# Patient Record
Sex: Female | Born: 1960 | Race: White | Hispanic: No | Marital: Married | Smoking: Former smoker
Health system: Southern US, Community
[De-identification: ages and names within clinical notes are randomized; demographics above are authoritative.]

## PROBLEM LIST (undated history)

## (undated) DIAGNOSIS — N390 Urinary tract infection, site not specified: Secondary | ICD-10-CM

## (undated) DIAGNOSIS — I639 Cerebral infarction, unspecified: Secondary | ICD-10-CM

## (undated) DIAGNOSIS — E559 Vitamin D deficiency, unspecified: Secondary | ICD-10-CM

## (undated) DIAGNOSIS — E1165 Type 2 diabetes mellitus with hyperglycemia: Secondary | ICD-10-CM

## (undated) DIAGNOSIS — I10 Essential (primary) hypertension: Secondary | ICD-10-CM

## (undated) DIAGNOSIS — E119 Type 2 diabetes mellitus without complications: Secondary | ICD-10-CM

## (undated) DIAGNOSIS — E785 Hyperlipidemia, unspecified: Secondary | ICD-10-CM

## (undated) DIAGNOSIS — I679 Cerebrovascular disease, unspecified: Secondary | ICD-10-CM

## (undated) HISTORY — PX: CHOLECYSTECTOMY: SHX55

## (undated) HISTORY — DX: Vitamin D deficiency, unspecified: E55.9

## (undated) HISTORY — DX: Type 2 diabetes mellitus without complications: E11.9

## (undated) HISTORY — DX: Cerebral infarction, unspecified: I63.9

---
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
## 2000-08-30 ENCOUNTER — Encounter: Admission: RE | Admit: 2000-08-30 | Discharge: 2000-11-28 | Payer: Self-pay

## 2005-07-26 DIAGNOSIS — Z8673 Personal history of transient ischemic attack (TIA), and cerebral infarction without residual deficits: Secondary | ICD-10-CM

## 2005-07-26 HISTORY — DX: Personal history of transient ischemic attack (TIA), and cerebral infarction without residual deficits: Z86.73

## 2005-09-27 ENCOUNTER — Ambulatory Visit: Payer: Self-pay | Admitting: Cardiology

## 2005-10-01 ENCOUNTER — Inpatient Hospital Stay (HOSPITAL_COMMUNITY)
Admission: RE | Admit: 2005-10-01 | Discharge: 2005-10-20 | Payer: Self-pay | Admitting: Physical Medicine & Rehabilitation

## 2005-10-01 ENCOUNTER — Ambulatory Visit: Payer: Self-pay | Admitting: Physical Medicine & Rehabilitation

## 2005-10-25 ENCOUNTER — Encounter (HOSPITAL_COMMUNITY)
Admission: RE | Admit: 2005-10-25 | Discharge: 2005-11-24 | Payer: Self-pay | Admitting: Physical Medicine & Rehabilitation

## 2005-11-19 ENCOUNTER — Encounter
Admission: RE | Admit: 2005-11-19 | Discharge: 2006-02-17 | Payer: Self-pay | Admitting: Physical Medicine & Rehabilitation

## 2005-11-19 ENCOUNTER — Ambulatory Visit: Payer: Self-pay | Admitting: Physical Medicine & Rehabilitation

## 2005-11-25 ENCOUNTER — Encounter (HOSPITAL_COMMUNITY)
Admission: RE | Admit: 2005-11-25 | Discharge: 2005-12-25 | Payer: Self-pay | Admitting: Physical Medicine & Rehabilitation

## 2005-12-29 ENCOUNTER — Encounter (HOSPITAL_COMMUNITY)
Admission: RE | Admit: 2005-12-29 | Discharge: 2006-01-28 | Payer: Self-pay | Admitting: Physical Medicine & Rehabilitation

## 2006-01-20 ENCOUNTER — Ambulatory Visit: Payer: Self-pay | Admitting: Physical Medicine & Rehabilitation

## 2006-02-01 ENCOUNTER — Encounter (HOSPITAL_COMMUNITY)
Admission: RE | Admit: 2006-02-01 | Discharge: 2006-03-03 | Payer: Self-pay | Admitting: Physical Medicine & Rehabilitation

## 2006-02-21 ENCOUNTER — Encounter
Admission: RE | Admit: 2006-02-21 | Discharge: 2006-02-21 | Payer: Self-pay | Admitting: Physical Medicine & Rehabilitation

## 2007-02-02 ENCOUNTER — Ambulatory Visit (HOSPITAL_COMMUNITY): Admission: RE | Admit: 2007-02-02 | Discharge: 2007-02-02 | Payer: Self-pay | Admitting: Orthopaedic Surgery

## 2007-02-06 ENCOUNTER — Encounter (HOSPITAL_COMMUNITY): Admission: RE | Admit: 2007-02-06 | Discharge: 2007-03-08 | Payer: Self-pay | Admitting: Orthopaedic Surgery

## 2007-03-21 ENCOUNTER — Encounter (HOSPITAL_COMMUNITY): Admission: RE | Admit: 2007-03-21 | Discharge: 2007-04-20 | Payer: Self-pay | Admitting: Orthopaedic Surgery

## 2007-09-21 ENCOUNTER — Ambulatory Visit: Payer: Self-pay | Admitting: Internal Medicine

## 2007-09-28 ENCOUNTER — Ambulatory Visit: Payer: Self-pay | Admitting: Internal Medicine

## 2007-09-28 ENCOUNTER — Ambulatory Visit (HOSPITAL_COMMUNITY): Admission: RE | Admit: 2007-09-28 | Discharge: 2007-09-28 | Payer: Self-pay | Admitting: Internal Medicine

## 2007-12-08 ENCOUNTER — Encounter
Admission: RE | Admit: 2007-12-08 | Discharge: 2008-03-07 | Payer: Self-pay | Admitting: Physical Medicine & Rehabilitation

## 2007-12-12 ENCOUNTER — Ambulatory Visit: Payer: Self-pay | Admitting: Physical Medicine & Rehabilitation

## 2008-01-15 ENCOUNTER — Ambulatory Visit: Payer: Self-pay | Admitting: Physical Medicine & Rehabilitation

## 2009-08-29 ENCOUNTER — Ambulatory Visit (HOSPITAL_COMMUNITY): Payer: Self-pay | Admitting: Oncology

## 2009-08-29 ENCOUNTER — Encounter (HOSPITAL_COMMUNITY): Admission: RE | Admit: 2009-08-29 | Discharge: 2009-09-28 | Payer: Self-pay | Admitting: Oncology

## 2009-09-02 ENCOUNTER — Ambulatory Visit (HOSPITAL_COMMUNITY): Admission: RE | Admit: 2009-09-02 | Discharge: 2009-09-02 | Payer: Self-pay | Admitting: Oncology

## 2010-10-15 LAB — IRON AND TIBC
Iron: 16 ug/dL — ABNORMAL LOW (ref 42–135)
UIBC: 414 ug/dL

## 2010-10-15 LAB — FERRITIN: Ferritin: 6 ng/mL — ABNORMAL LOW (ref 10–291)

## 2010-10-15 LAB — CBC: Platelets: 460 10*3/uL — ABNORMAL HIGH (ref 150–400)

## 2010-12-08 NOTE — Procedures (Signed)
Melanie Spencer, Melanie Spencer                  ACCOUNT NO.:  [id number]   MEDICAL RECORD NO.:  [id number]          PATIENT TYPE:  REC   LOCATION:  [facility]                         FACILITY:  [facility]   PHYSICIAN:  Erick Colace, M.D.DATE OF BIRTH:  October 09, 1960   DATE OF PROCEDURE:  DATE OF DISCHARGE:                               OPERATIVE REPORT   PROCEDURE:  Botulinum toxin injection, left pectoralis and left forearm  flexor muscles.   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   Spasticity only partially responsive to physical therapy and  medications.   DESCRIPTION OF PROCEDURE:  Informed consent was obtained after  describing risks and benefits of the procedure to the patient.  These  include bleeding, bruising, and infection.  She elected to proceed and  has given written consent.  The patient placed on exam table from a  reclined posture.  Nursing assistance, area over the left pectoralis.  Lateral aspect was marked and prepped with Betadine, entered with a 15-  mm needle electrode under EMG guidance.  Positive EMG activity obtained  followed by injection of 25 units of botulinum toxin into each of 5  sites.  Then, the right forearm was marked and prepped with Betadine,  entered with the 15-mm 26-gauge needle electrode and 25 units were  injected into each of 2 sites in the flexor carpi radialis, 3 sites in  the flexor digitorum sublimis and 2 sites in the flexor digitorum  profundus.  The patient tolerated the procedure well.  Post-injection  instructions given.   Return in 1 month for followup visit.  We will order OT for edema  reduction in left upper extremity as well as scapular mobilization of  the left shoulder.      Erick Colace, M.D.  Electronically Signed     AEK/MEDQ  D:  01/15/2008 13:35:24  T:  01/16/2008 03:56:02  Job:  [id number]

## 2010-12-08 NOTE — Assessment & Plan Note (Signed)
This is a 50 year old female with a prior history of diabetes and  tobacco abuse, admitted to [facility] on September 25, 2005 with acute  onset of left-sided weakness.  MRI showed a right MCA distribution  infarction.  Echocardiogram showed normal ejection fraction, no source  of embolism.  Carotid Dopplers show no significant stenosis.  She was  hospitalized at [facility] inpatient rehabilitation from October 01, 2005  through October 20, 2005.  She had her last visit with me January 24, 2006, at  which time we were doing some pre-driver assessment, and she was to  follow up with me in 4 months.  She unfortunately had lost her insurance  and therefore did not make her follow up visits in the interval time.  She has seen Dr. Hilda Lias from orthopedics who got her an air cast for  her left ankle instability; however, she continues to have some foot  drop with this, and has had some falls.  In addition, she has had some  left shoulder pain, mainly when she put a 5 pound weight in her hand and  tries to move her arm around.  She has had problems with left hand  swelling since her stroke, and this has really not improved much.  She  has finished out her PT and OT over a year ago.   Her left shoulder has about 2/10 pain.  She can walk 30-40 minutes at  time but slowly, and uses a cane if she is by herself or outside the  house.  She cannot walk on uneven surfaces.  She can climb steps with  rails very slowly.  She does drive, only daytime hours.   She needs some assistance with dressing, meal preparation and household  duties.   REVIEW OF SYSTEMS:  Positive for numbness and tingling in her feet and  limb swelling.   MEDICATIONS:  1. Gabapentin.  2. Plavix.  3. Iron.  4. Prilosec.  5. Actos.  6. Glimepiride.  7. Lovastatin.  8. Baby aspirin.  9. Altace.   SOCIAL HISTORY:  Married.  Accompanied by her husband.  She has applied  for disability; has not received it yet.   PHYSICAL  EXAMINATION:  VITAL SIGNS:  Her blood pressure is 161/71, pulse  78, respirations 20, O2 saturation 98% on room air.  GENERAL:  An obese female in no acute distress.  NEUROLOGIC:  She has lower facial droop on the left side, left central  seventh.  Her motor strength is 2- at the deltoid, 2- at the biceps.  She really works in a Investment banker, corporate pattern with some shoulder  retractions.  She has no active movement at the finger or wrist.  She  does have 2+ dorsal swelling of the hand and fingers.  In the lower  extremities, she has 3- hip flexion, knee extension and ankle  dorsiflexion, as well as plantar flexion.  Her ankle range of motion is  reduced.  Her gait is a steppage type gait with hip hiking.  Of note,  she does have some toe drag noted.   Her Ashworth scale is 3 in the finger and wrist flexors of the left  hand, as well as at the left biceps and left pectoralis.  She has some  clonus at the left ankle, but no contracture.  She has no contracture at  the hip or knee.  The right side has 5/5 strength with normal joint  range of motion.  Her sensation is intact  bilaterally to light touch.  Her left upper extremity has pain with passive overhead movement, but  only at end range.   IMPRESSION:  1. Left spastic hemiplegia due to cerebrovascular accident.  She has      spasticity affecting the left upper extremity at the finger and      wrist flexors, as well as the biceps as well as the left      pectoralis.  This is contributing to reduced range of motion,      abnormal gait pattern, where hand, wrist and arm are in the flexed      pattern during ambulation.  This also contributes to poor balance.  2. Hand swelling secondary to weakness and dependent positions,      instructed in retrograde massage.  3. Left foot drop due to her dorsiflexor weakness.   PLAN:  1. Will set her up for botulinum toxin injection, finger and wrist      flexors, as well as pectoralis on the left side.   At a later date,      will do left muscutaneous nerve block with phenol.  2. Will refer for left AFO at [facility] evaluation for custom versus off-      the-shelf model.  3. Consider functional electrical stimulation of the left upper      extremity, shoulder.  4. May need OT for edema reduction techniques, left upper extremity.   I will see her back for the injections.  Will need to get pre-approval.  This was discussed with both the patient and her husband.      Erick Colace, M.D.  Electronically Signed     AEK/MedQ  D:  12/12/2007 16:44:19  T:  12/12/2007 18:13:09  Job #:  [id number]   cc:   Corrie Mckusick, M.D.  Fax: [phone]   Melony Overly, PA   J. Darreld Mclean, M.D.  Fax: [phone]

## 2010-12-08 NOTE — Consult Note (Signed)
Melanie Spencer, Melanie Spencer                  ACCOUNT NO.:  [id number]   MEDICAL RECORD NO.:  [id number]          PATIENT TYPE:  AMB   LOCATION:  DAY                           FACILITY:  [facility]   PHYSICIAN:  R. Roetta Sessions, M.D. DATE OF BIRTH:  1960/10/02   DATE OF CONSULTATION:  DATE OF DISCHARGE:                                 CONSULTATION   REFERRING PHYSICIAN:  Patrica Duel, M.D.   REASON FOR CONSULTATION:  Iron-deficiency anemia.   HISTORY OF PRESENT ILLNESS:  Melanie Spencer is a 50 year old female. She  presents today for evaluation of iron deficiency anemia.  She was seen  by her primary care physician and was noted to have anemia with a  hemoglobin of 10.5, hematocrit of 35.2 and MCV of 81.7 on August 31, 2007. She had and iron of 21, a ferritin of 8 and URBC 391, TIBC 412 and  percent saturation 5.  She had a normal B12 and folate as well as  reticulocyte count.  She had a platelet count of 462. Her hemoglobin had  been 10.6 with a hematocrit of 34 back in November 2008.  She denies any  rectal bleeding or melena. Denies any history of abdominal pain.  She  did complete 3 stool cards through primary care office which were  negative per her report.  She does have a history of chronic GERD well  controlled on omeprazole 20 mg daily.  She had daily symptoms up until 2  months ago and was started on PPI.  She denies any dysphagia or  odynophagia.  Denies any anorexia or early satiety.  Her bowel movements  are normal, soft and brown.  Denies any constipation or diarrhea.  Her  weight has remained stable.  She does have heavy menses. Her last period  was last week. She has a 28-day cycle. She has about 4 days of  significant vaginal bleeding followed by approximately 3 days of  spotting.   PAST MEDICAL AND SURGICAL HISTORY:  Diabetes mellitus, hypertension,  hypercholesterolemia, CVA 2 years ago with left-sided deficits,  cholecystectomy in 1991, menorrhagia.   CURRENT MEDICATIONS:  1.  Repliva 21/7 once daily.  2. Omeprazole 20 mg daily.  3. Actos plus 15/850 mg daily.  4. Ramipril 2.5 mg daily.  5. Glimepiride 4 mg daily.  6. Altace 5 mg daily.  7. Aspirin 81 mg daily.  8. Prilosec 75 mg daily.  9. Lovastatin 60 mg daily.   ALLERGIES:  No known drug allergies.   FAMILY HISTORY:  There is no known family history of colorectal  carcinoma, lower chronic GI problems.  Mother deceased at 73 secondary  to CHF.  Father deceased at age 63 secondary to lung carcinoma.  She has  5 healthy siblings.   SOCIAL HISTORY:  Melanie Spencer is married. She was previously an Arts development officer. She has a 21-pack-year history of tobacco use, quit in 2007.  She  denies any alcohol.   HPI otherwise negative.   PHYSICAL EXAMINATION:  VITAL SIGNS:  Weight to 261 pounds, height 66-1/2  inches, temperature 97.9, blood  pressure 122/80 and pulse 80.  GENERAL:  Melanie Spencer is an obese Caucasian female who is alert, oriented,  pleasant and cooperative.  HEENT:  Sclerae clear, nonicteric.  Conjunctiva pink.  Her bilateral  eyes deviate laterally. Oropharynx pink and moist without lesions. She  does have upper dentures intact.  NECK:  Supple without mass or thyromegaly.  CHEST:  Heart regular rate and rhythm, normal S1, S2 without murmurs,  clicks, rubs or gallops.  LUNGS:  Clear to auscultation bilaterally.  ABDOMEN:  Protuberant with positive bowel sounds x4.  No bruits  auscultated.  Soft, nontender, nondistended without palpable mass or  hepatosplenomegaly.  No rebound tenderness or guarding.  EXTREMITIES:  Without clubbing.  She does have trace lower extremity  edema to bilateral ankles.   LABORATORY STUDIES:  August 31, 2007 she had normal LFTs and a  hemoglobin A1c of 8.7 and see HPI.   IMPRESSION:  Melanie Spencer is a 50 year old female with iron deficiency  anemia and hemoccult negative stool.  I suspect her anemia could be  related to her menorrhagia.  However, she does need colonoscopy  to rule  out colorectal carcinoma.   She has a 2 month history of gastroesophageal reflux disease which is  well controlled on proton pump inhibitor.   PLAN:  1. Colonoscopy with Dr. Jena Gauss in the near future. I have discussed the      procedure including the risks and benefits including but not      limited to bleeding, infection, perforation, and drug reaction. She      agrees with plan and consent will be obtained. Given the fact she      is on Plavix and aspirin, she is at increased risk for bleeding and      this has been discussed with her and Dr. Jena Gauss; however we feel      that the risk of CVA by stopping Plavix is more significant.  2. She will take half her glimepiride and Actos the day prior to the      procedure.  3. Will hemoccult stools x3.  4. Continue omeprazole 20 mg daily.  5. She is to hold her iron for 7 days prior to the procedure.   Thank you Melony Overly, PA-C and Dr. Nobie Putnam for allowing [place] to  participate in the care of Melanie Spencer.      Lorenza Burton, N.P.      Jonathon Bellows, M.D.  Electronically Signed    KJ/MEDQ  D:  09/21/2007  T:  09/22/2007  Job:  [id number]   cc:   Patrica Duel, M.D.  Fax: [phone]

## 2010-12-08 NOTE — Op Note (Signed)
NAMELYNNA, ZAMORANO                  ACCOUNT NO.:  [id number]   MEDICAL RECORD NO.:  [id number]          PATIENT TYPE:  AMB   LOCATION:  DAY                           FACILITY:  [facility]   PHYSICIAN:  R. Roetta Sessions, M.D. DATE OF BIRTH:  [birth year]   DATE OF PROCEDURE:  DATE OF DISCHARGE:                               OPERATIVE REPORT   PROCEDURE:  Diagnostic ileocolonoscopy.   INDICATIONS FOR PROCEDURE:  A 50 year old lady with iron-deficiency  anemia in the absence of obvious GI bleeding or any other GI symptoms.   In fact, she is Hemoccult negative.  She has menorrhagia.   She does have a history of GERD, but those symptoms have pretty well  been squelched with omeprazole 20 mg orally daily.  She does take  aspirin and Plavix.   Colonoscopy is now being done to further evaluate iron-deficiency  anemia.  The risks, benefits, alternatives and limitations have been  reviewed with Ms. Hatton and her questions were answered.  She is  agreeable.  Please see the documentation in the medical record for the  procedure note.  Her O2 saturation, blood pressure, pulse, and  respirations were monitored throughout the entire procedure.   ANESTHESIA:  Conscious sedation, Versed and 7 mg IV Demerol 100 mg IV in  divided doses.   INSTRUMENT:  Pentax video chip system.   Digital rectal exam revealed no abnormalities.   ENDOSCOPIC FINDINGS:  Prep was good in the colon.  Colonic mucosa was  surveyed from the rectosigmoid junction through the left transverse and  right colon, the appendiceal orifice, ileocecal valve and cecum.  These  structures were well photographed.  They were well and photographed for  the record.  Terminal ileum was intubated 10 cm and photographed.  From  this level the scope was withdrawn.  All previously mentioned mucosal  surfaces were again seen.  The colonic mucosa as well as the terminal  ileal mucosa appeared entirely normal.  The scope was pulled down the  rectum.   I attempted a retroflex and was unable to do so.  The rectal  vault was small.  For the same reason I was able to see the rectal  mucosa very well en face and it appeared to be normal.  The patient  tolerated the procedure well __________.   IMPRESSION:  Normal rectum, colon, terminal ileum.   RECOMMENDATIONS:  Follow up with Dr. Nobie Putnam.  No further GI evaluation  warranted at this time unless she were to develop new GI symptoms or  signs of GI bleeding.      Jonathon Bellows, M.D.  Electronically Signed     RMR/MEDQ  D:  09/28/2007  T:  09/28/2007  Job:  [id number]

## 2010-12-11 NOTE — H&P (Signed)
NAMESEVILLA, Melanie Spencer                  ACCOUNT NO.:  [id number]   MEDICAL RECORD NO.:  [id number]          PATIENT TYPE:  IPS   LOCATION:  4001                         FACILITY:  [facility]   PHYSICIAN:  Erick Colace, M.D.DATE OF BIRTH:  [birth year]   DATE OF ADMISSION:  10/01/2005  DATE OF DISCHARGE:                                HISTORY & PHYSICAL   REASON FOR ADMISSION:  Decline in functional abilities following stroke.   HISTORY:  A 50 year old female with past history of diabetes and tobacco  abuse admitted to [facility] September 25, 2005, with left-sided weakness,  speech difficulties, and dizziness. An MRI of the brain demonstrated  multiple acute infarcts of the right temporal and right frontal lobe in the  area of the right MCA distribution, questionable embolic. Cardiac  echocardiogram showed mild LVH, ejection fraction of 60-65% with a trace of  tricuspid valve regurgitation. Carotid Dopplers without ICA stenosis. The  patient started on aspirin and Plavix for CVA prophylaxis.   REVIEW OF SYSTEMS:  Positive for reflux, diarrhea, incontinence of bladder,  weakness, numbness, and anxiety.   PAST HISTORY:  1.  Morbidly obese.  2.  Diabetes type 2 with diabetic neuropathy.  3.  Amblyopia bilaterally.  4.  She had a cholecystectomy.   FAMILY HISTORY:  CAD, lung cancer, and diabetes.   SOCIAL HISTORY:  Married, unemployed. Tobacco:  One-and-a-half packs per  day. No ethanol.   FUNCTIONAL HISTORY:  Independent prior to admission.   FUNCTIONAL STATUS:  Currently impaired ADLs and mobility.   MEDICATIONS:  1.  Glucovance 2.5/500.  2.  Ibuprofen p.r.n.  3.  Lipitor 10 mg p.o. daily p.r.n.  4.  Plavix 75 mg p.o. daily.  5.  Aspirin 81 mg.   The patient has a hemoglobin A1c of 11.6. Last white count of 9.2 with  hemoglobin 15.2. Chest x-ray showed no acute disease.   EXAMINATION:  GENERAL:  Obese female with a left facial droop, no acute  distress. 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  Otherwise, extraocular movements are intact. She has left neglect on double  simultaneous confrontation testing.  NECK:  Supple without adenopathy.  RESPIRATORY:  Effort is good. Lungs are clear.  HEART:  Regular rate and rhythm. No rubs, murmurs, or extra sounds.  ABDOMEN:  Positive bowel sounds, soft, nontender to palpation.  EXTREMITIES:  No clubbing, cyanosis, or edema. She has decreased sensation  in the left hand but intact sensation to light touch in the left foot.  NEUROLOGIC:  Her mood and affect are flat. Orientation x3. Speech is mildly  dysarthric. Motor strength testing is 5/5 in the right deltoid, biceps,  triceps, finger flexor, hip flexor, quadriceps, TA, gastroc. On the left  side 0/5 deltoids, biceps, triceps, finger flexors, and 2- in the hip  flexor, quad, TA, and gastroc.   IMPRESSION:  1.  Functional deficits due to right MCA infarct with left facial weakness      and central VII, mild dysphagia, left hemiparesis, and left hemisensory      deficits. Will start  PT for mobility, OT for ADLs, speech for swallowing      and dysarthria, and aspirin and Plavix for secondary stroke prevention.  2.  Pain management is with Vicodin for headaches.  3.  Deep venous thrombosis prophylaxis. Add subcu Lovenox.  4.  Diabetes mellitus type 2. Monitor. Check a.c. and h.s. CBGs. Rehab      nursing to follow with this.  5.  Dyslipidemia, on Lipitor.  6.  Questionable dysesthesias left side. Monitor. May need to add Neurontin.   Estimated length of stay is 2-3 weeks. The patient is a good rehab  candidate. Prognosis for functional improvement for left lower extremity is  good, for left upper extremity is guarded.      Erick Colace, M.D.  Electronically Signed     AEK/MEDQ  D:  10/01/2005  T:  10/01/2005  Job:  [id number]   cc:   Dr. [facility]

## 2010-12-11 NOTE — Assessment & Plan Note (Signed)
DATE OF LAST VISIT:  November 22, 2005.   HISTORY:  A 50 year old female with prior history of diabetes, tobacco  abuse, and hypertension, had a right MCA distribution stroke.  Was at  inpatient rehab, outpatient rehab.  She has been out of work since her  stroke September 23, 2005.  She was denied Medicaid application.   She is able to walk 45 minutes at a time.  She does not drive but would like  to.  She is able to climb steps with a cane, able to do her ADLs.   PHYSICAL EXAMINATION:  VITAL SIGNS: Blood pressure 115/62, pulse 70,  respiratory rate 15, O2 saturation 99% on room air.  GENERAL: No acute distress.  Mood and affect appropriate.  HEENT:  She does have extraocular muscle weakness in the left medial rectus.  She has double vision on lateral gaze.  She has visual fields intact to  confrontation testing.  NEUROLOGIC:  She has mildly reduced sensation in the left upper extremity  compared to the right.  She has motor strength rated at 2- at the left  deltoid, biceps, triceps, finger flexors and extensors, and 3- at the hip  flexor, knee extensor, ankle dorsiflexion, and plantar flexors.  Gait is either with steppage or with trip induction depending on which  compensatory status she utilizes.  No toe drag or knee instability noted.   IMPRESSION:  Spastic hemiplegia, upper extremity greater than lower  extremity following right middle cerebral artery distribution  cerebrovascular accident.  Appears to have cleared with her neglect.   PLAN:  1.  Will have OT do TVPS (Test of Visual Perceptual Skills) and FAX results      to me.  2.  She does have some increasing tone, left hand.  Continue using orthotic      wrist-hand to minimize contracture. Consider Zanaflex if not responsive      to physical modalities and reserve Botox if Zanaflex does not prove      helpful.  3.  I will see her back in approximately 4 months.      Erick Colace, M.D.  Electronically Signed     AEK/MedQ  D:  01/24/2006 17:30:30  T:  01/24/2006 20:35:03  Job #:  [id number]   cc:   Assunta Found, M.D.  [phone], ATTN: Aggie Cosier, Demetrius Charity.A.

## 2010-12-11 NOTE — Discharge Summary (Signed)
NAMESYRENITY, KLEPACKI                  ACCOUNT NO.:  [id number]   MEDICAL RECORD NO.:  [id number]          PATIENT TYPE:  IPS   LOCATION:  4001                         FACILITY:  [facility]   PHYSICIAN:  Greg Cutter, P.A. DATE OF BIRTH:  08/29/1960   DATE OF ADMISSION:  10/01/2005  DATE OF DISCHARGE:  10/20/2005                                 DISCHARGE SUMMARY   DISCHARGE DIAGNOSES:  1.  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  2.  Hypertension.  3.  Dyslipidemia.  4.  Diabetes mellitus type 2.   HISTORY OF PRESENT ILLNESS:  Ms. Safi is a 50 year old female with history  of diabetes, tobacco use, admitted to [facility] March 3 with onset  of left-sided weakness and speech difficulties as well as dizziness.  MRI of  brain done showed multiple acute infarcts, right temporal, right frontal  lobe in area of right MCA distribution with question of embolic event.  Cardiac echo done showed mild LVH, EF of 60-65% with trace PVR.  Carotid  Dopplers done showed no ICA stenosis.  The patient was started on aspirin  and Plavix for CVA prophylaxis, was discharged to home; however, patient and  patient's family are having difficulty managing the patient at home.  Rehab  was consulted for progressive therapies to help with mobility and self-care.   PAST MEDICAL HISTORY:  1.  DM type 2 with diabetic neuropathy.  2.  Cholecystectomy.  3.  Bilateral amblyopia.  4.  Morbid obesity.   ALLERGIES:  No known drug allergies.   FAMILY HISTORY:  Positive for coronary artery disease, lung cancer,  diabetes.   SOCIAL HISTORY:  The patient is married, unemployed past four months.  Was  independent and driving prior to admission.  She smokes 1-1/2 packs per day  of tobacco.  Does not use any alcohol.   HOSPITAL COURSE:  Ms. Avrianna Smart was admitted to rehab on October 01, 2005, for  inpatient therapies to consist  of PT/OT daily.  Past admission subcu Lovenox  was added for DVT prophylaxis.  Aspirin and Plavix were continued for CVA  prophylaxis.  The patient was maintained on Lipitor for dyslipidemia.  Diabetes was monitored with a.c. and h.s. CBG checks and blood sugars noted  to be elevated.  Glucophage was increased to 850 mg p.o. b.i.d. and  glipizide was increased to 5 mg b.i.d. with blood sugars showing much  improvement.  At time of discharge, blood sugars ranging from 90s to 120s  with some highs in the 140s-150s range.  Blood pressures have been variable.  Altace was added and continues to be adjusted.  Blood pressures at the time  of discharge ranging from 130s-150s systolic, 70s-80s diastolic.  Additional  dose of Altace 5 mg p.o. was added to help with tighter blood pressure  control.  Labs checked past admission include check of CBC revealing  hemoglobin 14.1, hematocrit 41.0, white count 8.3, platelets of 316.  Check  of electrolytes revealed sodium 138, potassium 3.6, chloride  104, CO2 24,  BUN 16, creatinine 0.6, glucose 195.  LFTs reveal albumin 3.2, AST 12, ALT  12, alkaline phosphatase 74, total bilirubin 0.4, total protein 6.9.  UA/UC  done past admission showed no growth.  The patient has had issues with some  frequency, urgency.  She has also had complaints of neuropathic pain, and  Elavil was added and increased to 25 mg p.o. q.h.s.  Initially secondary to  the frequency symptoms, the patient was treated with Keflex for five days.   The patient has been participating in therapy.  Mood has been stable.  During her stay in rehab the patient has progressed along to being minimum  assist for transfers.  She has difficulty clearing left foot and maintaining  weight shifting on left side of the body.  She is able to ambulate with  minimum assist of two for 80 feet with short-based quad cane.  The patient  continues with decreased endurance and problems weight-shifting limiting her   mobility.  She is currently at minimum assist to attend to communication and  attend activities on left side.  She shows increased anticipatory awareness  of verbal problems.  The patient shows increase in focus of usage of left  upper extremity with bathing and dressing.  She is minimum to maximum assist  for ADLs.  Left resting hand strength was ordered to help prevent left hand  contracture.  On October 19, 2005, the patient is discharged to home.   DISCHARGE MEDICATIONS:  1.  Coated aspirin 81 mg a day.  2.  Elavil 25 mg q.h.s.  3.  Altace 5 mg in the a.m., 2.5 mg in p.m.  4.  Lipitor 20 mg p.o. q.h.s.  5.  Plavix 75 mg a day.  6.  Prilosec OTC one per day.  7.  Diabeta 5 mg b.i.d.  8.  Glucophage 850 mg b.i.d.   DIET:  Diabetic diet.   ACTIVITY:  As tolerated with supervision.   FOLLOW-UP:  Patient to follow up with Dr. Wynn Banker April 30 at 1:10.  Follow up with Dr. Phillips Odor for routine check.  Follow up with Dr. Virgina Organ  for protocol.      Greg Cutter, P.A.     PP/MEDQ  D:  10/19/2005  T:  10/21/2005  Job:  [id number]   cc:   Corrie Mckusick, M.D.  Fax: [phone]   Dr. Virgina Organ

## 2010-12-11 NOTE — Assessment & Plan Note (Signed)
HISTORY:  50 year old female.  Prior history of diabetes, tobacco abuse  admitted to [facility] September 25, 2005 onset of left-sided weakness.  MRI demonstrated right MCA distribution infarct.  Echocardiogram showed mild  LVH with a normal ejection fraction.  Carotid Dopplers showed no IC  stenosis.  She initially was discharged to home with family assist but  because of difficulties in family caring for her functional needs she was  brought into the rehabilitation center on October 01, 2005 at [facility] and  was discharged home on October 20, 2005.  She had to have adjustment of blood  pressure as well as diabetes medications.  She got to the point of  ambulating with short-based quad cane 80 feet with assistance of two people.  She was min to max assist with ADLs, min for the uppers and max for the  lowers.   She is currently independent with her upper extremity ADLs.  She is  requiring what sounds like min assist for her lowers, particularly with her  shoes.   She has had two falls but these occurred within the first week of her  discharge from rehabilitation and none since.  She is getting some  outpatient therapy at [facility].  She has been fitted with a left  arm sling for when she is ambulating.   She is starting some light meal prep at home.  She has not been able to work  since her stroke September 23, 2005.  She lives with her husband who works during  the day but her mother-in-law is with her during the day when he is at work.  She has not been left home alone.   Blood pressure 109/67, pulse 90, respiratory rate 17, O2 saturation 98% room  air.  General:  No acute distress.  Mood and affect appropriate.  Back has  no tenderness to palpation.  Her shoulder has no tenderness to palpation.  She has 2- at the left biceps, 0 at the finger flexors and extensors, 2- at  the deltoids.  She has intact sensation in the upper extremities.  Her lower  extremities are 4/5 in the  left hip flexor, knee extensor, and ankle  dorsiflexor.  Her gait is with a cane with no evidence of toe drag or knee  instability.  She does have decreased ankle dorsiflexion during swing phase,  but once again, not dragging.   She has increased base of support in the shortened step at length.   IMPRESSION:  1.  Left spastic hemiplegia secondary to right middle cerebral artery      infarct.  She made some improvements in her functional status since      discharge and since going to outpatient therapy.  I think she is going      to need several months of therapy and anticipated good recovery of      walking function; however, prognosis is more guarded for her left upper      extremity function.  I definitely think that she is going to be disabled      for at least one year and have discussed this with patient and family.   I will see her back in one month.  1.  Follow up with primary care.  2.  Husband is requesting samples.  Really do not have anything that she is      on other than some Aciphex samples      which she can substitute for her  Zantac.  Otherwise, she will need to      get these through her primary physician.  I will see her back in one      month.      Erick Colace, M.D.  Electronically Signed     AEK/MedQ  D:  11/22/2005 14:46:49  T:  11/23/2005 09:36:49  Job #:  [id number]   cc:   Corrie Mckusick, M.D.  Fax: [phone]   Aggie Cosier P.A.  office of Dr. Phillips Odor

## 2011-02-16 ENCOUNTER — Other Ambulatory Visit (HOSPITAL_COMMUNITY)
Admission: RE | Admit: 2011-02-16 | Discharge: 2011-02-16 | Disposition: A | Discharge status: Home / Self Care | Payer: PRIVATE HEALTH INSURANCE | Source: Ambulatory Visit | Attending: Obstetrics & Gynecology | Admitting: Obstetrics & Gynecology

## 2011-02-16 DIAGNOSIS — Z01419 Encounter for gynecological examination (general) (routine) without abnormal findings: Secondary | ICD-10-CM | POA: Insufficient documentation

## 2011-02-18 ENCOUNTER — Other Ambulatory Visit: Payer: Self-pay | Admitting: Obstetrics & Gynecology

## 2011-02-18 DIAGNOSIS — Z139 Encounter for screening, unspecified: Secondary | ICD-10-CM

## 2011-03-01 ENCOUNTER — Ambulatory Visit (HOSPITAL_COMMUNITY)
Admission: RE | Admit: 2011-03-01 | Discharge: 2011-03-01 | Disposition: A | Discharge status: Home / Self Care | Payer: PRIVATE HEALTH INSURANCE | Source: Ambulatory Visit | Attending: Obstetrics & Gynecology | Admitting: Obstetrics & Gynecology

## 2011-03-01 DIAGNOSIS — Z139 Encounter for screening, unspecified: Secondary | ICD-10-CM

## 2011-03-01 DIAGNOSIS — Z1231 Encounter for screening mammogram for malignant neoplasm of breast: Secondary | ICD-10-CM | POA: Insufficient documentation

## 2012-02-01 ENCOUNTER — Other Ambulatory Visit (HOSPITAL_COMMUNITY): Payer: Self-pay | Admitting: *Deleted

## 2012-02-01 DIAGNOSIS — Z139 Encounter for screening, unspecified: Secondary | ICD-10-CM

## 2012-03-09 ENCOUNTER — Ambulatory Visit (HOSPITAL_COMMUNITY)
Admission: RE | Admit: 2012-03-09 | Discharge: 2012-03-09 | Disposition: A | Discharge status: Home / Self Care | Payer: Medicare Other | Source: Ambulatory Visit | Attending: Obstetrics & Gynecology | Admitting: Obstetrics & Gynecology

## 2012-03-09 DIAGNOSIS — Z139 Encounter for screening, unspecified: Secondary | ICD-10-CM

## 2012-03-09 DIAGNOSIS — Z1231 Encounter for screening mammogram for malignant neoplasm of breast: Secondary | ICD-10-CM | POA: Insufficient documentation

## 2013-04-13 ENCOUNTER — Other Ambulatory Visit: Payer: Self-pay | Admitting: Obstetrics & Gynecology

## 2013-04-13 DIAGNOSIS — Z09 Encounter for follow-up examination after completed treatment for conditions other than malignant neoplasm: Secondary | ICD-10-CM

## 2013-04-16 ENCOUNTER — Ambulatory Visit (HOSPITAL_COMMUNITY)
Admission: RE | Admit: 2013-04-16 | Discharge: 2013-04-16 | Disposition: A | Discharge status: Home / Self Care | Payer: Medicare Other | Source: Ambulatory Visit | Attending: Obstetrics & Gynecology | Admitting: Obstetrics & Gynecology

## 2013-04-16 DIAGNOSIS — Z1231 Encounter for screening mammogram for malignant neoplasm of breast: Secondary | ICD-10-CM | POA: Insufficient documentation

## 2013-04-16 DIAGNOSIS — Z09 Encounter for follow-up examination after completed treatment for conditions other than malignant neoplasm: Secondary | ICD-10-CM

## 2014-04-03 ENCOUNTER — Other Ambulatory Visit (HOSPITAL_COMMUNITY): Payer: Self-pay | Admitting: Family Medicine

## 2014-04-03 DIAGNOSIS — Z1231 Encounter for screening mammogram for malignant neoplasm of breast: Secondary | ICD-10-CM

## 2014-04-29 ENCOUNTER — Ambulatory Visit (HOSPITAL_COMMUNITY): Payer: Medicare Other

## 2014-05-01 ENCOUNTER — Other Ambulatory Visit: Payer: Self-pay | Admitting: Obstetrics & Gynecology

## 2014-05-01 DIAGNOSIS — Z1231 Encounter for screening mammogram for malignant neoplasm of breast: Secondary | ICD-10-CM

## 2014-05-06 ENCOUNTER — Ambulatory Visit (HOSPITAL_COMMUNITY)
Admission: RE | Admit: 2014-05-06 | Discharge: 2014-05-06 | Disposition: A | Discharge status: Home / Self Care | Payer: Medicare Other | Source: Ambulatory Visit | Attending: Obstetrics & Gynecology | Admitting: Obstetrics & Gynecology

## 2014-05-06 DIAGNOSIS — Z1231 Encounter for screening mammogram for malignant neoplasm of breast: Secondary | ICD-10-CM | POA: Insufficient documentation

## 2015-04-25 ENCOUNTER — Encounter: Payer: Self-pay | Admitting: Vascular Surgery

## 2015-04-28 ENCOUNTER — Encounter: Payer: Medicare Other | Admitting: Surgery

## 2015-04-29 ENCOUNTER — Ambulatory Visit (INDEPENDENT_AMBULATORY_CARE_PROVIDER_SITE_OTHER): Payer: Medicare Other | Admitting: Vascular Surgery

## 2015-04-29 ENCOUNTER — Encounter: Payer: Self-pay | Admitting: Vascular Surgery

## 2015-04-29 VITALS — BP 170/76 | HR 62 | Ht 66.5 in | Wt 258.0 lb

## 2015-04-29 DIAGNOSIS — I739 Peripheral vascular disease, unspecified: Secondary | ICD-10-CM

## 2015-04-29 NOTE — Progress Notes (Signed)
Patient name: Melanie Spencer MRN: [id number] DOB: July 28, 1960 Sex: female   Referred by: Loney Hering  Reason for referral:  Chief Complaint  Patient presents with  . New Evaluation    eval abnormal abi's, claudication     HISTORY OF PRESENT ILLNESS:  patient is seen today for discussion of lower extremity arterial insufficiency. She is a very pleasant 54 year old female with a long history of insulin-dependent diabetes. She had a major stroke at age 79 leaving her with a completely paralyzed left arm and left leg weakness. She has made some improvement over time. She does not have any claudication type history. Does have severe neuropathy in both lower extremities and has had improvement with Neurontin regarding her symptoms. She does have chronic swelling in both lower extremities and does have chronic superficial ulceration in her distal calves. No foot ulcerations. She quit smoking in 2007.  Past Medical History  Diagnosis Date  . Stroke (HCC)   . Diabetes mellitus without complication [facility])     Past Surgical History  Procedure Laterality Date  . Cholecystectomy      Social History   Social History  . Marital Status: Married    Spouse Name: N/A  . Number of Children: N/A  . Years of Education: N/A   Occupational History  . Not on file.   Social History Main Topics  . Smoking status: Former Smoker    Quit date: 07/26/2005  . Smokeless tobacco: Not on file  . Alcohol Use: No  . Drug Use: No  . Sexual Activity: Not on file   Other Topics Concern  . Not on file   Social History Narrative  . No narrative on file    Family History  Problem Relation Age of Onset  . Heart disease Mother   . Diabetes Mother   . Hyperlipidemia Mother   . Hypertension Mother   . Cancer Father   . Hypertension Sister     Allergies as of 04/29/2015  . (Not on File)    No current outpatient prescriptions on file prior to visit.   No current facility-administered medications on  file prior to visit.     REVIEW OF SYSTEMS:  Positives indicated with an "X"  CARDIOVASCULAR:   chest pain    chest pressure    palpitations    orthopnea    dyspnea on exertion    claudication    rest pain    DVT    phlebitis PULMONARY:    productive cough    asthma   [x ] wheezing NEUROLOGIC:   [x ] weakness  [x ] paresthesias   aphasia   amaurosis   dizziness HEMATOLOGIC:    bleeding problems    clotting disorders MUSCULOSKELETAL:   joint pain    joint swelling GASTROINTESTINAL:   blood in stool    hematemesis GENITOURINARY:    dysuria    hematuria PSYCHIATRIC:   history of major depression INTEGUMENTARY:   rashes   ulcers CONSTITUTIONAL:   fever    chills  PHYSICAL EXAMINATION:  General: The patient is a well-nourished female, in no acute distress. Vital signs are BP 170/76 mmHg  Pulse 62  Ht 5' 6.5" (1.689 m)  Wt 258 lb (117.028 kg)  BMI 41.02 kg/m2  SpO2 98%  LMP 03/02/2012 Pulmonary: There is a good  air exchange bilaterally without wheezing or rales. Abdomen: Soft and non-tender with normal pitch bowel sounds. Moderate obesity Musculoskeletal: There are no major deformities.  There is no significant extremity pain. Neurologic: No focal weakness or paresthesias are detected, Skin:  She does have diffuse erythema and thickening in the skin on both lower extremities from her knees down to her ankles. She has a superficial ulcerations over this area as well. No ulcerations over her feet Psychiatric: The patient has normal affect. Cardiovascular: There is a regular rate and rhythm without significant murmur appreciated.  pulse status : 2+ posterior tibial pulses bilaterally and 1+ results pedis pulse bilaterally. 2+ popliteal pulses bilaterally 2+ radial pulses bilaterally  Carotid arteries without bruits bilaterally   Vascular Lab Studies:  I have her noninvasive studies from [facility]  for review from 04/03/2015. This shows normal ankle arm index bilaterally with some dampening of her waveforms in her toes bilaterally  Impression and Plan:   small vessel disease in her digital vessels with no evidence of a tibial vessel occlusive disease with normal posterior tibial pulses bilaterally. She does have edema causing skin changes and the cracking of her skin. I did write her a prescription for Silvadene ointment for treatment of these superficial ulcerations and explain the importance of elevation and compression to keep the swelling out of this. Explain that she is not in any risk for limb loss related to arterial insufficiency and the digital vessels. She was relieved with this discussion will see [place] again on as-needed basis    Janicia Monterrosa [facility] [place] Office: [phone]

## 2015-04-30 ENCOUNTER — Encounter: Payer: Self-pay | Admitting: Family Medicine

## 2015-05-05 ENCOUNTER — Other Ambulatory Visit (HOSPITAL_COMMUNITY): Payer: Self-pay | Admitting: Family Medicine

## 2015-05-05 ENCOUNTER — Ambulatory Visit (HOSPITAL_COMMUNITY)
Admission: RE | Admit: 2015-05-05 | Discharge: 2015-05-05 | Disposition: A | Discharge status: Home / Self Care | Payer: Medicare Other | Source: Ambulatory Visit | Attending: Family Medicine | Admitting: Family Medicine

## 2015-05-05 DIAGNOSIS — Z1231 Encounter for screening mammogram for malignant neoplasm of breast: Secondary | ICD-10-CM

## 2016-05-10 ENCOUNTER — Other Ambulatory Visit: Payer: Self-pay | Admitting: Obstetrics & Gynecology

## 2016-05-10 DIAGNOSIS — Z1231 Encounter for screening mammogram for malignant neoplasm of breast: Secondary | ICD-10-CM

## 2016-05-17 ENCOUNTER — Ambulatory Visit (HOSPITAL_COMMUNITY)
Admission: RE | Admit: 2016-05-17 | Discharge: 2016-05-17 | Disposition: A | Discharge status: Home / Self Care | Payer: Medicare Other | Source: Ambulatory Visit | Attending: Obstetrics & Gynecology | Admitting: Obstetrics & Gynecology

## 2016-05-17 DIAGNOSIS — Z1231 Encounter for screening mammogram for malignant neoplasm of breast: Secondary | ICD-10-CM | POA: Insufficient documentation

## 2016-10-11 ENCOUNTER — Other Ambulatory Visit: Payer: Self-pay | Admitting: Vascular Surgery

## 2017-04-20 ENCOUNTER — Other Ambulatory Visit (HOSPITAL_COMMUNITY): Payer: Self-pay | Admitting: Internal Medicine

## 2017-04-20 DIAGNOSIS — Z1231 Encounter for screening mammogram for malignant neoplasm of breast: Secondary | ICD-10-CM

## 2017-05-23 ENCOUNTER — Encounter (HOSPITAL_COMMUNITY): Payer: Self-pay

## 2017-05-23 ENCOUNTER — Ambulatory Visit (HOSPITAL_COMMUNITY)
Admission: RE | Admit: 2017-05-23 | Discharge: 2017-05-23 | Disposition: A | Discharge status: Home / Self Care | Payer: Medicare Other | Source: Ambulatory Visit | Attending: Internal Medicine | Admitting: Internal Medicine

## 2017-05-23 DIAGNOSIS — Z1231 Encounter for screening mammogram for malignant neoplasm of breast: Secondary | ICD-10-CM | POA: Insufficient documentation

## 2017-09-06 DIAGNOSIS — H25813 Combined forms of age-related cataract, bilateral: Secondary | ICD-10-CM | POA: Diagnosis not present

## 2017-09-06 DIAGNOSIS — E11311 Type 2 diabetes mellitus with unspecified diabetic retinopathy with macular edema: Secondary | ICD-10-CM | POA: Diagnosis not present

## 2017-09-26 DIAGNOSIS — I10 Essential (primary) hypertension: Secondary | ICD-10-CM | POA: Diagnosis not present

## 2017-09-26 DIAGNOSIS — E119 Type 2 diabetes mellitus without complications: Secondary | ICD-10-CM | POA: Diagnosis not present

## 2017-09-26 DIAGNOSIS — E785 Hyperlipidemia, unspecified: Secondary | ICD-10-CM | POA: Diagnosis not present

## 2017-10-27 DIAGNOSIS — I10 Essential (primary) hypertension: Secondary | ICD-10-CM | POA: Diagnosis not present

## 2017-10-27 DIAGNOSIS — E119 Type 2 diabetes mellitus without complications: Secondary | ICD-10-CM | POA: Diagnosis not present

## 2018-01-09 DIAGNOSIS — I10 Essential (primary) hypertension: Secondary | ICD-10-CM | POA: Diagnosis not present

## 2018-01-09 DIAGNOSIS — E119 Type 2 diabetes mellitus without complications: Secondary | ICD-10-CM | POA: Diagnosis not present

## 2018-03-06 DIAGNOSIS — H25813 Combined forms of age-related cataract, bilateral: Secondary | ICD-10-CM | POA: Diagnosis not present

## 2018-03-06 DIAGNOSIS — H5017 Alternating exotropia with V pattern: Secondary | ICD-10-CM | POA: Diagnosis not present

## 2018-03-06 DIAGNOSIS — E119 Type 2 diabetes mellitus without complications: Secondary | ICD-10-CM | POA: Diagnosis not present

## 2018-03-06 DIAGNOSIS — E113393 Type 2 diabetes mellitus with moderate nonproliferative diabetic retinopathy without macular edema, bilateral: Secondary | ICD-10-CM | POA: Diagnosis not present

## 2018-03-22 DIAGNOSIS — E119 Type 2 diabetes mellitus without complications: Secondary | ICD-10-CM | POA: Diagnosis not present

## 2018-03-22 DIAGNOSIS — I10 Essential (primary) hypertension: Secondary | ICD-10-CM | POA: Diagnosis not present

## 2018-03-22 DIAGNOSIS — E785 Hyperlipidemia, unspecified: Secondary | ICD-10-CM | POA: Diagnosis not present

## 2018-05-11 ENCOUNTER — Other Ambulatory Visit (HOSPITAL_COMMUNITY): Payer: Self-pay | Admitting: Internal Medicine

## 2018-05-11 DIAGNOSIS — Z1231 Encounter for screening mammogram for malignant neoplasm of breast: Secondary | ICD-10-CM

## 2018-05-29 ENCOUNTER — Ambulatory Visit (HOSPITAL_COMMUNITY)
Admission: RE | Admit: 2018-05-29 | Discharge: 2018-05-29 | Disposition: A | Discharge status: Home / Self Care | Payer: Medicare HMO | Source: Ambulatory Visit | Attending: Internal Medicine | Admitting: Internal Medicine

## 2018-05-29 DIAGNOSIS — Z1231 Encounter for screening mammogram for malignant neoplasm of breast: Secondary | ICD-10-CM | POA: Diagnosis not present

## 2018-06-12 DIAGNOSIS — Z139 Encounter for screening, unspecified: Secondary | ICD-10-CM | POA: Diagnosis not present

## 2018-06-12 DIAGNOSIS — I6389 Other cerebral infarction: Secondary | ICD-10-CM | POA: Diagnosis not present

## 2018-06-12 DIAGNOSIS — I10 Essential (primary) hypertension: Secondary | ICD-10-CM | POA: Diagnosis not present

## 2018-06-14 ENCOUNTER — Telehealth: Payer: Self-pay

## 2018-06-14 DIAGNOSIS — H5017 Alternating exotropia with V pattern: Secondary | ICD-10-CM | POA: Diagnosis not present

## 2018-06-14 DIAGNOSIS — E119 Type 2 diabetes mellitus without complications: Secondary | ICD-10-CM | POA: Diagnosis not present

## 2018-06-14 DIAGNOSIS — H25813 Combined forms of age-related cataract, bilateral: Secondary | ICD-10-CM | POA: Diagnosis not present

## 2018-06-14 NOTE — Telephone Encounter (Signed)
Received a VM from pt asking when was she due for her next TCS. Please advise.

## 2018-06-14 NOTE — Telephone Encounter (Signed)
Sometime in the next 12 months

## 2018-06-15 NOTE — Telephone Encounter (Signed)
Lmom, waiting on a return call.  

## 2018-06-20 NOTE — Telephone Encounter (Signed)
Called pt, no answer. Letter mailed to pt. Please make sure there is a recall for pts next TCS in 12 months.

## 2018-06-20 NOTE — Telephone Encounter (Signed)
PUT PATIENT ON RECALL

## 2018-06-26 DIAGNOSIS — Z139 Encounter for screening, unspecified: Secondary | ICD-10-CM | POA: Diagnosis not present

## 2018-06-26 DIAGNOSIS — E119 Type 2 diabetes mellitus without complications: Secondary | ICD-10-CM | POA: Diagnosis not present

## 2018-06-26 DIAGNOSIS — I10 Essential (primary) hypertension: Secondary | ICD-10-CM | POA: Diagnosis not present

## 2018-06-26 DIAGNOSIS — E1140 Type 2 diabetes mellitus with diabetic neuropathy, unspecified: Secondary | ICD-10-CM | POA: Diagnosis not present

## 2018-06-26 DIAGNOSIS — I6389 Other cerebral infarction: Secondary | ICD-10-CM | POA: Diagnosis not present

## 2018-07-11 ENCOUNTER — Other Ambulatory Visit (HOSPITAL_COMMUNITY)
Admission: RE | Admit: 2018-07-11 | Discharge: 2018-07-11 | Disposition: A | Discharge status: Home / Self Care | Payer: Medicare HMO | Source: Ambulatory Visit | Attending: Obstetrics & Gynecology | Admitting: Obstetrics & Gynecology

## 2018-07-11 ENCOUNTER — Encounter: Payer: Self-pay | Admitting: Obstetrics & Gynecology

## 2018-07-11 ENCOUNTER — Ambulatory Visit: Payer: Medicare HMO | Admitting: Obstetrics & Gynecology

## 2018-07-11 VITALS — BP 159/66 | HR 69 | Ht 66.5 in | Wt 250.0 lb

## 2018-07-11 DIAGNOSIS — Z124 Encounter for screening for malignant neoplasm of cervix: Secondary | ICD-10-CM | POA: Diagnosis not present

## 2018-07-11 DIAGNOSIS — Z1211 Encounter for screening for malignant neoplasm of colon: Secondary | ICD-10-CM

## 2018-07-11 DIAGNOSIS — Z1212 Encounter for screening for malignant neoplasm of rectum: Secondary | ICD-10-CM

## 2018-07-11 DIAGNOSIS — Z01419 Encounter for gynecological examination (general) (routine) without abnormal findings: Secondary | ICD-10-CM

## 2018-07-11 LAB — HEMOCCULT GUIAC POC 1CARD (OFFICE): Fecal Occult Blood, POC: NEGATIVE

## 2018-07-11 NOTE — Progress Notes (Signed)
Subjective:     Melanie Spencer is a 57 y.o. female here for a routine exam.  Patient's last menstrual period was 03/02/2012. G0P0000 Birth Control Method:  Post menopausal Menstrual Calendar(currently): amenorrheic  Current complaints: none.   Current acute medical issues:  stroke   Recent Gynecologic History Patient's last menstrual period was 03/02/2012. Last Pap: earlier than 2012,  normal Last mammogram: 2019,  normal  Past Medical History:  Diagnosis Date  . Diabetes mellitus without complication (HCC)   . Stroke [facility])     Past Surgical History:  Procedure Laterality Date  . CHOLECYSTECTOMY      OB History    Gravida  0   Para  0   Term  0   Preterm  0   AB  0   Living  0     SAB  0   TAB  0   Ectopic  0   Multiple  0   Live Births  0           Social History   Socioeconomic History  . Marital status: Married    Spouse name: Not on file  . Number of children: Not on file  . Years of education: Not on file  . Highest education level: Not on file  Occupational History  . Not on file  Social Needs  . Financial resource strain: Not on file  . Food insecurity:    Worry: Not on file    Inability: Not on file  . Transportation needs:    Medical: Not on file    Non-medical: Not on file  Tobacco Use  . Smoking status: Former Smoker    Last attempt to quit: 07/26/2005    Years since quitting: 12.9  . Smokeless tobacco: Never Used  Substance and Sexual Activity  . Alcohol use: No    Alcohol/week: 0.0 standard drinks  . Drug use: No  . Sexual activity: Not Currently    Birth control/protection: Post-menopausal  Lifestyle  . Physical activity:    Days per week: Not on file    Minutes per session: Not on file  . Stress: Not on file  Relationships  . Social connections:    Talks on phone: Not on file    Gets together: Not on file    Attends religious service: Not on file    Active member of club or organization: Not on file    Attends  meetings of clubs or organizations: Not on file    Relationship status: Not on file  Other Topics Concern  . Not on file  Social History Narrative  . Not on file    Family History  Problem Relation Age of Onset  . Heart disease Mother   . Diabetes Mother   . Hyperlipidemia Mother   . Hypertension Mother   . Cancer Father   . Hypertension Sister      Current Outpatient Medications:  .  aspirin 325 MG tablet, Take 325 mg by mouth daily., Disp: , Rfl:  .  cholecalciferol (VITAMIN D) 1000 UNITS tablet, Take 1,000 Units by mouth daily., Disp: , Rfl:  .  gabapentin (NEURONTIN) 300 MG capsule, Take 300 mg by mouth 2 (two) times daily., Disp: , Rfl:  .  LANTUS SOLOSTAR 100 UNIT/ML Solostar Pen, 60 Units daily. , Disp: , Rfl: 1 .  metFORMIN (GLUCOPHAGE) 1000 MG tablet, 1,000 mg 2 (two) times daily with a meal. , Disp: , Rfl: 1 .  omeprazole (PRILOSEC) 20  MG capsule, Take 20 mg by mouth daily., Disp: , Rfl:  .  ramipril (ALTACE) 10 MG capsule, Take 20 mg by mouth daily., Disp: , Rfl:  .  simvastatin (ZOCOR) 20 MG tablet, Take 20 mg by mouth daily., Disp: , Rfl:  .  SITagliptin Phosphate (JANUVIA PO), Take by mouth. Takes 2 pills once daily, Disp: , Rfl:   Review of Systems  Review of Systems  Constitutional: Negative for fever, chills, weight loss, malaise/fatigue and diaphoresis.  HENT: Negative for hearing loss, ear pain, nosebleeds, congestion, sore throat, neck pain, tinnitus and ear discharge.   Eyes: Negative for blurred vision, double vision, photophobia, pain, discharge and redness.  Respiratory: Negative for cough, hemoptysis, sputum production, shortness of breath, wheezing and stridor.   Cardiovascular: Negative for chest pain, palpitations, orthopnea, claudication, leg swelling and PND.  Gastrointestinal: negative for abdominal pain. Negative for heartburn, nausea, vomiting, diarrhea, constipation, blood in stool and melena.  Genitourinary: Negative for dysuria, urgency,  frequency, hematuria and flank pain.  Musculoskeletal: Negative for myalgias, back pain, joint pain and falls.  Skin: Negative for itching and rash.  Neurological: Negative for dizziness, tingling, tremors, sensory change, speech change, focal weakness, seizures, loss of consciousness, weakness and headaches.  Endo/Heme/Allergies: Negative for environmental allergies and polydipsia. Does not bruise/bleed easily.  Psychiatric/Behavioral: Negative for depression, suicidal ideas, hallucinations, memory loss and substance abuse. The patient is not nervous/anxious and does not have insomnia.        Objective:  Blood pressure (!) 159/66, pulse 69, height 5' 6.5" (1.689 m), weight 250 lb (113.4 kg), last menstrual period 03/02/2012.   Physical Exam  Vitals reviewed. Constitutional: She is oriented to person, place, and time. She appears well-developed and well-nourished.  HENT:  Head: Normocephalic and atraumatic.        Right Ear: External ear normal.  Left Ear: External ear normal.  Nose: Nose normal.  Mouth/Throat: Oropharynx is clear and moist.  Eyes: Conjunctivae and EOM are normal. Pupils are equal, round, and reactive to light. Right eye exhibits no discharge. Left eye exhibits no discharge. No scleral icterus.  Neck: Normal range of motion. Neck supple. No tracheal deviation present. No thyromegaly present.  Cardiovascular: Normal rate, regular rhythm, normal heart sounds and intact distal pulses.  Exam reveals no gallop and no friction rub.   No murmur heard. Respiratory: Effort normal and breath sounds normal. No respiratory distress. She has no wheezes. She has no rales. She exhibits no tenderness.  GI: Soft. Bowel sounds are normal. She exhibits no distension and no mass. There is no tenderness. There is no rebound and no guarding.  Genitourinary:  Breasts no masses skin changes or nipple changes bilaterally      Vulva is normal without lesions Vagina is pink moist without  discharge Cervix normal in appearance and pap is done Uterus is normal size shape and contour Adnexa is negative with normal sized ovaries  {Rectal    hemoccult negative, normal tone, no masses  Musculoskeletal: Normal range of motion. She exhibits no edema and no tenderness.  Neurological: She is alert and oriented to person, place, and time. She has normal reflexes. She displays normal reflexes. No cranial nerve deficit. She exhibits normal muscle tone. Coordination normal.  Skin: Skin is warm and dry. No rash noted. No erythema. No pallor.  Psychiatric: She has a normal mood and affect. Her behavior is normal. Judgment and thought content normal.       Medications Ordered at today's visit: No orders  of the defined types were placed in this encounter.   Other orders placed at today's visit: Orders Placed This Encounter  Procedures  . POCT occult blood stool      Assessment:    Healthy female exam.    Plan:    Mammogram ordered. Follow up in: 3 years.     Return in about 3 years (around 07/11/2021) for yearly.

## 2018-07-14 LAB — CYTOLOGY - PAP
Diagnosis: NEGATIVE
HPV: NOT DETECTED

## 2018-09-12 ENCOUNTER — Ambulatory Visit: Payer: Medicare PPO | Admitting: Orthopaedic Surgery

## 2018-09-12 ENCOUNTER — Encounter: Payer: Self-pay | Admitting: Orthopaedic Surgery

## 2018-09-12 VITALS — BP 165/66 | HR 70 | Ht 66.5 in | Wt 248.0 lb

## 2018-09-12 DIAGNOSIS — M25572 Pain in left ankle and joints of left foot: Secondary | ICD-10-CM | POA: Diagnosis not present

## 2018-09-12 DIAGNOSIS — G8194 Hemiplegia, unspecified affecting left nondominant side: Secondary | ICD-10-CM

## 2018-09-12 DIAGNOSIS — G8929 Other chronic pain: Secondary | ICD-10-CM

## 2018-09-12 NOTE — Progress Notes (Signed)
   Subjective:    Patient ID: Melanie Spencer, female    DOB: 23-Dec-1960, 58 y.o.   MRN: [id number]  HPI She had a stroke about 12 years ago with left sided hemiparesis as a residual.  She has had problems with the left ankle turning in recently and not being able to support her at times.  She fell recently and hurt her left eye and head.  She is followed by Dr. Karilyn Cota.  I have read his notes.  She wants an Chief Operating Officer to support her foot.  She has problems getting out of the shower.  She used to have a shower seat but gave it to a relative.  I have talked to her about going to PT and being evaluated for home devices that will help her do her activities of daily living.  She says her husband works and she has no other means to get to PT.  She has no family, no friends, no church family.  Her husband did agree to go by PT today and talk to them about what is available and what options they offer.  They will also talk about her insurance coverage.  I have recommended a high top shoe.  She does not want to do that.  I will give her an Chief Operating Officer but I told her this is just a small part of what could be done.  She says she will consider the other things.   Review of Systems  Constitutional: Positive for activity change.  Musculoskeletal: Positive for arthralgias, gait problem, joint swelling and myalgias.  Neurological: Positive for weakness.       Left hemiparesis       Objective:   Physical Exam Constitutional:      Appearance: She is well-developed.  HENT:     Head: Normocephalic and atraumatic.  Eyes:     Conjunctiva/sclera: Conjunctivae normal.     Pupils: Pupils are equal, round, and reactive to light.  Neck:     Musculoskeletal: Normal range of motion and neck supple.  Cardiovascular:     Rate and Rhythm: Normal rate and regular rhythm.  Pulmonary:     Effort: Pulmonary effort is normal.  Abdominal:     Palpations: Abdomen is soft.  Musculoskeletal:     Left ankle: She  exhibits decreased range of motion and swelling.       Feet:  Skin:    General: Skin is warm and dry.  Neurological:     Mental Status: She is alert and oriented to person, place, and time.     Cranial Nerves: No cranial nerve deficit.     Motor: Weakness present. No abnormal muscle tone.     Coordination: Coordination abnormal.     Gait: Gait abnormal.     Deep Tendon Reflexes: Reflexes abnormal.     Comments: Left hemiparesis  Psychiatric:        Behavior: Behavior normal.        Thought Content: Thought content normal.        Judgment: Judgment normal.           Assessment & Plan:   Encounter Diagnoses  Name Primary?  . Left hemiparesis (HCC) Yes  . Chronic pain of left ankle    She and her husband are to let me know about Rx for high top shoes and PT and rehab services.  Call if any problem.  Precautions discussed.   Electronically Signed Darreld Mclean, MD 2/18/20203:59 PM

## 2018-09-26 DIAGNOSIS — E785 Hyperlipidemia, unspecified: Secondary | ICD-10-CM | POA: Diagnosis not present

## 2018-09-26 DIAGNOSIS — E1140 Type 2 diabetes mellitus with diabetic neuropathy, unspecified: Secondary | ICD-10-CM | POA: Diagnosis not present

## 2018-09-26 DIAGNOSIS — I10 Essential (primary) hypertension: Secondary | ICD-10-CM | POA: Diagnosis not present

## 2018-09-26 DIAGNOSIS — R011 Cardiac murmur, unspecified: Secondary | ICD-10-CM | POA: Diagnosis not present

## 2018-10-25 ENCOUNTER — Other Ambulatory Visit: Payer: Medicare HMO

## 2018-11-14 ENCOUNTER — Ambulatory Visit: Payer: Self-pay | Admitting: "Endocrinology

## 2018-11-14 ENCOUNTER — Ambulatory Visit (INDEPENDENT_AMBULATORY_CARE_PROVIDER_SITE_OTHER): Payer: Medicare HMO | Admitting: Nurse Practitioner

## 2018-11-16 ENCOUNTER — Other Ambulatory Visit: Payer: Medicare HMO

## 2018-12-04 DIAGNOSIS — R011 Cardiac murmur, unspecified: Secondary | ICD-10-CM | POA: Diagnosis not present

## 2018-12-04 DIAGNOSIS — E119 Type 2 diabetes mellitus without complications: Secondary | ICD-10-CM | POA: Diagnosis not present

## 2018-12-04 DIAGNOSIS — R3981 Functional urinary incontinence: Secondary | ICD-10-CM | POA: Diagnosis not present

## 2018-12-04 DIAGNOSIS — I6389 Other cerebral infarction: Secondary | ICD-10-CM | POA: Diagnosis not present

## 2018-12-06 ENCOUNTER — Other Ambulatory Visit: Payer: Self-pay | Admitting: Internal Medicine

## 2018-12-06 DIAGNOSIS — R011 Cardiac murmur, unspecified: Secondary | ICD-10-CM

## 2018-12-25 ENCOUNTER — Ambulatory Visit: Payer: Self-pay | Admitting: "Endocrinology

## 2018-12-28 ENCOUNTER — Other Ambulatory Visit: Payer: Medicare HMO

## 2019-01-24 ENCOUNTER — Telehealth: Payer: Self-pay | Admitting: Cardiovascular Disease

## 2019-01-24 NOTE — Telephone Encounter (Signed)

## 2019-01-25 ENCOUNTER — Ambulatory Visit (INDEPENDENT_AMBULATORY_CARE_PROVIDER_SITE_OTHER): Payer: Medicare PPO

## 2019-01-25 DIAGNOSIS — R011 Cardiac murmur, unspecified: Secondary | ICD-10-CM | POA: Diagnosis not present

## 2019-02-07 DIAGNOSIS — E11621 Type 2 diabetes mellitus with foot ulcer: Secondary | ICD-10-CM | POA: Diagnosis not present

## 2019-02-07 DIAGNOSIS — R011 Cardiac murmur, unspecified: Secondary | ICD-10-CM | POA: Diagnosis not present

## 2019-02-07 DIAGNOSIS — E1140 Type 2 diabetes mellitus with diabetic neuropathy, unspecified: Secondary | ICD-10-CM | POA: Diagnosis not present

## 2019-02-07 DIAGNOSIS — R5383 Other fatigue: Secondary | ICD-10-CM | POA: Diagnosis not present

## 2019-02-07 DIAGNOSIS — R3981 Functional urinary incontinence: Secondary | ICD-10-CM | POA: Diagnosis not present

## 2019-02-07 DIAGNOSIS — I10 Essential (primary) hypertension: Secondary | ICD-10-CM | POA: Diagnosis not present

## 2019-02-28 ENCOUNTER — Encounter: Payer: Self-pay | Admitting: Internal Medicine

## 2019-03-02 ENCOUNTER — Other Ambulatory Visit: Payer: Self-pay | Admitting: Podiatry

## 2019-03-02 ENCOUNTER — Ambulatory Visit (INDEPENDENT_AMBULATORY_CARE_PROVIDER_SITE_OTHER): Payer: Medicare PPO

## 2019-03-02 ENCOUNTER — Ambulatory Visit: Payer: Medicare PPO | Admitting: Podiatry

## 2019-03-02 ENCOUNTER — Other Ambulatory Visit: Payer: Self-pay

## 2019-03-02 ENCOUNTER — Encounter: Payer: Self-pay | Admitting: Podiatry

## 2019-03-02 VITALS — BP 157/62 | HR 68 | Temp 97.7°F | Resp 16

## 2019-03-02 DIAGNOSIS — E08621 Diabetes mellitus due to underlying condition with foot ulcer: Secondary | ICD-10-CM

## 2019-03-02 DIAGNOSIS — I739 Peripheral vascular disease, unspecified: Secondary | ICD-10-CM

## 2019-03-02 DIAGNOSIS — L97515 Non-pressure chronic ulcer of other part of right foot with muscle involvement without evidence of necrosis: Secondary | ICD-10-CM

## 2019-03-02 NOTE — Progress Notes (Signed)
Subjective:   Patient ID: Melanie Spencer, female   DOB: 58 y.o.   MRN: [id number]   HPI 58 year old female presents the office today for concerns of a wound on the ball of her right foot.  She presents today with her husband.  She had a stroke in 2007 affecting her left side and she wears a brace.  She is been soaking in Epsom salts and applying Neosporin to the area.  Reports moderate drainage, mostly bloody clear drainage. No pus.  Her husband thinks is getting better.  This is been ongoing for last 2 weeks.  No odor.  She also notes she states her last A1c was either 11 or 12.  No increase in swelling or redness or red streaks.   Review of Systems  All other systems reviewed and are negative.  Past Medical History:  Diagnosis Date  . Diabetes mellitus without complication ([place])   . Stroke [facility])     Past Surgical History:  Procedure Laterality Date  . CHOLECYSTECTOMY       Current Outpatient Medications:  .  aspirin 325 MG tablet, Take 325 mg by mouth daily., Disp: , Rfl:  .  cholecalciferol (VITAMIN D) 1000 UNITS tablet, Take 1,000 Units by mouth daily., Disp: , Rfl:  .  doxycycline (VIBRA-TABS) 100 MG tablet, Take 1 tablet (100 mg total) by mouth 2 (two) times daily., Disp: 20 tablet, Rfl: 0 .  fluticasone (FLONASE) 50 MCG/ACT nasal spray, Place 1 spray into both nostrils daily., Disp: , Rfl:  .  gabapentin (NEURONTIN) 300 MG capsule, Take 300 mg by mouth 2 (two) times daily., Disp: , Rfl:  .  JANUVIA 50 MG tablet, , Disp: , Rfl:  .  JARDIANCE 10 MG TABS tablet, , Disp: , Rfl:  .  LANTUS SOLOSTAR 100 UNIT/ML Solostar Pen, 60 Units daily. , Disp: , Rfl: 1 .  metFORMIN (GLUCOPHAGE) 1000 MG tablet, 1,000 mg 2 (two) times daily with a meal. , Disp: , Rfl: 1 .  omeprazole (PRILOSEC) 20 MG capsule, Take 20 mg by mouth daily., Disp: , Rfl:  .  oxybutynin (DITROPAN) 5 MG tablet, , Disp: , Rfl:  .  ramipril (ALTACE) 10 MG capsule, Take 20 mg by mouth daily., Disp: , Rfl:  .   simvastatin (ZOCOR) 40 MG tablet, , Disp: , Rfl:   No Known Allergies        Objective:  Physical Exam  General: AAO x3, NAD  Dermatological: On the right foot submetatarsal 5 there is a ulceration with surrounding hyperkeratotic tissue.  Wound base is granular.  After debridement the wound measures 1.4 x 1.4 x 0.2 cm.  Prior to debridement the wound measured 1.3 x 1.3 x 0.1 cm.  There is no probing to bone, undermining or tunneling.  No surrounding erythema, ascending cellulitis.  No fluctuation crepitation malodor.  No other open lesions identified.      Vascular: Dorsalis Pedis artery and Posterior Tibial artery pedal pulses are 1/4 bilateral with immedate capillary fill time. There is no pain with calf compression, swelling, warmth, erythema.   Neruologic: Sensation decreased with Semmes Weinstein monofilament Musculoskeletal: No gross boney pedal deformities bilateral. No pain, crepitus, or limitation noted with foot and ankle range of motion bilateral.   Assessment:   Ulceration right submetatarsal 5     Plan:  -Treatment options discussed including all alternatives, risks, and complications -Etiology of symptoms were discussed -X-rays were obtained reviewed.  No definitive evidence of acute osteomyelitis no soft  tissue emphysema present. -I debrided the wound utilizing #312 with scalpel down to healthy, bleeding, granular tissue. -ABI performed in the office today which was 1.3 bilaterally. -Offloading pads dispensed today and also applied. -Daily dressing changes. [facility]  -Monitor for any clinical signs or symptoms of infection and directed to call the office immediately should any occur or go to the ER.  Return in about 10 weeks (around 05/11/2019).  Vivi BarrackMatthew R  DPM

## 2019-03-05 ENCOUNTER — Telehealth: Payer: Self-pay | Admitting: *Deleted

## 2019-03-05 ENCOUNTER — Telehealth: Payer: Self-pay | Admitting: Podiatry

## 2019-03-05 MED ORDER — DOXYCYCLINE HYCLATE 100 MG PO TABS: 100.0000 mg | ORAL_TABLET | Freq: Two times a day (BID) | ORAL | 0 refills | Status: DC

## 2019-03-05 NOTE — Telephone Encounter (Signed)
Pt states she was seen 03/02/2019 and she was to get an antibiotic for the ulcer and she called this morning and the antibiotic was not there.

## 2019-03-05 NOTE — Telephone Encounter (Signed)
Doxycycline sent to pharmacy

## 2019-03-05 NOTE — Telephone Encounter (Signed)
Unable to leave a message the mailbox was full. 

## 2019-03-05 NOTE — Telephone Encounter (Signed)
Pt was seen 03/02/2019 and she has not received her anti-biotic at her pharmacy, please call patient

## 2019-03-06 NOTE — Telephone Encounter (Signed)
Unable to leave a message the mailbox is full. °

## 2019-03-12 ENCOUNTER — Ambulatory Visit: Payer: Medicare PPO | Admitting: Podiatry

## 2019-03-12 DIAGNOSIS — E1140 Type 2 diabetes mellitus with diabetic neuropathy, unspecified: Secondary | ICD-10-CM | POA: Diagnosis not present

## 2019-03-12 DIAGNOSIS — I10 Essential (primary) hypertension: Secondary | ICD-10-CM | POA: Diagnosis not present

## 2019-03-13 ENCOUNTER — Other Ambulatory Visit: Payer: Self-pay

## 2019-03-13 ENCOUNTER — Encounter: Payer: Self-pay | Admitting: Podiatry

## 2019-03-13 ENCOUNTER — Ambulatory Visit: Payer: Medicare PPO | Admitting: Podiatry

## 2019-03-13 VITALS — Temp 98.2°F

## 2019-03-13 DIAGNOSIS — L97512 Non-pressure chronic ulcer of other part of right foot with fat layer exposed: Secondary | ICD-10-CM | POA: Diagnosis not present

## 2019-03-13 DIAGNOSIS — E1149 Type 2 diabetes mellitus with other diabetic neurological complication: Secondary | ICD-10-CM

## 2019-03-19 NOTE — Progress Notes (Signed)
Subjective: 58 year old female presents the office today for evaluation of a wound on the ball of her right foot.  Her husband and her states they are doing somewhat better.  Has a very minimal drainage from the area and she only finished the antibiotics this weekend.  She does not do that she was barefoot at home she does not wear shoe. Denies any systemic complaints such as fevers, chills, nausea, vomiting. No acute changes since last appointment, and no other complaints at this time.   Objective: AAO x3, NAD DP/PT pulses palpable bilaterally, CRT less than 3 seconds Ulceration continues ongoing right foot submetatarsal 5.  Granular wound base is present with small hyperkeratotic periwound.  Upon debridement of the area today it measures about the same size at 1.2 x 1.3 cm.  Is superficial.  No probing to bone, undermining or tunneling.  There is less hyperkeratotic tissue today.  No surrounding erythema, ascending cellulitis.  Ulceration crepitation malodor. No open lesions or pre-ulcerative lesions.  No pain with calf compression, swelling, warmth, erythema  Assessment: Right foot ulceration  Plan: -All treatment options discussed with the patient including all alternatives, risks, complications.  -I debrided the wound #312 with scalpel that any complications or bleeding. -Continue with daily dressing changes.  We will do medihoney to the wound daily. -Surgical shoe with Pegasys dispensed for offloading.  Discussed given the neuropathy recover off of this area also discussed glucose control. -Monitor for any clinical signs or symptoms of infection and directed to call the office immediately should any occur or go to the ER. -Patient encouraged to call the office with any questions, concerns, change in symptoms.   Trula Slade DPM

## 2019-03-22 ENCOUNTER — Ambulatory Visit: Payer: Medicare PPO | Admitting: Podiatry

## 2019-03-22 ENCOUNTER — Encounter: Payer: Self-pay | Admitting: Podiatry

## 2019-03-22 ENCOUNTER — Other Ambulatory Visit: Payer: Self-pay

## 2019-03-22 DIAGNOSIS — E1149 Type 2 diabetes mellitus with other diabetic neurological complication: Secondary | ICD-10-CM | POA: Diagnosis not present

## 2019-03-22 DIAGNOSIS — L97512 Non-pressure chronic ulcer of other part of right foot with fat layer exposed: Secondary | ICD-10-CM | POA: Diagnosis not present

## 2019-03-22 DIAGNOSIS — M216X1 Other acquired deformities of right foot: Secondary | ICD-10-CM

## 2019-03-22 NOTE — Progress Notes (Signed)
Subjective: 58 year old female presents the office today for follow-up evaluation of a wound on the ball of her right foot.  Her husband states that she is doing better and he thinks the wound is getting smaller although slowly.  Denies any drainage or pus he denies any surrounding redness or red streaks.  No pain.  She is not able to wear the surgical shoe because it is causing the leg to cramp, hurt. This resolved when wearing a regular shoe. Denies any systemic complaints such as fevers, chills, nausea, vomiting. No acute changes since last appointment, and no other complaints at this time.   Objective: AAO x3, NAD DP/PT pulses palpable bilaterally, CRT less than 3 seconds Ulceration continues ongoing right foot submetatarsal 5.  Granular wound base is present with small hyperkeratotic periwound with some macerated tissue on the periwound.  After debridement today the wound measures the same size of approximately 1.3 x 1.3 cm with a depth of 0.1.  There is a granular wound base.  There is no probing to bone, undermining or tunneling, there is prominence the metatarsal head plantarly that we are able to palpate. No open lesions or pre-ulcerative lesions.  No pain with calf compression, swelling, warmth, erythema  Assessment: Right foot ulceration  Plan: -All treatment options discussed with the patient including all alternatives, risks, complications.  -I debrided the wound #312 with scalpel that any complications or bleeding. -Continue with daily dressing changes.  We will do medihoney to the wound daily for now but I have ordered collagen silver dressing today through [facility].  -Offloading at all times.  Unable to wear surgical shoe so we dispensed offloading pads -Discussed importance of glucose control. -Elevation -Monitor for any clinical signs or symptoms of infection and directed to call the office immediately should any occur or go to the ER.  Return in about 10 days (around  04/01/2019).  Trula Slade DPM

## 2019-03-23 ENCOUNTER — Telehealth: Payer: Self-pay | Admitting: *Deleted

## 2019-03-23 NOTE — Telephone Encounter (Signed)
Dr. Jacqualyn Posey ordered silver collagen, sterile gauze, conforming roll gauze, tape for daily dressing changes for right foot ulcer L97.512, E11.49, M21.6X1, measuring 1.3 x 1.3 x 0.1cm, from [facility]. Faxed orders on required form, clinicals and demographics.

## 2019-04-03 ENCOUNTER — Other Ambulatory Visit (INDEPENDENT_AMBULATORY_CARE_PROVIDER_SITE_OTHER): Payer: Self-pay | Admitting: Internal Medicine

## 2019-04-03 MED ORDER — SIMVASTATIN 40 MG PO TABS: 40.0000 mg | ORAL_TABLET | Freq: Every day | ORAL | 3 refills | Status: DC

## 2019-04-05 ENCOUNTER — Encounter: Payer: Self-pay | Admitting: Podiatry

## 2019-04-05 ENCOUNTER — Other Ambulatory Visit: Payer: Self-pay

## 2019-04-05 ENCOUNTER — Ambulatory Visit: Payer: Medicare PPO | Admitting: Podiatry

## 2019-04-05 DIAGNOSIS — M216X1 Other acquired deformities of right foot: Secondary | ICD-10-CM

## 2019-04-05 DIAGNOSIS — E1149 Type 2 diabetes mellitus with other diabetic neurological complication: Secondary | ICD-10-CM | POA: Diagnosis not present

## 2019-04-05 DIAGNOSIS — L97512 Non-pressure chronic ulcer of other part of right foot with fat layer exposed: Secondary | ICD-10-CM | POA: Diagnosis not present

## 2019-04-05 MED ORDER — DOXYCYCLINE HYCLATE 100 MG PO TABS: 100.0000 mg | ORAL_TABLET | Freq: Two times a day (BID) | ORAL | 0 refills | Status: DC

## 2019-04-12 ENCOUNTER — Ambulatory Visit (INDEPENDENT_AMBULATORY_CARE_PROVIDER_SITE_OTHER): Payer: Medicare PPO | Admitting: Podiatry

## 2019-04-12 ENCOUNTER — Other Ambulatory Visit: Payer: Self-pay

## 2019-04-12 DIAGNOSIS — M216X1 Other acquired deformities of right foot: Secondary | ICD-10-CM

## 2019-04-12 DIAGNOSIS — L97512 Non-pressure chronic ulcer of other part of right foot with fat layer exposed: Secondary | ICD-10-CM | POA: Diagnosis not present

## 2019-04-12 DIAGNOSIS — E1149 Type 2 diabetes mellitus with other diabetic neurological complication: Secondary | ICD-10-CM

## 2019-04-12 NOTE — Progress Notes (Signed)
Subjective: 58 year old female presents the office today for follow-up evaluation of a wound on the ball of her right foot.  She does state that she showers with the bandage on.  There is been some white skin around the wound.  Her husband does change the bandage twice a day and then putting actisorb on the wound.  Her husband states that the wound is getting better although slowly.  Denies any drainage or pus.  Has not not had any drainage for the last couple of days. Denies any systemic complaints such as fevers, chills, nausea, vomiting. No acute changes since last appointment, and no other complaints at this time.   Objective: AAO x3, NAD DP/PT pulses palpable bilaterally, CRT less than 3 seconds Ulceration continues ongoing right foot submetatarsal 5.  Granular wound base is present with small hyperkeratotic periwound with some macerated tissue on the periwound.  After debridement today the wound measures the same size of approximately 1.4 x 1 cm with a depth of 0.1 and appears to be more granular today.  Prominence the fifth metatarsal head plantarly.  Macerated tissue on periwound.  No ascending cellulitis.  No surrounding erythema, fluctuation crepitation.  There is no malodor.  No pain with calf compression, swelling, warmth, erythema  Assessment: Right foot ulceration  Plan: -All treatment options discussed with the patient including all alternatives, risks, complications.  -I debrided the wound #312 with scalpel that any complications or bleeding.  Today applied Actisorb.  We will switch to collagen, silver dressing.  I will order this through [facility].  -Difficult to offload the area.  She is wearing inserts that I modified for her which is been helpful however given her gait instability and when she is walking and pressure difficult to offload this wound.  I want to have her follow-up with Liliane Channel at her next appointment.  -Her husband has paperwork that needs to be completed as he has to take  time off of work to come bring her to the appointments.  He will bring the paperwork to the office for [place] to fill out.  Return in about 10 days (around 04/22/2019).  Trula Slade DPM

## 2019-04-13 ENCOUNTER — Telehealth: Payer: Self-pay | Admitting: *Deleted

## 2019-04-13 NOTE — Telephone Encounter (Signed)
Dr. Jacqualyn Posey ordered Silver Collagen for right foot ulcer L97.512, E11.49, M21.6X1 measuring 1.4 x 1.0 x 0.1cm from [facility]. Faxed orders to [facility].

## 2019-04-16 NOTE — Progress Notes (Signed)
Subjective: 58 year old female presents the office today for follow-up evaluation of a wound on the ball of her right foot.  She has very minimal drainage and describes multiple bloody drainage as opposed to any kind of purulence.  Denies any increase in swelling or redness or any pain.  Denies any systemic complaints such as fevers, chills, nausea, vomiting. No acute changes since last appointment, and no other complaints at this time.   Objective: AAO x3, NAD DP/PT pulses palpable bilaterally, CRT less than 3 seconds Ulceration continues ongoing right foot submetatarsal 5.  Granular wound base is present with small hyperkeratotic periwound with some macerated tissue on the periwound.  After debridement today the wound measures the same size of approximately 1.3 x 1.3 cm with a depth of 0.1.  There is a granular wound base.  The wound is unchanged.  No open lesions or pre-ulcerative lesions.  No pain with calf compression, swelling, warmth, erythema  Assessment: Right foot ulceration  Plan: -All treatment options discussed with the patient including all alternatives, risks, complications.  -I debrided the wound #312 with scalpel that any complications or bleeding. -Continue with daily dressing changes and switch to Actisorb which is also dispensed today.  I used a generic insert and I modified this to offload the fifth metatarsal head.  Will this will help take more pressure off the area.  Discussed surgical excision of the fifth metatarsal head.  She not able to wear surgical shoe and I will hold off any surgery.  Unfortunate given her gait abnormality difficult to offload this area.  Trula Slade DPM

## 2019-04-23 ENCOUNTER — Other Ambulatory Visit: Payer: Medicare PPO | Admitting: Orthotics

## 2019-04-23 ENCOUNTER — Ambulatory Visit: Payer: Medicare PPO | Admitting: Podiatry

## 2019-04-23 ENCOUNTER — Other Ambulatory Visit: Payer: Self-pay

## 2019-04-23 DIAGNOSIS — L97512 Non-pressure chronic ulcer of other part of right foot with fat layer exposed: Secondary | ICD-10-CM | POA: Diagnosis not present

## 2019-04-23 DIAGNOSIS — E1149 Type 2 diabetes mellitus with other diabetic neurological complication: Secondary | ICD-10-CM | POA: Diagnosis not present

## 2019-04-23 NOTE — Progress Notes (Signed)
Subjective: 59 year old female presents the office today for follow-up evaluation of a wound on the ball of her right foot.  Overall she states that she is doing well.  Her husband has not noticed any drainage and there is been applying Actisorb daily.  No swelling or redness of the foot.  She also presents today to see Liliane Channel for possible bracing. Denies any systemic complaints such as fevers, chills, nausea, vomiting. No acute changes since last appointment, and no other complaints at this time.   Objective: AAO x3, NAD DP/PT pulses palpable bilaterally, CRT less than 3 seconds Ulceration continues ongoing right foot submetatarsal 5.  Granular wound base is present with small hyperkeratotic periwound with some macerated tissue on the periwound.  After debridement today the wound measures the same size of approximately 1.4 x 1.1 cm has become more superficial and starting to scab over.  No probing to bone, undermining or tunneling.  No sign erythema, ascending cellulitis.  No fluctuation crepitation.  Prominent metatarsal head plantarly.  Dropfoot left side.  No pain with calf compression, swelling, warmth, erythema  Assessment: Right foot ulceration; dropfoot left side  Plan: -All treatment options discussed with the patient including all alternatives, risks, complications.  -I debrided the wound #312 with scalpel that any complications or bleeding.  Today applied Actisorb.  We will continue with this.  They were not able to get the collagen, silver dressing. -Continue offloading on the right foot daily.  Rick did modify the orthotic today.  Casted for left foot AFO. -Monitor for any clinical signs or symptoms of infection and directed to call the office immediately should any occur or go to the ER.  Follow-up in 1 week or sooner if needed.  Trula Slade DPM

## 2019-04-24 ENCOUNTER — Other Ambulatory Visit (INDEPENDENT_AMBULATORY_CARE_PROVIDER_SITE_OTHER): Payer: Self-pay | Admitting: Internal Medicine

## 2019-05-03 ENCOUNTER — Other Ambulatory Visit: Payer: Self-pay

## 2019-05-03 ENCOUNTER — Ambulatory Visit: Payer: Medicare PPO | Admitting: Podiatry

## 2019-05-03 DIAGNOSIS — L97512 Non-pressure chronic ulcer of other part of right foot with fat layer exposed: Secondary | ICD-10-CM

## 2019-05-03 DIAGNOSIS — E1149 Type 2 diabetes mellitus with other diabetic neurological complication: Secondary | ICD-10-CM

## 2019-05-04 ENCOUNTER — Other Ambulatory Visit (INDEPENDENT_AMBULATORY_CARE_PROVIDER_SITE_OTHER): Payer: Self-pay | Admitting: Nurse Practitioner

## 2019-05-08 ENCOUNTER — Other Ambulatory Visit (HOSPITAL_COMMUNITY): Payer: Self-pay | Admitting: Obstetrics & Gynecology

## 2019-05-08 DIAGNOSIS — Z1231 Encounter for screening mammogram for malignant neoplasm of breast: Secondary | ICD-10-CM

## 2019-05-15 ENCOUNTER — Ambulatory Visit (INDEPENDENT_AMBULATORY_CARE_PROVIDER_SITE_OTHER): Payer: Medicare PPO | Admitting: Internal Medicine

## 2019-05-17 ENCOUNTER — Ambulatory Visit: Payer: Medicare PPO | Admitting: Podiatry

## 2019-05-17 ENCOUNTER — Other Ambulatory Visit: Payer: Self-pay

## 2019-05-17 DIAGNOSIS — L97512 Non-pressure chronic ulcer of other part of right foot with fat layer exposed: Secondary | ICD-10-CM

## 2019-05-17 DIAGNOSIS — E1149 Type 2 diabetes mellitus with other diabetic neurological complication: Secondary | ICD-10-CM

## 2019-05-18 ENCOUNTER — Other Ambulatory Visit (INDEPENDENT_AMBULATORY_CARE_PROVIDER_SITE_OTHER): Payer: Self-pay | Admitting: Internal Medicine

## 2019-05-18 MED ORDER — LANTUS SOLOSTAR 100 UNIT/ML ~~LOC~~ SOPN: 75.0000 [IU] | PEN_INJECTOR | Freq: Every day | SUBCUTANEOUS | 1 refills | Status: DC

## 2019-05-20 NOTE — Progress Notes (Signed)
Subjective: 58 year old female presents the office today for follow-up evaluation of a wound on the ball of her right foot.  States that she is doing well.  Has not had any drainage to the wound for the last couple days and denies any swelling or redness.  They have been applying Actisorb to the wound daily.  Denies any systemic complaints such as fevers, chills, nausea, vomiting. No acute changes since last appointment, and no other complaints at this time.   Objective: AAO x3, NAD DP/PT pulses palpable bilaterally, CRT less than 3 seconds Ulceration continues ongoing right foot submetatarsal 5.  Granular wound base is present with small hyperkeratotic periwound with some macerated tissue on the periwound. Hair is present in the wound.  After debridement today the wound measures the same size of approximately 1.2 x 1 x 0.1 cm.  No probing to bone, undermining or tunneling.  No sign erythema, ascending cellulitis.  No fluctuation crepitation.  Prominent metatarsal head plantarly.  Dropfoot left side.  No pain with calf compression, swelling, warmth, erythema  Assessment: Right foot ulceration; dropfoot left side  Plan: -All treatment options discussed with the patient including all alternatives, risks, complications.  -I debrided the wound #312 with scalpel that any complications or bleeding.  Will switch to Prisma dressing changes daily.   -Continue offloading on the right foot daily.  Awaiting AFO -Monitor for any clinical signs or symptoms of infection and directed to call the office immediately should any occur or go to the ER.  Return in about 2 weeks (around 05/31/2019).  Trula Slade DPM

## 2019-05-20 NOTE — Progress Notes (Signed)
Subjective: 58 year old female presents the office today for follow-up evaluation of a wound on the ball of her right foot.  They state that the wound may be due about the same but is filling in.  They have been continuing with Actisorb dressing changes daily.  Advised to keep a horseshoe pad around the wound.  Denies any drainage or pus from swelling or redness.  Still awaiting her brace. Denies any systemic complaints such as fevers, chills, nausea, vomiting. No acute changes since last appointment, and no other complaints at this time.   Objective: AAO x3, NAD DP/PT pulses palpable bilaterally, CRT less than 3 seconds Ulceration continues ongoing right foot submetatarsal 5.  Granular wound base is present with small hyperkeratotic periwound.  After debridement today the wound measures the same size of approximately 1.1 x 1.1 cm has become more superficial and starting to scab over on the periphery.  No probing to bone, undermining or tunneling.  No sign erythema, ascending cellulitis.  No fluctuation crepitation.  Prominent metatarsal head plantarly.  Dropfoot left side.  No pain with calf compression, swelling, warmth, erythema  Assessment: Right foot ulceration; dropfoot left side  Plan: -All treatment options discussed with the patient including all alternatives, risks, complications.  -I debrided the wound #312 with scalpel that any complications or bleeding.  Today applied Actisorb and will continue with this as well.  Continue offloading at all times. -We did have a discussion regards of his metatarsal head excision however they were to hold off any surgical intervention.  Given her gait changes as well as the location of the wound is difficult to get this wound to heal.  Also I like to do a total contact cast but she is not able to tolerate this with her gait issues and she is also home alone and I am fearful that she will fall.  Return in about 2 weeks (around 05/17/2019).  Trula Slade DPM

## 2019-05-21 ENCOUNTER — Other Ambulatory Visit (INDEPENDENT_AMBULATORY_CARE_PROVIDER_SITE_OTHER): Payer: Self-pay | Admitting: Nurse Practitioner

## 2019-05-21 ENCOUNTER — Other Ambulatory Visit (INDEPENDENT_AMBULATORY_CARE_PROVIDER_SITE_OTHER): Payer: Self-pay | Admitting: Internal Medicine

## 2019-05-23 ENCOUNTER — Encounter (INDEPENDENT_AMBULATORY_CARE_PROVIDER_SITE_OTHER): Payer: Self-pay | Admitting: Internal Medicine

## 2019-05-23 ENCOUNTER — Ambulatory Visit (INDEPENDENT_AMBULATORY_CARE_PROVIDER_SITE_OTHER): Payer: Medicare PPO | Admitting: Internal Medicine

## 2019-05-23 ENCOUNTER — Other Ambulatory Visit: Payer: Self-pay

## 2019-05-23 VITALS — BP 180/80 | HR 72 | Ht 66.5 in | Wt 250.0 lb

## 2019-05-23 DIAGNOSIS — Z23 Encounter for immunization: Secondary | ICD-10-CM

## 2019-05-23 DIAGNOSIS — I10 Essential (primary) hypertension: Secondary | ICD-10-CM | POA: Insufficient documentation

## 2019-05-23 DIAGNOSIS — E785 Hyperlipidemia, unspecified: Secondary | ICD-10-CM

## 2019-05-23 DIAGNOSIS — E559 Vitamin D deficiency, unspecified: Secondary | ICD-10-CM

## 2019-05-23 DIAGNOSIS — I679 Cerebrovascular disease, unspecified: Secondary | ICD-10-CM

## 2019-05-23 DIAGNOSIS — E782 Mixed hyperlipidemia: Secondary | ICD-10-CM | POA: Diagnosis not present

## 2019-05-23 DIAGNOSIS — E1165 Type 2 diabetes mellitus with hyperglycemia: Secondary | ICD-10-CM

## 2019-05-23 DIAGNOSIS — E66812 Obesity, class 2: Secondary | ICD-10-CM | POA: Insufficient documentation

## 2019-05-23 DIAGNOSIS — IMO0002 Reserved for concepts with insufficient information to code with codable children: Secondary | ICD-10-CM

## 2019-05-23 DIAGNOSIS — Z1159 Encounter for screening for other viral diseases: Secondary | ICD-10-CM | POA: Diagnosis not present

## 2019-05-23 DIAGNOSIS — I639 Cerebral infarction, unspecified: Secondary | ICD-10-CM | POA: Insufficient documentation

## 2019-05-23 HISTORY — DX: Hyperlipidemia, unspecified: E78.5

## 2019-05-23 HISTORY — DX: Type 2 diabetes mellitus with hyperglycemia: E11.65

## 2019-05-23 HISTORY — DX: Reserved for concepts with insufficient information to code with codable children: IMO0002

## 2019-05-23 HISTORY — DX: Essential (primary) hypertension: I10

## 2019-05-23 HISTORY — DX: Cerebrovascular disease, unspecified: I67.9

## 2019-05-23 HISTORY — DX: Morbid (severe) obesity due to excess calories: E66.01

## 2019-05-23 NOTE — Progress Notes (Signed)
Metrics: Intervention Frequency ACO  Documented Smoking Status Yearly  Screened one or more times in 24 months  Cessation Counseling or  Active cessation medication Past 24 months  Past 24 months   Guideline developer: UpToDate (See UpToDate for funding source) Date Released: 2014       Wellness Office Visit  Subjective:  Patient ID: Melanie Spencer, female    DOB: May 09, 1961  Age: 58 y.o. MRN: [id number]  CC: This lady comes in for follow-up of hypertension, uncontrolled diabetes, morbid obesity, cerebrovascular disease. HPI She continues on insulin as well as oral hypoglycemic agents for her diabetes.  She has a foot ulcer which is being treated as a complication of diabetes.  Her diabetes is always been poorly controlled and her last hemoglobin A1c was above 11%.  Unfortunately, she has not been able to improve her eating habits and she is well aware that she may have another stroke or other complications of diabetes. She continues to take ACE inhibitor for her hypertension. Unfortunately, she has gained weight from the last visit. She denies any chest pain, dyspnea or palpitations.  Past Medical History:  Diagnosis Date  . Cerebrovascular disease 05/23/2019  . Essential hypertension, benign 05/23/2019  . HLD (hyperlipidemia) 05/23/2019  . Morbid obesity ([place]) 05/23/2019  . Stroke ([place])   . Type II diabetes mellitus, uncontrolled ([place]) 05/23/2019      Family History  Problem Relation Age of Onset  . Heart disease Mother   . Diabetes Mother   . Hyperlipidemia Mother   . Hypertension Mother   . Cancer Father   . Hypertension Sister     Social History   Social History Narrative   Married for 31 yrs.On disability secondary to CVA.Previously licensed Medical illustrator.   Social History   Tobacco Use  . Smoking status: Former Smoker    Quit date: 07/26/2005    Years since quitting: 13.8  . Smokeless tobacco: Never Used  Substance Use Topics  . Alcohol use: No   Alcohol/week: 0.0 standard drinks    Current Meds  Medication Sig  . aspirin 325 MG tablet Take 325 mg by mouth daily.  . cholecalciferol (VITAMIN D) 1000 UNITS tablet Take 500 Units by mouth daily.   . fluticasone (FLONASE) 50 MCG/ACT nasal spray SHAKE LIQUID AND USE 2 SPRAYS IN EACH NOSTRIL EVERY DAY  . gabapentin (NEURONTIN) 300 MG capsule TAKE 2 CAPSULES BY MOUTH THREE TIMES DAILY  . JANUVIA 100 MG tablet TAKE 1 TABLET BY MOUTH DAILY (Patient taking differently: Take 100 mg by mouth daily. )  . LANTUS SOLOSTAR 100 UNIT/ML Solostar Pen Inject 75 Units into the skin at bedtime. (Patient taking differently: Inject 62 Units into the skin at bedtime. )  . metFORMIN (GLUCOPHAGE) 1000 MG tablet TAKE 1 TABLET BY MOUTH TWICE DAILY  . omeprazole (PRILOSEC) 20 MG capsule TAKE 1 TABLET BY MOUTH DAILY  . oxybutynin (DITROPAN) 5 MG tablet TAKE 1 TABLET BY MOUTH TWICE DAILY  . ramipril (ALTACE) 10 MG capsule TAKE 2 CAPSULES BY MOUTH EVERY DAY  . simvastatin (ZOCOR) 40 MG tablet Take 1 tablet (40 mg total) by mouth daily at 6 PM.      Objective:   Today's Vitals: BP (!) 180/80   Pulse 72   Ht 5' 6.5" (1.689 m)   Wt 250 lb (113.4 kg)   LMP 03/02/2012   BMI 39.75 kg/m  Vitals with BMI 05/23/2019 03/02/2019 09/12/2018  Height 5' 6.5" - 5' 6.5"  Weight 250 lbs -  248 lbs  BMI 39.75 - 39.43  Systolic 180 157 726  Diastolic 80 62 66  Pulse 72 68 70     Physical Exam   She continues to gain weight and her blood pressure is not controlled today although she was rushing to get to the office.  She is alert and orientated and there are no new focal neurological signs.    Assessment   1. Vitamin D deficiency disease   2. Essential hypertension, benign   3. Uncontrolled type 2 diabetes mellitus with hyperglycemia (HCC)   4. Morbid obesity (HCC)   5. Mixed hyperlipidemia   6. Cerebrovascular disease   7. Encounter for hepatitis C screening test for low risk patient       Tests ordered  Orders Placed This Encounter  Procedures  . COMPLETE METABOLIC PANEL WITH GFR  . Lipid panel  . VITAMIN D 25 Hydroxy (Vit-D Deficiency, Fractures)  . Hemoglobin A1c  . Hepatitis C antibody     Plan: 1. She will continue with antihypertensive therapy.  Her blood pressure was elevated today and we will need to keep this under close monitor. 2. She will continue with insulin and oral hypoglycemic agents.  These are not optimal to control diabetes and she certainly needs to work on her diet which she has had difficulty doing.  I would not be surprised if the A1c that we are checking today is showing poor control again. 3. She will continue with statin therapy for hyperlipidemia in the face of diabetes. 4. She will continue with aspirin in view of the cerebrovascular disease and history of stroke. 5. Blood work is ordered above. 6. She will follow-up with Maralyn Sago in November for an annual Medicare wellness visit.  Further recommendations will depend on blood results.  She was given influenza vaccination today.   No orders of the defined types were placed in this encounter.   [name], MD

## 2019-05-24 ENCOUNTER — Other Ambulatory Visit (INDEPENDENT_AMBULATORY_CARE_PROVIDER_SITE_OTHER): Payer: Self-pay | Admitting: Internal Medicine

## 2019-05-24 LAB — COMPLETE METABOLIC PANEL WITH GFR
AG Ratio: 1.2 (calc) (ref 1.0–2.5)
ALT: 12 U/L (ref 6–29)
AST: 11 U/L (ref 10–35)
Albumin: 3.8 g/dL (ref 3.6–5.1)
Alkaline phosphatase (APISO): 96 U/L (ref 37–153)
BUN: 22 mg/dL (ref 7–25)
CO2: 25 mmol/L (ref 20–32)
Calcium: 9.1 mg/dL (ref 8.6–10.4)
Chloride: 100 mmol/L (ref 98–110)
Creat: 0.86 mg/dL (ref 0.50–1.05)
GFR, Est African American: 86 mL/min/{1.73_m2} (ref >=60)
GFR, Est Non African American: 74 mL/min/{1.73_m2} (ref >=60)
Globulin: 3.3 g/dL (calc) (ref 1.9–3.7)
Glucose, Bld: 407 mg/dL — ABNORMAL HIGH (ref 65–99)
Potassium: 5 mmol/L (ref 3.5–5.3)
Sodium: 136 mmol/L (ref 135–146)
Total Bilirubin: 0.2 mg/dL (ref 0.2–1.2)
Total Protein: 7.1 g/dL (ref 6.1–8.1)

## 2019-05-24 LAB — LIPID PANEL
Cholesterol: 142 mg/dL (ref <200)
HDL: 41 mg/dL — ABNORMAL LOW (ref >=50)
LDL Cholesterol (Calc): 70 mg/dL (calc)
Non-HDL Cholesterol (Calc): 101 mg/dL (calc) (ref <130)
Total CHOL/HDL Ratio: 3.5 (calc) (ref <5.0)
Triglycerides: 212 mg/dL — ABNORMAL HIGH (ref <150)

## 2019-05-24 LAB — HEPATITIS C ANTIBODY
Hepatitis C Ab: NONREACTIVE
SIGNAL TO CUT-OFF: 0.03 (ref <1.00)

## 2019-05-24 LAB — HEMOGLOBIN A1C
Hgb A1c MFr Bld: 11.2 % of total Hgb — ABNORMAL HIGH (ref <5.7)
Mean Plasma Glucose: 275 (calc)
eAG (mmol/L): 15.2 (calc)

## 2019-05-24 LAB — VITAMIN D 25 HYDROXY (VIT D DEFICIENCY, FRACTURES): Vit D, 25-Hydroxy: 52 ng/mL (ref 30–100)

## 2019-05-24 MED ORDER — TRULICITY 0.75 MG/0.5ML ~~LOC~~ SOAJ: 0.7500 mg | SUBCUTANEOUS | 3 refills | Status: DC

## 2019-05-25 NOTE — Progress Notes (Signed)
Called pt this morning . No answer to call. Will try back again on Monday.

## 2019-05-25 NOTE — Progress Notes (Signed)
Patient called. No answer this morning. Will try again on Monday.

## 2019-05-28 NOTE — Progress Notes (Signed)
PT WAS CALLED TODAY. WAS ABLE TO LEAVE A DETAIL MESSAGE TO HER MOBILE PHONE OF RESULTS & INSTRUCTIONS.

## 2019-05-30 ENCOUNTER — Other Ambulatory Visit (INDEPENDENT_AMBULATORY_CARE_PROVIDER_SITE_OTHER): Payer: Self-pay | Admitting: Internal Medicine

## 2019-05-30 ENCOUNTER — Other Ambulatory Visit (INDEPENDENT_AMBULATORY_CARE_PROVIDER_SITE_OTHER): Payer: Self-pay

## 2019-05-30 MED ORDER — LANTUS SOLOSTAR 100 UNIT/ML ~~LOC~~ SOPN: 62.0000 [IU] | PEN_INJECTOR | Freq: Every day | SUBCUTANEOUS | 3 refills | Status: DC

## 2019-05-30 NOTE — Progress Notes (Unsigned)
Pt called and said it was getting very low on her insulin since the change. Se will need a refill with new Rx dose ;pt is taking 62 units daily at bedtime.

## 2019-05-31 ENCOUNTER — Ambulatory Visit (INDEPENDENT_AMBULATORY_CARE_PROVIDER_SITE_OTHER): Payer: Medicare PPO | Admitting: Orthotics

## 2019-05-31 ENCOUNTER — Ambulatory Visit: Payer: Medicare PPO | Admitting: Podiatry

## 2019-05-31 ENCOUNTER — Other Ambulatory Visit: Payer: Self-pay

## 2019-05-31 ENCOUNTER — Encounter: Payer: Self-pay | Admitting: Podiatry

## 2019-05-31 DIAGNOSIS — L97522 Non-pressure chronic ulcer of other part of left foot with fat layer exposed: Secondary | ICD-10-CM

## 2019-05-31 DIAGNOSIS — E1149 Type 2 diabetes mellitus with other diabetic neurological complication: Secondary | ICD-10-CM

## 2019-05-31 DIAGNOSIS — L97512 Non-pressure chronic ulcer of other part of right foot with fat layer exposed: Secondary | ICD-10-CM

## 2019-06-04 ENCOUNTER — Other Ambulatory Visit: Payer: Self-pay

## 2019-06-04 ENCOUNTER — Ambulatory Visit (HOSPITAL_COMMUNITY)
Admission: RE | Admit: 2019-06-04 | Discharge: 2019-06-04 | Disposition: A | Discharge status: Home / Self Care | Payer: Medicare PPO | Source: Ambulatory Visit | Attending: Obstetrics & Gynecology | Admitting: Obstetrics & Gynecology

## 2019-06-04 DIAGNOSIS — Z1231 Encounter for screening mammogram for malignant neoplasm of breast: Secondary | ICD-10-CM | POA: Insufficient documentation

## 2019-06-11 NOTE — Progress Notes (Signed)
Subjective: 58 year old female presents the office today for follow-up evaluation of a wound on the ball of her right foot.  Patient has noticed some soreness coming from the area but there is not any significant drainage there is been no pus.  No swelling or redness.  They have been keeping dressings on the wound daily.  They are applying Prisma.  She also presents today to pick up her brace with Liliane Channel.  Denies any systemic complaints such as fevers, chills, nausea, vomiting. No acute changes since last appointment, and no other complaints at this time.   Objective: AAO x3, NAD DP/PT pulses palpable bilaterally, CRT less than 3 seconds Ulceration continues ongoing right foot submetatarsal 5.  Granular wound base is present with small hyperkeratotic periwound with some macerated tissue on the periwound. Hair is present in the wound.  After debridement today the wound measures the same size of approximately 1 x 1 x 0.1 cm.  No probing to bone, undermining or tunneling.  No sign erythema, ascending cellulitis.  No fluctuation crepitation.  Prominent metatarsal head plantarly.  Dropfoot left side.  No pain with calf compression, swelling, warmth, erythema  Assessment: Right foot ulceration; dropfoot left side  Plan: -All treatment options discussed with the patient including all alternatives, risks, complications.  -I debrided the wound #312 with scalpel that any complications or bleeding.  Will continue with Prisma dressing changes daily.   -Continue offloading on the right foot daily.  Work to further modify the orthotic.  AFO dispensed in the left side by Liliane Channel.  -If no improvement then we will refer to the wound care center. -Monitor for any clinical signs or symptoms of infection and directed to call the office immediately should any occur or go to the ER.  Trula Slade DPM

## 2019-06-14 ENCOUNTER — Other Ambulatory Visit: Payer: Self-pay

## 2019-06-14 ENCOUNTER — Ambulatory Visit: Payer: Medicare PPO | Admitting: Orthotics

## 2019-06-14 ENCOUNTER — Ambulatory Visit: Payer: Medicare PPO | Admitting: Podiatry

## 2019-06-14 DIAGNOSIS — L97512 Non-pressure chronic ulcer of other part of right foot with fat layer exposed: Secondary | ICD-10-CM

## 2019-06-14 DIAGNOSIS — E1149 Type 2 diabetes mellitus with other diabetic neurological complication: Secondary | ICD-10-CM

## 2019-06-18 ENCOUNTER — Ambulatory Visit (INDEPENDENT_AMBULATORY_CARE_PROVIDER_SITE_OTHER): Payer: Medicare PPO | Admitting: Nurse Practitioner

## 2019-06-19 ENCOUNTER — Encounter (INDEPENDENT_AMBULATORY_CARE_PROVIDER_SITE_OTHER): Payer: Self-pay | Admitting: Nurse Practitioner

## 2019-06-19 ENCOUNTER — Ambulatory Visit (INDEPENDENT_AMBULATORY_CARE_PROVIDER_SITE_OTHER): Payer: Medicare PPO | Admitting: Nurse Practitioner

## 2019-06-19 ENCOUNTER — Other Ambulatory Visit: Payer: Self-pay

## 2019-06-19 VITALS — BP 148/62 | HR 62 | Temp 96.8°F | Resp 14 | Ht 66.5 in | Wt 241.2 lb

## 2019-06-19 DIAGNOSIS — E1165 Type 2 diabetes mellitus with hyperglycemia: Secondary | ICD-10-CM

## 2019-06-19 DIAGNOSIS — Z0001 Encounter for general adult medical examination with abnormal findings: Secondary | ICD-10-CM | POA: Diagnosis not present

## 2019-06-19 DIAGNOSIS — Z0000 Encounter for general adult medical examination without abnormal findings: Secondary | ICD-10-CM

## 2019-06-19 DIAGNOSIS — I10 Essential (primary) hypertension: Secondary | ICD-10-CM | POA: Diagnosis not present

## 2019-06-19 MED ORDER — BLOOD PRESSURE MONITOR AUTOMAT DEVI: 0 refills | Status: DC

## 2019-06-19 NOTE — Progress Notes (Signed)
Subjective:   Melanie Spencer is a 58 y.o. female who presents for Medicare Annual (Subsequent) preventive examination.   Objective:     Vitals: BP (!) 168/72   Pulse 62   Temp (!) 96.8 F (36 C)   Resp 14   Ht 5' 6.5" (1.689 m)   Wt 241 lb 3.2 oz (109.4 kg)   LMP 03/02/2012   BMI 38.35 kg/m   Body mass index is 38.35 kg/m.  Advanced Directives 06/19/2019  Does Patient Have a Medical Advance Directive? No  Would patient like information on creating a medical advance directive? Yes (Inpatient - patient defers creating a medical advance directive at this time - Information given)    Tobacco Social History   Tobacco Use  Smoking Status Former Smoker  . Packs/day: 2.00  . Years: 20.00  . Pack years: 40.00  . Types: Cigarettes  . Quit date: 07/26/2005  . Years since quitting: 13.9  Smokeless Tobacco Never Used     Counseling given: No   Clinical Intake:  Pre-visit preparation completed: Yes  Pain : 0-10 Pain Score: 1  Pain Type: Chronic pain Pain Location: Shoulder Pain Orientation: Left Pain Descriptors / Indicators: Sharp Pain Onset: Other (comment)(Since 2007) Pain Frequency: Occasional Pain Relieving Factors: Rest Effect of Pain on Daily Activities: Difficult to get dressed  Pain Relieving Factors: Rest  Nutritional Risks: None Diabetes: Yes CBG done?: No Did pt. bring in CBG monitor from home?: No  How often do you need to have someone help you when you read instructions, pamphlets, or other written materials from your doctor or pharmacy?: 1 - Never What is the last grade level you completed in school?: 13  Interpreter Needed?: No     Past Medical History:  Diagnosis Date  . Cerebrovascular disease 05/23/2019  . Essential hypertension, benign 05/23/2019  . HLD (hyperlipidemia) 05/23/2019  . Morbid obesity (HCC) 05/23/2019  . Stroke (HCC)   . Type II diabetes mellitus, uncontrolled (HCC) 05/23/2019  . Vitamin D deficiency    Past Surgical  History:  Procedure Laterality Date  . CHOLECYSTECTOMY     Family History  Problem Relation Age of Onset  . Heart disease Mother   . Diabetes Mother   . Hypertension Mother   . Cancer Father        Lung  . Hypertension Sister   . Macular degeneration Sister   . Appendicitis Brother   . Hypertension Sister   . Diabetes Sister    Social History   Socioeconomic History  . Marital status: Married    Spouse name: Not on file  . Number of children: Not on file  . Years of education: 2413  . Highest education level: Some college, no degree  Occupational History  . Occupation: Disability    Comment: Used to work as Consulting civil engineerinsurance agent  Social Needs  . Financial resource strain: Not hard at all  . Food insecurity    Worry: Never true    Inability: Never true  . Transportation needs    Medical: No    Non-medical: No  Tobacco Use  . Smoking status: Former Smoker    Packs/day: 2.00    Years: 20.00    Pack years: 40.00    Types: Cigarettes    Quit date: 07/26/2005    Years since quitting: 13.9  . Smokeless tobacco: Never Used  Substance and Sexual Activity  . Alcohol use: No    Alcohol/week: 0.0 standard drinks  . Drug use: No  .  Sexual activity: Not Currently    Birth control/protection: Post-menopausal  Lifestyle  . Physical activity    Days per week: 2 days    Minutes per session: 30 min  . Stress: Not at all  Relationships  . Social Musician on phone: Not on file    Gets together: Not on file    Attends religious service: Not on file    Active member of club or organization: Not on file    Attends meetings of clubs or organizations: Not on file    Relationship status: Not on file  Other Topics Concern  . Not on file  Social History Narrative   Married for 31 yrs.On disability secondary to CVA.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    Outpatient Encounter Medications as of 06/19/2019  Medication Sig  . aspirin 325 MG tablet Take 325 mg by mouth daily.   . cholecalciferol (VITAMIN D) 1000 UNITS tablet Take 5,000 Units by mouth daily.   . Dulaglutide (TRULICITY) 0.75 MG/0.5ML SOPN Inject 0.75 mg into the skin once a week.  Marland Kitchen LANTUS SOLOSTAR 100 UNIT/ML Solostar Pen Inject 62 Units into the skin at bedtime.  Marland Kitchen omeprazole (PRILOSEC) 20 MG capsule TAKE 1 TABLET BY MOUTH DAILY  . oxybutynin (DITROPAN) 5 MG tablet TAKE 1 TABLET BY MOUTH TWICE DAILY  . ramipril (ALTACE) 10 MG capsule TAKE 2 CAPSULES BY MOUTH EVERY DAY  . simvastatin (ZOCOR) 40 MG tablet Take 1 tablet (40 mg total) by mouth daily at 6 PM.  . fluticasone (FLONASE) 50 MCG/ACT nasal spray SHAKE LIQUID AND USE 2 SPRAYS IN EACH NOSTRIL EVERY DAY  . gabapentin (NEURONTIN) 300 MG capsule TAKE 2 CAPSULES BY MOUTH THREE TIMES DAILY  . JANUVIA 100 MG tablet TAKE 1 TABLET BY MOUTH DAILY (Patient taking differently: Take 100 mg by mouth daily. )  . metFORMIN (GLUCOPHAGE) 1000 MG tablet TAKE 1 TABLET BY MOUTH TWICE DAILY  . [DISCONTINUED] fluticasone (FLONASE) 50 MCG/ACT nasal spray Place 1 spray into both nostrils daily.  . [DISCONTINUED] gabapentin (NEURONTIN) 300 MG capsule Take 300 mg by mouth 2 (two) times daily.  . [DISCONTINUED] metFORMIN (GLUCOPHAGE) 1000 MG tablet 1,000 mg 2 (two) times daily with a meal.    No facility-administered encounter medications on file as of 06/19/2019.     Activities of Daily Living In your present state of health, do you have any difficulty performing the following activities: 06/19/2019  Hearing? N  Vision? N  Difficulty concentrating or making decisions? N  Walking or climbing stairs? N  Dressing or bathing? Y  Doing errands, shopping? Y  Some recent data might be hidden    Patient Care Team: [name], MD as PCP - General (Internal Medicine)    Assessment:   This is a routine wellness examination for [name].  Exercise Activities and Dietary recommendations    Goals   None     Fall Risk Fall Risk  06/19/2019 07/11/2018  Falls in  the past year? 0 0  Risk for fall due to : History of fall(s);Impaired balance/gait -   Is the patient's home free of loose throw rugs in walkways, pet beds, electrical cords, etc?   no      Grab bars in the bathroom? yes      Handrails on the stairs?   yes      Adequate lighting?   yes  Timed Get Up and Go performed: 45   Depression Screen PHQ 2/9 Scores 06/19/2019 07/11/2018  PHQ -  2 Score 0 0     Cognitive Function     6CIT Screen 06/19/2019  What Year? 0 points  What month? 0 points  What time? 0 points  Count back from 20 0 points  Months in reverse 2 points  Repeat phrase 0 points  Total Score 2    Immunization History  Administered Date(s) Administered  . Influenza,inj,Quad PF,6+ Mos 05/23/2019  . Pneumococcal Polysaccharide-23 05/30/2012, 08/26/2018  . Td 07/26/2014     Screening Tests Health Maintenance  Topic Date Due  . FOOT EXAM  07/25/1970  . TETANUS/TDAP  07/26/1979  . OPHTHALMOLOGY EXAM  07/30/2019 (Originally 07/25/1970)  . HIV Screening  05/22/2020 (Originally 07/26/1975)  . COLONOSCOPY  06/18/2020 (Originally 07/25/2010)  . HEMOGLOBIN A1C  11/21/2019  . MAMMOGRAM  06/03/2021  . PAP SMEAR-Modifier  07/11/2021  . INFLUENZA VACCINE  Completed  . PNEUMOCOCCAL POLYSACCHARIDE VACCINE AGE 25-64 HIGH RISK  Completed  . Hepatitis C Screening  Completed    Cancer Screenings: Lung: Low Dose CT Chest recommended if Age 42-80 years, 30 pack-year currently smoking OR have quit w/in 15years. Patient does qualify.-- Patient is not interested in undergoing this screening at this time.  Breast:  Up to date on Mammogram? Yes   Up to date of Bone Density/Dexa? Yes Colorectal: She is due for this. She tells me that she will call her GI to get this set up  Additional Screenings:  Hepatitis C Screening: Completed and she was negative     Plan:   Patient is up-to-date with all immunizations.  She is up-to-date with cervical cancer screening, needs colon cancer  screening (she was encouraged to call her gastroenterologist to get this set up, and she tells me that she will), she does not require tobacco cessation conversation, she is up-to-date with breast cancer screening via mammogram, depression screening was negative today, hepatitis screening was completed and was negative, she would qualify for lung cancer screening with low-dose CT scan but would like to hold off on this at this time.  We also discussed advanced care planning and she does not want to implement a MOST form at this time.  We did discuss the importance of thinking about these situations and having conversations with love ones so that they know her wishes in the event she is unable to express her desires for medical care.  She tells me she understands.  She will follow-up as scheduled in 2 weeks, and then in 1 year for her annual wellness visit.  I have personally reviewed and noted the following in the patient's chart:   . Medical and social history . Use of alcohol, tobacco or illicit drugs  . Current medications and supplements . Functional ability and status . Nutritional status . Physical activity . Advanced directives . List of other physicians . Hospitalizations, surgeries, and ER visits in previous 12 months . Vitals . Screenings to include cognitive, depression, and falls . Referrals and appointments  In addition, I have reviewed and discussed with patient certain preventive protocols, quality metrics, and best practice recommendations. A written personalized care plan for preventive services as well as general preventive health recommendations were provided to patient.     Ailene Ards, NP  06/19/2019

## 2019-06-19 NOTE — Progress Notes (Signed)
Subjective:  Patient ID: Melanie Spencer, female    DOB: [birth year]  Age: 58 y.o. MRN: [id number]  CC:  Chief Complaint  Patient presents with  . Hypertension  . Diabetes      HPI  Hypertension: This patient has a history of hypertension and currently takes ramipril 20 mg daily.  She does not have an at home blood pressure cuff so she does not monitor her blood pressure at home.  She does tell me that she gets nervous when she comes to the doctor's office.  Type 2 diabetes: This patient has a history of type 2 diabetes which is not controlled.  She is currently on Metformin, Januvia, Trulicity, Lantus.  Last A1c was collected in October 2020 and was 11.2.  The patient tells me since starting Trulicity a couple of weeks ago that her fasting blood sugars have been much improved.  She tells me her highest fasting blood sugar has been about 170.  She does not check her blood sugar prior to meals.  She has not yet seen her eye doctor, but is planning on getting an appointment for this soon.  She is seen by podiatry as she currently has a diabetic ulcer that is being treated by wound center.  Past Medical History:  Diagnosis Date  . Cerebrovascular disease 05/23/2019  . Essential hypertension, benign 05/23/2019  . HLD (hyperlipidemia) 05/23/2019  . Morbid obesity ([place]) 05/23/2019  . Stroke ([place])   . Type II diabetes mellitus, uncontrolled ([place]) 05/23/2019  . Vitamin D deficiency       Family History  Problem Relation Age of Onset  . Heart disease Mother   . Diabetes Mother   . Hypertension Mother   . Cancer Father        Lung  . Hypertension Sister   . Macular degeneration Sister   . Appendicitis Brother   . Hypertension Sister   . Diabetes Sister     Social History   Social History Narrative   Married for 31 yrs.On disability secondary to CVA.Previously licensed Medical illustrator.   Social History   Tobacco Use  . Smoking status: Former Smoker    Packs/day: 2.00   Years: 20.00    Pack years: 40.00    Types: Cigarettes    Quit date: 07/26/2005    Years since quitting: 13.9  . Smokeless tobacco: Never Used  Substance Use Topics  . Alcohol use: No    Alcohol/week: 0.0 standard drinks     Current Meds  Medication Sig  . aspirin 325 MG tablet Take 325 mg by mouth daily.  . cholecalciferol (VITAMIN D) 1000 UNITS tablet Take 5,000 Units by mouth daily.   . Dulaglutide (TRULICITY) 4.40 HK/7.4QV SOPN Inject 0.75 mg into the skin once a week.  Marland Kitchen LANTUS SOLOSTAR 100 UNIT/ML Solostar Pen Inject 62 Units into the skin at bedtime.  Marland Kitchen omeprazole (PRILOSEC) 20 MG capsule TAKE 1 TABLET BY MOUTH DAILY  . oxybutynin (DITROPAN) 5 MG tablet TAKE 1 TABLET BY MOUTH TWICE DAILY  . ramipril (ALTACE) 10 MG capsule TAKE 2 CAPSULES BY MOUTH EVERY DAY  . simvastatin (ZOCOR) 40 MG tablet Take 1 tablet (40 mg total) by mouth daily at 6 PM.    ROS:  Review of Systems  Constitutional: Negative for chills and fever.  Eyes: Negative for blurred vision.  Respiratory: Negative for cough, shortness of breath and wheezing.   Cardiovascular: Negative for chest pain and palpitations.  Skin:  Diabetic foot ulcer  Neurological: Negative for dizziness and headaches.     Objective:   Today's Vitals: BP (!) 168/72   Pulse 62   Temp (!) 96.8 F (36 C)   Resp 14   Ht 5' 6.5" (1.689 m)   Wt 241 lb 3.2 oz (109.4 kg)   LMP 03/02/2012   BMI 38.35 kg/m  Vitals with BMI 06/19/2019 05/23/2019 03/02/2019  Height 5' 6.5" 5' 6.5" -  Weight 241 lbs 3 oz 250 lbs -  BMI 38.35 39.75 -  Systolic 168 180 341  Diastolic 72 80 62  Pulse 62 72 68     Physical Exam Vitals signs reviewed.  Constitutional:      General: She is not in acute distress.    Appearance: Normal appearance.  HENT:     Head: Normocephalic and atraumatic.  Neck:     Vascular: No carotid bruit.  Cardiovascular:     Rate and Rhythm: Normal rate and regular rhythm.     Pulses: Normal pulses.     Heart  sounds: Normal heart sounds.  Pulmonary:     Effort: Pulmonary effort is normal.     Breath sounds: Normal breath sounds.  Skin:    General: Skin is warm and dry.  Neurological:     General: No focal deficit present.     Mental Status: She is alert and oriented to person, place, and time.  Psychiatric:        Mood and Affect: Mood normal.        Behavior: Behavior normal.        Judgment: Judgment normal.          Assessment   1. Essential hypertension, benign   2. Encounter for Medicare annual wellness exam       Tests ordered No orders of the defined types were placed in this encounter.    Plan: Please see assessment and plan per problem list below.   Meds ordered this encounter  Medications  . Blood Pressure Monitoring (BLOOD PRESSURE MONITOR AUTOMAT) DEVI    Sig: Check blood pressure at home daily    Dispense:  1 each    Refill:  0    Order Specific Question:   Supervising Provider    Answer:   [name] [[id number]]    Patient to follow-up in 2 weeks.  Elenore Paddy, NP

## 2019-06-19 NOTE — Patient Instructions (Signed)
Colonoscopy, Adult A colonoscopy is an exam to look at the entire large intestine. During the exam, a lubricated, flexible tube that has a camera on the end of it is inserted into the anus and then passed into the rectum, colon, and other parts of the large intestine. You may have a colonoscopy as a part of normal colorectal screening or if you have certain symptoms, such as:  Lack of red blood cells (anemia).  Diarrhea that does not go away.  Abdominal pain.  Blood in your stool (feces). A colonoscopy can help screen for and diagnose medical problems, including:  Tumors.  Polyps.  Inflammation.  Areas of bleeding. Tell a health care provider about:  Any allergies you have.  All medicines you are taking, including vitamins, herbs, eye drops, creams, and over-the-counter medicines.  Any problems you or family members have had with anesthetic medicines.  Any blood disorders you have.  Any surgeries you have had.  Any medical conditions you have.  Any problems you have had passing stool. What are the risks? Generally, this is a safe procedure. However, problems may occur, including:  Bleeding.  A tear in the intestine.  A reaction to medicines given during the exam.  Infection (rare). What happens before the procedure? Eating and drinking restrictions Follow instructions from your health care provider about eating and drinking, which may include:  A few days before the procedure - follow a low-fiber diet. Avoid nuts, seeds, dried fruit, raw fruits, and vegetables.  1-3 days before the procedure - follow a clear liquid diet. Drink only clear liquids, such as clear broth or bouillon, black coffee or tea, clear juice, clear soft drinks or sports drinks, gelatin dessert, and popsicles. Avoid any liquids that contain red or purple dye.  On the day of the procedure - do not eat or drink anything starting 2 hours before the procedure, or within the time period that your  health care provider recommends. Up to 2 hours before the procedure, you may continue to drink clear liquids, such as water or clear fruit juice. Bowel prep If you were prescribed an oral bowel prep to clean out your colon:  Take it as told by your health care provider. Starting the day before your procedure, you will need to drink a large amount of medicated liquid. The liquid will cause you to have multiple loose stools until your stool is almost clear or light green.  If your skin or anus gets irritated from diarrhea, you may use these to relieve the irritation: ? Medicated wipes, such as adult wet wipes with aloe and vitamin E. ? A skin-soothing product like petroleum jelly.  If you vomit while drinking the bowel prep, take a break for up to 60 minutes and then begin the bowel prep again. If vomiting continues and you cannot take the bowel prep without vomiting, call your health care provider.  To clean out your colon, you may also be given: ? Laxative medicines. ? Instructions about how to use an enema. General instructions  Ask your health care provider about: ? Changing or stopping your regular medicines or supplements. This is especially important if you are taking iron supplements, diabetes medicines, or blood thinners. ? Taking medicines such as aspirin and ibuprofen. These medicines can thin your blood. Do not take these medicines before the procedure if your health care provider tells you not to.  Plan to have someone take you home from the hospital or clinic. What happens during the  procedure?   An IV may be inserted into one of your veins.  You will be given medicine to help you relax (sedative).  To reduce your risk of infection: ? Your health care team will wash or sanitize their hands. ? Your anal area will be washed with soap.  You will be asked to lie on your side with your knees bent.  Your health care provider will lubricate a long, thin, flexible tube. The  tube will have a camera and a light on the end.  The tube will be inserted into your anus.  The tube will be gently eased through your rectum and colon.  Air will be delivered into your colon to keep it open. You may feel some pressure or cramping.  The camera will be used to take images during the procedure.  A small tissue sample may be removed to be examined under a microscope (biopsy).  If small polyps are found, your health care provider may remove them and have them checked for cancer cells.  When the exam is done, the tube will be removed. The procedure may vary among health care providers and hospitals. What happens after the procedure?  Your blood pressure, heart rate, breathing rate, and blood oxygen level will be monitored until the medicines you were given have worn off.  Do not drive for 24 hours after the exam.  You may have a small amount of blood in your stool.  You may pass gas and have mild abdominal cramping or bloating due to the air that was used to inflate your colon during the exam.  It is up to you to get the results of your procedure. Ask your health care provider, or the department performing the procedure, when your results will be ready. Summary  A colonoscopy is an exam to look at the entire large intestine.  During a colonoscopy, a lubricated, flexible tube with a camera on the end of it is inserted into the anus and then passed into the colon and other parts of the large intestine.  Follow instructions from your health care provider about eating and drinking before the procedure.  If you were prescribed an oral bowel prep to clean out your colon, take it as told by your health care provider.  After your procedure, your blood pressure, heart rate, breathing rate, and blood oxygen level will be monitored until the medicines you were given have worn off. This information is not intended to replace advice given to you by your health care provider. Make  sure you discuss any questions you have with your health care provider. Document Released: 07/09/2000 Document Revised: 05/04/2017 Document Reviewed: 09/23/2015 Elsevier Patient Education  2020 [employer] and Cholesterol Restricted Eating Plan Eating a diet that limits fat and cholesterol may help lower your risk for heart disease and other conditions. Your body needs fat and cholesterol for basic functions, but eating too much of these things can be harmful to your health. Your health care provider may order lab tests to check your blood fat (lipid) and cholesterol levels. This helps your health care provider understand your risk for certain conditions and whether you need to make diet changes. Work with your health care provider or dietitian to make an eating plan that is right for you. Your plan includes:  Limit your fat intake to ______% or less of your total calories a day.  Limit your saturated fat intake to ______% or less of your total calories  a day.  Limit the amount of cholesterol in your diet to less than _________mg a day.  Eat ___________ g of fiber a day. What are tips for following this plan? General guidelines   If you are overweight, work with your health care provider to lose weight safely. Losing just 5-10% of your body weight can improve your overall health and help prevent diseases such as diabetes and heart disease.  Avoid: ? Foods with added sugar. ? Fried foods. ? Foods that contain partially hydrogenated oils, including stick margarine, some tub margarines, cookies, crackers, and other baked goods.  Limit alcohol intake to no more than 1 drink a day for nonpregnant women and 2 drinks a day for men. One drink equals 12 oz of beer, 5 oz of wine, or 1 oz of hard liquor. Reading food labels  Check food labels for: ? Trans fats, partially hydrogenated oils, or high amounts of saturated fat. Avoid foods that contain saturated fat and trans fat. ? The  amount of cholesterol in each serving. Try to eat no more than 200 mg of cholesterol each day. ? The amount of fiber in each serving. Try to eat at least 20-30 g of fiber each day.  Choose foods with healthy fats, such as: ? Monounsaturated and polyunsaturated fats. These include olive and canola oil, flaxseeds, walnuts, almonds, and seeds. ? Omega-3 fats. These are found in foods such as salmon, mackerel, sardines, tuna, flaxseed oil, and ground flaxseeds.  Choose grain products that have whole grains. Look for the word "whole" as the first word in the ingredient list. Cooking  Cook foods using methods other than frying. Baking, boiling, grilling, and broiling are some healthy options.  Eat more home-cooked food and less restaurant, buffet, and fast food.  Avoid cooking using saturated fats. ? Animal sources of saturated fats include meats, butter, and cream. ? Plant sources of saturated fats include palm oil, palm kernel oil, and coconut oil. Meal planning   At meals, imagine dividing your plate into fourths: ? Fill one-half of your plate with vegetables and green salads. ? Fill one-fourth of your plate with whole grains. ? Fill one-fourth of your plate with lean protein foods.  Eat fish that is high in omega-3 fats at least two times a week.  Eat more foods that contain fiber, such as whole grains, beans, apples, broccoli, carrots, peas, and barley. These foods help promote healthy cholesterol levels in the blood. Recommended foods Grains  Whole grains, such as whole wheat or whole grain breads, crackers, cereals, and pasta. Unsweetened oatmeal, bulgur, barley, quinoa, or brown rice. Corn or whole wheat flour tortillas. Vegetables  Fresh or frozen vegetables (raw, steamed, roasted, or grilled). Green salads. Fruits  All fresh, canned (in natural juice), or frozen fruits. Meats and other protein foods  Ground beef (85% or leaner), grass-fed beef, or beef trimmed of fat.  Skinless chicken or [place]. Ground chicken or [place]. Pork trimmed of fat. All fish and seafood. Egg whites. Dried beans, peas, or lentils. Unsalted nuts or seeds. Unsalted canned beans. Natural nut butters without added sugar and oil. Dairy  Low-fat or nonfat dairy products, such as skim or 1% milk, 2% or reduced-fat cheeses, low-fat and fat-free ricotta or cottage cheese, or plain low-fat and nonfat yogurt. Fats and oils  Tub margarine without trans fats. Light or reduced-fat mayonnaise and salad dressings. Avocado. Olive, canola, sesame, or safflower oils. The items listed above may not be a complete list of recommended foods or  beverages. Contact your dietitian for more options. Foods to avoid Grains  White bread. White pasta. White rice. Cornbread. Bagels, pastries, and croissants. Crackers and snack foods that contain trans fat and hydrogenated oils. Vegetables  Vegetables cooked in cheese, cream, or butter sauce. Fried vegetables. Fruits  Canned fruit in heavy syrup. Fruit in cream or butter sauce. Fried fruit. Meats and other protein foods  Fatty cuts of meat. Ribs, chicken wings, bacon, sausage, bologna, salami, chitterlings, fatback, hot dogs, bratwurst, and packaged lunch meats. Liver and organ meats. Whole eggs and egg yolks. Chicken and [place] with skin. Fried meat. Dairy  Whole or 2% milk, cream, half-and-half, and cream cheese. Whole milk cheeses. Whole-fat or sweetened yogurt. Full-fat cheeses. Nondairy creamers and whipped toppings. Processed cheese, cheese spreads, and cheese curds. Beverages  Alcohol. Sugar-sweetened drinks such as sodas, lemonade, and fruit drinks. Fats and oils  Butter, stick margarine, lard, shortening, ghee, or bacon fat. Coconut, palm kernel, and palm oils. Sweets and desserts  Corn syrup, sugars, honey, and molasses. Candy. Jam and jelly. Syrup. Sweetened cereals. Cookies, pies, cakes, donuts, muffins, and ice cream. The items listed above  may not be a complete list of foods and beverages to avoid. Contact your dietitian for more information. Summary  Your body needs fat and cholesterol for basic functions. However, eating too much of these things can be harmful to your health.  Work with your health care provider and dietitian to follow a diet low in fat and cholesterol. Doing this may help lower your risk for heart disease and other conditions.  Choose healthy fats, such as monounsaturated and polyunsaturated fats, and foods high in omega-3 fatty acids.  Eat fiber-rich foods, such as whole grains, beans, peas, fruits, and vegetables.  Limit or avoid alcohol, fried foods, and foods high in saturated fats, partially hydrogenated oils, and sugar. This information is not intended to replace advice given to you by your health care provider. Make sure you discuss any questions you have with your health care provider. Document Released: 07/12/2005 Document Revised: 06/24/2017 Document Reviewed: 03/29/2017 Elsevier Patient Education  2020 Else[employer] Inc[employer]   Diabetes Mellitus and Foot Care Foot care is an important part of your health, especially when you have diabetes. Diabetes may cause you to have problems because of poor blood flow (circulation) to your feet and legs, which can cause your skin to:  Become thinner and drier.  Break more easily.  Heal more slowly.  Peel and crack. You may also have nerve damage (neuropathy) in your legs and feet, causing decreased feeling in them. This means that you may not notice minor injuries to your feet that could lead to more serious problems. Noticing and addressing any potential problems early is the best way to prevent future foot problems. How to care for your feet Foot hygiene  Wash your feet daily with warm water and mild soap. Do not use hot water. Then, pat your feet and the areas between your toes until they are completely dry. Do not soak your feet as this can dry your  skin.  Trim your toenails straight across. Do not dig under them or around the cuticle. File the edges of your nails with an emery board or nail file.  Apply a moisturizing lotion or petroleum jelly to the skin on your feet and to dry, brittle toenails. Use lotion that does not contain alcohol and is unscented. Do not apply lotion between your toes. Shoes and socks  Wear clean socks or stockings  every day. Make sure they are not too tight. Do not wear knee-high stockings since they may decrease blood flow to your legs.  Wear shoes that fit properly and have enough cushioning. Always look in your shoes before you put them on to be sure there are no objects inside.  To break in new shoes, wear them for just a few hours a day. This prevents injuries on your feet. Wounds, scrapes, corns, and calluses  Check your feet daily for blisters, cuts, bruises, sores, and redness. If you cannot see the bottom of your feet, use a mirror or ask someone for help.  Do not cut corns or calluses or try to remove them with medicine.  If you find a minor scrape, cut, or break in the skin on your feet, keep it and the skin around it clean and dry. You may clean these areas with mild soap and water. Do not clean the area with peroxide, alcohol, or iodine.  If you have a wound, scrape, corn, or callus on your foot, look at it several times a day to make sure it is healing and not infected. Check for: ? Redness, swelling, or pain. ? Fluid or blood. ? Warmth. ? Pus or a bad smell. General instructions  Do not cross your legs. This may decrease blood flow to your feet.  Do not use heating pads or hot water bottles on your feet. They may burn your skin. If you have lost feeling in your feet or legs, you may not know this is happening until it is too late.  Protect your feet from hot and cold by wearing shoes, such as at the beach or on hot pavement.  Schedule a complete foot exam at least once a year (annually)  or more often if you have foot problems. If you have foot problems, report any cuts, sores, or bruises to your health care provider immediately. Contact a health care provider if:  You have a medical condition that increases your risk of infection and you have any cuts, sores, or bruises on your feet.  You have an injury that is not healing.  You have redness on your legs or feet.  You feel burning or tingling in your legs or feet.  You have pain or cramps in your legs and feet.  Your legs or feet are numb.  Your feet always feel cold.  You have pain around a toenail. Get help right away if:  You have a wound, scrape, corn, or callus on your foot and: ? You have pain, swelling, or redness that gets worse. ? You have fluid or blood coming from the wound, scrape, corn, or callus. ? Your wound, scrape, corn, or callus feels warm to the touch. ? You have pus or a bad smell coming from the wound, scrape, corn, or callus. ? You have a fever. ? You have a red line going up your leg. Summary  Check your feet every day for cuts, sores, red spots, swelling, and blisters.  Moisturize feet and legs daily.  Wear shoes that fit properly and have enough cushioning.  If you have foot problems, report any cuts, sores, or bruises to your health care provider immediately.  Schedule a complete foot exam at least once a year (annually) or more often if you have foot problems. This information is not intended to replace advice given to you by your health care provider. Make sure you discuss any questions you have with your health care provider.  Document Released: 07/09/2000 Document Revised: 08/24/2017 Document Reviewed: 08/13/2016 Elsevier Patient Education  2020 [employer].   Fall Prevention in the Home, Adult Falls can cause injuries. They can happen to people of all ages. There are many things you can do to make your home safe and to help prevent falls. Ask for help when making these  changes, if needed. What actions can I take to prevent falls? General Instructions  Use good lighting in all rooms. Replace any light bulbs that burn out.  Turn on the lights when you go into a dark area. Use night-lights.  Keep items that you use often in easy-to-reach places. Lower the shelves around your home if necessary.  Set up your furniture so you have a clear path. Avoid moving your furniture around.  Do not have throw rugs and other things on the floor that can make you trip.  Avoid walking on wet floors.  If any of your floors are uneven, fix them.  Add color or contrast paint or tape to clearly mark and help you see: ? Any grab bars or handrails. ? First and last steps of stairways. ? Where the edge of each step is.  If you use a stepladder: ? Make sure that it is fully opened. Do not climb a closed stepladder. ? Make sure that both sides of the stepladder are locked into place. ? Ask someone to hold the stepladder for you while you use it.  If there are any pets around you, be aware of where they are. What can I do in the bathroom?      Keep the floor dry. Clean up any water that spills onto the floor as soon as it happens.  Remove soap buildup in the tub or shower regularly.  Use non-skid mats or decals on the floor of the tub or shower.  Attach bath mats securely with double-sided, non-slip rug tape.  If you need to sit down in the shower, use a plastic, non-slip stool.  Install grab bars by the toilet and in the tub and shower. Do not use towel bars as grab bars. What can I do in the bedroom?  Make sure that you have a light by your bed that is easy to reach.  Do not use any sheets or blankets that are too big for your bed. They should not hang down onto the floor.  Have a firm chair that has side arms. You can use this for support while you get dressed. What can I do in the kitchen?  Clean up any spills right away.  If you need to reach  something above you, use a strong step stool that has a grab bar.  Keep electrical cords out of the way.  Do not use floor polish or wax that makes floors slippery. If you must use wax, use non-skid floor wax. What can I do with my stairs?  Do not leave any items on the stairs.  Make sure that you have a light switch at the top of the stairs and the bottom of the stairs. If you do not have them, ask someone to add them for you.  Make sure that there are handrails on both sides of the stairs, and use them. Fix handrails that are broken or loose. Make sure that handrails are as long as the stairways.  Install non-slip stair treads on all stairs in your home.  Avoid having throw rugs at the top or bottom of the stairs. If  you do have throw rugs, attach them to the floor with carpet tape.  Choose a carpet that does not hide the edge of the steps on the stairway.  Check any carpeting to make sure that it is firmly attached to the stairs. Fix any carpet that is loose or worn. What can I do on the outside of my home?  Use bright outdoor lighting.  Regularly fix the edges of walkways and driveways and fix any cracks.  Remove anything that might make you trip as you walk through a door, such as a raised step or threshold.  Trim any bushes or trees on the path to your home.  Regularly check to see if handrails are loose or broken. Make sure that both sides of any steps have handrails.  Install guardrails along the edges of any raised decks and porches.  Clear walking paths of anything that might make someone trip, such as tools or rocks.  Have any leaves, snow, or ice cleared regularly.  Use sand or salt on walking paths during winter.  Clean up any spills in your garage right away. This includes grease or oil spills. What other actions can I take?  Wear shoes that: ? Have a low heel. Do not wear high heels. ? Have rubber bottoms. ? Are comfortable and fit you well. ? Are closed  at the toe. Do not wear open-toe sandals.  Use tools that help you move around (mobility aids) if they are needed. These include: ? Canes. ? Walkers. ? Scooters. ? Crutches.  Review your medicines with your doctor. Some medicines can make you feel dizzy. This can increase your chance of falling. Ask your doctor what other things you can do to help prevent falls. Where to find more information  Centers for Disease Control and Prevention, [place]: HealthcareCounselor.com.pt  [employer] on Aging: RingConnections.si Contact a doctor if:  You are afraid of falling at home.  You feel weak, drowsy, or dizzy at home.  You fall at home. Summary  There are many simple things that you can do to make your home safe and to help prevent falls.  Ways to make your home safe include removing tripping hazards and installing grab bars in the bathroom.  Ask for help when making these changes in your home. This information is not intended to replace advice given to you by your health care provider. Make sure you discuss any questions you have with your health care provider. Document Released: 05/08/2009 Document Revised: 11/02/2018 Document Reviewed: 02/24/2017 Elsevier Patient Education  2020 [employer].   Health Maintenance, Female Adopting a healthy lifestyle and getting preventive care are important in promoting health and wellness. Ask your health care provider about:  The right schedule for you to have regular tests and exams.  Things you can do on your own to prevent diseases and keep yourself healthy. What should I know about diet, weight, and exercise? Eat a healthy diet   Eat a diet that includes plenty of vegetables, fruits, low-fat dairy products, and lean protein.  Do not eat a lot of foods that are high in solid fats, added sugars, or sodium. Maintain a healthy weight Body mass index (BMI) is used to identify weight problems. It estimates body fat based on height and  weight. Your health care provider can help determine your BMI and help you achieve or maintain a healthy weight. Get regular exercise Get regular exercise. This is one of the most important things you can do for  your health. Most adults should:  Exercise for at least 150 minutes each week. The exercise should increase your heart rate and make you sweat (moderate-intensity exercise).  Do strengthening exercises at least twice a week. This is in addition to the moderate-intensity exercise.  Spend less time sitting. Even light physical activity can be beneficial. Watch cholesterol and blood lipids Have your blood tested for lipids and cholesterol at 58 years of age, then have this test every 5 years. Have your cholesterol levels checked more often if:  Your lipid or cholesterol levels are high.  You are older than 59 years of age.  You are at high risk for heart disease. What should I know about cancer screening? Depending on your health history and family history, you may need to have cancer screening at various ages. This may include screening for:  Breast cancer.  Cervical cancer.  Colorectal cancer.  Skin cancer.  Lung cancer. What should I know about heart disease, diabetes, and high blood pressure? Blood pressure and heart disease  High blood pressure causes heart disease and increases the risk of stroke. This is more likely to develop in people who have high blood pressure readings, are of African descent, or are overweight.  Have your blood pressure checked: ? Every 3-5 years if you are 75-57 years of age. ? Every year if you are 107 years old or older. Diabetes Have regular diabetes screenings. This checks your fasting blood sugar level. Have the screening done:  Once every three years after age 62 if you are at a normal weight and have a low risk for diabetes.  More often and at a younger age if you are overweight or have a high risk for diabetes. What should I know  about preventing infection? Hepatitis B If you have a higher risk for hepatitis B, you should be screened for this virus. Talk with your health care provider to find out if you are at risk for hepatitis B infection. Hepatitis C Testing is recommended for:  Everyone born from [birth year] through 1966.  Anyone with known risk factors for hepatitis C. Sexually transmitted infections (STIs)  Get screened for STIs, including gonorrhea and chlamydia, if: ? You are sexually active and are younger than 59 years of age. ? You are older than 59 years of age and your health care provider tells you that you are at risk for this type of infection. ? Your sexual activity has changed since you were last screened, and you are at increased risk for chlamydia or gonorrhea. Ask your health care provider if you are at risk.  Ask your health care provider about whether you are at high risk for HIV. Your health care provider may recommend a prescription medicine to help prevent HIV infection. If you choose to take medicine to prevent HIV, you should first get tested for HIV. You should then be tested every 3 months for as long as you are taking the medicine. Pregnancy  If you are about to stop having your period (premenopausal) and you may become pregnant, seek counseling before you get pregnant.  Take 400 to 800 micrograms (mcg) of folic acid every day if you become pregnant.  Ask for birth control (contraception) if you want to prevent pregnancy. Osteoporosis and menopause Osteoporosis is a disease in which the bones lose minerals and strength with aging. This can result in bone fractures. If you are 2 years old or older, or if you are at risk for osteoporosis and fractures,  ask your health care provider if you should:  Be screened for bone loss.  Take a calcium or vitamin D supplement to lower your risk of fractures.  Be given hormone replacement therapy (HRT) to treat symptoms of menopause. Follow these  instructions at home: Lifestyle  Do not use any products that contain nicotine or tobacco, such as cigarettes, e-cigarettes, and chewing tobacco. If you need help quitting, ask your health care provider.  Do not use street drugs.  Do not share needles.  Ask your health care provider for help if you need support or information about quitting drugs. Alcohol use  Do not drink alcohol if: ? Your health care provider tells you not to drink. ? You are pregnant, may be pregnant, or are planning to become pregnant.  If you drink alcohol: ? Limit how much you use to 0-1 drink a day. ? Limit intake if you are breastfeeding.  Be aware of how much alcohol is in your drink. In the [place] one drink equals one 12 oz bottle of beer (355 mL), one 5 oz glass of wine (148 mL), or one 1 oz glass of hard liquor (44 mL). General instructions  Schedule regular health, dental, and eye exams.  Stay current with your vaccines.  Tell your health care provider if: ? You often feel depressed. ? You have ever been abused or do not feel safe at home. Summary  Adopting a healthy lifestyle and getting preventive care are important in promoting health and wellness.  Follow your health care provider's instructions about healthy diet, exercising, and getting tested or screened for diseases.  Follow your health care provider's instructions on monitoring your cholesterol and blood pressure. This information is not intended to replace advice given to you by your health care provider. Make sure you discuss any questions you have with your health care provider. Document Released: 01/25/2011 Document Revised: 07/05/2018 Document Reviewed: 07/05/2018 Elsevier Patient Education  2020 [employer].

## 2019-06-19 NOTE — Assessment & Plan Note (Signed)
Patient will continue on current medication regimen at this time.  Systolic blood pressure was improved on second reading.  I have prescribed a at home automated blood pressure cuff and have asked her to record her blood pressure daily.  She will follow-up in 2 weeks for virtual visit to see how her blood pressures are running at home.

## 2019-06-19 NOTE — Assessment & Plan Note (Signed)
She has had modest weight loss since starting Trulicity of about 9 pounds.  I have asked her to start checking her blood sugar 4 times a day.  Fasting, prelunch, predinner, and at bedtime.  She tells me she will do this and write this down over the next 2 weeks and then we will look at her blood sugar numbers at her next office visit to determine if we need to change her medication.  She will look into getting an eye doctor appointment soon.

## 2019-06-20 NOTE — Progress Notes (Signed)
Subjective: 58 year old female presents the office today for follow-up evaluation of a wound on the ball of her right foot.  Overall her husband states that he thinks the wound looks better and they have no new concerns.  Denies any drainage or pus and denies any swelling or redness.  She not having any significant discomfort at this time.  She thinks that she had been walking better since wearing the brace on the left foot.  We did modify the orthotic on the right side of the wound.  She denies any fevers, chills, nausea, vomiting.  No calf pain, chest pain, shortness of breath.  No other concerns today.   Objective: AAO x3, NAD DP/PT pulses palpable bilaterally, CRT less than 3 seconds Ulceration continues ongoing right foot submetatarsal 5.  Granular wound base is present with small hyperkeratotic periwound.  The wound appears to be cleaner today and more granular tissue.  No surrounding erythema, ascending cellulitis.  No fluctuation crepitation or any malodor.  Today the wound measures about the same size 1 x 0.9 x 0.1 cm.  There is no probing to bone, undermining or tunneling. No pain with calf compression, swelling, warmth, erythema  Assessment: Right foot ulceration; dropfoot left side  Plan: -All treatment options discussed with the patient including all alternatives, risks, complications.  -I debrided the wound #312 with scalpel that any complications or bleeding.  Will continue with Prisma dressing changes daily.   -Continue offloading on the right foot daily.  Rick further modify the orthotic today.  Continue AFO as this appears to be fitting well. -If no improvement then we will refer to the wound care center.  They wanted to hold off today but will reevaluate no healing.  We discussed surgical intervention as well to remove the fifth metatarsal head but she declines. -Monitor for any clinical signs or symptoms of infection and directed to call the office immediately should any occur or  go to the ER.  Trula Slade DPM

## 2019-06-23 ENCOUNTER — Other Ambulatory Visit (INDEPENDENT_AMBULATORY_CARE_PROVIDER_SITE_OTHER): Payer: Self-pay | Admitting: Internal Medicine

## 2019-06-25 ENCOUNTER — Other Ambulatory Visit (INDEPENDENT_AMBULATORY_CARE_PROVIDER_SITE_OTHER): Payer: Self-pay | Admitting: Internal Medicine

## 2019-06-25 MED ORDER — RAMIPRIL 10 MG PO CAPS: 20.0000 mg | ORAL_CAPSULE | Freq: Every day | ORAL | 0 refills | Status: DC

## 2019-06-26 NOTE — Progress Notes (Signed)
Patient came in today to pick up standard articulated Afo brace.  Patient was evaluated for fit and function.   The brace fit very well and there were any complaints of the way it felt once donned.  The brace offered ankle stability in both saggital and coroneal planes.  Patient advised to always wear proper fitting shoes with brace.

## 2019-06-28 ENCOUNTER — Ambulatory Visit: Payer: Medicare PPO | Admitting: Podiatry

## 2019-06-28 ENCOUNTER — Other Ambulatory Visit: Payer: Self-pay

## 2019-06-28 DIAGNOSIS — L97512 Non-pressure chronic ulcer of other part of right foot with fat layer exposed: Secondary | ICD-10-CM

## 2019-06-28 DIAGNOSIS — E1149 Type 2 diabetes mellitus with other diabetic neurological complication: Secondary | ICD-10-CM

## 2019-06-28 DIAGNOSIS — M216X1 Other acquired deformities of right foot: Secondary | ICD-10-CM

## 2019-06-28 NOTE — Progress Notes (Signed)
Subjective: 58 year old female presents the office today for follow-up evaluation of a wound on the ball of her right foot.  Her husband states that since he started coming the wound has been looking much better and overall the foot looks better.  She is also walking better since wearing the brace.  Denies any swelling or redness of the foot. Denies any fevers, chills, nausea, vomiting.  No calf pain, chest pain, shortness of breath.  No other concerns today.   Objective: AAO x3, NAD DP/PT pulses palpable bilaterally, CRT less than 3 seconds Ulceration continues ongoing right foot submetatarsal 5.  Granular wound base is present with small hyperkeratotic periwound.  Minimal hyperkeratotic periwound and overall the wound base is granular.  The wound slightly larger today measuring 1.2 x 0.1 x 0.1 cm.  The central aspect is a small depth to the 0.3 cm but there is no probing to bone, undermining or tunneling.  No surrounding erythema, ascending cellulitis.  No fluctuation or crepitation.  No malodor. No pain with calf compression, swelling, warmth, erythema  Assessment: Right foot ulceration; dropfoot left side  Plan: -All treatment options discussed with the patient including all alternatives, risks, complications.  -I debrided the wound #312 with scalpel that any complications or bleeding.  Will continue with Prisma dressing changes daily.   -Continue offloading at all times.  We discussed evaluation of the wound care center as I would like to get a second opinion.  They were hesitant to do that so therefore we will have him follow with Dr. Posey Pronto for further evaluation and treatment. -Monitor for any clinical signs or symptoms of infection and directed to call the office immediately should any occur or go to the ER.  Return in about 2 weeks (around 07/12/2019).  Trula Slade DPM

## 2019-07-02 ENCOUNTER — Telehealth (INDEPENDENT_AMBULATORY_CARE_PROVIDER_SITE_OTHER): Payer: Self-pay

## 2019-07-02 ENCOUNTER — Other Ambulatory Visit (INDEPENDENT_AMBULATORY_CARE_PROVIDER_SITE_OTHER): Payer: Self-pay | Admitting: Internal Medicine

## 2019-07-02 MED ORDER — TRULICITY 0.75 MG/0.5ML ~~LOC~~ SOAJ: 0.7500 mg | SUBCUTANEOUS | 3 refills | Status: DC

## 2019-07-02 NOTE — Telephone Encounter (Signed)
I have sent Trulicity at the previous dose to her pharmacy, [place].

## 2019-07-04 ENCOUNTER — Telehealth (INDEPENDENT_AMBULATORY_CARE_PROVIDER_SITE_OTHER): Payer: Medicare PPO | Admitting: Nurse Practitioner

## 2019-07-05 ENCOUNTER — Telehealth (INDEPENDENT_AMBULATORY_CARE_PROVIDER_SITE_OTHER): Payer: Self-pay | Admitting: Nurse Practitioner

## 2019-07-05 NOTE — Telephone Encounter (Signed)
I called this patient today to notify her that we are still waiting on prior authorization for her Trulicity.  She tells me she understands and I told her we would let her know once we receive authorization approval or denial.

## 2019-07-05 NOTE — Telephone Encounter (Signed)
[employer] has approved the medication. I did call this patient's pharmacy and they tell me they will have the medication in stock tomorrow (07/06/19). I attempted to call the patient, but she did not answer and voicemail is full so I was unable to leave a message.

## 2019-07-12 NOTE — Progress Notes (Signed)

## 2019-07-13 ENCOUNTER — Ambulatory Visit: Payer: Medicare PPO | Admitting: Podiatry

## 2019-07-17 ENCOUNTER — Other Ambulatory Visit: Payer: Self-pay

## 2019-07-17 ENCOUNTER — Telehealth (INDEPENDENT_AMBULATORY_CARE_PROVIDER_SITE_OTHER): Payer: Medicare PPO | Admitting: Nurse Practitioner

## 2019-07-17 DIAGNOSIS — I10 Essential (primary) hypertension: Secondary | ICD-10-CM | POA: Diagnosis not present

## 2019-07-17 DIAGNOSIS — E1165 Type 2 diabetes mellitus with hyperglycemia: Secondary | ICD-10-CM

## 2019-07-17 NOTE — Assessment & Plan Note (Signed)
With the addition of Trulicity her fasting blood sugars seem to be much better controlled, in fact she is been having some hypoglycemic events because she does report having the shakes intermittently when her blood sugars are below 70.  I have told her to reduce her Lantus from 60 units down to 62 units.  I have told her to check a fasting blood sugar for the next 3 days and if her blood sugar fasting continues to be less than 80 then she should further reduce her Lantus down to 58 units.  She tells me she understands.  I have also told her to stop taking her Januvia since she is on Trulicity currently.  She tells me she understands.  I again told her that I would like her to check her blood sugar 4 times a day and keep a log of this over the next week.  We discussed the importance of her taking her health seriously and being an active participant in her health care.  She tells me she understands and she will check her blood sugar 4 times a day over the next week.  She will follow-up in approximately 2 weeks virtually at which time we will see how her blood sugars have been running with the reduction in Lantus as well as stopping Januvia.  Further recommendations may be made based upon that time.  Otherwise she will continue on her current medication regimen.

## 2019-07-17 NOTE — Progress Notes (Signed)
Due to national recommendations of social distancing related to the COVID19 pandemic, an audio/visual tele-health visit was felt to be the most appropriate encounter type for this patient today. I connected with  Melanie Spencer on 07/17/19 utilizing audio-only technology and verified that I am speaking with the correct person using two identifiers. The patient was located at their home, and I was located at the office of [facility] during the encounter. I discussed the limitations of evaluation and management by telemedicine.  Video was attempted and failed.  The patient expressed understanding and agreed to proceed.      Subjective:  Patient ID: Melanie Spencer, female    DOB: 04/11/1961  Age: 58 y.o. MRN: [id number]  CC:  Chief Complaint  Patient presents with  . Follow-up      HPI  This patient presents for virtual office visit to discuss her hypertension and type 2 diabetes.  Type 2 diabetes: Last A1c was collected in October 2020 and it was 11.2.  She has since then been started on Trulicity injection.  She was last seen in this office on November 24.  At that time I told her to check her blood sugar 4 times a day (fasting, prelunch, predinner, and at bedtime) and to keep a log that she would have available to her for this follow-up appointment.  She tells me she has not done this.  Her husband is available during this visit and pulled up her glucometer to look at most recent blood sugars.  She does check a fasting blood sugar every day for the last 10 days it has ranged between 61 and 125.  She has checked a post meal blood sugar 3 times over the last 10 days and this has ranged between 1 21-2 04.  Hypertension: She takes ramipril 10 mg twice a day for her blood pressure.  At her last office visit we discussed getting an at home blood pressure cuff so that she can monitor her blood pressure at home, because her blood pressure has been uncontrolled in the office but there was a  question of whether or not it is partially uncontrolled because she gets a little bit nervous when coming to the doctor's office.  She has not been able to get a blood pressure cuff.  I did send a prescription of this to her pharmacy, but this has not been filled.  Is not clear as to whether or not the pharmacy has received this prescription.  Past Medical History:  Diagnosis Date  . Cerebrovascular disease 05/23/2019  . Essential hypertension, benign 05/23/2019  . HLD (hyperlipidemia) 05/23/2019  . Morbid obesity (HCC) 05/23/2019  . Stroke (HCC)   . Type II diabetes mellitus, uncontrolled (HCC) 05/23/2019  . Vitamin D deficiency       Family History  Problem Relation Age of Onset  . Heart disease Mother   . Diabetes Mother   . Hypertension Mother   . Cancer Father        Lung  . Hypertension Sister   . Macular degeneration Sister   . Appendicitis Brother   . Hypertension Sister   . 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   Social History   Tobacco Use  . Smoking status: Former Smoker    Packs/day: 2.00    Years: 20.00    Pack years: 40.00  Types: Cigarettes    Quit date: 07/26/2005    Years since quitting: 13.9  . Smokeless tobacco: Never Used  Substance Use Topics  . Alcohol use: No    Alcohol/week: 0.0 standard drinks     No outpatient medications have been marked as taking for the 07/17/19 encounter (Telemedicine) with Ailene Ards, NP.    ROS:  Review of Systems  Constitutional: Negative for fever.  Eyes: Negative for blurred vision.  Respiratory: Negative for cough, shortness of breath and wheezing.   Cardiovascular: Negative for chest pain.  Neurological: Negative for dizziness, sensory change and weakness.  Endo/Heme/Allergies: Negative for polydipsia.     Objective:   Today's Vitals: LMP 03/02/2012  Vitals with BMI 06/19/2019  06/19/2019 05/23/2019  Height - 5' 6.5" 5' 6.5"  Weight - 241 lbs 3 oz 250 lbs  BMI - 70.35 00.93  Systolic 818 299 371  Diastolic 62 72 80  Pulse - 62 72     Physical Exam Comprehensive physical exam not completed today as office visit was conducted remotely.  I did see her on video for a few minutes before video failed.  She did appear well, she answered questions appropriately, she is alert and oriented, and appear to have proper judgment.  Vital signs were not collected today because patient did not have any equipment available to her at time of visit.      Assessment   No diagnosis found.    Tests ordered No orders of the defined types were placed in this encounter.    Plan: Please see assessment and plan per problem list below.   No orders of the defined types were placed in this encounter.   Patient to follow-up in 2 weeks.  The telephone encounter lasted for 20 minutes.  Ailene Ards, NP

## 2019-07-17 NOTE — Assessment & Plan Note (Signed)
I again encouraged her to get a blood pressure cuff.  They will try to get this from the pharmacy.  They do have some financial barriers that make it challenging to afford a blood pressure cuff if insurance does not cover this.  I told him I will call them in approximately 2 days to see if they are able to get this cuff, if they are not we will have her come in for a blood pressure check in the office, and if her blood pressure remains above goal I will change her medications.

## 2019-07-18 NOTE — Addendum Note (Signed)
Addended by: Ailene Ards on: 07/18/2019 09:17 AM   Modules accepted: Orders

## 2019-07-19 ENCOUNTER — Telehealth (INDEPENDENT_AMBULATORY_CARE_PROVIDER_SITE_OTHER): Payer: Self-pay

## 2019-07-19 ENCOUNTER — Other Ambulatory Visit (INDEPENDENT_AMBULATORY_CARE_PROVIDER_SITE_OTHER): Payer: Self-pay | Admitting: Internal Medicine

## 2019-07-19 DIAGNOSIS — I10 Essential (primary) hypertension: Secondary | ICD-10-CM

## 2019-07-19 MED ORDER — BLOOD PRESSURE MONITOR AUTOMAT DEVI: 0 refills | Status: DC

## 2019-07-19 NOTE — Telephone Encounter (Signed)
Melanie Spencer is calling stating that the Blood Pressure Monitoring (BLOOD PRESSURE MONITOR AUTOMAT) DEVICE called in for Melanie Spencer needs to go to [place]

## 2019-07-19 NOTE — Telephone Encounter (Signed)
Let the patient know I have sent the prescription to [facility].

## 2019-07-23 ENCOUNTER — Other Ambulatory Visit (INDEPENDENT_AMBULATORY_CARE_PROVIDER_SITE_OTHER): Payer: Self-pay | Admitting: Internal Medicine

## 2019-07-30 ENCOUNTER — Other Ambulatory Visit (INDEPENDENT_AMBULATORY_CARE_PROVIDER_SITE_OTHER): Payer: Self-pay | Admitting: Internal Medicine

## 2019-08-02 ENCOUNTER — Ambulatory Visit (INDEPENDENT_AMBULATORY_CARE_PROVIDER_SITE_OTHER): Payer: Medicare PPO | Admitting: Nurse Practitioner

## 2019-08-02 ENCOUNTER — Other Ambulatory Visit: Payer: Self-pay

## 2019-08-02 ENCOUNTER — Encounter (INDEPENDENT_AMBULATORY_CARE_PROVIDER_SITE_OTHER): Payer: Self-pay | Admitting: Nurse Practitioner

## 2019-08-02 VITALS — BP 140/88 | HR 76 | Temp 97.3°F | Wt 239.2 lb

## 2019-08-02 DIAGNOSIS — E1165 Type 2 diabetes mellitus with hyperglycemia: Secondary | ICD-10-CM

## 2019-08-02 DIAGNOSIS — I10 Essential (primary) hypertension: Secondary | ICD-10-CM

## 2019-08-02 MED ORDER — AMLODIPINE BESYLATE 2.5 MG PO TABS: 2.5000 mg | ORAL_TABLET | Freq: Every day | ORAL | 3 refills | Status: DC

## 2019-08-02 MED ORDER — BLOOD PRESSURE MONITOR AUTOMAT DEVI: 0 refills | Status: DC

## 2019-08-02 NOTE — Assessment & Plan Note (Signed)
I have called her pharmacy and gave a verbal order for a new glucometer and supplies.  I encouraged the patient to check her blood sugar 4 times a day at home and keep a log of this.  She will continue on her medications for her diabetes as prescribed right now.  She will follow-up in about 1 month at which point we will check her A1c.

## 2019-08-02 NOTE — Assessment & Plan Note (Signed)
Blood pressure is still above goal in the office.  At this point I am going to prescribe her an additional medication for better blood pressure control.  The patient asked me to send prescription for automated blood pressure cuff to a different pharmacy today, thus I will do that as well.  I encouraged her to monitor her blood pressure at home and keep a log of this.  I also told her if she feels unwell for any reason on her new blood pressure medication that she should let us know.  She tells me she understands.

## 2019-08-02 NOTE — Patient Instructions (Signed)
Thank you for choosing [facility] as your medical provider! If you have any questions or concerns regarding your health care, please do not hesitate to call our office.  Start amlodipine daily for Blood pressure. Check your blood pressure at home 2x a week and keep a log. If you feel unwell stop the medication and call this office to notify us.   I will call in prescription for new glucometer. Check your blood sugar 4 times a day and keep a log. Bring to next apppointment.  Take all other prescriptions as prescribed.   Please follow-up as scheduled in 1 month. We look forward to seeing you again soon!   At [facility] we value your feedback. You may receive a survey about your visit today. Please share your experience as we strive to create trusting relationships with our patients to provide genuine, compassionate, quality care.  We appreciate your understanding and patience as we review any laboratory studies, imaging, and other diagnostic tests that are ordered as we care for you. We do our best to address any and all results in a timely manner. If you do not hear about test results within 1 week, please do not hesitate to contact us. If we referred you to a specialist during your visit or ordered imaging testing, contact the office if you have not been contacted to be scheduled within 1 weeks.  We also encourage the use of MyChart, which contains your medical information for your review as well. If you are not enrolled in this feature, an access code is on this after visit summary for your convenience. Thank you for allowing [place] to be involved in your care.

## 2019-08-02 NOTE — Progress Notes (Signed)
Subjective:  Patient ID: Melanie Spencer, female    DOB: 1961/05/19  Age: 59 y.o. MRN: [id number]  CC: No chief complaint on file.     HPI  This patient comes in today for follow-up regarding her hypertension and type 2 diabetes.  Type 2 diabetes: She tells me that her blood sugar has been running between 79 and the mid 100s when they check it.  I have asked her previously to check her blood sugars 4 times a day and keep a log for this. They have not done this thus far. Today, they tell me that the patient's glucometer's batteries are dead and would like a prescription for a new glucometer if possible. She reports no hypoglycemic events since her lantus was reduced to 58 units at bedtime. She continues on her trulicity and metformin as well as her lantus.   Hypertension: She continues on her ramipiril. I have prescribed for her a blood pressure monitor so that she can monitor her blood pressure at home because there was a concern that he blood pressure may only be elevated while in the office. She has not been able to obtain a blood pressure cuff and thus has not been checking her blood pressure at home.  Past Medical History:  Diagnosis Date  . Cerebrovascular disease 05/23/2019  . Essential hypertension, benign 05/23/2019  . HLD (hyperlipidemia) 05/23/2019  . Morbid obesity (HCC) 05/23/2019  . Stroke (HCC)   . Type II diabetes mellitus, uncontrolled (HCC) 05/23/2019  . Vitamin D deficiency       Family History  Problem Relation Age of Onset  . Heart disease Mother   . Diabetes Mother   . Hypertension Mother   . Cancer Father        Lung  . Hypertension Sister   . Macular degeneration Sister   . Appendicitis Brother   . Hypertension Sister   . 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   Social History   Tobacco Use  . Smoking status: Former Smoker   Packs/day: 2.00    Years: 20.00    Pack years: 40.00    Types: Cigarettes    Quit date: 07/26/2005    Years since quitting: 14.0  . Smokeless tobacco: Never Used  Substance Use Topics  . Alcohol use: No    Alcohol/week: 0.0 standard drinks     No outpatient medications have been marked as taking for the 08/02/19 encounter (Office Visit) with Elenore Paddy, NP.    ROS:  Review of Systems  Constitutional: Negative.   Respiratory: Negative.   Cardiovascular: Negative.   Neurological: Positive for sensory change (chronic and unchanged) and weakness (chronic and unchanged).     Objective:   Today's Vitals: BP 140/88 (BP Location: Right Arm, Patient Position: Sitting, Cuff Size: Normal)   Pulse 76   Temp (!) 97.3 F (36.3 C) (Temporal)   Wt 239 lb 3.2 oz (108.5 kg)   LMP 03/02/2012   SpO2 96%   BMI 38.03 kg/m  Vitals with BMI 08/02/2019 06/19/2019 06/19/2019  Height - - 5' 6.5"  Weight 239 lbs 3 oz - 241 lbs 3 oz  BMI - - 38.35  Systolic 140 148 976  Diastolic 88 62 72  Pulse 76 - 62     Physical Exam Vitals reviewed.  Constitutional:      General: She is not  in acute distress.    Appearance: Normal appearance.  HENT:     Head: Normocephalic and atraumatic.  Neck:     Vascular: No carotid bruit.  Cardiovascular:     Rate and Rhythm: Normal rate and regular rhythm.     Pulses: Normal pulses.     Heart sounds: Normal heart sounds.  Pulmonary:     Effort: Pulmonary effort is normal.     Breath sounds: Normal breath sounds.  Skin:    General: Skin is warm and dry.  Neurological:     General: No focal deficit present.     Mental Status: She is alert and oriented to person, place, and time.  Psychiatric:        Mood and Affect: Mood normal.        Behavior: Behavior normal.        Judgment: Judgment normal.          Assessment   1. Essential hypertension, benign       Tests ordered No orders of the defined types were placed in this  encounter.    Plan: Please see assessment and plan per problem list below.   Meds ordered this encounter  Medications  . amLODipine (NORVASC) 2.5 MG tablet    Sig: Take 1 tablet (2.5 mg total) by mouth daily.    Dispense:  90 tablet    Refill:  3    Order Specific Question:   Supervising Provider    Answer:   Hurshel Party C [[id number]]  . Blood Pressure Monitoring (BLOOD PRESSURE MONITOR AUTOMAT) DEVI    Sig: Check blood pressure at home daily    Dispense:  1 each    Refill:  0    Order Specific Question:   Supervising Provider    Answer:   Doree Albee [[id number]]    Patient to follow-up in 1 month.  Ailene Ards, NP

## 2019-08-03 ENCOUNTER — Ambulatory Visit: Payer: Medicare PPO | Admitting: Podiatry

## 2019-08-03 DIAGNOSIS — L97515 Non-pressure chronic ulcer of other part of right foot with muscle involvement without evidence of necrosis: Secondary | ICD-10-CM

## 2019-08-03 DIAGNOSIS — E1149 Type 2 diabetes mellitus with other diabetic neurological complication: Secondary | ICD-10-CM | POA: Diagnosis not present

## 2019-08-03 DIAGNOSIS — E08621 Diabetes mellitus due to underlying condition with foot ulcer: Secondary | ICD-10-CM | POA: Diagnosis not present

## 2019-08-03 DIAGNOSIS — M216X1 Other acquired deformities of right foot: Secondary | ICD-10-CM

## 2019-08-06 ENCOUNTER — Encounter: Payer: Self-pay | Admitting: Podiatry

## 2019-08-06 NOTE — Progress Notes (Signed)
Subjective:  Patient ID: Melanie Spencer, female    DOB: 23-Apr-1961,  MRN: [id number]  Chief Complaint  Patient presents with  . Wound Check    pt is here for a wound check of the right foot, pt states that the right foot is looking a lot better since the last time she was here, pt also shows no signs of infection, pt has also been keeping area nice and dry as well, pt has no other comments or concerns    59 y.o. female presents for wound care.  Patient presents with right submet 5 ulceration.  She states that she has been keeping it nice and dry as well as well bandage.  She is well-known to Dr. Earleen Newport who has been managing the wound.  She would like to make sure that the wound is currently not infected.  She denies any other acute complaints.  She denies any other clinical signs of infection.  She has been wearing her surgical shoe at all times.   Review of Systems: Negative except as noted in the HPI. Denies N/V/F/Ch.  Past Medical History:  Diagnosis Date  . Cerebrovascular disease 05/23/2019  . Essential hypertension, benign 05/23/2019  . HLD (hyperlipidemia) 05/23/2019  . Morbid obesity ([place]) 05/23/2019  . Stroke ([place])   . Type II diabetes mellitus, uncontrolled ([place]) 05/23/2019  . Vitamin D deficiency     Current Outpatient Medications:  .  amLODipine (NORVASC) 2.5 MG tablet, Take 1 tablet (2.5 mg total) by mouth daily., Disp: 90 tablet, Rfl: 3 .  aspirin 325 MG tablet, Take 325 mg by mouth daily., Disp: , Rfl:  .  Blood Pressure Monitoring (BLOOD PRESSURE MONITOR AUTOMAT) DEVI, Check blood pressure at home daily, Disp: 1 each, Rfl: 0 .  cholecalciferol (VITAMIN D) 1000 UNITS tablet, Take 5,000 Units by mouth daily. , Disp: , Rfl:  .  Dulaglutide (TRULICITY) 9.51 OA/4.1YS SOPN, Inject 0.75 mg into the skin once a week., Disp: 4 pen, Rfl: 3 .  insulin glargine (LANTUS) 100 UNIT/ML injection, Inject 58 Units into the skin at bedtime., Disp: , Rfl:  .  LANTUS SOLOSTAR 100 UNIT/ML  Solostar Pen, ADMINISTER 75 UNITS UNDER THE SKIN AT BEDTIME, Disp: 15 mL, Rfl: 1 .  omeprazole (PRILOSEC) 20 MG capsule, TAKE 1 TABLET BY MOUTH DAILY, Disp: 30 capsule, Rfl: 2 .  oxybutynin (DITROPAN) 5 MG tablet, TAKE 1 TABLET BY MOUTH TWICE DAILY, Disp: 180 tablet, Rfl: 0 .  ramipril (ALTACE) 10 MG capsule, Take 2 capsules (20 mg total) by mouth daily., Disp: 180 capsule, Rfl: 0 .  simvastatin (ZOCOR) 40 MG tablet, TAKE 1 TABLET BY MOUTH AT BEDTIME, Disp: 90 tablet, Rfl: 0 .  fluticasone (FLONASE) 50 MCG/ACT nasal spray, SHAKE LIQUID AND USE 2 SPRAYS IN EACH NOSTRIL EVERY DAY, Disp: 16 g, Rfl: 3 .  gabapentin (NEURONTIN) 300 MG capsule, TAKE 2 CAPSULES BY MOUTH THREE TIMES DAILY, Disp: 180 capsule, Rfl: 3 .  metFORMIN (GLUCOPHAGE) 1000 MG tablet, TAKE 1 TABLET BY MOUTH TWICE DAILY, Disp: 60 tablet, Rfl: 3  Social History   Tobacco Use  Smoking Status Former Smoker  . Packs/day: 2.00  . Years: 20.00  . Pack years: 40.00  . Types: Cigarettes  . Quit date: 07/26/2005  . Years since quitting: 14.0  Smokeless Tobacco Never Used    No Known Allergies Objective:  There were no vitals filed for this visit. There is no height or weight on file to calculate BMI. Constitutional Well developed. Well nourished.  Vascular Dorsalis pedis pulses palpable bilaterally. Posterior tibial pulses palpable bilaterally. Capillary refill normal to all digits.  No cyanosis or clubbing noted. Pedal hair growth normal.  Neurologic Normal speech. Oriented to person, place, and time. Protective sensation absent  Dermatologic Wound Location: Right submetatarsal 5 wound Wound Base: Granular/Healthy Peri-wound: Calloused Exudate: None: wound tissue dry Wound Measurements: -See below  Orthopedic: No pain to palpation either foot.   Radiographs: None Assessment:   1. Type II diabetes mellitus with neurological manifestations (HCC)   2. Prominent metatarsal head of right foot   3. 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  Ulcer right submetatarsal 5 wound-it appears that the central depth is increasing -Debridement as below. -Dressed with triple antibiotic and a Band-Aid, DSD. -Continue off-loading with surgical shoe.  Procedure: Excisional Debridement of Wound Rationale: Removal of non-viable soft tissue from the wound to promote healing.  Anesthesia: none Pre-Debridement Wound Measurements: 1.2 cm x 1.0 cm x 0.4 cm  Post-Debridement Wound Measurements: 1 cm x 1 cm x 0.4 cm  Type of Debridement: Sharp Excisional Tissue Removed: Non-viable soft tissue Depth of Debridement: subcutaneous tissue. Technique: Sharp excisional debridement to bleeding, viable wound base.  Dressing: Dry, sterile, compression dressing. Disposition: Patient tolerated procedure well. Patient to return in 1 week for follow-up.  Return in about 2 weeks (around 08/17/2019), or f/u with Dr. Ardelle Anton.

## 2019-08-07 ENCOUNTER — Telehealth: Payer: Self-pay | Admitting: Podiatry

## 2019-08-07 NOTE — Telephone Encounter (Signed)
Called pt unable to leave voice mail will call again to r/s with Dr.patel change from [facility]

## 2019-08-16 ENCOUNTER — Other Ambulatory Visit (INDEPENDENT_AMBULATORY_CARE_PROVIDER_SITE_OTHER): Payer: Self-pay | Admitting: Internal Medicine

## 2019-08-17 ENCOUNTER — Telehealth: Payer: Self-pay | Admitting: Podiatry

## 2019-08-17 NOTE — Telephone Encounter (Signed)
Tried calling pt again unable to leave vm due to voicemail box full. Was trying to pt on Dr.Patels schedule Since Dr.Wagoner wanted Melanie Spencer to follow up with this pt. has appt is 08/20/19 w/Wagoner  Still unable to reach pt

## 2019-08-21 ENCOUNTER — Encounter: Payer: Self-pay | Admitting: Podiatry

## 2019-08-21 ENCOUNTER — Ambulatory Visit: Payer: Medicare PPO | Admitting: Podiatry

## 2019-08-21 ENCOUNTER — Other Ambulatory Visit: Payer: Self-pay

## 2019-08-21 DIAGNOSIS — E1149 Type 2 diabetes mellitus with other diabetic neurological complication: Secondary | ICD-10-CM | POA: Diagnosis not present

## 2019-08-21 DIAGNOSIS — M216X1 Other acquired deformities of right foot: Secondary | ICD-10-CM | POA: Diagnosis not present

## 2019-08-21 DIAGNOSIS — L97515 Non-pressure chronic ulcer of other part of right foot with muscle involvement without evidence of necrosis: Secondary | ICD-10-CM

## 2019-08-21 DIAGNOSIS — E08621 Diabetes mellitus due to underlying condition with foot ulcer: Secondary | ICD-10-CM

## 2019-08-28 NOTE — Progress Notes (Signed)
Subjective: 59 year old female presents the office today for follow-up evaluation of a wound on the ball of her right foot.  Possibly she has a follow-up with Dr. Allena Katz.  The wound appeared to be larger at that time. She has continued with Prisma dressing changes daily. No drainage or pus. No swelling or redness. Mild pain at times. Denies any fevers, chills, nausea, vomiting.  No calf pain, chest pain, shortness of breath.  No other concerns today.   Objective: AAO x3, NAD DP/PT pulses palpable bilaterally, CRT less than 3 seconds Ulceration continues ongoing right foot submetatarsal 5.  Granular wound base is present with small hyperkeratotic periwound.  Minimal hyperkeratotic periwound and overall the wound base is granular.  The wound slightly larger today measuring 1.3 x 0.1 x 0.3 cm.The more proximal aspect of the wound is flush with the skin and the distal half of the wound has the depth.  There is no probing to bone, undermining or tunneling.  No significant surrounding erythema, ascending cellulitis.  No fluctuation crepitation.  Hyperkeratotic periwound.  There is no fluctuation crepitation.  No malodor. No pain with calf compression, swelling, warmth, erythema    Assessment: Right foot ulceration; dropfoot left side  Plan: -All treatment options discussed with the patient including all alternatives, risks, complications.  -I debrided the wound #312 with scalpel that any complications or bleeding.  Will prescribe to a Mepilex with silver dressing.  This was ordered today through [facility].  I did dispense a few days today as well. Hopefully this will better help with wound healing but also for the protection. Discussed surgery to remove the 5th metatarsal head but they want to hold off on any surgery. Also has declined a wound care referral.  -Monitor for any clinical signs or symptoms of infection and directed to call the office immediately should any occur or go to the ER.  Return in about 2  weeks (around 09/04/2019) for wound check .  Vivi Barrack DPM

## 2019-08-29 ENCOUNTER — Ambulatory Visit (INDEPENDENT_AMBULATORY_CARE_PROVIDER_SITE_OTHER): Payer: Medicare PPO | Admitting: Nurse Practitioner

## 2019-08-29 ENCOUNTER — Other Ambulatory Visit: Payer: Self-pay

## 2019-08-29 VITALS — BP 140/86 | HR 87 | Temp 96.9°F | Resp 18 | Ht 66.0 in | Wt 240.0 lb

## 2019-08-29 DIAGNOSIS — R32 Unspecified urinary incontinence: Secondary | ICD-10-CM | POA: Insufficient documentation

## 2019-08-29 DIAGNOSIS — N3941 Urge incontinence: Secondary | ICD-10-CM

## 2019-08-29 DIAGNOSIS — E1165 Type 2 diabetes mellitus with hyperglycemia: Secondary | ICD-10-CM

## 2019-08-29 DIAGNOSIS — I10 Essential (primary) hypertension: Secondary | ICD-10-CM | POA: Diagnosis not present

## 2019-08-29 MED ORDER — OXYBUTYNIN CHLORIDE 5 MG PO TABS: 5.0000 mg | ORAL_TABLET | Freq: Three times a day (TID) | ORAL | 0 refills | Status: DC

## 2019-08-29 NOTE — Progress Notes (Signed)
Subjective:  Patient ID: Melanie Spencer, female    DOB: 1961-04-02  Age: 59 y.o. MRN: [id number]  CC:  Chief Complaint  Patient presents with  . Follow-up  . Medication Reaction    new bp pil; kept her in bathroom.      HPI  This patient arrives accompanied by her husband for the above.  Type 2 diabetes: Last A1c was collected in October 2020.  It was 11.2 at that time.  We have been adjusting her medications and recently reduced her Lantus from 60 units at bedtime down to 58 because her blood sugars were actually low in the morning.  Since reducing her insulin by 2 units she reports that her blood sugars have been elevated.  I have asked her to check her blood sugar 4 times a day repeatedly, but she has not done this.  I would like this data to determine if we should start some short acting insulin to cover her meals.  She does have 14 days worth of fasting blood sugars in her glucometer today.  These have ranged from 1 41-2 37.  She denies any hypoglycemic events.  Last time her urine was checked for albumin microalbuminuria was noted.  This was approximately 1 year ago.  She also continues on Trulicity and Metformin.  Hypertension: Blood pressure has been elevated and at her last office visit we started her on amlodipine 2.5 mg.  Today she tells me she could not tolerate the side effects and has stopped this medication on her own.  She continues on her ramipril.   Past Medical History:  Diagnosis Date  . Cerebrovascular disease 05/23/2019  . Essential hypertension, benign 05/23/2019  . HLD (hyperlipidemia) 05/23/2019  . Morbid obesity (HCC) 05/23/2019  . Stroke (HCC)   . Type II diabetes mellitus, uncontrolled (HCC) 05/23/2019  . Vitamin D deficiency       Family History  Problem Relation Age of Onset  . Heart disease Mother   . Diabetes Mother   . Hypertension Mother   . Cancer Father        Lung  . Hypertension Sister   . Macular degeneration Sister   .  Appendicitis Brother   . Hypertension Sister   . 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   Social History   Tobacco Use  . Smoking status: Former Smoker    Packs/day: 2.00    Years: 20.00    Pack years: 40.00    Types: Cigarettes    Quit date: 07/26/2005    Years since quitting: 14.1  . Smokeless tobacco: Never Used  Substance Use Topics  . Alcohol use: No    Alcohol/week: 0.0 standard drinks     Current Meds  Medication Sig  . amLODipine (NORVASC) 2.5 MG tablet Take 1 tablet (2.5 mg total) by mouth daily.  Marland Kitchen aspirin 325 MG tablet Take 325 mg by mouth daily.  . Blood Pressure Monitoring (BLOOD PRESSURE MONITOR AUTOMAT) DEVI Check blood pressure at home daily  . cholecalciferol (VITAMIN D) 1000 UNITS tablet Take 5,000 Units by mouth daily.   . Dulaglutide (TRULICITY) 0.75 MG/0.5ML SOPN Inject 0.75 mg into the skin once a week.  . fluticasone (FLONASE) 50 MCG/ACT nasal spray SHAKE LIQUID AND USE 2 SPRAYS IN EACH NOSTRIL EVERY DAY  . gabapentin (NEURONTIN) 300 MG capsule TAKE 2 CAPSULES BY MOUTH THREE  TIMES DAILY  . insulin glargine (LANTUS) 100 UNIT/ML injection Inject 58 Units into the skin at bedtime.  Marland Kitchen LANTUS SOLOSTAR 100 UNIT/ML Solostar Pen ADMINISTER 75 UNITS UNDER THE SKIN AT BEDTIME  . metFORMIN (GLUCOPHAGE) 1000 MG tablet TAKE 1 TABLET BY MOUTH TWICE DAILY  . omeprazole (PRILOSEC) 20 MG capsule TAKE 1 TABLET BY MOUTH DAILY  . oxybutynin (DITROPAN) 5 MG tablet TAKE 1 TABLET BY MOUTH TWICE DAILY  . ramipril (ALTACE) 10 MG capsule Take 2 capsules (20 mg total) by mouth daily.  . simvastatin (ZOCOR) 40 MG tablet TAKE 1 TABLET BY MOUTH AT BEDTIME    ROS:  Review of Systems  Constitutional: Positive for malaise/fatigue. Negative for fever.  Eyes: Negative for blurred vision.  Respiratory: Negative for cough, shortness of breath and wheezing.     Cardiovascular: Negative for chest pain and palpitations.  Endo/Heme/Allergies: Negative for polydipsia (-) polyphagia, (+) polyuria.     Objective:   Today's Vitals: BP 140/86 (BP Location: Right Arm, Patient Position: Sitting, Cuff Size: Normal)   Pulse 87   Temp (!) 96.9 F (36.1 C) (Temporal)   Resp 18   Ht 5\' 6"  (1.676 m)   Wt 240 lb (108.9 kg)   LMP 03/02/2012   SpO2 97% Comment: wearing mask.  BMI 38.74 kg/m  Vitals with BMI 08/29/2019 08/02/2019 06/19/2019  Height 5\' 6"  - -  Weight 240 lbs 239 lbs 3 oz -  BMI 68.34 - -  Systolic 196 222 979  Diastolic 86 88 62  Pulse 87 76 -     Physical Exam Vitals reviewed.  Constitutional:      General: She is not in acute distress.    Appearance: Normal appearance.  HENT:     Head: Normocephalic and atraumatic.  Neck:     Vascular: No carotid bruit.  Cardiovascular:     Rate and Rhythm: Normal rate and regular rhythm.     Pulses: Normal pulses.     Heart sounds: Normal heart sounds.  Pulmonary:     Effort: Pulmonary effort is normal.     Breath sounds: Normal breath sounds.  Skin:    General: Skin is warm and dry.  Neurological:     General: No focal deficit present.     Mental Status: She is alert and oriented to person, place, and time.  Psychiatric:        Mood and Affect: Mood normal.        Behavior: Behavior normal.        Judgment: Judgment normal.          Assessment   No diagnosis found.    Tests ordered No orders of the defined types were placed in this encounter.    Plan: Please see assessment and plan per problem list below.   No orders of the defined types were placed in this encounter.   Patient to follow-up in 3 months  Ailene Ards, NP

## 2019-08-29 NOTE — Patient Instructions (Signed)
Thank you for choosing [facility] as your medical provider! If you have any questions or concerns regarding your health care, please do not hesitate to call our office.  Increase your Lantus from 58 units at night to 60 units at nights.  Further recommendations regarding medication adjustments will be made based upon your A1c.  Please try to remember to check your blood sugar 4 times a day.  Once before each meal and before bedtime.  Please keep a log of these blood sugars for at least 2 weeks prior to her next upcoming appointment.  Please schedule an appointment with your eye doctor for your annual diabetic eye exam.  I have increased your oxybutynin dose from 5 mg twice a day to 3 times a day.  I will also send referral to physical therapy for additional assistance with strengthening her pelvic floor muscles to help with your urinary incontinence.  If these changes do not help we should refer to urology for further recommendations and managing your incontinence.  Continue all other medications as prescribed  Please follow-up as scheduled in 3 months. We look forward to seeing you again soon!   At [facility] we value your feedback. You may receive a survey about your visit today. Please share your experience as we strive to create trusting relationships with our patients to provide genuine, compassionate, quality care.  We appreciate your understanding and patience as we review any laboratory studies, imaging, and other diagnostic tests that are ordered as we care for you. We do our best to address any and all results in a timely manner. If you do not hear about test results within 1 week, please do not hesitate to contact us. If we referred you to a specialist during your visit or ordered imaging testing, contact the office if you have not been contacted to be scheduled within 1 weeks.  We also encourage the use of MyChart, which contains your medical information for your  review as well. If you are not enrolled in this feature, an access code is on this after visit summary for your convenience. Thank you for allowing [place] to be involved in your care.

## 2019-08-29 NOTE — Assessment & Plan Note (Signed)
She will continue on her ramipril as prescribed.  We again discussed the importance of trying to lose some weight which will also help with blood pressure control.  She is going to try to make changes to her diet.

## 2019-08-29 NOTE — Assessment & Plan Note (Signed)
She does mention to me that she continues to have urinary incontinence and is wondering if increasing the dose of her oxybutynin may help this.  I have directed her to take her oxybutynin 3 times a day, and we did discuss further treatment options.  She would like to be referred to physical therapy for pelvic floor muscle training.  If she continues to have difficulty controlling her urinary incontinence may consider referral to urology for further evaluation and management in the future.

## 2019-08-29 NOTE — Assessment & Plan Note (Addendum)
I have told her to increase her Lantus back to 60 units at bedtime.  I again reemphasized the importance of checking her blood sugar 4 times a day so that I can determine how her blood sugars are running during the day so that we can get better control of blood sugars.  I again reemphasized that controlling her diabetes can help reduce her risk of organ damage.  She tells me she understands.  I have also reemphasized the importance of following up with an eye doctor for further evaluation.  I again also discussed that she needs to be an advocate and a partner with me regarding her health care.  I did let her know that there have been multiple recommendations made by myself to help gain better control of her diabetes that she has not followed through with.  I told her that I I hope she will start following my recommendations.  She tells me she will try.

## 2019-08-30 ENCOUNTER — Encounter (INDEPENDENT_AMBULATORY_CARE_PROVIDER_SITE_OTHER): Payer: Self-pay | Admitting: Nurse Practitioner

## 2019-08-30 ENCOUNTER — Other Ambulatory Visit (INDEPENDENT_AMBULATORY_CARE_PROVIDER_SITE_OTHER): Payer: Self-pay | Admitting: Nurse Practitioner

## 2019-08-30 DIAGNOSIS — E1165 Type 2 diabetes mellitus with hyperglycemia: Secondary | ICD-10-CM | POA: Diagnosis not present

## 2019-08-31 ENCOUNTER — Other Ambulatory Visit (INDEPENDENT_AMBULATORY_CARE_PROVIDER_SITE_OTHER): Payer: Self-pay | Admitting: Nurse Practitioner

## 2019-08-31 DIAGNOSIS — N3941 Urge incontinence: Secondary | ICD-10-CM

## 2019-08-31 LAB — HEMOGLOBIN A1C
Hgb A1c MFr Bld: 9.4 % of total Hgb — ABNORMAL HIGH (ref <5.7)
Mean Plasma Glucose: 223 (calc)
eAG (mmol/L): 12.4 (calc)

## 2019-08-31 LAB — MICROALBUMIN / CREATININE URINE RATIO
Creatinine, Urine: 88 mg/dL (ref 20–275)
Microalb Creat Ratio: 169 mcg/mg creat — ABNORMAL HIGH (ref <30)
Microalb, Ur: 14.9 mg/dL

## 2019-09-03 ENCOUNTER — Telehealth (INDEPENDENT_AMBULATORY_CARE_PROVIDER_SITE_OTHER): Payer: Self-pay

## 2019-09-03 NOTE — Telephone Encounter (Signed)
Thank you for letting me know. I don't think we require additional medication changes. I will discuss this with the patient at her next office visit.

## 2019-09-03 NOTE — Telephone Encounter (Signed)
urine test resutls. The Alb/Creatinine is 169 H & was faxed to the office.

## 2019-09-06 ENCOUNTER — Ambulatory Visit: Payer: Medicare PPO | Admitting: Podiatry

## 2019-09-12 ENCOUNTER — Other Ambulatory Visit (INDEPENDENT_AMBULATORY_CARE_PROVIDER_SITE_OTHER): Payer: Self-pay | Admitting: Internal Medicine

## 2019-09-18 ENCOUNTER — Ambulatory Visit: Payer: Medicare PPO | Admitting: Podiatry

## 2019-09-24 ENCOUNTER — Telehealth: Payer: Self-pay | Admitting: *Deleted

## 2019-09-24 NOTE — Telephone Encounter (Signed)
Unable to leave a message voicemail is not set up

## 2019-09-24 NOTE — Telephone Encounter (Signed)
Pt called for pain medication. 

## 2019-09-25 NOTE — Telephone Encounter (Signed)
I tried to call the patient to see how she was doing prior to sending the medication. No answer and unable to leave VM. Will attempt tomorrow.

## 2019-09-28 NOTE — Telephone Encounter (Signed)
Unable to leave a message the mailbox is full. °

## 2019-09-28 NOTE — Telephone Encounter (Signed)
Val- I had left a message this week about the pain medication. I wanted to make sure there was no worsening of the wound and no signs of infection. Can you call her to see how she is doing and how often she is taking the pain medication? Thanks.

## 2019-10-01 ENCOUNTER — Telehealth: Payer: Self-pay | Admitting: Podiatry

## 2019-10-01 NOTE — Telephone Encounter (Signed)
Pt husband called and wanted to know if there was any of the padding that Dr.Wagoner had been giving pt wanted to know if he could get more. Please advise

## 2019-10-01 NOTE — Telephone Encounter (Signed)
Yes, they can come by to get some. I would also like to see her.   I tried to call but no answer and voicemail is full. I have also tried calling about the pain medication. Unable to reach her.

## 2019-10-02 NOTE — Telephone Encounter (Signed)
Pt's husband, Melanie Spencer states pt's wound is healing, the rim around it is looser and it is bleeding and she said that meant it was getting enough blood and he would be by to pick up the pads later.

## 2019-10-02 NOTE — Telephone Encounter (Signed)
Left message informing pt's husband, Jolene Provost he could come to the office for the requested padding and we would like to know how pt is doing.

## 2019-10-02 NOTE — Telephone Encounter (Signed)
When the come in can you ask about the pain medication??

## 2019-10-02 NOTE — Telephone Encounter (Signed)
Pt's husband came to office for pads and I asked pt's pain status. Melanie Spencer states pt hs some pain on and off, mostly in the little toe. I gave Melanie Spencer felt pads to off load the 5th MPJ. Melanie Spencer states Dr. Ardelle Anton was to give her a medicated pad. Dr. Ardelle Anton requested Meriplex AG pads from [facility].

## 2019-10-03 ENCOUNTER — Other Ambulatory Visit: Payer: Self-pay | Admitting: Podiatry

## 2019-10-03 ENCOUNTER — Telehealth: Payer: Self-pay

## 2019-10-03 MED ORDER — TRAMADOL HCL 50 MG PO TABS: 50.0000 mg | ORAL_TABLET | Freq: Two times a day (BID) | ORAL | 0 refills | Status: AC | PRN

## 2019-10-03 NOTE — Telephone Encounter (Signed)
Dr. Ardelle Anton ordered Meriplex AG for daily dressing changes to right foot 5th MPJ. Faxed orders with clinicals to [facility].

## 2019-10-03 NOTE — Telephone Encounter (Signed)
Thank you. I also sent over tarmadol to her pharmacy.

## 2019-10-03 NOTE — Telephone Encounter (Signed)
Pt called to confirm appointment for tomorrow with Dr.Wagoner.

## 2019-10-04 ENCOUNTER — Other Ambulatory Visit: Payer: Self-pay

## 2019-10-04 ENCOUNTER — Ambulatory Visit: Payer: Medicare PPO | Admitting: Podiatry

## 2019-10-04 DIAGNOSIS — E1149 Type 2 diabetes mellitus with other diabetic neurological complication: Secondary | ICD-10-CM

## 2019-10-04 DIAGNOSIS — E08621 Diabetes mellitus due to underlying condition with foot ulcer: Secondary | ICD-10-CM | POA: Diagnosis not present

## 2019-10-04 DIAGNOSIS — L97515 Non-pressure chronic ulcer of other part of right foot with muscle involvement without evidence of necrosis: Secondary | ICD-10-CM

## 2019-10-04 DIAGNOSIS — M216X1 Other acquired deformities of right foot: Secondary | ICD-10-CM

## 2019-10-04 DIAGNOSIS — R269 Unspecified abnormalities of gait and mobility: Secondary | ICD-10-CM

## 2019-10-10 ENCOUNTER — Other Ambulatory Visit (INDEPENDENT_AMBULATORY_CARE_PROVIDER_SITE_OTHER): Payer: Self-pay | Admitting: Nurse Practitioner

## 2019-10-14 NOTE — Progress Notes (Signed)
Subjective: 59 year old female presents the office today for follow-up evaluation of a wound on the ball of her right foot.  She has had to miss the last couple of appointments due to other issues.  Her husband states the wound to be somewhat bigger in diameter but seems to be filling in.  Is occasional discomfort.  No drainage or pus.  Only some bloody drainage at times.  There is no increase in swelling or redness of the foot they report.  Denies any fevers, chills, nausea, vomiting.  No calf pain, chest pain, shortness of breath.   Objective: AAO x3, NAD DP/PT pulses palpable bilaterally, CRT less than 3 seconds Ulceration continues ongoing right foot submetatarsal 5 and today appears to be larger 1.5 x 1 x 0.2 cm.  The wound base was fibrotic, granular and there was hair/sock fuzz in the wound. There is no probing, undermining or tunneling.  There is no surrounding erythema, ascending cellulitis.  There is no fluctuation crepitation any malodor.  Gait changes are present likely resulting in the nonhealing of the wound. No pain with calf compression, swelling, warmth, erythema      Assessment: Right foot ulceration; dropfoot left side  Plan: -All treatment options discussed with the patient including all alternatives, risks, complications.  -Sharply debrided the wound to utilize #312 with scalpel to any complications or bleeding.  I previously ordered Mepilex with silver dressing but he never received this.  This was reordered today.  For now continue a small amount of Silvadene to the area daily. -Offloading at all times. -Monitor for any clinical signs or symptoms of infection and directed to call the office immediately should any occur or go to the ER.  Vivi Barrack DPM

## 2019-10-23 ENCOUNTER — Ambulatory Visit: Payer: Medicare PPO | Admitting: Podiatry

## 2019-10-23 ENCOUNTER — Other Ambulatory Visit: Payer: Self-pay

## 2019-10-23 DIAGNOSIS — L97512 Non-pressure chronic ulcer of other part of right foot with fat layer exposed: Secondary | ICD-10-CM | POA: Diagnosis not present

## 2019-10-23 DIAGNOSIS — E1149 Type 2 diabetes mellitus with other diabetic neurological complication: Secondary | ICD-10-CM

## 2019-10-26 ENCOUNTER — Other Ambulatory Visit (INDEPENDENT_AMBULATORY_CARE_PROVIDER_SITE_OTHER): Payer: Self-pay | Admitting: Internal Medicine

## 2019-10-29 ENCOUNTER — Telehealth: Payer: Self-pay | Admitting: Podiatry

## 2019-10-29 NOTE — Telephone Encounter (Signed)
Patient's husband called and stated that the wound area is hurting more and that when patient was in the office last week, Dr Ardelle Anton trimmed on it and patient's husband wanted to know what could he use or try and I stated that I would ask Dr Ardelle Anton and get back to the patient. Misty Stanley

## 2019-10-29 NOTE — Telephone Encounter (Signed)
Called the patient's husband back and relayed the message per Dr Ardelle Anton and left a message on the voice mail. Misty Stanley

## 2019-10-29 NOTE — Progress Notes (Signed)
Subjective: 59 year old female presents the office today for follow-up evaluation of a wound on the ball of her right foot.  Overall her husband thinks that she is doing better.  Although the wound is still at the same size he feels it is filled in.  Denies any drainage or pus or any swelling or redness around the wound or to the foot.  She does some occasional discomfort at times.  There is no drainage or pus coming from the area.  No other concerns today.  Denies any fevers, chills, nausea, vomiting.  No calf pain, chest pain, shortness of breath.   Objective: AAO x3, NAD DP/PT pulses palpable bilaterally, CRT less than 3 seconds Ulceration continues ongoing right foot submetatarsal 5 and today which is about the same size in diameter 1.4 x 1 cm however it is more superficial about even with the skin.  Granular wound base with hyperkeratotic periwound.  There is no surrounding erythema, ascending cellulitis.  No fluctuation crepitation.  There is no malodor. No pain with calf compression, swelling, warmth, erythema   Assessment: Right foot ulceration; dropfoot left side  Plan: -All treatment options discussed with the patient including all alternatives, risks, complications.  -Sharply debrided the wound to utilize #312 with scalpel to any complications or bleeding down to healthy, viable tissue.  Continue daily dressing changes.  He has been using medihoney daily.  Continue to change dressings daily but wash with soap and water daily dry thoroughly before the bandage.  Continue offloading at all times.  Melanie Spencer DPM

## 2019-10-29 NOTE — Telephone Encounter (Signed)
Do they have any more of the mepilex pads? I know we had tried ordering them before but don't think they ever received them. If not we can try to reorder those. Please make sure there is no surrounding redness, drainage, red streaks or any other signs of infection. Thanks.

## 2019-10-29 NOTE — Telephone Encounter (Signed)
My wife was seen last week and her wound is hurting her a little more. We were told to call Lorna if that was to happen as they may have to change the course of treatment/medications. If Kaidynce could please call me back at [phone].

## 2019-10-30 ENCOUNTER — Other Ambulatory Visit (INDEPENDENT_AMBULATORY_CARE_PROVIDER_SITE_OTHER): Payer: Self-pay | Admitting: Nurse Practitioner

## 2019-10-30 ENCOUNTER — Telehealth: Payer: Self-pay | Admitting: *Deleted

## 2019-10-30 NOTE — Telephone Encounter (Signed)
Melanie Spencer w/ [facility] is calling regarding an wound care supply order form sent. There are changes that need to be made to the order which are the drainage should be moderate for at home,depth measurements of the wound be 0.1 on the actual physical order and there should be full thickness to the physicians note. Please correct and refax to [phone].Any questions, please call at [phone].

## 2019-10-31 NOTE — Telephone Encounter (Signed)
Made the necessary changes to the requisition form and faxed back over today at [facility] [phone]. Misty Stanley

## 2019-11-06 ENCOUNTER — Other Ambulatory Visit (INDEPENDENT_AMBULATORY_CARE_PROVIDER_SITE_OTHER): Payer: Self-pay

## 2019-11-06 ENCOUNTER — Other Ambulatory Visit: Payer: Self-pay | Admitting: Podiatry

## 2019-11-06 MED ORDER — DOXYCYCLINE HYCLATE 100 MG PO TABS: 100.0000 mg | ORAL_TABLET | Freq: Two times a day (BID) | ORAL | 0 refills | Status: DC

## 2019-11-06 MED ORDER — LANTUS SOLOSTAR 100 UNIT/ML ~~LOC~~ SOPN: PEN_INJECTOR | SUBCUTANEOUS | 3 refills | Status: DC

## 2019-11-06 NOTE — Progress Notes (Signed)
Patient called and stated there was some mild drainage apparently. No pus or redness. Will start doxycycline. I am going to open a place of them to come in on Thursday. If any worsening signs or symptoms of infection to go to the ER.

## 2019-11-08 ENCOUNTER — Inpatient Hospital Stay (HOSPITAL_COMMUNITY)
Admission: EM | Admit: 2019-11-08 | Discharge: 2019-11-10 | DRG: 617 | Disposition: A | Discharge status: Home Health | Payer: Medicare PPO | Source: Ambulatory Visit | Attending: Internal Medicine | Admitting: Internal Medicine

## 2019-11-08 ENCOUNTER — Ambulatory Visit (INDEPENDENT_AMBULATORY_CARE_PROVIDER_SITE_OTHER): Payer: Medicare PPO

## 2019-11-08 ENCOUNTER — Encounter (HOSPITAL_COMMUNITY): Payer: Self-pay

## 2019-11-08 ENCOUNTER — Ambulatory Visit: Payer: Medicare PPO | Admitting: Podiatry

## 2019-11-08 ENCOUNTER — Other Ambulatory Visit: Payer: Self-pay

## 2019-11-08 DIAGNOSIS — K219 Gastro-esophageal reflux disease without esophagitis: Secondary | ICD-10-CM | POA: Diagnosis present

## 2019-11-08 DIAGNOSIS — I10 Essential (primary) hypertension: Secondary | ICD-10-CM | POA: Diagnosis present

## 2019-11-08 DIAGNOSIS — Z809 Family history of malignant neoplasm, unspecified: Secondary | ICD-10-CM | POA: Diagnosis not present

## 2019-11-08 DIAGNOSIS — E1149 Type 2 diabetes mellitus with other diabetic neurological complication: Secondary | ICD-10-CM

## 2019-11-08 DIAGNOSIS — E1169 Type 2 diabetes mellitus with other specified complication: Secondary | ICD-10-CM | POA: Diagnosis present

## 2019-11-08 DIAGNOSIS — M86171 Other acute osteomyelitis, right ankle and foot: Secondary | ICD-10-CM | POA: Diagnosis present

## 2019-11-08 DIAGNOSIS — E1142 Type 2 diabetes mellitus with diabetic polyneuropathy: Secondary | ICD-10-CM | POA: Diagnosis not present

## 2019-11-08 DIAGNOSIS — M869 Osteomyelitis, unspecified: Secondary | ICD-10-CM | POA: Diagnosis present

## 2019-11-08 DIAGNOSIS — E782 Mixed hyperlipidemia: Secondary | ICD-10-CM | POA: Diagnosis present

## 2019-11-08 DIAGNOSIS — E669 Obesity, unspecified: Secondary | ICD-10-CM | POA: Diagnosis present

## 2019-11-08 DIAGNOSIS — D509 Iron deficiency anemia, unspecified: Secondary | ICD-10-CM | POA: Diagnosis present

## 2019-11-08 DIAGNOSIS — Z794 Long term (current) use of insulin: Secondary | ICD-10-CM

## 2019-11-08 DIAGNOSIS — Z8249 Family history of ischemic heart disease and other diseases of the circulatory system: Secondary | ICD-10-CM | POA: Diagnosis not present

## 2019-11-08 DIAGNOSIS — E1165 Type 2 diabetes mellitus with hyperglycemia: Secondary | ICD-10-CM | POA: Diagnosis present

## 2019-11-08 DIAGNOSIS — E559 Vitamin D deficiency, unspecified: Secondary | ICD-10-CM | POA: Diagnosis present

## 2019-11-08 DIAGNOSIS — S98911A Complete traumatic amputation of right foot, level unspecified, initial encounter: Secondary | ICD-10-CM | POA: Diagnosis not present

## 2019-11-08 DIAGNOSIS — Z8673 Personal history of transient ischemic attack (TIA), and cerebral infarction without residual deficits: Secondary | ICD-10-CM

## 2019-11-08 DIAGNOSIS — L97512 Non-pressure chronic ulcer of other part of right foot with fat layer exposed: Secondary | ICD-10-CM | POA: Diagnosis not present

## 2019-11-08 DIAGNOSIS — Z79899 Other long term (current) drug therapy: Secondary | ICD-10-CM

## 2019-11-08 DIAGNOSIS — IMO0002 Reserved for concepts with insufficient information to code with codable children: Secondary | ICD-10-CM

## 2019-11-08 DIAGNOSIS — Z03818 Encounter for observation for suspected exposure to other biological agents ruled out: Secondary | ICD-10-CM | POA: Diagnosis not present

## 2019-11-08 DIAGNOSIS — Z6838 Body mass index (BMI) 38.0-38.9, adult: Secondary | ICD-10-CM | POA: Diagnosis not present

## 2019-11-08 DIAGNOSIS — Z7982 Long term (current) use of aspirin: Secondary | ICD-10-CM

## 2019-11-08 DIAGNOSIS — Z09 Encounter for follow-up examination after completed treatment for conditions other than malignant neoplasm: Secondary | ICD-10-CM

## 2019-11-08 DIAGNOSIS — Z20822 Contact with and (suspected) exposure to covid-19: Secondary | ICD-10-CM | POA: Diagnosis present

## 2019-11-08 DIAGNOSIS — M8618 Other acute osteomyelitis, other site: Secondary | ICD-10-CM | POA: Diagnosis not present

## 2019-11-08 DIAGNOSIS — L03115 Cellulitis of right lower limb: Secondary | ICD-10-CM | POA: Diagnosis present

## 2019-11-08 DIAGNOSIS — Z9049 Acquired absence of other specified parts of digestive tract: Secondary | ICD-10-CM

## 2019-11-08 DIAGNOSIS — Z87891 Personal history of nicotine dependence: Secondary | ICD-10-CM | POA: Diagnosis not present

## 2019-11-08 DIAGNOSIS — Z833 Family history of diabetes mellitus: Secondary | ICD-10-CM

## 2019-11-08 DIAGNOSIS — M86671 Other chronic osteomyelitis, right ankle and foot: Secondary | ICD-10-CM | POA: Diagnosis not present

## 2019-11-08 LAB — COMPREHENSIVE METABOLIC PANEL
ALT: 10 U/L (ref 0–44)
AST: 10 U/L — ABNORMAL LOW (ref 15–41)
Albumin: 2.8 g/dL — ABNORMAL LOW (ref 3.5–5.0)
Alkaline Phosphatase: 89 U/L (ref 38–126)
Anion gap: 12 (ref 5–15)
BUN: 18 mg/dL (ref 6–20)
CO2: 26 mmol/L (ref 22–32)
Calcium: 9.2 mg/dL (ref 8.9–10.3)
Chloride: 101 mmol/L (ref 98–111)
Creatinine, Ser: 0.88 mg/dL (ref 0.44–1.00)
GFR calc Af Amer: >60 mL/min (ref >60)
GFR calc non Af Amer: >60 mL/min (ref >60)
Glucose, Bld: 134 mg/dL — ABNORMAL HIGH (ref 70–99)
Potassium: 4.2 mmol/L (ref 3.5–5.1)
Sodium: 139 mmol/L (ref 135–145)
Total Bilirubin: 0.3 mg/dL (ref 0.3–1.2)
Total Protein: 7.5 g/dL (ref 6.5–8.1)

## 2019-11-08 LAB — CBC WITH DIFFERENTIAL/PLATELET
Abs Immature Granulocytes: 0.07 10*3/uL (ref 0.00–0.07)
Basophils Absolute: 0.1 10*3/uL (ref 0.0–0.1)
Basophils Relative: 1 %
Eosinophils Absolute: 0.3 10*3/uL (ref 0.0–0.5)
Eosinophils Relative: 3 %
HCT: 33 % — ABNORMAL LOW (ref 36.0–46.0)
Hemoglobin: 9.8 g/dL — ABNORMAL LOW (ref 12.0–15.0)
Immature Granulocytes: 1 %
Lymphocytes Relative: 31 %
Lymphs Abs: 3.2 10*3/uL (ref 0.7–4.0)
MCH: 24.7 pg — ABNORMAL LOW (ref 26.0–34.0)
MCHC: 29.7 g/dL — ABNORMAL LOW (ref 30.0–36.0)
MCV: 83.1 fL (ref 80.0–100.0)
Monocytes Absolute: 0.6 10*3/uL (ref 0.1–1.0)
Monocytes Relative: 6 %
Neutro Abs: 6.1 10*3/uL (ref 1.7–7.7)
Neutrophils Relative %: 58 %
Platelets: 441 10*3/uL — ABNORMAL HIGH (ref 150–400)
RBC: 3.97 MIL/uL (ref 3.87–5.11)
RDW: 13.8 % (ref 11.5–15.5)
WBC: 10.4 10*3/uL (ref 4.0–10.5)
nRBC: 0 % (ref 0.0–0.2)

## 2019-11-08 LAB — LACTIC ACID, PLASMA: Lactic Acid, Venous: 1.1 mmol/L (ref 0.5–1.9)

## 2019-11-08 MED ORDER — SODIUM CHLORIDE 0.9% FLUSH: 3.0000 mL | Freq: Once | INTRAVENOUS | Status: DC

## 2019-11-08 NOTE — Progress Notes (Signed)
Subjective: 59 year old female presents the office today for follow-up evaluation of a wound on the ball of her right foot.  Her husband had called the other day since showed increased drainage coming from the wound.  Start her on doxycycline and the earliest they can come to the office was today.  She presents today for follow-up evaluation.  She recently just got back from the beach where she was walking more.  Her husband noticed that the wound has worsened.  He has not seen any red streaks or infection going up the foot. Denies any fevers, chills, nausea, vomiting.  No calf pain, chest pain, shortness of breath.   Objective: AAO x3, NAD DP pulse palpable, DP pulse decreased but there is swelling to the left foot and ankle.  There is erythema to the lateral aspect of the foot ulceration to the fifth metatarsal head and there is probing directly down to bone with macerated tissue.  Serosanguineous drainage expressed with there is no frank purulence.  There is malodor coming from the wound. No pain with calf compression, swelling, warmth, erythema   Assessment: Osteomyelitis right foot  Plan: -All treatment options discussed with the patient including all alternatives, risks, complications.  -X-rays obtained reviewed.  Osteomyelitis present of the fifth metatarsal head as well as proximal phalanx base. -Given the wound as well as worsening infection, cellulitis I recommend admission to the hospital.  Discussed with her being admitted to the hospital start IV antibiotics and likely surgical intervention on Friday afternoon for partial fifth ray amputation.  I discussed the surgical postoperative course.  Previous an ABI was performed which was normal.  Would still check arterial studies as well.  We discussed alternatives, risks, complications of the surgery including, but not limited to, spread of infection, further amputation, general risk of surgery including stroke, heart attack, death.  I  called the ER to let them know of [place]'s arrival.   Vivi Barrack DPM

## 2019-11-08 NOTE — ED Triage Notes (Signed)
Pt arrives to ED w/ c/o infection to R foot. Sent by PCP for cellulitis/ostemylitis. Pt denies pain at this time.

## 2019-11-08 NOTE — ED Notes (Signed)
[phone] pts husband Darryl Agerton

## 2019-11-09 ENCOUNTER — Inpatient Hospital Stay (HOSPITAL_COMMUNITY): Payer: Medicare PPO | Admitting: Anesthesiology

## 2019-11-09 ENCOUNTER — Other Ambulatory Visit (INDEPENDENT_AMBULATORY_CARE_PROVIDER_SITE_OTHER): Payer: Self-pay | Admitting: Nurse Practitioner

## 2019-11-09 ENCOUNTER — Inpatient Hospital Stay (HOSPITAL_COMMUNITY): Payer: Medicare PPO

## 2019-11-09 ENCOUNTER — Encounter (HOSPITAL_COMMUNITY): Payer: Self-pay | Admitting: Internal Medicine

## 2019-11-09 ENCOUNTER — Encounter (HOSPITAL_COMMUNITY)
Admission: EM | Disposition: A | Discharge status: Home Health | Payer: Self-pay | Source: Ambulatory Visit | Attending: Internal Medicine

## 2019-11-09 DIAGNOSIS — K219 Gastro-esophageal reflux disease without esophagitis: Secondary | ICD-10-CM | POA: Diagnosis present

## 2019-11-09 DIAGNOSIS — E782 Mixed hyperlipidemia: Secondary | ICD-10-CM

## 2019-11-09 DIAGNOSIS — I10 Essential (primary) hypertension: Secondary | ICD-10-CM | POA: Diagnosis present

## 2019-11-09 DIAGNOSIS — Z20822 Contact with and (suspected) exposure to covid-19: Secondary | ICD-10-CM | POA: Diagnosis present

## 2019-11-09 DIAGNOSIS — E1169 Type 2 diabetes mellitus with other specified complication: Secondary | ICD-10-CM | POA: Diagnosis present

## 2019-11-09 DIAGNOSIS — L03115 Cellulitis of right lower limb: Secondary | ICD-10-CM | POA: Diagnosis present

## 2019-11-09 DIAGNOSIS — Z794 Long term (current) use of insulin: Secondary | ICD-10-CM | POA: Diagnosis not present

## 2019-11-09 DIAGNOSIS — E1142 Type 2 diabetes mellitus with diabetic polyneuropathy: Secondary | ICD-10-CM | POA: Diagnosis present

## 2019-11-09 DIAGNOSIS — E559 Vitamin D deficiency, unspecified: Secondary | ICD-10-CM | POA: Diagnosis present

## 2019-11-09 DIAGNOSIS — Z809 Family history of malignant neoplasm, unspecified: Secondary | ICD-10-CM | POA: Diagnosis not present

## 2019-11-09 DIAGNOSIS — Z8249 Family history of ischemic heart disease and other diseases of the circulatory system: Secondary | ICD-10-CM | POA: Diagnosis not present

## 2019-11-09 DIAGNOSIS — Z9049 Acquired absence of other specified parts of digestive tract: Secondary | ICD-10-CM | POA: Diagnosis not present

## 2019-11-09 DIAGNOSIS — Z87891 Personal history of nicotine dependence: Secondary | ICD-10-CM | POA: Diagnosis not present

## 2019-11-09 DIAGNOSIS — M869 Osteomyelitis, unspecified: Secondary | ICD-10-CM | POA: Diagnosis present

## 2019-11-09 DIAGNOSIS — Z7982 Long term (current) use of aspirin: Secondary | ICD-10-CM | POA: Diagnosis not present

## 2019-11-09 DIAGNOSIS — IMO0002 Reserved for concepts with insufficient information to code with codable children: Secondary | ICD-10-CM

## 2019-11-09 DIAGNOSIS — E669 Obesity, unspecified: Secondary | ICD-10-CM | POA: Diagnosis present

## 2019-11-09 DIAGNOSIS — E1165 Type 2 diabetes mellitus with hyperglycemia: Secondary | ICD-10-CM

## 2019-11-09 DIAGNOSIS — D509 Iron deficiency anemia, unspecified: Secondary | ICD-10-CM | POA: Diagnosis present

## 2019-11-09 DIAGNOSIS — Z8673 Personal history of transient ischemic attack (TIA), and cerebral infarction without residual deficits: Secondary | ICD-10-CM | POA: Diagnosis not present

## 2019-11-09 DIAGNOSIS — Z79899 Other long term (current) drug therapy: Secondary | ICD-10-CM | POA: Diagnosis not present

## 2019-11-09 DIAGNOSIS — M86171 Other acute osteomyelitis, right ankle and foot: Secondary | ICD-10-CM | POA: Diagnosis present

## 2019-11-09 DIAGNOSIS — Z6838 Body mass index (BMI) 38.0-38.9, adult: Secondary | ICD-10-CM | POA: Diagnosis not present

## 2019-11-09 DIAGNOSIS — Z833 Family history of diabetes mellitus: Secondary | ICD-10-CM | POA: Diagnosis not present

## 2019-11-09 DIAGNOSIS — M86671 Other chronic osteomyelitis, right ankle and foot: Secondary | ICD-10-CM | POA: Diagnosis not present

## 2019-11-09 HISTORY — PX: AMPUTATION: SHX166

## 2019-11-09 LAB — PROTIME-INR
INR: 1 (ref 0.8–1.2)
Prothrombin Time: 12.8 seconds (ref 11.4–15.2)

## 2019-11-09 LAB — FERRITIN: Ferritin: 118 ng/mL (ref 11–307)

## 2019-11-09 LAB — HEMOGLOBIN A1C
Hgb A1c MFr Bld: 9.3 % — ABNORMAL HIGH (ref 4.8–5.6)
Mean Plasma Glucose: 220.21 mg/dL

## 2019-11-09 LAB — CBC
HCT: 22 % — ABNORMAL LOW (ref 36.0–46.0)
Hemoglobin: 6.6 g/dL — CL (ref 12.0–15.0)
MCH: 25.3 pg — ABNORMAL LOW (ref 26.0–34.0)
MCHC: 30 g/dL (ref 30.0–36.0)
MCV: 84.3 fL (ref 80.0–100.0)
Platelets: 490 10*3/uL — ABNORMAL HIGH (ref 150–400)
RBC: 2.61 MIL/uL — ABNORMAL LOW (ref 3.87–5.11)
RDW: 14 % (ref 11.5–15.5)
WBC: 11.9 10*3/uL — ABNORMAL HIGH (ref 4.0–10.5)
nRBC: 0 % (ref 0.0–0.2)

## 2019-11-09 LAB — URINALYSIS, ROUTINE W REFLEX MICROSCOPIC
Bacteria, UA: NONE SEEN
Bilirubin Urine: NEGATIVE
Glucose, UA: >=500 mg/dL — AB
Hgb urine dipstick: NEGATIVE
Ketones, ur: 5 mg/dL — AB
Leukocytes,Ua: NEGATIVE
Nitrite: NEGATIVE
Protein, ur: 30 mg/dL — AB
Specific Gravity, Urine: 1.012 (ref 1.005–1.030)
pH: 5 (ref 5.0–8.0)

## 2019-11-09 LAB — GLUCOSE, CAPILLARY
Glucose-Capillary: 184 mg/dL — ABNORMAL HIGH (ref 70–99)
Glucose-Capillary: 116 mg/dL — ABNORMAL HIGH (ref 70–99)
Glucose-Capillary: 127 mg/dL — ABNORMAL HIGH (ref 70–99)
Glucose-Capillary: 105 mg/dL — ABNORMAL HIGH (ref 70–99)
Glucose-Capillary: 91 mg/dL (ref 70–99)
Glucose-Capillary: 200 mg/dL — ABNORMAL HIGH (ref 70–99)

## 2019-11-09 LAB — BASIC METABOLIC PANEL
Anion gap: 11 (ref 5–15)
BUN: 16 mg/dL (ref 6–20)
CO2: 25 mmol/L (ref 22–32)
Calcium: 8.8 mg/dL — ABNORMAL LOW (ref 8.9–10.3)
Chloride: 103 mmol/L (ref 98–111)
Creatinine, Ser: 0.83 mg/dL (ref 0.44–1.00)
GFR calc Af Amer: >60 mL/min (ref >60)
GFR calc non Af Amer: >60 mL/min (ref >60)
Glucose, Bld: 176 mg/dL — ABNORMAL HIGH (ref 70–99)
Potassium: 4.3 mmol/L (ref 3.5–5.1)
Sodium: 139 mmol/L (ref 135–145)

## 2019-11-09 LAB — IRON AND TIBC
Iron: 26 ug/dL — ABNORMAL LOW (ref 28–170)
Saturation Ratios: 8 % — ABNORMAL LOW (ref 10.4–31.8)
TIBC: 342 ug/dL (ref 250–450)
UIBC: 316 ug/dL

## 2019-11-09 LAB — HEMOGLOBIN AND HEMATOCRIT, BLOOD
HCT: 35.8 % — ABNORMAL LOW (ref 36.0–46.0)
Hemoglobin: 10.6 g/dL — ABNORMAL LOW (ref 12.0–15.0)

## 2019-11-09 LAB — ABO/RH: ABO/RH(D): O POS

## 2019-11-09 LAB — FOLATE: Folate: 24 ng/mL (ref >5.9)

## 2019-11-09 LAB — SARS CORONAVIRUS 2 (TAT 6-24 HRS): SARS Coronavirus 2: NEGATIVE

## 2019-11-09 LAB — PREPARE RBC (CROSSMATCH)

## 2019-11-09 LAB — VITAMIN B12: Vitamin B-12: 254 pg/mL (ref 180–914)

## 2019-11-09 LAB — HIV ANTIBODY (ROUTINE TESTING W REFLEX): HIV Screen 4th Generation wRfx: NONREACTIVE

## 2019-11-09 LAB — APTT: aPTT: 20 seconds — ABNORMAL LOW (ref 24–36)

## 2019-11-09 LAB — MRSA PCR SCREENING: MRSA by PCR: NEGATIVE

## 2019-11-09 SURGERY — AMPUTATION, FOOT, RAY
Anesthesia: Monitor Anesthesia Care | Laterality: Right

## 2019-11-09 MED ORDER — ACETAMINOPHEN 650 MG RE SUPP: 650.0000 mg | Freq: Four times a day (QID) | RECTAL | Status: DC | PRN

## 2019-11-09 MED ORDER — BUPIVACAINE HCL (PF) 0.5 % IJ SOLN
INTRAMUSCULAR | Status: AC
  Filled 2019-11-09: qty 30

## 2019-11-09 MED ORDER — LIDOCAINE HCL (PF) 1 % IJ SOLN
INTRAMUSCULAR | Status: AC
  Filled 2019-11-09: qty 30

## 2019-11-09 MED ORDER — FLUTICASONE PROPIONATE 50 MCG/ACT NA SUSP
2.0000 | Freq: Every day | NASAL | Status: DC
  Administered 2019-11-10: 2 via NASAL
  Filled 2019-11-09: qty 16

## 2019-11-09 MED ORDER — INSULIN ASPART 100 UNIT/ML ~~LOC~~ SOLN
0.0000 [IU] | Freq: Four times a day (QID) | SUBCUTANEOUS | Status: DC
  Administered 2019-11-09: 4 [IU] via SUBCUTANEOUS
  Administered 2019-11-09: 3 [IU] via SUBCUTANEOUS
  Administered 2019-11-09: 4 [IU] via SUBCUTANEOUS
  Administered 2019-11-10: 7 [IU] via SUBCUTANEOUS
  Administered 2019-11-10: 3 [IU] via SUBCUTANEOUS
  Administered 2019-11-10: 4 [IU] via SUBCUTANEOUS

## 2019-11-09 MED ORDER — MIDAZOLAM HCL 2 MG/2ML IJ SOLN
INTRAMUSCULAR | Status: DC | PRN
  Administered 2019-11-09: 1 mg via INTRAVENOUS

## 2019-11-09 MED ORDER — PHENYLEPHRINE 40 MCG/ML (10ML) SYRINGE FOR IV PUSH (FOR BLOOD PRESSURE SUPPORT)
PREFILLED_SYRINGE | INTRAVENOUS | Status: AC
  Filled 2019-11-09: qty 10

## 2019-11-09 MED ORDER — SODIUM CHLORIDE 0.9 % IV SOLN
2.0000 g | INTRAVENOUS | Status: DC
  Administered 2019-11-09 – 2019-11-10 (×2): 2 g via INTRAVENOUS
  Filled 2019-11-09: qty 2
  Filled 2019-11-09: qty 20
  Filled 2019-11-09: qty 2

## 2019-11-09 MED ORDER — VANCOMYCIN HCL 2000 MG/400ML IV SOLN
2000.0000 mg | Freq: Once | INTRAVENOUS | Status: AC
  Administered 2019-11-09: 2000 mg via INTRAVENOUS
  Filled 2019-11-09: qty 400

## 2019-11-09 MED ORDER — PROPOFOL 10 MG/ML IV BOLUS
INTRAVENOUS | Status: AC
  Filled 2019-11-09: qty 20

## 2019-11-09 MED ORDER — LACTATED RINGERS IV SOLN: INTRAVENOUS | Status: DC

## 2019-11-09 MED ORDER — LIDOCAINE 2% (20 MG/ML) 5 ML SYRINGE
INTRAMUSCULAR | Status: AC
  Filled 2019-11-09: qty 5

## 2019-11-09 MED ORDER — INSULIN GLARGINE 100 UNIT/ML SOLOSTAR PEN: 60.0000 [IU] | PEN_INJECTOR | Freq: Every day | SUBCUTANEOUS | Status: DC

## 2019-11-09 MED ORDER — MORPHINE SULFATE (PF) 4 MG/ML IV SOLN: 4.0000 mg | INTRAVENOUS | Status: DC | PRN

## 2019-11-09 MED ORDER — ONDANSETRON HCL 4 MG/2ML IJ SOLN: 4.0000 mg | Freq: Four times a day (QID) | INTRAMUSCULAR | Status: DC | PRN

## 2019-11-09 MED ORDER — SODIUM CHLORIDE 0.9% IV SOLUTION: Freq: Once | INTRAVENOUS | Status: DC

## 2019-11-09 MED ORDER — HYDRALAZINE HCL 20 MG/ML IJ SOLN: 10.0000 mg | Freq: Three times a day (TID) | INTRAMUSCULAR | Status: DC | PRN

## 2019-11-09 MED ORDER — LIDOCAINE 2% (20 MG/ML) 5 ML SYRINGE
INTRAMUSCULAR | Status: DC | PRN
  Administered 2019-11-09: 40 mg via INTRAVENOUS

## 2019-11-09 MED ORDER — 0.9 % SODIUM CHLORIDE (POUR BTL) OPTIME
TOPICAL | Status: DC | PRN
  Administered 2019-11-09: 16:00:00 1000 mL

## 2019-11-09 MED ORDER — SIMVASTATIN 20 MG PO TABS
40.0000 mg | ORAL_TABLET | Freq: Every day | ORAL | Status: DC
  Administered 2019-11-09: 40 mg via ORAL
  Filled 2019-11-09: qty 2

## 2019-11-09 MED ORDER — OXYBUTYNIN CHLORIDE 5 MG PO TABS
5.0000 mg | ORAL_TABLET | Freq: Two times a day (BID) | ORAL | Status: DC
  Administered 2019-11-09 – 2019-11-10 (×2): 5 mg via ORAL
  Filled 2019-11-09 (×2): qty 1

## 2019-11-09 MED ORDER — INSULIN GLARGINE 100 UNIT/ML ~~LOC~~ SOLN
60.0000 [IU] | Freq: Every day | SUBCUTANEOUS | Status: DC
  Administered 2019-11-09: 60 [IU] via SUBCUTANEOUS
  Filled 2019-11-09 (×2): qty 0.6

## 2019-11-09 MED ORDER — VANCOMYCIN HCL 1500 MG/300ML IV SOLN
1500.0000 mg | INTRAVENOUS | Status: DC
  Administered 2019-11-10: 1500 mg via INTRAVENOUS
  Filled 2019-11-09: qty 300

## 2019-11-09 MED ORDER — LIDOCAINE 1 % OPTIME INJ - NO CHARGE
INTRAMUSCULAR | Status: DC | PRN
  Administered 2019-11-09: 5 mL

## 2019-11-09 MED ORDER — ONDANSETRON HCL 4 MG/2ML IJ SOLN
INTRAMUSCULAR | Status: DC | PRN
  Administered 2019-11-09: 4 mg via INTRAVENOUS

## 2019-11-09 MED ORDER — PROPOFOL 500 MG/50ML IV EMUL
INTRAVENOUS | Status: DC | PRN
  Administered 2019-11-09: 100 ug/kg/min via INTRAVENOUS

## 2019-11-09 MED ORDER — BUPIVACAINE HCL 0.5 % IJ SOLN
INTRAMUSCULAR | Status: DC | PRN
  Administered 2019-11-09: 5 mL

## 2019-11-09 MED ORDER — ASPIRIN 325 MG PO TABS
325.0000 mg | ORAL_TABLET | Freq: Every day | ORAL | Status: DC
  Administered 2019-11-10: 325 mg via ORAL
  Filled 2019-11-09: qty 1

## 2019-11-09 MED ORDER — INSULIN ASPART 100 UNIT/ML ~~LOC~~ SOLN: 0.0000 [IU] | Freq: Three times a day (TID) | SUBCUTANEOUS | Status: DC

## 2019-11-09 MED ORDER — PANTOPRAZOLE SODIUM 40 MG PO TBEC
40.0000 mg | DELAYED_RELEASE_TABLET | Freq: Every day | ORAL | Status: DC
  Administered 2019-11-09 – 2019-11-10 (×2): 40 mg via ORAL
  Filled 2019-11-09 (×2): qty 1

## 2019-11-09 MED ORDER — MIDAZOLAM HCL 2 MG/2ML IJ SOLN
INTRAMUSCULAR | Status: AC
  Filled 2019-11-09: qty 2

## 2019-11-09 MED ORDER — GABAPENTIN 300 MG PO CAPS
600.0000 mg | ORAL_CAPSULE | Freq: Three times a day (TID) | ORAL | Status: DC
  Administered 2019-11-09 – 2019-11-10 (×3): 600 mg via ORAL
  Filled 2019-11-09 (×3): qty 2

## 2019-11-09 MED ORDER — ONDANSETRON HCL 4 MG PO TABS: 4.0000 mg | ORAL_TABLET | Freq: Four times a day (QID) | ORAL | Status: DC | PRN

## 2019-11-09 MED ORDER — ACETAMINOPHEN 325 MG PO TABS: 650.0000 mg | ORAL_TABLET | Freq: Four times a day (QID) | ORAL | Status: DC | PRN

## 2019-11-09 MED ORDER — POLYETHYLENE GLYCOL 3350 17 G PO PACK: 17.0000 g | PACK | Freq: Every day | ORAL | Status: DC | PRN

## 2019-11-09 MED ORDER — MORPHINE SULFATE (PF) 2 MG/ML IV SOLN: 2.0000 mg | INTRAVENOUS | Status: DC | PRN

## 2019-11-09 MED ORDER — CHLORHEXIDINE GLUCONATE CLOTH 2 % EX PADS: 6.0000 | MEDICATED_PAD | Freq: Once | CUTANEOUS | Status: DC

## 2019-11-09 MED ORDER — FENTANYL CITRATE (PF) 250 MCG/5ML IJ SOLN
INTRAMUSCULAR | Status: DC | PRN
  Administered 2019-11-09: 50 ug via INTRAVENOUS

## 2019-11-09 MED ORDER — PHENYLEPHRINE 40 MCG/ML (10ML) SYRINGE FOR IV PUSH (FOR BLOOD PRESSURE SUPPORT)
PREFILLED_SYRINGE | INTRAVENOUS | Status: DC | PRN
  Administered 2019-11-09: 120 ug via INTRAVENOUS
  Administered 2019-11-09: 200 ug via INTRAVENOUS
  Administered 2019-11-09: 160 ug via INTRAVENOUS
  Administered 2019-11-09: 120 ug via INTRAVENOUS

## 2019-11-09 MED ORDER — FENTANYL CITRATE (PF) 250 MCG/5ML IJ SOLN
INTRAMUSCULAR | Status: AC
  Filled 2019-11-09: qty 5

## 2019-11-09 MED ORDER — ONDANSETRON HCL 4 MG/2ML IJ SOLN
INTRAMUSCULAR | Status: AC
  Filled 2019-11-09: qty 2

## 2019-11-09 MED ORDER — RAMIPRIL 10 MG PO CAPS
20.0000 mg | ORAL_CAPSULE | Freq: Every day | ORAL | Status: DC
  Administered 2019-11-10: 20 mg via ORAL
  Filled 2019-11-09 (×2): qty 2

## 2019-11-09 MED ORDER — POTASSIUM CHLORIDE IN NACL 20-0.9 MEQ/L-% IV SOLN
INTRAVENOUS | Status: DC
  Filled 2019-11-09 (×2): qty 1000

## 2019-11-09 MED ORDER — ENOXAPARIN SODIUM 40 MG/0.4ML ~~LOC~~ SOLN
40.0000 mg | SUBCUTANEOUS | Status: DC
  Administered 2019-11-10: 40 mg via SUBCUTANEOUS
  Filled 2019-11-09: qty 0.4

## 2019-11-09 SURGICAL SUPPLY — 54 items
BANDAGE ESMARK 6X9 LF (GAUZE/BANDAGES/DRESSINGS) IMPLANT
BLADE AVERAGE 25MMX9MM (BLADE) ×1
BLADE AVERAGE 25X9 (BLADE) ×2 IMPLANT
BLADE SURG 10 STRL SS (BLADE) ×1 IMPLANT
BNDG CMPR 9X6 STRL LF SNTH (GAUZE/BANDAGES/DRESSINGS) ×1
BNDG COHESIVE 4X5 TAN STRL (GAUZE/BANDAGES/DRESSINGS) ×3 IMPLANT
BNDG ELASTIC 4X5.8 VLCR STR LF (GAUZE/BANDAGES/DRESSINGS) ×2 IMPLANT
BNDG ELASTIC 6X5.8 VLCR STR LF (GAUZE/BANDAGES/DRESSINGS) ×2 IMPLANT
BNDG ESMARK 6X9 LF (GAUZE/BANDAGES/DRESSINGS) ×3
BNDG GAUZE ELAST 4 BULKY (GAUZE/BANDAGES/DRESSINGS) ×2 IMPLANT
COVER WAND RF STERILE (DRAPES) ×1 IMPLANT
CUFF TOURN SGL QUICK 34 (TOURNIQUET CUFF)
CUFF TOURN SGL QUICK 42 (TOURNIQUET CUFF) IMPLANT
CUFF TRNQT CYL 34X4.125X (TOURNIQUET CUFF) IMPLANT
DECANTER SPIKE VIAL GLASS SM (MISCELLANEOUS) ×1 IMPLANT
DRAPE C-ARM MINI (DRAPES) ×1 IMPLANT
DRAPE U-SHAPE 47X51 STRL (DRAPES) ×4 IMPLANT
DRSG PAD ABDOMINAL 8X10 ST (GAUZE/BANDAGES/DRESSINGS) ×2 IMPLANT
DURAPREP 26ML APPLICATOR (WOUND CARE) ×1 IMPLANT
ELECT REM PT RETURN 9FT ADLT (ELECTROSURGICAL) ×3
ELECTRODE REM PT RTRN 9FT ADLT (ELECTROSURGICAL) ×1 IMPLANT
GAUZE SPONGE 4X4 12PLY STRL (GAUZE/BANDAGES/DRESSINGS) ×3 IMPLANT
GAUZE XEROFORM 5X9 LF (GAUZE/BANDAGES/DRESSINGS) ×1 IMPLANT
GLOVE BIO SURGEON STRL SZ8 (GLOVE) ×6 IMPLANT
GOWN STRL REUS W/ TWL LRG LVL3 (GOWN DISPOSABLE) ×1 IMPLANT
GOWN STRL REUS W/ TWL XL LVL3 (GOWN DISPOSABLE) ×1 IMPLANT
GOWN STRL REUS W/TWL LRG LVL3 (GOWN DISPOSABLE) ×3
GOWN STRL REUS W/TWL XL LVL3 (GOWN DISPOSABLE) ×3
KIT BASIN OR (CUSTOM PROCEDURE TRAY) ×3 IMPLANT
KIT TURNOVER KIT B (KITS) ×3 IMPLANT
NDL 25GX 5/8IN NON SAFETY (NEEDLE) ×1 IMPLANT
NDL HYPO 25X1 1.5 SAFETY (NEEDLE) IMPLANT
NEEDLE 25GX 5/8IN NON SAFETY (NEEDLE) IMPLANT
NEEDLE HYPO 25X1 1.5 SAFETY (NEEDLE) ×3 IMPLANT
NS IRRIG 1000ML POUR BTL (IV SOLUTION) ×3 IMPLANT
PACK ORTHO EXTREMITY (CUSTOM PROCEDURE TRAY) ×3 IMPLANT
PAD ARMBOARD 7.5X6 YLW CONV (MISCELLANEOUS) ×6 IMPLANT
PAD CAST 4YDX4 CTTN HI CHSV (CAST SUPPLIES) ×1 IMPLANT
PADDING CAST COTTON 4X4 STRL (CAST SUPPLIES)
SPONGE LAP 18X18 RF (DISPOSABLE) ×2 IMPLANT
STAPLER VISISTAT 35W (STAPLE) ×1 IMPLANT
STOCKINETTE IMPERVIOUS LG (DRAPES) ×2 IMPLANT
SUT ETHILON 2 0 PSLX (SUTURE) IMPLANT
SUT PROLENE 2 0 FS (SUTURE) ×2 IMPLANT
SUT PROLENE 3 0 PS 2 (SUTURE) ×4 IMPLANT
SWAB COLLECTION DEVICE MRSA (MISCELLANEOUS) ×2 IMPLANT
SWAB CULTURE ESWAB REG 1ML (MISCELLANEOUS) ×2 IMPLANT
SYR CONTROL 10ML LL (SYRINGE) ×3 IMPLANT
TOWEL GREEN STERILE (TOWEL DISPOSABLE) ×3 IMPLANT
TOWEL GREEN STERILE FF (TOWEL DISPOSABLE) ×1 IMPLANT
TUBE CONNECTING 12'X1/4 (SUCTIONS) ×1
TUBE CONNECTING 12X1/4 (SUCTIONS) ×2 IMPLANT
WATER STERILE IRR 1000ML POUR (IV SOLUTION) ×1 IMPLANT
YANKAUER SUCT BULB TIP NO VENT (SUCTIONS) ×3 IMPLANT

## 2019-11-09 NOTE — ED Notes (Signed)
Pt took own meds-- Gabapentin, Metformin, Oxybutanin. Held ASA

## 2019-11-09 NOTE — Progress Notes (Signed)
Pharmacy Antibiotic Note  Melanie Spencer is a 59 y.o. female admitted on 11/08/2019 with purulent discharge from wound.  Patient was sent by podiatrist for IV antibiotics and possible amputation.  Pharmacy has been consulted for vancomycin dosing for osteomyelitis.  She is also starting Rocephin.  SCr 0.88, CrCL 87 ml/min, afebrile, WBC WNL, La 1.1.  Plan: Vanc 2gm IV x 1, then 1500mg  IV Q24H for AUC 464 using SCr 0.88 CTX 2gm IV Q24H per MD Monitor renal fxn, clinical progress, vanc AUC as needed  Height: 5' 6.5" (168.9 cm) Weight: 109.8 kg (242 lb) IBW/kg (Calculated) : 60.45  Temp (24hrs), Avg:98.2 F (36.8 C), Min:98 F (36.7 C), Max:98.5 F (36.9 C)  Recent Labs  Lab 11/08/19 1909  WBC 10.4  CREATININE 0.88  LATICACIDVEN 1.1    Estimated Creatinine Clearance: 87.1 mL/min (by C-G formula based on SCr of 0.88 mg/dL).    No Known Allergies   Melanie Spencer D. 11/10/19, PharmD, BCPS, BCCCP 11/09/2019, 6:41 AM

## 2019-11-09 NOTE — Anesthesia Postprocedure Evaluation (Signed)
Anesthesia Post Note  Patient: Melanie Spencer  Procedure(s) Performed: 5TH AMPUTATION RAY (Right )     Patient location during evaluation: PACU Anesthesia Type: MAC Level of consciousness: awake and alert Pain management: pain level controlled Vital Signs Assessment: post-procedure vital signs reviewed and stable Respiratory status: spontaneous breathing, nonlabored ventilation, respiratory function stable and patient connected to nasal cannula oxygen Cardiovascular status: stable and blood pressure returned to baseline Postop Assessment: no apparent nausea or vomiting Anesthetic complications: no    Last Vitals:  Vitals:   11/09/19 1700 11/09/19 1959  BP: (!) 158/78 (!) 146/66  Pulse: 67 80  Resp: 17 18  Temp: 37.1 C 36.6 C  SpO2: 99% 96%    Last Pain:  Vitals:   11/09/19 2000  TempSrc:   PainSc: Tyler Deis

## 2019-11-09 NOTE — H&P (Signed)
History and Physical    Melanie Spencer DHR:[id number] DOB: Sep 06, 1960 DOA: 11/08/2019  PCP: Doree Albee, MD  Patient coming from: Home   Chief Complaint:  Chief Complaint  Patient presents with  . Wound Infection     HPI:    59 year old female with past medical history of poorly controlled diabetes mellitus, hyperlipidemia, previous stroke, hypertension who presents to [facility] emergency department at the direction of Dr. Earleen Newport with due to concerns for right foot osteomyelitis.  Patient explains that approximately 8 months ago she developed a wound over the lateral aspect of her right foot.  She has since followed with Dr. Earleen Newport with [facility] for management and has received various iterations of local wound care and topical antibiotic therapy for this wound with some improvement in the ulceration.  Unfortunately, approximately 4 days ago while the patient went to the beach she began to experience sudden drainage and pain from the wound.  The wound causing moderate to severe pain, sharp and throbbing in quality, nonradiating, worse with movement of the affected extremity as well as weightbearing.  This is associated with increasing redness and swelling of the foot with associated foul-smelling drainage.  Patient denies any associated fevers, nausea, vomiting, weakness or changes in appetite.  Patient presented to her podiatrist office on 4/15 for evaluation of both physical examination and x-ray of the right foot was consistent with osteomyelitis of the fifth metatarsal.  Patient was instructed to go to [facility] emergency department for evaluation and admission to the hospital service with the expectation that the patient would go to the operating room the afternoon of 4/16 for partial fifth ray amputation.  In the emergency department, after basic labs were drawn the hospitalist group was called to assess patient for admission to the hospital.    Review of  Systems: A 10-system review of systems has been performed and all systems are negative with the exception of what is listed in the HPI.    Past Medical History:  Diagnosis Date  . Cerebrovascular disease 05/23/2019  . Essential hypertension, benign 05/23/2019  . History of stroke 2007   left leg weakness  . HLD (hyperlipidemia) 05/23/2019  . Morbid obesity ([place]) 05/23/2019  . Type II diabetes mellitus, uncontrolled ([place]) 05/23/2019  . Vitamin D deficiency     Past Surgical History:  Procedure Laterality Date  . CHOLECYSTECTOMY       reports that she quit smoking about 14 years ago. Her smoking use included cigarettes. She has a 40.00 pack-year smoking history. She has never used smokeless tobacco. She reports that she does not drink alcohol or use drugs.  No Known Allergies  Family History  Problem Relation Age of Onset  . Heart disease Mother   . Diabetes Mother   . Hypertension Mother   . Cancer Father        Lung  . Hypertension Sister   . Macular degeneration Sister   . Appendicitis Brother   . Hypertension Sister   . Diabetes Sister      Prior to Admission medications   Medication Sig Start Date End Date Taking? Authorizing Provider  aspirin 325 MG tablet Take 325 mg by mouth daily.   Yes [provider]  cholecalciferol (VITAMIN D) 1000 UNITS tablet Take 1,000 Units by mouth daily.    Yes [provider]  doxycycline (VIBRA-TABS) 100 MG tablet Take 1 tablet (100 mg total) by mouth 2 (two) times daily. 11/06/19  Yes Celesta Gentile  R, DPM  gabapentin (NEURONTIN) 300 MG capsule TAKE 2 CAPSULES BY MOUTH THREE TIMES DAILY Patient taking differently: Take 600 mg by mouth 3 (three) times daily.  08/16/19 11/09/19 Yes Ailene Ards, NP  LANTUS SOLOSTAR 100 UNIT/ML Solostar Pen ADMINISTER 75 UNITS UNDER THE SKIN AT BEDTIME Patient taking differently: Inject 75 Units into the skin at bedtime.  11/06/19  Yes Gosrani, Nimish C, MD  metFORMIN (GLUCOPHAGE) 1000  MG tablet TAKE 1 TABLET BY MOUTH TWICE DAILY Patient taking differently: Take 1,000 mg by mouth 2 (two) times daily with a meal.  08/16/19 11/09/19 Yes Ailene Ards, NP  omeprazole (PRILOSEC) 20 MG capsule TAKE 1 TABLET BY MOUTH DAILY Patient taking differently: Take 20 mg by mouth daily.  08/16/19  Yes Ailene Ards, NP  oxybutynin (DITROPAN) 5 MG tablet TAKE 1 TABLET(5 MG) BY MOUTH THREE TIMES DAILY Patient taking differently: Take 5 mg by mouth 2 (two) times daily.  08/31/19  Yes Gosrani, Nimish C, MD  ramipril (ALTACE) 10 MG capsule Take 2 capsules (20 mg total) by mouth daily. 06/25/19  Yes Gosrani, Nimish C, MD  simvastatin (ZOCOR) 40 MG tablet TAKE 1 TABLET BY MOUTH AT BEDTIME Patient taking differently: Take 40 mg by mouth at bedtime.  10/10/19  Yes Gosrani, Nimish C, MD  TRULICITY 0.37 QD/6.4RC SOPN ADMINISTER 0.75 MG UNDER THE SKIN 1 TIME A WEEK Patient taking differently: Inject 0.75 mg into the skin every Sunday.  10/26/19  Yes Doree Albee, MD  ACCU-CHEK GUIDE test strip  08/17/19   [provider]  amLODipine (NORVASC) 2.5 MG tablet Take 1 tablet (2.5 mg total) by mouth daily. Patient not taking: Reported on 11/09/2019 08/02/19   Ailene Ards, NP  Blood Glucose Monitoring Suppl (ACCU-CHEK GUIDE ME) w/Device KIT  08/07/19   [provider]  Blood Pressure Monitoring (BLOOD PRESSURE MONITOR AUTOMAT) DEVI Check blood pressure at home daily 08/02/19   Ailene Ards, NP  fluticasone [facility]) 50 MCG/ACT nasal spray SHAKE LIQUID AND USE 2 SPRAYS IN [facility] NOSTRIL EVERY DAY 08/16/19 09/15/19  Ailene Ards, NP  fluticasone [facility]) 50 MCG/ACT nasal spray Place 1 spray into both nostrils daily.    [provider]  gabapentin (NEURONTIN) 300 MG capsule Take 300 mg by mouth 2 (two) times daily.    [provider]  LANTUS SOLOSTAR 100 UNIT/ML Solostar Pen Inject 75 Units into the skin at bedtime. Patient taking differently: Inject 62 Units into the skin at bedtime.   05/18/19   Doree Albee, MD  LANTUS SOLOSTAR 100 UNIT/ML Solostar Pen ADMINISTER 75 UNITS UNDER THE SKIN AT BEDTIME 09/12/19   Ailene Ards, NP  metFORMIN (GLUCOPHAGE) 1000 MG tablet 1,000 mg 2 (two) times daily with a meal.  04/24/15   [provider]  simvastatin (ZOCOR) 40 MG tablet Take 1 tablet (40 mg total) by mouth daily at 6 PM. 04/03/19   Hurshel Party C, MD  simvastatin (ZOCOR) 40 MG tablet TAKE 1 TABLET BY MOUTH AT BEDTIME 07/30/19   Ailene Ards, NP    Physical Exam: Vitals:   11/08/19 2127 11/08/19 2344 11/09/19 0222 11/09/19 0554  BP: (!) 175/70 (!) 160/66 (!) 180/81 (!) 146/92  Pulse: 74 90 73 78  Resp: 16 17 18 16   Temp:  98 F (36.7 C)  98.2 F (36.8 C)  TempSrc:    Oral  SpO2: 98% 98% 98% 99%  Weight:    109.8 kg  Height:  5' 6.5" (1.689 m)    Constitutional: Acute alert and oriented x3, no associated distress.  Patient is obese. Skin: Notable deep ulceration that probes to the bone on the lateral aspect of the right foot with some serosanguineous drainage and associated foul smell.  Otherwise, no rashes, no lesions with good skin turgor noted.   Eyes: Pupils are equally reactive to light.  No evidence of scleral icterus or conjunctival pallor.  ENMT: Mucous membranes are moist. Posterior pharynx clear of any exudate or lesions. Normal dentition.   Neck: normal, supple, no masses, no thyromegaly Respiratory: clear to auscultation bilaterally, no wheezing no crackles. Normal respiratory effort. No accessory muscle use.  Cardiovascular: Regular rate and rhythm, 2 out of 6 systolic murmur noted.  No extremity edema. 2+ pedal pulses. No carotid bruits.  Back:   Nontender without crepitus or deformity. Abdomen: Abdomen is protuberant, soft  and nontender.  No evidence of intra-abdominal masses.  Positive bowel sounds noted in all quadrants.   Musculoskeletal: Notable edema of the right foot with ulceration noted in the skin examination.  There is surrounding  redness swelling and induration.  The right foot is tender to palpation.  Otherwise, good ROM, no contractures. Normal muscle tone.  Neurologic: CN 2-12 grossly intact. Sensation intact, strength noted to be 5 out of 5 in all 4 extremities.  Patient is following all commands.  Patient is responsive to verbal stimuli.   Psychiatric: Patient presents as a normal mood with appropriate affect.  Patient seems to possess insight as to theircurrent situation.     Labs on Admission: I have personally reviewed following labs and imaging studies -   CBC: Recent Labs  Lab 11/08/19 1909  WBC 10.4  NEUTROABS 6.1  HGB 9.8*  HCT 33.0*  MCV 83.1  PLT 832*   Basic Metabolic Panel: Recent Labs  Lab 11/08/19 1909  NA 139  K 4.2  CL 101  CO2 26  GLUCOSE 134*  BUN 18  CREATININE 0.88  CALCIUM 9.2   GFR: Estimated Creatinine Clearance: 87.1 mL/min (by C-G formula based on SCr of 0.88 mg/dL). Liver Function Tests: Recent Labs  Lab 11/08/19 1909  AST 10*  ALT 10  ALKPHOS 89  BILITOT 0.3  PROT 7.5  ALBUMIN 2.8*   No results for input(s): LIPASE, AMYLASE in the last 168 hours. No results for input(s): AMMONIA in the last 168 hours. Coagulation Profile: No results for input(s): INR, PROTIME in the last 168 hours. Cardiac Enzymes: No results for input(s): CKTOTAL, CKMB, CKMBINDEX, TROPONINI in the last 168 hours. BNP (last 3 results) No results for input(s): PROBNP in the last 8760 hours. HbA1C: No results for input(s): HGBA1C in the last 72 hours. CBG: No results for input(s): GLUCAP in the last 168 hours. Lipid Profile: No results for input(s): CHOL, HDL, LDLCALC, TRIG, CHOLHDL, LDLDIRECT in the last 72 hours. Thyroid Function Tests: No results for input(s): TSH, T4TOTAL, FREET4, T3FREE, THYROIDAB in the last 72 hours. Anemia Panel: No results for input(s): VITAMINB12, FOLATE, FERRITIN, TIBC, IRON, RETICCTPCT in the last 72 hours. Urine analysis:    Component Value Date/Time    COLORURINE YELLOW 11/09/2019 [place] 11/09/2019 0554   LABSPEC 1.012 11/09/2019 0554   PHURINE 5.0 11/09/2019 0554   GLUCOSEU >=500 (A) 11/09/2019 0554   HGBUR NEGATIVE 11/09/2019 0554   BILIRUBINUR NEGATIVE 11/09/2019 0554   KETONESUR 5 (A) 11/09/2019 0554   PROTEINUR 30 (A) 11/09/2019 0554   NITRITE NEGATIVE 11/09/2019 0554  LEUKOCYTESUR NEGATIVE 11/09/2019 0554    Radiological Exams on Admission, results personally reviewed: Right foot x-ray (11/08/2019): Dense of osteomyelitis of the fifth metatarsal head as well as the proximal phalanx base.  Assessment/Plan Active Problems:   Acute osteomyelitis of right foot [facility])   Patient has evidence clinically of osteomyelitis with a deep wound that probes to the bone on the lateral aspect of the right foot with associated drainage and foul smell.    There is some evidence of mild surrounding cellulitis with redness induration and tenderness.  Patient has been placed on empiric intravenous antibiotic therapy with ceftriaxone and vancomycin  Hydrating the patient gently with intravenous isotonic fluids  After review of podiatry note, patient is expected to go to the operating room the afternoon of 4/16, will make patient n.p.o. now.  Providing patient with as needed opiate-based analgesics for associated pain  Obtaining 2 sets of blood cultures    Essential hypertension, benign   Continue home regimen of antihypertensive therapy    Mixed hyperlipidemia due to type 2 diabetes mellitus ([place])   Continue home regimen of statin therapy    Uncontrolled type 2 diabetes mellitus with diabetic polyneuropathy, with long-term current use of insulin ([place])   Patient has extremely poorly controlled diabetes mellitus, likely the culprit for the patient's wound infection and osteomyelitis  Placing patient on high intensity sliding scale and continuing home regimen of Lantus 60 units nightly  Holding home regimen of  Metformin  Recent hemoglobin A1c performed in February found to be 9.4%.  Blood sugars are markedly elevated this morning due to patient not receiving her Lantus yesterday evening in the emergency department.  Will attempt to compensate for this with high doses of short acting subcutaneous insulin.     Code Status:  Full code Family Communication: Husband is at bedside Disposition Plan: Patient is anticipated to be discharged to home once patient has met maximum benefit from current hospitalization.   Consults called: Dr. Earleen Newport with Podiatry, he is to take patient to operating room the afternoon of 4/16. Admission status: Patient will be admitted to Observation and is anticipated to remain in the hospital for less than 2 midnights.   Vernelle Emerald MD [facility] Pager [phone]  If 7PM-7AM, please contact night-coverage [email or web address] Use universal [place] password for that web site. If you do not have the password, please call the hospital operator.  11/09/2019, 6:51 AM

## 2019-11-09 NOTE — Anesthesia Procedure Notes (Signed)
Procedure Name: MAC Date/Time: 11/09/2019 3:30 PM Performed by: Barrington Ellison, CRNA Pre-anesthesia Checklist: Patient identified, Emergency Drugs available, Suction available, Patient being monitored and Timeout performed Patient Re-evaluated:Patient Re-evaluated prior to induction Oxygen Delivery Method: Simple face mask

## 2019-11-09 NOTE — Progress Notes (Signed)
Orthopedic Tech Progress Note Patient Details:  Melanie Spencer June 19, 1961 [id number]  Ortho Devices Type of Ortho Device: Postop shoe/boot Ortho Device/Splint Location: RLE Ortho Device/Splint Interventions: Ordered, Application   Post Interventions Patient Tolerated: Well Instructions Provided: Care of device   Melanie Spencer 11/09/2019, 6:04 PM

## 2019-11-09 NOTE — Brief Op Note (Signed)
11/09/2019  4:23 PM  PATIENT:  Melanie Spencer  59 y.o. female  PRE-OPERATIVE DIAGNOSIS:  Osteomyelitis  POST-OPERATIVE DIAGNOSIS:  Osteomyelitis  PROCEDURE:  Procedure(s): 5TH AMPUTATION RAY (Right)  SURGEON:  Surgeon(s) and Role:    * Trula Slade, DPM - Primary  PHYSICIAN ASSISTANT:   ASSISTANTS: none   ANESTHESIA:   MAC  EBL:  25 cc   BLOOD ADMINISTERED:none  DRAINS: none   LOCAL MEDICATIONS USED:  OTHER 10 cc lidocaine and marcaine plain  SPECIMEN:  Source of Specimen:  wound culture   DISPOSITION OF SPECIMEN:  micro  COUNTS:  YES  TOURNIQUET:  * No tourniquets in log *  DICTATION: .Dragon Dictation  PLAN OF CARE: Admit to inpatient   PATIENT DISPOSITION:  PACU - hemodynamically stable.   Delay start of Pharmacological VTE agent (>24hrs) due to surgical blood loss or risk of bleeding: no  Intraoperative findings: Osteomyelitis of the 5th metatarsal head noted. No signs of abscess. After debridement the wound appeared viable. There was no proximal or medial tracking. The 4th metatarsal was not exposed and clinically didn't appear infected. Continue IV ABX. Dr. Posey Pronto, DPM will change the bandage tomorrow. Dispo pending clinical response.   Celesta Gentile, DPM O: [phone] C: [phone]

## 2019-11-09 NOTE — ED Provider Notes (Signed)
[facility] EMERGENCY DEPARTMENT Provider Note  CSN: [id number] Arrival date & time: 11/08/19 1803  Chief Complaint(s) Wound Infection  HPI Melanie Spencer is a 59 y.o. female with a history of diabetes and chronic foot wound being cared for by podiatry presents to the emergency department for osteomyelitis of the right fifth metatarsal related to the wound.  Patient was sent by podiatry for admission, biopsy and IV antibiotic treatment and possible amputation.  Patient was started on doxycycline 2 days ago.  She reports that a week ago the wound began to smell and have a purulent discharge.  This was after a trip to the beach.  She denies any water or sand exposure.  No fevers or chills.  She is endorsing mild pain with ambulation.  No other physical complaints.  HPI  Past Medical History Past Medical History:  Diagnosis Date  . Cerebrovascular disease 05/23/2019  . Essential hypertension, benign 05/23/2019  . HLD (hyperlipidemia) 05/23/2019  . Morbid obesity ([place]) 05/23/2019  . Stroke ([place])   . Type II diabetes mellitus, uncontrolled ([place]) 05/23/2019  . Vitamin D deficiency    Patient Active Problem List   Diagnosis Date Noted  . Urinary incontinence 08/29/2019  . Essential hypertension, benign 05/23/2019  . Type II diabetes mellitus, uncontrolled ([place]) 05/23/2019  . Morbid obesity ([place]) 05/23/2019  . HLD (hyperlipidemia) 05/23/2019  . Cerebrovascular disease 05/23/2019  . Stroke [facility])    Home Medication(s) Prior to Admission medications   Medication Sig Start Date End Date Taking? Authorizing Provider  ACCU-CHEK GUIDE test strip  08/17/19   [provider]  amLODipine (NORVASC) 2.5 MG tablet Take 1 tablet (2.5 mg total) by mouth daily. 08/02/19   Ailene Ards, NP  aspirin 325 MG tablet Take 325 mg by mouth daily.    [provider]  Blood Glucose Monitoring Suppl (ACCU-CHEK GUIDE ME) w/Device KIT  08/07/19   [provider]  Blood  Pressure Monitoring (BLOOD PRESSURE MONITOR AUTOMAT) DEVI Check blood pressure at home daily 08/02/19   Ailene Ards, NP  cholecalciferol (VITAMIN D) 1000 UNITS tablet Take 5,000 Units by mouth daily.     [provider]  doxycycline (VIBRA-TABS) 100 MG tablet Take 1 tablet (100 mg total) by mouth 2 (two) times daily. 11/06/19   Trula Slade, DPM  fluticasone (FLONASE) 50 MCG/ACT nasal spray SHAKE LIQUID AND USE 2 SPRAYS IN [facility] NOSTRIL EVERY DAY 08/16/19 09/15/19  Ailene Ards, NP  gabapentin (NEURONTIN) 300 MG capsule TAKE 2 CAPSULES BY MOUTH THREE TIMES DAILY 08/16/19 09/15/19  Ailene Ards, NP  LANTUS SOLOSTAR 100 UNIT/ML Solostar Pen ADMINISTER 75 UNITS UNDER THE SKIN AT BEDTIME 11/06/19   Hurshel Party C, MD  metFORMIN (GLUCOPHAGE) 1000 MG tablet TAKE 1 TABLET BY MOUTH TWICE DAILY 08/16/19 09/15/19  Ailene Ards, NP  omeprazole (PRILOSEC) 20 MG capsule TAKE 1 TABLET BY MOUTH DAILY 08/16/19   Ailene Ards, NP  oxybutynin (DITROPAN) 5 MG tablet TAKE 1 TABLET(5 MG) BY MOUTH THREE TIMES DAILY 08/31/19   Hurshel Party C, MD  ramipril (ALTACE) 10 MG capsule Take 2 capsules (20 mg total) by mouth daily. 06/25/19   Hurshel Party C, MD  simvastatin (ZOCOR) 40 MG tablet TAKE 1 TABLET BY MOUTH AT BEDTIME 10/10/19   Gosrani, Nimish C, MD  TRULICITY 5.78 IO/9.6EX SOPN ADMINISTER 0.75 MG UNDER THE SKIN 1 TIME A WEEK 10/26/19   Gosrani, Nimish C, MD  fluticasone (FLONASE) 50 MCG/ACT nasal spray  Place 1 spray into both nostrils daily.    [provider]  gabapentin (NEURONTIN) 300 MG capsule Take 300 mg by mouth 2 (two) times daily.    [provider]  LANTUS SOLOSTAR 100 UNIT/ML Solostar Pen Inject 75 Units into the skin at bedtime. Patient taking differently: Inject 62 Units into the skin at bedtime.  05/18/19   Doree Albee, MD  LANTUS SOLOSTAR 100 UNIT/ML Solostar Pen ADMINISTER 75 UNITS UNDER THE SKIN AT BEDTIME 09/12/19   Ailene Ards, NP  metFORMIN (GLUCOPHAGE) 1000 MG  tablet 1,000 mg 2 (two) times daily with a meal.  04/24/15   [provider]  simvastatin (ZOCOR) 40 MG tablet Take 1 tablet (40 mg total) by mouth daily at 6 PM. 04/03/19   Hurshel Party C, MD  simvastatin (ZOCOR) 40 MG tablet TAKE 1 TABLET BY MOUTH AT BEDTIME 07/30/19   Ailene Ards, NP                                                                                                                                    Past Surgical History Past Surgical History:  Procedure Laterality Date  . CHOLECYSTECTOMY     Family History Family History  Problem Relation Age of Onset  . Heart disease Mother   . Diabetes Mother   . Hypertension Mother   . Cancer Father        Lung  . Hypertension Sister   . Macular degeneration Sister   . Appendicitis Brother   . Hypertension Sister   . Diabetes Sister     Social History Social History   Tobacco Use  . Smoking status: Former Smoker    Packs/day: 2.00    Years: 20.00    Pack years: 40.00    Types: Cigarettes    Quit date: 07/26/2005    Years since quitting: 14.2  . Smokeless tobacco: Never Used  Substance Use Topics  . Alcohol use: No    Alcohol/week: 0.0 standard drinks  . Drug use: No   Allergies Patient has no known allergies.  Review of Systems Review of Systems All other systems are reviewed and are negative for acute change except as noted in the HPI  Physical Exam Vital Signs  I have reviewed the triage vital signs BP (!) 180/81 (BP Location: Right Arm)   Pulse 73   Temp 98 F (36.7 C)   Resp 18   LMP 03/02/2012   SpO2 98%   Physical Exam Vitals reviewed.  Constitutional:      General: She is not in acute distress.    Appearance: She is well-developed. She is obese. She is not diaphoretic.  HENT:     Head: Normocephalic and atraumatic.     Right Ear: External ear normal.     Left Ear: External ear normal.     Nose: Nose normal.  Eyes:     General: No  scleral icterus.    Conjunctiva/sclera:  Conjunctivae normal.  Neck:     Trachea: Phonation normal.  Cardiovascular:     Rate and Rhythm: Normal rate and regular rhythm.  Pulmonary:     Effort: Pulmonary effort is normal. No respiratory distress.     Breath sounds: No stridor.  Abdominal:     General: There is no distension.  Musculoskeletal:        General: Normal range of motion.     Cervical back: Normal range of motion.  Feet:     Right foot:     Skin integrity: Ulcer (see image) present.  Neurological:     Mental Status: She is alert and oriented to person, place, and time.  Psychiatric:        Behavior: Behavior normal.       ED Results and Treatments Labs (all labs ordered are listed, but only abnormal results are displayed) Labs Reviewed  COMPREHENSIVE METABOLIC PANEL - Abnormal; Notable for the following components:      Result Value   Glucose, Bld 134 (*)    Albumin 2.8 (*)    AST 10 (*)    All other components within normal limits  CBC WITH DIFFERENTIAL/PLATELET - Abnormal; Notable for the following components:   Hemoglobin 9.8 (*)    HCT 33.0 (*)    MCH 24.7 (*)    MCHC 29.7 (*)    Platelets 441 (*)    All other components within normal limits  SARS CORONAVIRUS 2 (TAT 6-24 HRS)  LACTIC ACID, PLASMA  LACTIC ACID, PLASMA  URINALYSIS, ROUTINE W REFLEX MICROSCOPIC                                                                                                                         EKG  EKG Interpretation  Date/Time:    Ventricular Rate:    PR Interval:    QRS Duration:   QT Interval:    QTC Calculation:   R Axis:     Text Interpretation:        Radiology DG Foot Complete Right  Result Date: 11/08/2019 Please see detailed radiograph report in office note.   Pertinent labs & imaging results that were available during my care of the patient were reviewed by me and considered in my medical decision making (see chart for details).  Medications Ordered in ED Medications  sodium  chloride flush (NS) 0.9 % injection 3 mL (has no administration in time range)  Procedures Procedures  (including critical care time)  Medical Decision Making / ED Course I have reviewed the nursing notes for this encounter and the patient's prior records (if available in EHR or on provided paperwork).   Melanie Spencer was evaluated in Emergency Department on 11/09/2019 for the symptoms described in the history of present illness. She was evaluated in the context of the global COVID-19 pandemic, which necessitated consideration that the patient might be at risk for infection with the SARS-CoV-2 virus that causes COVID-19. Institutional protocols and algorithms that pertain to the evaluation of patients at risk for COVID-19 are in a state of rapid change based on information released by regulatory bodies including the CDC and federal and state organizations. These policies and algorithms were followed during the patient's care in the ED.  Plain film obtained yesterday by per dietary shows evidence of osteomyelitis of the fifth metatarsal head and and proximal phalanx base.  Labs are grossly reassuring.  Patient is not septic.  Will admit to medicine for further work-up and management.      Final Clinical Impression(s) / ED Diagnoses Final diagnoses:  Other acute osteomyelitis of right foot ([place])      This chart was dictated using voice recognition software.  Despite best efforts to proofread,  errors can occur which can change the documentation meaning.   Fatima Blank, MD 11/09/19 [phone]

## 2019-11-09 NOTE — Progress Notes (Signed)
PROGRESS NOTE  Melanie Spencer BVQ:[id number] DOB: 02-09-1961 DOA: 11/08/2019 PCP: [name], MD  HPI/Recap of past 24 hours: HPI from Dr Leafy Half  59 year old female with past medical history of poorly controlled diabetes mellitus, hyperlipidemia, previous stroke, hypertension presents to ED at the direction of Dr. Ardelle Anton due to concerns for right foot osteomyelitis. Patient reports that approximately 8 months ago, developed a wound over the lateral aspect of her right foot, has been followed with Dr. Loreta Ave, and has received various local wound care and topical antibiotic therapy with some mild improvement. Approximately 4 days ago while the patient went to the beach she began to experience foul smelling drainage and pain from the wound. Patient presented to her podiatrist office on 4/15 for further evaluation of which x-ray of the right foot was consistent with osteomyelitis of the fifth metatarsal. Pt admitted for further management.     Today, patient denies any new complaints, denied any pain around her right foot.  CBC drawn this morning showed a drop of hemoglobin to 6.6 from 9.8, repeat 2 hours later showed 10.6.  No blood transfusion was given.  Patient for surgery with podiatry today.  Patient denies any chest pain, abdominal pain, nausea/vomiting, fever/chills, diarrhea.     Assessment/Plan: Active Problems:   Essential hypertension, benign   Mixed hyperlipidemia due to type 2 diabetes mellitus (HCC)   Uncontrolled type 2 diabetes mellitus with diabetic polyneuropathy, with long-term current use of insulin (HCC)   Acute osteomyelitis of right foot (HCC)   Osteomyelitis (HCC)   Osteomyelitis of the right foot/diabetic foot wound Currently afebrile, with leukocytosis BC x2 pending X-ray showed osteomyelitis of the fifth metatarsal head as well as phalanx of the right foot Podiatry on board, plan for partial fifth ray amputation on 11/09/2019 Continue ceftriaxone,  vancomycin Monitor closely  Diabetes mellitus type 2/neuropathy Last A1c 9.3, uncontrolled SSI, Accu-Cheks, hypoglycemic protocol, Lantus at bedtime Hold home Metformin, Trulicity Continue gabapentin  Hypertension BP stable Continue home regimen, ramipril  Hyperlipidemia/history of CVA Continue statins, ASA 325 mg   GERD Continue PPI         Malnutrition Type:      Malnutrition Characteristics:      Nutrition Interventions:       Estimated body mass index is 38.47 kg/m as calculated from the following:   Height as of this encounter: 5' 6.5" (1.689 m).   Weight as of this encounter: 109.8 kg.     Code Status: Full  Family Communication: Husband at bedside on 11/09/2019  Disposition Plan: Patient came from home, disposition pending PT/OT, disposition date pending podiatry signing off   Consultants:  Podiatry  Procedures:  None  Antimicrobials:  Vancomycin  Ceftriaxone  DVT prophylaxis: Lovenox currently on hold   Objective: Vitals:   11/09/19 0855 11/09/19 0915 11/09/19 1122 11/09/19 1300  BP: (!) 143/70 138/66 127/76 (!) 148/68  Pulse: 72 70 60 75  Resp:  18 18 18   Temp:  98.7 F (37.1 C) 98.5 F (36.9 C) 98.9 F (37.2 C)  TempSrc:  Oral Oral Oral  SpO2: 94% 96% 98% 100%  Weight:      Height:       No intake or output data in the 24 hours ending 11/09/19 1539 Filed Weights   11/09/19 0554  Weight: 109.8 kg    Exam:  General: NAD   Cardiovascular: S1, S2 present  Respiratory: CTAB  Abdomen: Soft, nontender, nondistended, bowel sounds present  Musculoskeletal: Trace edema noted around  the right foot  Skin:  Deep ulceration on the lateral aspect of the right foot with some foul-smelling drainage, erythema and swelling  Psychiatry: Normal mood   Data Reviewed: CBC: Recent Labs  Lab 11/08/19 1909 11/09/19 1054 11/09/19 1231  WBC 10.4 11.9*  --   NEUTROABS 6.1  --   --   HGB 9.8* 6.6* 10.6*  HCT 33.0* 22.0*  35.8*  MCV 83.1 84.3  --   PLT 441* 490*  --    Basic Metabolic Panel: Recent Labs  Lab 11/08/19 1909 11/09/19 1054  NA 139 139  K 4.2 4.3  CL 101 103  CO2 26 25  GLUCOSE 134* 176*  BUN 18 16  CREATININE 0.88 0.83  CALCIUM 9.2 8.8*   GFR: Estimated Creatinine Clearance: 92.4 mL/min (by C-G formula based on SCr of 0.83 mg/dL). Liver Function Tests: Recent Labs  Lab 11/08/19 1909  AST 10*  ALT 10  ALKPHOS 89  BILITOT 0.3  PROT 7.5  ALBUMIN 2.8*   No results for input(s): LIPASE, AMYLASE in the last 168 hours. No results for input(s): AMMONIA in the last 168 hours. Coagulation Profile: Recent Labs  Lab 11/09/19 1054  INR 1.0   Cardiac Enzymes: No results for input(s): CKTOTAL, CKMB, CKMBINDEX, TROPONINI in the last 168 hours. BNP (last 3 results) No results for input(s): PROBNP in the last 8760 hours. HbA1C: Recent Labs    11/09/19 1054  HGBA1C 9.3*   CBG: Recent Labs  Lab 11/09/19 0939 11/09/19 1151 11/09/19 1406  GLUCAP 184* 116* 127*   Lipid Profile: No results for input(s): CHOL, HDL, LDLCALC, TRIG, CHOLHDL, LDLDIRECT in the last 72 hours. Thyroid Function Tests: No results for input(s): TSH, T4TOTAL, FREET4, T3FREE, THYROIDAB in the last 72 hours. Anemia Panel: Recent Labs    11/09/19 1231  VITAMINB12 254  FOLATE 24.0  FERRITIN 118  TIBC 342  IRON 26*   Urine analysis:    Component Value Date/Time   COLORURINE YELLOW 11/09/2019 0554   APPEARANCEUR CLEAR 11/09/2019 0554   LABSPEC 1.012 11/09/2019 0554   PHURINE 5.0 11/09/2019 0554   GLUCOSEU >=500 (A) 11/09/2019 0554   HGBUR NEGATIVE 11/09/2019 0554   BILIRUBINUR NEGATIVE 11/09/2019 0554   KETONESUR 5 (A) 11/09/2019 0554   PROTEINUR 30 (A) 11/09/2019 0554   NITRITE NEGATIVE 11/09/2019 0554   LEUKOCYTESUR NEGATIVE 11/09/2019 0554   Sepsis Labs: @LABRCNTIP (procalcitonin:4,lacticidven:4)  ) Recent Results (from the past 240 hour(s))  SARS CORONAVIRUS 2 (TAT 6-24 HRS)  Nasopharyngeal Nasopharyngeal Swab     Status: None   Collection Time: 11/09/19  5:20 AM   Specimen: Nasopharyngeal Swab  Result Value Ref Range Status   SARS Coronavirus 2 NEGATIVE NEGATIVE Final    Comment: (NOTE) SARS-CoV-2 target nucleic acids are NOT DETECTED. The SARS-CoV-2 RNA is generally detectable in upper and lower respiratory specimens during the acute phase of infection. Negative results do not preclude SARS-CoV-2 infection, do not rule out co-infections with other pathogens, and should not be used as the sole basis for treatment or other patient management decisions. Negative results must be combined with clinical observations, patient history, and epidemiological information. The expected result is Negative. Fact Sheet for Patients: 11/11/19 Fact Sheet for Healthcare Providers: HairSlick.no This test is not yet approved or cleared by the quierodirigir.com FDA and  has been authorized for detection and/or diagnosis of SARS-CoV-2 by FDA under an Emergency Use Authorization (EUA). This EUA will remain  in effect (meaning this test can be used) for the  duration of the COVID-19 declaration under Section 56 4(b)(1) of the Act, 21 U.S.C. section 360bbb-3(b)(1), unless the authorization is terminated or revoked sooner. Performed at [facility], [place] [address], [place], [place] [address]   MRSA PCR Screening     Status: None   Collection Time: 11/09/19 10:09 AM   Specimen: Nasal Mucosa; Nasopharyngeal  Result Value Ref Range Status   MRSA by PCR NEGATIVE NEGATIVE Final    Comment:        The GeneXpert MRSA Assay (FDA approved for NASAL specimens only), is one component of a comprehensive MRSA colonization surveillance program. It is not intended to diagnose MRSA infection nor to guide or monitor treatment for MRSA infections. Performed at [facility], [place] [address], [place], [place] [address]        Studies: DG Foot Complete Right  Result Date: 11/08/2019 Please see detailed radiograph report in office note.   Scheduled Meds: . [MAR Hold] sodium chloride   Intravenous Once  . Chlorhexidine Gluconate Cloth  6 each Topical Once   And  . Chlorhexidine Gluconate Cloth  6 each Topical Once  . [MAR Hold] enoxaparin (LOVENOX) injection  40 mg Subcutaneous Q24H  . [MAR Hold] fluticasone  2 spray Each Nare Daily  . [MAR Hold] gabapentin  600 mg Oral TID  . [MAR Hold] insulin aspart  0-20 Units Subcutaneous Q6H  . [MAR Hold] insulin glargine  60 Units Subcutaneous QHS  . [MAR Hold] oxybutynin  5 mg Oral BID  . [MAR Hold] pantoprazole  40 mg Oral Daily  . [MAR Hold] ramipril  20 mg Oral Daily  . [MAR Hold] simvastatin  40 mg Oral QHS  . [MAR Hold] sodium chloride flush  3 mL Intravenous Once    Continuous Infusions: . 0.9 % NaCl with KCl 20 mEq / L 75 mL/hr at 11/09/19 0843  . [MAR Hold] cefTRIAXone (ROCEPHIN)  IV 2 g (11/09/19 1058)  . lactated ringers 10 mL/hr at 11/09/19 1520  . [MAR Hold] vancomycin       LOS: 0 days     Alma Friendly, MD [facility]  If 7PM-7AM, please contact night-coverage [email or web address] 11/09/2019, 3:39 PM

## 2019-11-09 NOTE — Anesthesia Preprocedure Evaluation (Signed)
Anesthesia Evaluation  Patient identified by MRN, date of birth, ID band Patient awake    Reviewed: Allergy & Precautions, H&P , NPO status , Patient's Chart, lab work & pertinent test results  Airway Mallampati: II   Neck ROM: full    Dental   Pulmonary former smoker,    breath sounds clear to auscultation       Cardiovascular hypertension,  Rhythm:regular Rate:Normal     Neuro/Psych  Neuromuscular disease CVA    GI/Hepatic   Endo/Other  diabetes, Type 2Morbid obesity  Renal/GU      Musculoskeletal osteomyelitis   Abdominal   Peds  Hematology   Anesthesia Other Findings   Reproductive/Obstetrics                             Anesthesia Physical Anesthesia Plan  ASA: III  Anesthesia Plan: MAC   Post-op Pain Management:    Induction: Intravenous  PONV Risk Score and Plan: 2 and Propofol infusion, Ondansetron, Treatment may vary due to age or medical condition and Midazolam  Airway Management Planned: Simple Face Mask  Additional Equipment:   Intra-op Plan:   Post-operative Plan:   Informed Consent: I have reviewed the patients History and Physical, chart, labs and discussed the procedure including the risks, benefits and alternatives for the proposed anesthesia with the patient or authorized representative who has indicated his/her understanding and acceptance.       Plan Discussed with: CRNA, Anesthesiologist and Surgeon  Anesthesia Plan Comments:         Anesthesia Quick Evaluation

## 2019-11-09 NOTE — Transfer of Care (Signed)
Immediate Anesthesia Transfer of Care Note  Patient: Melanie Spencer  Procedure(s) Performed: 5TH AMPUTATION RAY (Right )  Patient Location: PACU  Anesthesia Type:MAC and Regional  Level of Consciousness: awake and oriented  Airway & Oxygen Therapy: Patient Spontanous Breathing  Post-op Assessment: Report given to RN  Post vital signs: Reviewed and stable  Last Vitals:  Vitals Value Taken Time  BP 126/65 11/09/19 1624  Temp    Pulse 70 11/09/19 1625  Resp 13 11/09/19 1625  SpO2 99 % 11/09/19 1625  Vitals shown include unvalidated device data.  Last Pain:  Vitals:   11/09/19 1300  TempSrc: Oral  PainSc:          Complications: No apparent anesthesia complications

## 2019-11-09 NOTE — Progress Notes (Addendum)
Patient see in pre-op. Again reviewed the surgery and postop course. She had no further questions or concerns. NPO since 6:30am. Consent form signed.    Of note she did not have a blood transfusion today. Hg dropped but repeat showed false reading.

## 2019-11-09 NOTE — TOC Initial Note (Addendum)
Transition of Care [facility]) - Initial/Assessment Note    Patient Details  Name: Melanie Spencer MRN: [id number] Date of Birth: 06-13-1961  Transition of Care [facility]) CM/SW Contact:    Kingsley Plan, RN Phone Number: 11/09/2019, 12:55 PM  Clinical Narrative:                 Spoke to patient at bedside. Confirmed face sheet information. Patient from home with husband. Patient has bedside commode and cane already at home.   Husband assists her at home and has already been helping with wound care. Spoke to Dr Germaine Pomfret patient will need [facility], dressing changes wet to dry, Monday, Wednesday and Friday. Patient understands that home health RN will not make visits three times a week and her and her husband will be taught dressing changes prior to discharge.   Provided Medicare.gov list. Patient would like [facility], if they are not able to accept referral she has no preference. Spoke with Cassie with [facility] , she is not in network with patient's insurance. Called Tiffany with [facility] , awaiting call back.   Tiffany with [facility] accepted referral. Patient and husband aware.  Expected Discharge Plan: Home w Home Health Services Barriers to Discharge: Continued Medical Work up   Patient Goals and CMS Choice Patient states their goals for this hospitalization and ongoing recovery are:: to return home CMS Medicare.gov Compare Post Acute Care list provided to:: Patient Choice offered to / list presented to : Patient  Expected Discharge Plan and Services Expected Discharge Plan: Home w Home Health Services   Discharge Planning Services: CM Consult Post Acute Care Choice: Home Health Living arrangements for the past 2 months: Single Family Home                 DME Arranged: N/A DME Agency: NA       HH Arranged: PT, RN          Prior Living Arrangements/Services Living arrangements for the past 2 months: Single Family Home Lives with:: Spouse Patient  language and need for interpreter reviewed:: Yes Do you feel safe going back to the place where you live?: Yes          Current home services: DME Criminal Activity/Legal Involvement Pertinent to Current Situation/Hospitalization: No - Comment as needed  Activities of Daily Living      Permission Sought/Granted   Permission granted to share information with : No              Emotional Assessment Appearance:: Appears stated age Attitude/Demeanor/Rapport: Engaged Affect (typically observed): Accepting Orientation: : Oriented to Situation, Oriented to  Time, Oriented to Place, Oriented to Self Alcohol / Substance Use: Not Applicable Psych Involvement: No (comment)  Admission diagnosis:  Acute osteomyelitis of right foot (HCC) [M86.171] Other acute osteomyelitis of right foot [facility]) [M86.171] Patient Active Problem List   Diagnosis Date Noted  . Mixed hyperlipidemia due to type 2 diabetes mellitus (HCC) 11/09/2019  . Uncontrolled type 2 diabetes mellitus with diabetic polyneuropathy, with long-term current use of insulin (HCC) 11/09/2019  . Acute osteomyelitis of right foot (HCC) 11/09/2019  . Urinary incontinence 08/29/2019  . Essential hypertension, benign 05/23/2019  . Morbid obesity (HCC) 05/23/2019  . Cerebrovascular disease 05/23/2019  . Stroke [facility])    PCP:  [name], MD Pharmacy:   [facility] [phone] - [place], [place] - [address] AT [facility] [address] [place]   [address] Phone: [phone] Fax: [phone]  [place], [place] [address] Phone: [phone] Fax: [phone]     Social Determinants of Health (SDOH) Interventions    Readmission Risk Interventions No flowsheet data found.

## 2019-11-09 NOTE — Consult Note (Signed)
Reason for Consult: Osteomyelitis Referring Physician: Dr. Inda Merlin, MD  Melanie Spencer is an 59 y.o. female.  HPI: 59 year old female was sent to the emergency room from clinic yesterday for worsening infection to the right foot.  She had a chronic wound of the right foot over the last 8 months but recently she was on vacation at the beach and shortly afterwards she noticed increased swelling, drainage coming from the wound.  She presented to the office for follow-up and found to have osteomyelitis and cellulitis.  Due to this she was sent to the hospital for further evaluation and treatment.  I discussed with her partial fifth reputation in the office yesterday and we will proceed with that today.  Past Medical History:  Diagnosis Date  . Cerebrovascular disease 05/23/2019  . Essential hypertension, benign 05/23/2019  . History of stroke 2007   left leg weakness  . HLD (hyperlipidemia) 05/23/2019  . Morbid obesity ([place]) 05/23/2019  . Type II diabetes mellitus, uncontrolled ([place]) 05/23/2019  . Vitamin D deficiency     Past Surgical History:  Procedure Laterality Date  . CHOLECYSTECTOMY      Family History  Problem Relation Age of Onset  . Heart disease Mother   . Diabetes Mother   . Hypertension Mother   . Cancer Father        Lung  . Hypertension Sister   . Macular degeneration Sister   . Appendicitis Brother   . Hypertension Sister   . Diabetes Sister     Social History:  reports that she quit smoking about 14 years ago. Her smoking use included cigarettes. She has a 40.00 pack-year smoking history. She has never used smokeless tobacco. She reports that she does not drink alcohol or use drugs.  Allergies: No Known Allergies  Medications: I have reviewed the patient's current medications.  Results for orders placed or performed during the hospital encounter of 11/08/19 (from the past 48 hour(s))  Lactic acid, plasma     Status: None   Collection Time: 11/08/19   7:09 PM  Result Value Ref Range   Lactic Acid, Venous 1.1 0.5 - 1.9 mmol/L    Comment: Performed at [facility], [place] [address], [place], [place] [address]  Comprehensive metabolic panel     Status: Abnormal   Collection Time: 11/08/19  7:09 PM  Result Value Ref Range   Sodium 139 135 - 145 mmol/L   Potassium 4.2 3.5 - 5.1 mmol/L   Chloride 101 98 - 111 mmol/L   CO2 26 22 - 32 mmol/L   Glucose, Bld 134 (H) 70 - 99 mg/dL    Comment: Glucose reference range applies only to samples taken after fasting for at least 8 hours.   BUN 18 6 - 20 mg/dL   Creatinine, Ser 0.88 0.44 - 1.00 mg/dL   Calcium 9.2 8.9 - 10.3 mg/dL   Total Protein 7.5 6.5 - 8.1 g/dL   Albumin 2.8 (L) 3.5 - 5.0 g/dL   AST 10 (L) 15 - 41 U/L   ALT 10 0 - 44 U/L   Alkaline Phosphatase 89 38 - 126 U/L   Total Bilirubin 0.3 0.3 - 1.2 mg/dL   GFR calc non Af Amer >60 >60 mL/min   GFR calc Af Amer >60 >60 mL/min   Anion gap 12 5 - 15    Comment: Performed at [place] [address], [place], [place] [address]  CBC with Differential  Status: Abnormal   Collection Time: 11/08/19  7:09 PM  Result Value Ref Range   WBC 10.4 4.0 - 10.5 K/uL   RBC 3.97 3.87 - 5.11 MIL/uL   Hemoglobin 9.8 (L) 12.0 - 15.0 g/dL   HCT 29.5 (L) 18.8 - 41.6 %   MCV 83.1 80.0 - 100.0 fL   MCH 24.7 (L) 26.0 - 34.0 pg   MCHC 29.7 (L) 30.0 - 36.0 g/dL   RDW 60.6 30.1 - 60.1 %   Platelets 441 (H) 150 - 400 K/uL   nRBC 0.0 0.0 - 0.2 %   Neutrophils Relative % 58 %   Neutro Abs 6.1 1.7 - 7.7 K/uL   Lymphocytes Relative 31 %   Lymphs Abs 3.2 0.7 - 4.0 K/uL   Monocytes Relative 6 %   Monocytes Absolute 0.6 0.1 - 1.0 K/uL   Eosinophils Relative 3 %   Eosinophils Absolute 0.3 0.0 - 0.5 K/uL   Basophils Relative 1 %   Basophils Absolute 0.1 0.0 - 0.1 K/uL   Immature Granulocytes 1 %   Abs Immature Granulocytes 0.07 0.00 - 0.07 K/uL    Comment: Performed at [facility], [address] [place], [place] [address]   Urinalysis, Routine w reflex microscopic     Status: Abnormal   Collection Time: 11/09/19  5:54 AM  Result Value Ref Range   Color, Urine YELLOW YELLOW   APPearance CLEAR CLEAR   Specific Gravity, Urine 1.012 1.005 - 1.030   pH 5.0 5.0 - 8.0   Glucose, UA >=500 (A) NEGATIVE mg/dL   Hgb urine dipstick NEGATIVE NEGATIVE   Bilirubin Urine NEGATIVE NEGATIVE   Ketones, ur 5 (A) NEGATIVE mg/dL   Protein, ur 30 (A) NEGATIVE mg/dL   Nitrite NEGATIVE NEGATIVE   Leukocytes,Ua NEGATIVE NEGATIVE   RBC / HPF 0-5 0 - 5 RBC/hpf   WBC, UA 0-5 0 - 5 WBC/hpf   Bacteria, UA NONE SEEN NONE SEEN   Squamous Epithelial / LPF 0-5 0 - 5    Comment: Performed at [facility], [address], [place], [place] [address]    DG Foot Complete Right  Result Date: 11/08/2019 Please see detailed radiograph report in office note.   Review of Systems Blood pressure (!) 146/92, pulse 78, temperature 98.2 F (36.8 C), temperature source Oral, resp. rate 16, height 5' 6.5" (1.689 m), weight 109.8 kg, last menstrual period 03/02/2012, SpO2 99 %. Physical Exam AAO x 3, NAD-seen in the emergency department DP pulse palpable.  Swelling present to the ankles and legs.  She states this worsened overnight as she did not get he had her feet elevated as she was in the emergency department. Wound present the lateral aspect of the foot which probes to bone and there is mild surrounding erythema.  Mild warmth directly around the wound.  There is purulent drainage.  There is no fluctuation or crepitation. Mild swelling to the leg but there is no pain with calf compression.  Assessment/Plan: Osteomyelitis right 5th ray-controlled diabetes  X-ray calcaneus during the office which revealed osteomyelitis of fifth metatarsal head as well as phalanx.  Due to this as well as osteomyelitis she was admitted to the hospital and she has been started on IV antibiotics.  Blood cultures are pending.  Labs reassuring.  White blood cell  count 10.4, creatinine 0.88.  Previous A1c was 9.4 in the emergency is pending.  We discussed surgical versus conservative limb salvage.  After discussion we will proceed  with surgery.  We will plan for partial fifth ray amputation this afternoon.  She is currently n.p.o.  We discussed the surgery as well as the postoperative course and consent with her and her husband who is at bedside.  We discussed risks of surgery including, but not limited to, spread of infection, need for further surgery, delayed or nonhealing in general risks of surgery including stroke, heart attack, death.  Melanie Spencer 2019/12/05, 7:36 AM  O: [phone] C: [phone]

## 2019-11-10 LAB — COMPREHENSIVE METABOLIC PANEL
ALT: 8 U/L (ref 0–44)
AST: 9 U/L — ABNORMAL LOW (ref 15–41)
Albumin: 2.4 g/dL — ABNORMAL LOW (ref 3.5–5.0)
Alkaline Phosphatase: 76 U/L (ref 38–126)
Anion gap: 8 (ref 5–15)
BUN: 12 mg/dL (ref 6–20)
CO2: 24 mmol/L (ref 22–32)
Calcium: 8.7 mg/dL — ABNORMAL LOW (ref 8.9–10.3)
Chloride: 109 mmol/L (ref 98–111)
Creatinine, Ser: 0.84 mg/dL (ref 0.44–1.00)
GFR calc Af Amer: >60 mL/min (ref >60)
GFR calc non Af Amer: >60 mL/min (ref >60)
Glucose, Bld: 137 mg/dL — ABNORMAL HIGH (ref 70–99)
Potassium: 4.1 mmol/L (ref 3.5–5.1)
Sodium: 141 mmol/L (ref 135–145)
Total Bilirubin: 0.4 mg/dL (ref 0.3–1.2)
Total Protein: 6.6 g/dL (ref 6.5–8.1)

## 2019-11-10 LAB — CBC WITH DIFFERENTIAL/PLATELET
Abs Immature Granulocytes: 0.06 10*3/uL (ref 0.00–0.07)
Basophils Absolute: 0.1 10*3/uL (ref 0.0–0.1)
Basophils Relative: 1 %
Eosinophils Absolute: 0.2 10*3/uL (ref 0.0–0.5)
Eosinophils Relative: 3 %
HCT: 29.3 % — ABNORMAL LOW (ref 36.0–46.0)
Hemoglobin: 9 g/dL — ABNORMAL LOW (ref 12.0–15.0)
Immature Granulocytes: 1 %
Lymphocytes Relative: 27 %
Lymphs Abs: 2.3 10*3/uL (ref 0.7–4.0)
MCH: 25.3 pg — ABNORMAL LOW (ref 26.0–34.0)
MCHC: 30.7 g/dL (ref 30.0–36.0)
MCV: 82.3 fL (ref 80.0–100.0)
Monocytes Absolute: 0.5 10*3/uL (ref 0.1–1.0)
Monocytes Relative: 6 %
Neutro Abs: 5.5 10*3/uL (ref 1.7–7.7)
Neutrophils Relative %: 62 %
Platelets: 405 10*3/uL — ABNORMAL HIGH (ref 150–400)
RBC: 3.56 MIL/uL — ABNORMAL LOW (ref 3.87–5.11)
RDW: 14 % (ref 11.5–15.5)
WBC: 8.6 10*3/uL (ref 4.0–10.5)
nRBC: 0 % (ref 0.0–0.2)

## 2019-11-10 LAB — MAGNESIUM: Magnesium: 1.6 mg/dL — ABNORMAL LOW (ref 1.7–2.4)

## 2019-11-10 LAB — GLUCOSE, CAPILLARY
Glucose-Capillary: 181 mg/dL — ABNORMAL HIGH (ref 70–99)
Glucose-Capillary: 126 mg/dL — ABNORMAL HIGH (ref 70–99)
Glucose-Capillary: 165 mg/dL — ABNORMAL HIGH (ref 70–99)
Glucose-Capillary: 217 mg/dL — ABNORMAL HIGH (ref 70–99)

## 2019-11-10 MED ORDER — DOXYCYCLINE HYCLATE 100 MG PO CAPS: 100.0000 mg | ORAL_CAPSULE | Freq: Two times a day (BID) | ORAL | 0 refills | Status: AC

## 2019-11-10 MED ORDER — SODIUM CHLORIDE 0.9 % IV SOLN
510.0000 mg | Freq: Once | INTRAVENOUS | Status: AC
  Administered 2019-11-10: 510 mg via INTRAVENOUS
  Filled 2019-11-10: qty 17

## 2019-11-10 MED ORDER — MAGNESIUM SULFATE 2 GM/50ML IV SOLN
2.0000 g | Freq: Once | INTRAVENOUS | Status: AC
  Administered 2019-11-10: 2 g via INTRAVENOUS
  Filled 2019-11-10: qty 50

## 2019-11-10 MED ORDER — FERROUS SULFATE 325 (65 FE) MG PO TABS: 325.0000 mg | ORAL_TABLET | Freq: Every day | ORAL | 0 refills | Status: DC

## 2019-11-10 NOTE — Plan of Care (Signed)

## 2019-11-10 NOTE — Care Management (Signed)
Recommendation received from OT for hemiwalker.  [facility] is not able to provide at this time.  Spoke with patient's husband. Order printed to be given to patient.  Husband advised to take to [facility] 9am-11am tomorrow morning to obtain hemiwalker. Husband agrees.

## 2019-11-10 NOTE — Evaluation (Signed)
Occupational Therapy Evaluation Patient Details Name: Melanie Spencer MRN: [id number] DOB: [birth year] Today's Date: 11/10/2019    History of Present Illness 59 yo admitted with right foot osteomyelitis s/p 5th partial ray amputation. PMhx: DM, HLD, HTN, CVA   Clinical Impression   Patient evaluated by Occupational Therapy with no further acute OT needs identified. All education has been completed and the patient has no further questions. Education completed with spouse.  Pt requires min A to ambulate with hemi walker while maintaining WBing precautions.  As she fatigues, she is unable to keep weight on heel.  See below for details.  See below for any follow-up Occupational Therapy or equipment needs. OT is signing off. Thank you for this referral.      Follow Up Recommendations  Home health OT;Supervision - Intermittent(as long as pt utilizes w/c for mobility )    Equipment Recommendations  Other (comment)(hemi walker )    Recommendations for Other Services       Precautions / Restrictions Precautions Required Braces or Orthoses: Other Brace Other Brace: post op shoe, pt has LLE AFO at home Restrictions Weight Bearing Restrictions: Yes RLE Weight Bearing: Partial weight bearing RLE Partial Weight Bearing Percentage or Pounds: heel weight bearing with post op shoe      Mobility Bed Mobility Overal bed mobility: Needs Assistance Bed Mobility: Supine to Sit     Supine to sit: HOB elevated;Min assist     General bed mobility comments: Pt at EOB   Transfers Overall transfer level: Needs assistance Equipment used: 1 person hand held assist;Hemi-walker Transfers: Sit to/from [employer] Sit to Stand: Min assist Stand pivot transfers: Min assist       General transfer comment: verbal cues for safety and to keep weight on heel only.  Pt requires min A for transfers as she fatigues     Balance Overall balance assessment: Needs assistance Sitting-balance  support: Feet supported Sitting balance-Leahy Scale: Good     Standing balance support: Single extremity supported Standing balance-Leahy Scale: Poor Standing balance comment: unable to maintain static standing while maintaining WBing through heel without UE support                            ADL either performed or assessed with clinical judgement   ADL Overall ADL's : Needs assistance/impaired Eating/Feeding: Modified independent;Sitting   Grooming: Wash/dry hands;Wash/dry face;Oral care;Brushing hair;Set up;Sitting Grooming Details (indicate cue type and reason): Pt ambulated from commode to sink to wash hands with spouse assisting, upon OT's entrance.  She was unable to maintain WBing precuations and required min A to steady  Upper Body Bathing: Set up;Sitting   Lower Body Bathing: Minimal assistance;Sit to/from stand   Upper Body Dressing : Set up;Sitting   Lower Body Dressing: Minimal assistance;Sit to/from stand Lower Body Dressing Details (indicate cue type and reason): assist to safely pull pants over hips  Toilet Transfer: Minimal assistance;Ambulation;BSC;Comfort height toilet(hemi walker; min guard assist stand or squat pivot )   Toileting- Clothing Manipulation and Hygiene: Minimal assistance;Sit to/from stand Toileting - Clothing Manipulation Details (indicate cue type and reason): assist for clothing manipulation.  Recommended that pt wear a house dress or gown at home without underpants to improve ease and safety of toileting tasks      Functional mobility during ADLs: Minimal assistance General ADL Comments: Long discussion with pt and spouse re: safety at home.  spouse works from [employer].  Mapped out their  day with them.  Reiterated that pt is not to attempt ambulation without spouse assisting her and using the hemi walker.  Use of BSC for toileting needs; place cooler with her lunch and snacks next to her; wear house dress or gown, and no underpants; Rt UE  must be bagged to prevent it from getting wet when showering - otherwise sponge bathe.  Shower only when spouse is present and assisting; use w/c for mobility unless she has to access an area the w/c won't fit       Vision         Perception     Praxis      Pertinent Vitals/Pain Pain Assessment: No/denies pain     Hand Dominance Right   Extremity/Trunk Assessment Upper Extremity Assessment Upper Extremity Assessment: LUE deficits/detail LUE Deficits / Details: Pt with residual deficits from CVA.  She does not attempt to use Rt UE functionally.  Rt hand in fisted position, elbow flexed, shoulder internally rotated    Lower Extremity Assessment Lower Extremity Assessment: Defer to PT evaluation LLE Deficits / Details: hemiparetic at baseline with grossly 2/5 strength   Cervical / Trunk Assessment Cervical / Trunk Assessment: Kyphotic;Other exceptions Cervical / Trunk Exceptions: Decreased isolated movements of trunk    Communication Communication Communication: No difficulties   Cognition Arousal/Alertness: Awake/alert Behavior During Therapy: WFL for tasks assessed/performed Overall Cognitive Status: History of cognitive impairments - at baseline                                 General Comments: Pt with decreased carry over of info.  Discussed need to use w/c in the home, and avoid walking due to difficulty maintaining WBing precautions.  When problem solving through meal solutions, pt stated "well, I will walk into the kitchen and microwave something"    General Comments       Exercises     Shoulder Instructions      Home Living Family/patient expects to be discharged to:: Private residence Living Arrangements: Spouse/significant other Available Help at Discharge: Available PRN/intermittently Type of Home: House Home Access: Stairs to enter Entrance Stairs-Number of Steps: 4 Entrance Stairs-Rails: Right;Left Home Layout: One level     Bathroom  Shower/Tub: Producer, television/film/video: Standard     Home Equipment: Cane - single point;Bedside commode   Additional Comments: They report they lent her tub transfer bench to family members, but spouse is going to see if they can locate it.  They report they can borrow a w/c from their church       Prior Functioning/Environment Level of Independence: Independent        Comments: pt reports she was mod I with ADLs and IADLs PTA         OT Problem List: Decreased range of motion;Decreased strength;Decreased activity tolerance;Impaired balance (sitting and/or standing);Decreased coordination;Decreased cognition;Decreased safety awareness;Decreased knowledge of use of DME or AE;Decreased knowledge of precautions;Obesity;Impaired UE functional use      OT Treatment/Interventions:      OT Goals(Current goals can be found in the care plan section) Acute Rehab OT Goals Patient Stated Goal: to go home and get back to normal  OT Goal Formulation: All assessment and education complete, DC therapy  OT Frequency:     Barriers to D/C:            Co-evaluation  AM-PAC OT "6 Clicks" Daily Activity     Outcome Measure Help from another person eating meals?: None Help from another person taking care of personal grooming?: A Little Help from another person toileting, which includes using toliet, bedpan, or urinal?: A Little Help from another person bathing (including washing, rinsing, drying)?: A Little Help from another person to put on and taking off regular upper body clothing?: A Little Help from another person to put on and taking off regular lower body clothing?: A Little 6 Click Score: 19   End of Session Equipment Utilized During Treatment: Gait belt;Other (comment)(hemi walker ) Nurse Communication: Mobility status  Activity Tolerance: Patient tolerated treatment well Patient left: in bed;with call bell/phone within reach;with family/visitor  present(EOB )  OT Visit Diagnosis: Unsteadiness on feet (R26.81);Muscle weakness (generalized) (M62.81);Hemiplegia and hemiparesis Hemiplegia - Right/Left: Left Hemiplegia - dominant/non-dominant: Non-Dominant Hemiplegia - caused by: Cerebral infarction                Time: 5366-4403 OT Time Calculation (min): 43 min Charges:  OT General Charges $OT Visit: 1 Visit OT Evaluation $OT Eval Moderate Complexity: 1 Mod OT Treatments $Self Care/Home Management : 23-37 mins  Nilsa Nutting., OTR/L Acute Rehabilitation Services Pager [phone] Office [phone]   Lucille Passy M 11/10/2019, 4:21 PM

## 2019-11-10 NOTE — Evaluation (Signed)
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 PMhx: DM, HLD, HTN, CVA  Clinical Impression  Pt very pleasant and eager to return home. Pt educated for orders for heel weightbearing in post op shoe. Pt states she normally has a brace for LLE and that spouse assists with OOB and at times mobility in home. Pt with decreased activity tolerance, gait and functional mobility who will benefit from acute therapy to maximize independence and function adhering to precautions. Spouse reports he can borrow WC from church and that he can assist with cooking and housework.   Pt unable to maintain heel weight bearing and discussed maintaining use of post op shoe RLE along with brace and shoe on LLE at all times. Use of BSC during day in living room with pt only walking from bed to living room 1x per day or using WC once obtained to maintain weight off foot for 1-2 weeks for ultimate healing. Pt and spouse aware and agreeable to this plan and need for elevation at rest.     Follow Up Recommendations Home health PT    Equipment Recommendations  Other (comment)(hemiwalker)    Recommendations for Other Services OT consult     Precautions / Restrictions Precautions Required Braces or Orthoses: Other Brace Other Brace: post op shoe, pt has LLE AFO at home Restrictions Weight Bearing Restrictions: Yes RLE Weight Bearing: Partial weight bearing RLE Partial Weight Bearing Percentage or Pounds: heel weight bearing with post op shoe      Mobility  Bed Mobility Overal bed mobility: Needs Assistance Bed Mobility: Supine to Sit     Supine to sit: HOB elevated;Min assist     General bed mobility comments: HOB 20 degrees with physical assist to lift trunk from surface with RUE with pt reporting spouse normally assists OOB in  am  Transfers Overall transfer level: Needs assistance   Transfers: Sit to/from Stand Sit to Stand: Min guard         General transfer comment: cues for hand placement and weight on heel. Pt unable to maintain heel weight bearing with sit to stand transition  Ambulation/Gait Ambulation/Gait assistance: Min assist Gait Distance (Feet): 10 Feet Assistive device: 1 person hand held assist Gait Pattern/deviations: Step-to pattern   Gait velocity interpretation: <1.8 ft/sec, indicate of risk for recurrent falls General Gait Details: pt with hemiparetic gait with decreased stance and swing of LLE. Pt unable to sufficiently keep weight on right heel particularly with post op shoe, HHA and no show or AFO present for LLE.  Stairs            Wheelchair Mobility    Modified Rankin (Stroke Patients Only)       Balance Overall balance assessment: Needs assistance   Sitting balance-Leahy Scale: Good       Standing balance-Leahy Scale: Fair                               Pertinent Vitals/Pain Pain Assessment: No/denies pain    Home Living Family/patient expects to be discharged to:: Private residence Living Arrangements: Spouse/significant other Available Help at Discharge: Available PRN/intermittently Type of Home: House Home Access: Stairs to enter Entrance Stairs-Rails: Psychiatric nurse of Steps: 4 Home Layout: One level Home Equipment: Cane - single point;Bedside commode  Prior Function                 Hand Dominance        Extremity/Trunk Assessment   Upper Extremity Assessment Upper Extremity Assessment: LUE deficits/detail LUE Deficits / Details: hemiparetic with no intrinsic function and grossly 2-/5 shoulder and elbow motion    Lower Extremity Assessment Lower Extremity Assessment: LLE deficits/detail LLE Deficits / Details: hemiparetic at baseline with grossly 2/5 strength    Cervical / Trunk  Assessment Cervical / Trunk Assessment: Kyphotic  Communication   Communication: No difficulties  Cognition Arousal/Alertness: Awake/alert Behavior During Therapy: WFL for tasks assessed/performed Overall Cognitive Status: Within Functional Limits for tasks assessed                                        General Comments      Exercises     Assessment/Plan    PT Assessment Patient needs continued PT services  PT Problem List Decreased mobility;Decreased activity tolerance;Decreased balance;Decreased knowledge of use of DME       PT Treatment Interventions DME instruction;Therapeutic exercise;Gait training;Stair training;Functional mobility training;Therapeutic activities;Patient/family education    PT Goals (Current goals can be found in the Care Plan section)  Acute Rehab PT Goals Patient Stated Goal: return home PT Goal Formulation: With patient/family Time For Goal Achievement: 11/17/19 Potential to Achieve Goals: Good    Frequency Min 3X/week   Barriers to discharge Decreased caregiver support      Co-evaluation               AM-PAC PT "6 Clicks" Mobility  Outcome Measure Help needed turning from your back to your side while in a flat bed without using bedrails?: A Little Help needed moving from lying on your back to sitting on the side of a flat bed without using bedrails?: A Little Help needed moving to and from a bed to a chair (including a wheelchair)?: A Little Help needed standing up from a chair using your arms (e.g., wheelchair or bedside chair)?: A Little Help needed to walk in hospital room?: A Little Help needed climbing 3-5 steps with a railing? : A Lot 6 Click Score: 17    End of Session   Activity Tolerance: Patient tolerated treatment well Patient left: in chair;with call bell/phone within reach;with family/visitor present Nurse Communication: Mobility status;Weight bearing status PT Visit Diagnosis: Other abnormalities of  gait and mobility (R26.89);Difficulty in walking, not elsewhere classified (R26.2)    Time: 3734-2876 PT Time Calculation (min) (ACUTE ONLY): 26 min   Charges:   PT Evaluation $PT Eval Moderate Complexity: 1 Mod PT Treatments $Therapeutic Activity: 8-22 mins        Carlen Fils P, PT Acute Rehabilitation Services Pager: [phone] Office: [phone]   Malissia Rabbani B Coulton Schlink 11/10/2019, 1:15 PM

## 2019-11-10 NOTE — Op Note (Signed)
PATIENT:  Melanie Spencer  59 y.o. female  PRE-OPERATIVE DIAGNOSIS:  Osteomyelitis  POST-OPERATIVE DIAGNOSIS:  Osteomyelitis  PROCEDURE:  Procedure(s): 5TH AMPUTATION RAY (Right)  SURGEON:  Surgeon(s) and Role:    * Trula Slade, DPM - Primary  PHYSICIAN ASSISTANT:   ASSISTANTS: none   ANESTHESIA:   MAC  EBL:  25 cc   BLOOD ADMINISTERED:none  DRAINS: none   LOCAL MEDICATIONS USED:  OTHER 10 cc lidocaine and marcaine plain  SPECIMEN:  Source of Specimen:  wound culture   DISPOSITION OF SPECIMEN:  micro  COUNTS:  YES  TOURNIQUET:  * No tourniquets in log *  DICTATION: .Dragon Dictation  PLAN OF CARE: Admit to inpatient   PATIENT DISPOSITION:  PACU - hemodynamically stable.   Delay start of Pharmacological VTE agent (>24hrs) due to surgical blood loss or risk of bleeding: no  Indication for surgery: 59 year old female was seen in the office on Tuesday for worsening wound on her right foot.  She had a chronic wound for about 8 months has been stable however she recently went on vacation and noticed increased swelling and redness of the foot.  She was directed to emergency room where she was admitted and started IV antibiotics.  She has osteomyelitis at the fifth metatarsal head and proximal phalanx.  Discussed with her partial fifth reputation.  We discussed pros and cons of limb salvage versus amputation and she and her husband agree with amputation.  We discussed the surgeries as postoperative course we discussed all alternatives, risks, complications.  No promises or guarantees were given second of the procedure and all questions were into the best my ability.  Procedure in detail: The patient was both verbally and visually identified by myself and nursing staff and the anesthesia staff preoperatively.  She was then transferred to the operating room via stretcher and placed on the operating table in the supine position.  A well-padded calf tourniquet  was applied however this was not inflated during the procedure.  After an adequate plane of anesthesia mixture of 10 cc of lidocaine, Marcaine plain was infiltrated in a regional block fashion.  Appropriate timeout was performed.  A racquet shaped incision was planned along the fifth metatarsal extending around the fifth MPJ in order to also excise the ulceration.  The incision was made with a 15 blade scalpel through the skin to bone.  The fifth toe was then disarticulated.  Soft tissue structures were freed from the distal fifth metatarsal.  There appeared to be some necrotic tissue present along the fifth metatarsal head and the bone appeared to be soft and have osteomyelitis.  I utilized a sagittal bone saw in order to resect the metatarsal down to an appropriate level that I felt was healthy and viable.  At this time I debrided all nonviable tissue utilizing 15 with scalpel as well as a rongeur.  There is no purulence or signs of an abscess.  Wound culture was obtained.  There is no proximal lateral tracking.  The fourth metatarsal was covered.  The remaining fifth metatarsal appear to be viable and is hard in nature and white in color.  The incision was copiously irrigated with saline and hemostasis achieved.  The incision was then closed proximally with 3-0 Prolene and I loosely closed the distal aspect for retention sutures of 2-0 Prolene.  The wound was packed with saline wet-to-dry dressing followed by dry sterile dressing.  At this time she is awoken from anesthesia and found to tolerate  the procedure well any complications.  Transferred to PACU vital signs stable and vascular status intact remaining digits.  She is to remain inpatient for another 24 to 48 hours with IV antibiotics.  Partial weightbearing to heel and surgical shoe.  I have already contacted case management regards to home health dressing changes. I did order arterial studies elevation.  However she did have a palpable DP  pulse.  Celesta Gentile, DPM

## 2019-11-10 NOTE — Discharge Summary (Signed)
Discharge Summary  Melanie Spencer ERX:[id number] DOB: [birth year]  PCP: Doree Albee, MD  Admit date: 11/08/2019 Discharge date: 11/10/2019  Time spent: 40 mins  Recommendations for Outpatient Follow-up:  1. Podiatry within 1 week 2. PCP within 1 week  Discharge Diagnoses:  Active Hospital Problems   Diagnosis Date Noted  . Mixed hyperlipidemia due to type 2 diabetes mellitus ([place]) 11/09/2019  . Uncontrolled type 2 diabetes mellitus with diabetic polyneuropathy, with long-term current use of insulin ([place]) 11/09/2019  . Acute osteomyelitis of right foot ([place]) 11/09/2019  . Osteomyelitis ([place]) 11/09/2019  . Essential hypertension, benign 05/23/2019    Resolved Hospital Problems  No resolved problems to display.    Discharge Condition: Stable  Diet recommendation: Heart healthy/moderate carb  Vitals:   11/10/19 0647 11/10/19 1429  BP: 137/84 (!) 134/52  Pulse: 75 66  Resp: 16 18  Temp: 98.1 F (36.7 C) 98.3 F (36.8 C)  SpO2: 93% 92%    History of present illness:  59 year old female with past medical history of poorly controlled diabetes mellitus, hyperlipidemia,previous stroke, hypertension presents to ED at the direction of Dr.Wagoner due to concerns for right foot osteomyelitis. Patient reports that approximately 8 months ago, developed a wound over the lateral aspect of her right foot, has been followed with Dr. Earleen Newport, and has received various local wound care and topical antibiotic therapy with some mild improvement. Approximately 4 days ago while the patient went to the beach she began to experience foul smelling drainage and pain from the wound. Patient presented to her podiatrist office on 4/15 for further evaluation of which x-ray of the right foot was consistent with osteomyelitis of the fifth metatarsal. Pt admitted for further management.    Today, patient denies any new complaints.  Patient reports well-controlled postop pain.  Patient denies any  fever/chills, chest pain, abdominal pain, nausea/vomiting.  Patient eager to be discharged.  Stable to discharge from a podiatry standpoint to follow-up with Dr. Jacqualyn Posey in 1 week.  Home health PT/OT, RN has been ordered.    Hospital Course:  Active Problems:   Essential hypertension, benign   Mixed hyperlipidemia due to type 2 diabetes mellitus ([place])   Uncontrolled type 2 diabetes mellitus with diabetic polyneuropathy, with long-term current use of insulin ([facility])   Acute osteomyelitis of right foot ([place])   Osteomyelitis ([facility])   Osteomyelitis of the right foot/diabetic foot wound status post partial fifth ray amputation on 11/09/2019 by podiatry Dr. Jacqualyn Posey Currently afebrile, with resolved leukocytosis BC x2 no growth till date Deep wound culture, no growth till date X-ray showed osteomyelitis of the fifth metatarsal head as well as phalanx of the right foot S/P ceftriaxone, vancomycin, discussed with Dr. Posey Pronto who is covering for Dr. Jacqualyn Posey, recommend discharge on p.o. doxycycline for 14 days Follow-up with podiatry in 1 week  Diabetes mellitus type 2/neuropathy Last A1c 9.3, uncontrolled Continue home regimen Follow-up with PCP Continue gabapentin  Iron deficiency anemia Status post one dose of Feraheme on 11/10/2019 Start oral iron supplementation Follow-up with PCP for a repeat dose  Hypomagnesemia Repleted  Hypertension Continue home regimen, ramipril  Hyperlipidemia/history of CVA Continue statins, ASA 325 mg   GERD Continue PPI         Malnutrition Type:      Malnutrition Characteristics:      Nutrition Interventions:      Estimated body mass index is 38.47 kg/m as calculated from the following:   Height as of this encounter: 5' 6.5" (1.689 m).  Weight as of this encounter: 109.8 kg.    Procedures:  Partial fifth ray amputation by podiatry on 11/09/2019  Consultations:  Podiatry  Discharge Exam: BP (!) 134/52 (BP Location: Right  Arm)   Pulse 66   Temp 98.3 F (36.8 C) (Oral)   Resp 18   Ht 5' 6.5" (1.689 m)   Wt 109.8 kg   LMP 03/02/2012   SpO2 92%   BMI 38.47 kg/m   General: NAD Cardiovascular: S1, S2 present Respiratory: CTA B Extremities: Right foot wrapped with Ace wrap, C/D/I    Discharge Instructions You were cared for by a hospitalist during your hospital stay. If you have any questions about your discharge medications or the care you received while you were in the hospital after you are discharged, you can call the unit and asked to speak with the hospitalist on call if the hospitalist that took care of you is not available. Once you are discharged, your primary care physician will handle any further medical issues. Please note that NO REFILLS for any discharge medications will be authorized once you are discharged, as it is imperative that you return to your primary care physician (or establish a relationship with a primary care physician if you do not have one) for your aftercare needs so that they can reassess your need for medications and monitor your lab values.  Discharge Instructions    Diet - low sodium heart healthy   Complete by: As directed    Increase activity slowly   Complete by: As directed      Allergies as of 11/10/2019   No Known Allergies     Medication List    STOP taking these medications   doxycycline 100 MG tablet Commonly known as: VIBRA-TABS Replaced by: doxycycline 100 MG capsule     TAKE these medications   Accu-Chek Guide Me w/Device Kit   Accu-Chek Guide test strip Generic drug: glucose blood   aspirin 325 MG tablet Take 325 mg by mouth daily.   Blood Pressure Monitor Automat Devi Check blood pressure at home daily   cholecalciferol 1000 units tablet Commonly known as: VITAMIN D Take 1,000 Units by mouth daily.   doxycycline 100 MG capsule Commonly known as: VIBRAMYCIN Take 1 capsule (100 mg total) by mouth 2 (two) times daily for 14  days. Replaces: doxycycline 100 MG tablet   ferrous sulfate 325 (65 FE) MG tablet Commonly known as: FerrouSul Take 1 tablet (325 mg total) by mouth daily with breakfast.   fluticasone 50 MCG/ACT nasal spray Commonly known as: FLONASE SHAKE LIQUID AND USE 2 SPRAYS IN EACH NOSTRIL EVERY DAY What changed: See the new instructions.   gabapentin 300 MG capsule Commonly known as: NEURONTIN TAKE 2 CAPSULES BY MOUTH THREE TIMES DAILY   Lantus SoloStar 100 UNIT/ML Solostar Pen Generic drug: insulin glargine ADMINISTER 75 UNITS UNDER THE SKIN AT BEDTIME What changed:   how much to take  how to take this  when to take this  additional instructions   metFORMIN 1000 MG tablet Commonly known as: GLUCOPHAGE TAKE 1 TABLET BY MOUTH TWICE DAILY What changed: when to take this   omeprazole 20 MG capsule Commonly known as: PRILOSEC TAKE 1 CAPSULE BY MOUTH DAILY   oxybutynin 5 MG tablet Commonly known as: DITROPAN TAKE 1 TABLET(5 MG) BY MOUTH THREE TIMES DAILY What changed: See the new instructions.   ramipril 10 MG capsule Commonly known as: ALTACE Take 2 capsules (20 mg total) by mouth daily.     simvastatin 40 MG tablet Commonly known as: ZOCOR TAKE 1 TABLET BY MOUTH AT BEDTIME   Trulicity 0.75 MG/0.5ML Sopn Generic drug: Dulaglutide ADMINISTER 0.75 MG UNDER THE SKIN 1 TIME A WEEK What changed: See the new instructions.      No Known Allergies Follow-up Information    [facility] Follow up.   Specialty: Home Health Services Why: home health Contact information: [address] [place] [address] [phone]        Gosrani, Nimish C, MD. Schedule an appointment as soon as possible for a visit in 1 week(s).   Specialty: Internal Medicine Contact information: [address] [place] [address] [phone]            The results of significant diagnostics from this hospitalization (including imaging, microbiology, ancillary and laboratory)  are listed below for reference.    Significant Diagnostic Studies: DG Foot 2 Views Right  Result Date: 11/09/2019 CLINICAL DATA:  Postop. Right foot fifth digit amputation. EXAM: RIGHT FOOT - 2 VIEW COMPARISON:  Preoperative radiograph yesterday. FINDINGS: Interval transmetatarsal amputation of the fifth ray with expected soft tissue defect. Hammertoe deformity of the digits. Exam is otherwise unchanged from yesterday. IMPRESSION: Interval transmetatarsal amputation of the fifth ray. Electronically Signed   By: Melanie  Sanford M.D.   On: 11/09/2019 19:13   DG Foot Complete Right  Result Date: 11/08/2019 Please see detailed radiograph report in office note.   Microbiology: Recent Results (from the past 240 hour(s))  SARS CORONAVIRUS 2 (TAT 6-24 HRS) Nasopharyngeal Nasopharyngeal Swab     Status: None   Collection Time: 11/09/19  5:20 AM   Specimen: Nasopharyngeal Swab  Result Value Ref Range Status   SARS Coronavirus 2 NEGATIVE NEGATIVE Final    Comment: (NOTE) SARS-CoV-2 target nucleic acids are NOT DETECTED. The SARS-CoV-2 RNA is generally detectable in upper and lower respiratory specimens during the acute phase of infection. Negative results do not preclude SARS-CoV-2 infection, do not rule out co-infections with other pathogens, and should not be used as the sole basis for treatment or other patient management decisions. Negative results must be combined with clinical observations, patient history, and epidemiological information. The expected result is Negative. Fact Sheet for Patients: https://www.fda.gov/media/138098/download Fact Sheet for Healthcare Providers: https://www.fda.gov/media/138095/download This test is not yet approved or cleared by the [place] FDA and  has been authorized for detection and/or diagnosis of SARS-CoV-2 by FDA under an Emergency Use Authorization (EUA). This EUA will remain  in effect (meaning this test can be used) for the duration of  the COVID-19 declaration under Section 56 4(b)(1) of the Act, 21 U.S.C. section 360bbb-3(b)(1), unless the authorization is terminated or revoked sooner. Performed at [facility], [address] [place], [place] [address]   MRSA PCR Screening     Status: None   Collection Time: 11/09/19 10:09 AM   Specimen: Nasal Mucosa; Nasopharyngeal  Result Value Ref Range Status   MRSA by PCR NEGATIVE NEGATIVE Final    Comment:        The GeneXpert MRSA Assay (FDA approved for NASAL specimens only), is one component of a comprehensive MRSA colonization surveillance program. It is not intended to diagnose MRSA infection nor to guide or monitor treatment for MRSA infections. Performed at [facility], [address] , [place] [address]   Culture, blood (routine x 2)     Status: None (Preliminary result)   Collection Time: 11/09/19 10:42 AM   Specimen:   BLOOD LEFT FOREARM  Result Value Ref Range Status   Specimen Description BLOOD LEFT FOREARM  Final   Special Requests AEROBIC BOTTLE ONLY Blood Culture adequate volume  Final   Culture   Final    NO GROWTH < 24 HOURS Performed at [facility], [address] [place], [place] [address]    Report Status PENDING  Incomplete  Culture, blood (routine x 2)     Status: None (Preliminary result)   Collection Time: 11/09/19 10:55 AM   Specimen: BLOOD LEFT HAND  Result Value Ref Range Status   Specimen Description BLOOD LEFT HAND  Final   Special Requests   Final    BOTTLES DRAWN AEROBIC AND ANAEROBIC Blood Culture adequate volume   Culture   Final    NO GROWTH < 24 HOURS Performed at [facility], [address] [place], [place] [address]    Report Status PENDING  Incomplete  Aerobic/Anaerobic Culture (surgical/deep wound)     Status: None (Preliminary result)   Collection Time: 11/09/19  4:04 PM   Specimen: Wound  Result Value Ref Range Status   Specimen Description WOUND RIGHT FOOT  Final   Special  Requests NONE  Final   Gram Stain NO WBC SEEN NO ORGANISMS SEEN   Final   Culture   Final    NO GROWTH < 24 HOURS Performed at [facility], [address] [place], [place] [address]    Report Status PENDING  Incomplete     Labs: Basic Metabolic Panel: Recent Labs  Lab 11/08/19 1909 11/09/19 1054 11/10/19 0605  NA 139 139 141  K 4.2 4.3 4.1  CL 101 103 109  CO2 26 25 24  GLUCOSE 134* 176* 137*  BUN 18 16 12  CREATININE 0.88 0.83 0.84  CALCIUM 9.2 8.8* 8.7*  MG  --   --  1.6*   Liver Function Tests: Recent Labs  Lab 11/08/19 1909 11/10/19 0605  AST 10* 9*  ALT 10 8  ALKPHOS 89 76  BILITOT 0.3 0.4  PROT 7.5 6.6  ALBUMIN 2.8* 2.4*   No results for input(s): LIPASE, AMYLASE in the last 168 hours. No results for input(s): AMMONIA in the last 168 hours. CBC: Recent Labs  Lab 11/08/19 1909 11/09/19 1054 11/09/19 1231 11/10/19 0605  WBC 10.4 11.9*  --  8.6  NEUTROABS 6.1  --   --  5.5  HGB 9.8* 6.6* 10.6* 9.0*  HCT 33.0* 22.0* 35.8* 29.3*  MCV 83.1 84.3  --  82.3  PLT 441* 490*  --  405*   Cardiac Enzymes: No results for input(s): CKTOTAL, CKMB, CKMBINDEX, TROPONINI in the last 168 hours. BNP: BNP (last 3 results) No results for input(s): BNP in the last 8760 hours.  ProBNP (last 3 results) No results for input(s): PROBNP in the last 8760 hours.  CBG: Recent Labs  Lab 11/09/19 2037 11/10/19 0122 11/10/19 0747 11/10/19 1153 11/10/19 1426  GLUCAP 200* 181* 126* 165* 217*       Signed:  Nkeiruka J Ezenduka, MD [facility] 11/10/2019, 2:38 PM  

## 2019-11-10 NOTE — Progress Notes (Signed)
[name] to be D/C'd  per MD order. Discussed with the patient and all questions fully answered.  VSS, Skin clean, dry and intact without evidence of skin break down, no evidence of skin tears noted.  IV catheter discontinued intact. Site without signs and symptoms of complications. Dressing and pressure applied.  An After Visit Summary was printed and given to the patient. Patient was given printed order for a DME store pickup, per MD order.  D/c education completed with patient/family including follow up instructions, medication list, d/c activities limitations if indicated, with other d/c instructions as indicated by MD - patient able to verbalize understanding, all questions fully answered.   Patient instructed to return to ED, call 911, or call MD for any changes in condition.   Patient to be escorted via WC, and D/C home via private auto.

## 2019-11-10 NOTE — Progress Notes (Signed)
  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  The procedure was performed by Dr. Ardelle Anton.  I am here to see this patient on his behalf.  Patient was seen at bedside by me.  Patient is in good spirit.  She denies any other acute complaints.  She denies any pain to the right lower extremity.  Her dressing is clean dry and intact.  No strikethrough noted.  She denies any overnight events.  She denies any nausea fever chills vomiting shortness of breath.  Objective:   Vitals:   11/10/19 0118 11/10/19 0647  BP: (!) 149/56 137/84  Pulse: 68 75  Resp: 20 16  Temp: 99.7 F (37.6 C) 98.1 F (36.7 C)  SpO2: 97% 93%   General AA&O x3. Normal mood and affect.  Vascular Dorsalis pedis and posterior tibial pulses 2/4 bilat. Brisk capillary refill to all digits. Pedal hair present.  Neurologic Epicritic sensation grossly intact.  Dermatologic  wound was examined.  No purulent drainage was expressed.  Stitches are intact.  Wound is centrally packed open.  Good granulation tissue noted.  No further epidermal lysis or concern for infection at this time.  Mild redness surrounding the wound.  Orthopedic: MMT 5/5 in dorsiflexion, plantarflexion, inversion, and eversion. Normal joint ROM without pain or crepitus.    Assessment & Plan:  Patient was evaluated and treated and all questions answered.  Right foot status post partial fifth ray amputation partially closed -She was seen at bedside by me all questions and concerns were addressed. -Wound was evaluated no concern for further infection noted.  Stitches are intact.  The wound was repacked with iodoform packing Kerlix Kling and an Ace bandage. -Patient will need to be partial weightbearing to  the heel to the right lower extremity to allow for proper healing. -Physical therapy has been consulted -She will need home care nursing every other day for wound management with iodoform packing half-inch followed by wet-to-dry dressing changes. -Patient will be scheduled to see Dr. Ardelle Anton in office again next week once discharged from the hospital.  Candelaria Stagers, DPM  Accessible via secure chat for questions or concerns.

## 2019-11-10 NOTE — Plan of Care (Signed)
  Problem: Education: Goal: Knowledge of General Education information will improve Description: Including pain rating scale, medication(s)/side effects and non-pharmacologic comfort measures 11/10/2019 1437 by Theotis Burrow, RN Outcome: Adequate for Discharge 11/10/2019 1428 by Theotis Burrow, RN Outcome: Progressing   Problem: Health Behavior/Discharge Planning: Goal: Ability to manage health-related needs will improve 11/10/2019 1437 by Theotis Burrow, RN Outcome: Adequate for Discharge 11/10/2019 1428 by Theotis Burrow, RN Outcome: Progressing   Problem: Clinical Measurements: Goal: Ability to maintain clinical measurements within normal limits will improve 11/10/2019 1437 by Theotis Burrow, RN Outcome: Adequate for Discharge 11/10/2019 1428 by Theotis Burrow, RN Outcome: Progressing Goal: Will remain free from infection 11/10/2019 1437 by Theotis Burrow, RN Outcome: Adequate for Discharge 11/10/2019 1428 by Theotis Burrow, RN Outcome: Progressing Goal: Diagnostic test results will improve 11/10/2019 1437 by Theotis Burrow, RN Outcome: Adequate for Discharge 11/10/2019 1428 by Theotis Burrow, RN Outcome: Progressing Goal: Respiratory complications will improve 11/10/2019 1437 by Theotis Burrow, RN Outcome: Adequate for Discharge 11/10/2019 1428 by Theotis Burrow, RN Outcome: Progressing Goal: Cardiovascular complication will be avoided 11/10/2019 1437 by Theotis Burrow, RN Outcome: Adequate for Discharge 11/10/2019 1428 by Theotis Burrow, RN Outcome: Progressing   Problem: Activity: Goal: Risk for activity intolerance will decrease 11/10/2019 1437 by Theotis Burrow, RN Outcome: Adequate for Discharge 11/10/2019 1428 by Theotis Burrow, RN Outcome: Progressing   Problem: Nutrition: Goal: Adequate nutrition will be maintained 11/10/2019 1437 by Theotis Burrow, RN Outcome: Adequate for Discharge 11/10/2019 1428 by Theotis Burrow, RN Outcome: Progressing    Problem: Coping: Goal: Level of anxiety will decrease 11/10/2019 1437 by Theotis Burrow, RN Outcome: Adequate for Discharge 11/10/2019 1428 by Theotis Burrow, RN Outcome: Progressing   Problem: Elimination: Goal: Will not experience complications related to bowel motility 11/10/2019 1437 by Theotis Burrow, RN Outcome: Adequate for Discharge 11/10/2019 1428 by Theotis Burrow, RN Outcome: Progressing Goal: Will not experience complications related to urinary retention 11/10/2019 1437 by Theotis Burrow, RN Outcome: Adequate for Discharge 11/10/2019 1428 by Theotis Burrow, RN Outcome: Progressing   Problem: Skin Integrity: Goal: Risk for impaired skin integrity will decrease 11/10/2019 1437 by Theotis Burrow, RN Outcome: Adequate for Discharge 11/10/2019 1428 by Theotis Burrow, RN Outcome: Progressing

## 2019-11-11 DIAGNOSIS — E1169 Type 2 diabetes mellitus with other specified complication: Secondary | ICD-10-CM | POA: Diagnosis not present

## 2019-11-11 DIAGNOSIS — M86171 Other acute osteomyelitis, right ankle and foot: Secondary | ICD-10-CM | POA: Diagnosis not present

## 2019-11-11 DIAGNOSIS — Z4781 Encounter for orthopedic aftercare following surgical amputation: Secondary | ICD-10-CM | POA: Diagnosis not present

## 2019-11-11 DIAGNOSIS — E782 Mixed hyperlipidemia: Secondary | ICD-10-CM | POA: Diagnosis not present

## 2019-11-11 DIAGNOSIS — I10 Essential (primary) hypertension: Secondary | ICD-10-CM | POA: Diagnosis not present

## 2019-11-11 DIAGNOSIS — Z4801 Encounter for change or removal of surgical wound dressing: Secondary | ICD-10-CM | POA: Diagnosis not present

## 2019-11-11 DIAGNOSIS — E1142 Type 2 diabetes mellitus with diabetic polyneuropathy: Secondary | ICD-10-CM | POA: Diagnosis not present

## 2019-11-11 DIAGNOSIS — I69354 Hemiplegia and hemiparesis following cerebral infarction affecting left non-dominant side: Secondary | ICD-10-CM | POA: Diagnosis not present

## 2019-11-11 DIAGNOSIS — K219 Gastro-esophageal reflux disease without esophagitis: Secondary | ICD-10-CM | POA: Diagnosis not present

## 2019-11-11 LAB — BPAM RBC
Blood Product Expiration Date: 202105132359
Blood Product Expiration Date: 202105132359
Unit Type and Rh: 5100
Unit Type and Rh: 5100

## 2019-11-11 LAB — TYPE AND SCREEN
ABO/RH(D): O POS
Antibody Screen: NEGATIVE
Unit division: 0
Unit division: 0

## 2019-11-12 ENCOUNTER — Telehealth: Payer: Self-pay | Admitting: Podiatry

## 2019-11-12 NOTE — Telephone Encounter (Signed)
Patient's husband called in to request Post-Op to be scheduled for late evenings. DOS 11/09/19

## 2019-11-14 ENCOUNTER — Telehealth: Payer: Self-pay | Admitting: *Deleted

## 2019-11-14 ENCOUNTER — Telehealth (INDEPENDENT_AMBULATORY_CARE_PROVIDER_SITE_OTHER): Payer: Self-pay

## 2019-11-14 DIAGNOSIS — K219 Gastro-esophageal reflux disease without esophagitis: Secondary | ICD-10-CM | POA: Diagnosis not present

## 2019-11-14 DIAGNOSIS — Z4801 Encounter for change or removal of surgical wound dressing: Secondary | ICD-10-CM | POA: Diagnosis not present

## 2019-11-14 DIAGNOSIS — Z9889 Other specified postprocedural states: Secondary | ICD-10-CM

## 2019-11-14 DIAGNOSIS — I10 Essential (primary) hypertension: Secondary | ICD-10-CM | POA: Diagnosis not present

## 2019-11-14 DIAGNOSIS — E1169 Type 2 diabetes mellitus with other specified complication: Secondary | ICD-10-CM | POA: Diagnosis not present

## 2019-11-14 DIAGNOSIS — Z4781 Encounter for orthopedic aftercare following surgical amputation: Secondary | ICD-10-CM | POA: Diagnosis not present

## 2019-11-14 DIAGNOSIS — M869 Osteomyelitis, unspecified: Secondary | ICD-10-CM

## 2019-11-14 DIAGNOSIS — E782 Mixed hyperlipidemia: Secondary | ICD-10-CM | POA: Diagnosis not present

## 2019-11-14 DIAGNOSIS — I69354 Hemiplegia and hemiparesis following cerebral infarction affecting left non-dominant side: Secondary | ICD-10-CM | POA: Diagnosis not present

## 2019-11-14 DIAGNOSIS — E1142 Type 2 diabetes mellitus with diabetic polyneuropathy: Secondary | ICD-10-CM | POA: Diagnosis not present

## 2019-11-14 DIAGNOSIS — E1149 Type 2 diabetes mellitus with other diabetic neurological complication: Secondary | ICD-10-CM

## 2019-11-14 DIAGNOSIS — L97512 Non-pressure chronic ulcer of other part of right foot with fat layer exposed: Secondary | ICD-10-CM

## 2019-11-14 DIAGNOSIS — M86171 Other acute osteomyelitis, right ankle and foot: Secondary | ICD-10-CM | POA: Diagnosis not present

## 2019-11-14 LAB — AEROBIC/ANAEROBIC CULTURE W GRAM STAIN (SURGICAL/DEEP WOUND)
Culture: NO GROWTH
Gram Stain: NONE SEEN

## 2019-11-14 LAB — CULTURE, BLOOD (ROUTINE X 2)
Culture: NO GROWTH
Culture: NO GROWTH
Special Requests: ADEQUATE
Special Requests: ADEQUATE

## 2019-11-14 NOTE — Telephone Encounter (Signed)
[facility] - Lanae Boast, PT states pt has an appt tomorrow and would like to know pt's weight bearing status for transfer.

## 2019-11-14 NOTE — Telephone Encounter (Signed)
[facility]  Called. Natalia Leatherwood ,PT to request verbal order for PT services to pt to start today.Home visit today.  Eval/Treat for  strengthen, mobility, and education.  Durations is : 1wk/1 ;2wk/2 ;1wk/5.  Will fax a order over to signe for treatment later today.

## 2019-11-14 NOTE — Telephone Encounter (Signed)
Okay, I agree with the verbal order.

## 2019-11-15 ENCOUNTER — Ambulatory Visit (INDEPENDENT_AMBULATORY_CARE_PROVIDER_SITE_OTHER): Payer: Medicare PPO | Admitting: Podiatry

## 2019-11-15 ENCOUNTER — Ambulatory Visit: Payer: Medicare PPO | Admitting: Podiatry

## 2019-11-15 ENCOUNTER — Ambulatory Visit (INDEPENDENT_AMBULATORY_CARE_PROVIDER_SITE_OTHER): Payer: Medicare PPO

## 2019-11-15 ENCOUNTER — Other Ambulatory Visit: Payer: Self-pay

## 2019-11-15 ENCOUNTER — Encounter: Payer: Self-pay | Admitting: Podiatry

## 2019-11-15 VITALS — Temp 96.0°F | Resp 14

## 2019-11-15 DIAGNOSIS — L97512 Non-pressure chronic ulcer of other part of right foot with fat layer exposed: Secondary | ICD-10-CM | POA: Diagnosis not present

## 2019-11-15 DIAGNOSIS — M869 Osteomyelitis, unspecified: Secondary | ICD-10-CM

## 2019-11-15 DIAGNOSIS — E1149 Type 2 diabetes mellitus with other diabetic neurological complication: Secondary | ICD-10-CM

## 2019-11-15 NOTE — Telephone Encounter (Signed)
I spoke with [place] Aneta Mins and he states call [phone] for HME.

## 2019-11-15 NOTE — Telephone Encounter (Signed)
Unable to leave message the voicemail box is full.

## 2019-11-15 NOTE — Telephone Encounter (Signed)
I spoke with [address], she states they do not have rx for the hemi-walker and would need rx, clinicals and diagnosis with insurance and demographics, faxed to [phone]. Faxed rx, and required information to [employer].

## 2019-11-15 NOTE — Telephone Encounter (Signed)
I informed [facility] K. Anderson, PT that pt does have a surgeical shoe issued 11/09/2019, and I was sending orders to [place] for the hemi-walker and she requested I fax the weight bearing orders to [facility] [employer]. I faxed orders to [facility] .

## 2019-11-15 NOTE — Telephone Encounter (Signed)
Unable to leave a message voicemail box is full. 

## 2019-11-15 NOTE — Telephone Encounter (Signed)
I informed [facility] - Lanae Boast, PT of Dr. Gabriel Rung answer to the list of questions. Melanie Spencer states pt does not have a surgical boot or shoe, but is using a borrowed wheelchair and the hemi-walker was ordered from the hospital and may need prior approval.

## 2019-11-15 NOTE — Telephone Encounter (Signed)
I informed pt's husband, Jolene Provost of pt's weight bearing status, to continue in the shoe and the hemi-walker orders had been sent to [place], and to have pt clear her voicemail for messages in case of emergency.

## 2019-11-15 NOTE — Progress Notes (Signed)
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  She states that she has been doing well the home health nurse has been coming to the house to change her bandage.  The husband that she spends yesterday and he thinks the wound is looking healthy.  She is on doxycycline.  No significant drainage and no pus.  Redness has resolved.  No increase in swelling. Denies any systemic complaints such as fevers, chills, nausea, vomiting. No acute changes since last appointment, and no other complaints at this time.   Objective: AAO x3, NAD DP/PT pulses palpable bilaterally, CRT less than 3 seconds On the right foot the wound is partially closed.  There is a granular wound which appears to be superficial today measuring 5 x 2.3 x 0.1 cm along the area that was partially closed.  There is no surrounding erythema, ascending cellulitis.  There is no fluctuation or crepitation.  There is no malodor. No open lesions or pre-ulcerative lesions.  No pain with calf compression, swelling, warmth, erythema    Assessment: Partial fifth ray amputation with partial wound closure  Plan: -All treatment options discussed with the patient including all alternatives, risks, complications.  -For now and continuing wet-to-dry dressing changes.  I would order silver collagen dressing to apply to the wound.  Continuing the surgical shoe weightbearing as tolerated only for short distances and transfers.  Continue to elevate. -Patient encouraged to call the office with any questions, concerns, change in symptoms.   Vivi Barrack DPM

## 2019-11-15 NOTE — Telephone Encounter (Signed)
They sent me questions and here are the answers:  What is my WB status for transfers and walking- SHE CAN BE WBAT BUT ONLY FOR SHORT DISTANCES AROUND THE HOUSE. MUST WEAR SURGICAL SHOE Is there a "special orthotic boot" to wear on right foot when WB? CONTINUE WITH SURGICAL SHOE Still waiting for hemiwalker for stability? They have it ready at [facility] but apparently the Rx from the hospital is not valid. CAN YOU PLEASE CALL [facility] AND SEE WHAT THEY NEED?    Thank you.

## 2019-11-16 DIAGNOSIS — Z4781 Encounter for orthopedic aftercare following surgical amputation: Secondary | ICD-10-CM | POA: Diagnosis not present

## 2019-11-16 DIAGNOSIS — I69354 Hemiplegia and hemiparesis following cerebral infarction affecting left non-dominant side: Secondary | ICD-10-CM | POA: Diagnosis not present

## 2019-11-16 DIAGNOSIS — E1169 Type 2 diabetes mellitus with other specified complication: Secondary | ICD-10-CM | POA: Diagnosis not present

## 2019-11-16 DIAGNOSIS — I10 Essential (primary) hypertension: Secondary | ICD-10-CM | POA: Diagnosis not present

## 2019-11-16 DIAGNOSIS — E782 Mixed hyperlipidemia: Secondary | ICD-10-CM | POA: Diagnosis not present

## 2019-11-16 DIAGNOSIS — K219 Gastro-esophageal reflux disease without esophagitis: Secondary | ICD-10-CM | POA: Diagnosis not present

## 2019-11-16 DIAGNOSIS — M86171 Other acute osteomyelitis, right ankle and foot: Secondary | ICD-10-CM | POA: Diagnosis not present

## 2019-11-16 DIAGNOSIS — Z4801 Encounter for change or removal of surgical wound dressing: Secondary | ICD-10-CM | POA: Diagnosis not present

## 2019-11-16 DIAGNOSIS — E1142 Type 2 diabetes mellitus with diabetic polyneuropathy: Secondary | ICD-10-CM | POA: Diagnosis not present

## 2019-11-19 ENCOUNTER — Telehealth: Payer: Self-pay | Admitting: *Deleted

## 2019-11-19 DIAGNOSIS — M86171 Other acute osteomyelitis, right ankle and foot: Secondary | ICD-10-CM | POA: Diagnosis not present

## 2019-11-19 DIAGNOSIS — E782 Mixed hyperlipidemia: Secondary | ICD-10-CM | POA: Diagnosis not present

## 2019-11-19 DIAGNOSIS — Z4781 Encounter for orthopedic aftercare following surgical amputation: Secondary | ICD-10-CM | POA: Diagnosis not present

## 2019-11-19 DIAGNOSIS — Z4801 Encounter for change or removal of surgical wound dressing: Secondary | ICD-10-CM | POA: Diagnosis not present

## 2019-11-19 DIAGNOSIS — E1142 Type 2 diabetes mellitus with diabetic polyneuropathy: Secondary | ICD-10-CM | POA: Diagnosis not present

## 2019-11-19 DIAGNOSIS — I69354 Hemiplegia and hemiparesis following cerebral infarction affecting left non-dominant side: Secondary | ICD-10-CM | POA: Diagnosis not present

## 2019-11-19 DIAGNOSIS — K219 Gastro-esophageal reflux disease without esophagitis: Secondary | ICD-10-CM | POA: Diagnosis not present

## 2019-11-19 DIAGNOSIS — I10 Essential (primary) hypertension: Secondary | ICD-10-CM | POA: Diagnosis not present

## 2019-11-19 DIAGNOSIS — E1169 Type 2 diabetes mellitus with other specified complication: Secondary | ICD-10-CM | POA: Diagnosis not present

## 2019-11-19 NOTE — Telephone Encounter (Signed)
Dr. Ardelle Anton ordered silver collagen for right foot ulcer. Faxed orders to [facility].

## 2019-11-19 NOTE — Telephone Encounter (Signed)
-----   Message from Vivi Barrack, DPM sent at 11/15/2019  7:08 PM EDT ----- Can you please order silver collagen dressing? Thanks.   (note is complete)

## 2019-11-21 DIAGNOSIS — Z4801 Encounter for change or removal of surgical wound dressing: Secondary | ICD-10-CM | POA: Diagnosis not present

## 2019-11-21 DIAGNOSIS — I69354 Hemiplegia and hemiparesis following cerebral infarction affecting left non-dominant side: Secondary | ICD-10-CM | POA: Diagnosis not present

## 2019-11-21 DIAGNOSIS — E782 Mixed hyperlipidemia: Secondary | ICD-10-CM | POA: Diagnosis not present

## 2019-11-21 DIAGNOSIS — E1169 Type 2 diabetes mellitus with other specified complication: Secondary | ICD-10-CM | POA: Diagnosis not present

## 2019-11-21 DIAGNOSIS — E1142 Type 2 diabetes mellitus with diabetic polyneuropathy: Secondary | ICD-10-CM | POA: Diagnosis not present

## 2019-11-21 DIAGNOSIS — K219 Gastro-esophageal reflux disease without esophagitis: Secondary | ICD-10-CM | POA: Diagnosis not present

## 2019-11-21 DIAGNOSIS — Z4781 Encounter for orthopedic aftercare following surgical amputation: Secondary | ICD-10-CM | POA: Diagnosis not present

## 2019-11-21 DIAGNOSIS — I10 Essential (primary) hypertension: Secondary | ICD-10-CM | POA: Diagnosis not present

## 2019-11-21 DIAGNOSIS — M86171 Other acute osteomyelitis, right ankle and foot: Secondary | ICD-10-CM | POA: Diagnosis not present

## 2019-11-22 ENCOUNTER — Encounter: Payer: Self-pay | Admitting: Podiatry

## 2019-11-22 ENCOUNTER — Ambulatory Visit (INDEPENDENT_AMBULATORY_CARE_PROVIDER_SITE_OTHER): Payer: Medicare PPO | Admitting: Podiatry

## 2019-11-22 ENCOUNTER — Other Ambulatory Visit: Payer: Self-pay

## 2019-11-22 DIAGNOSIS — E1149 Type 2 diabetes mellitus with other diabetic neurological complication: Secondary | ICD-10-CM | POA: Diagnosis not present

## 2019-11-22 DIAGNOSIS — L97512 Non-pressure chronic ulcer of other part of right foot with fat layer exposed: Secondary | ICD-10-CM | POA: Diagnosis not present

## 2019-11-23 DIAGNOSIS — E1169 Type 2 diabetes mellitus with other specified complication: Secondary | ICD-10-CM | POA: Diagnosis not present

## 2019-11-23 DIAGNOSIS — E782 Mixed hyperlipidemia: Secondary | ICD-10-CM | POA: Diagnosis not present

## 2019-11-23 DIAGNOSIS — K219 Gastro-esophageal reflux disease without esophagitis: Secondary | ICD-10-CM | POA: Diagnosis not present

## 2019-11-23 DIAGNOSIS — I10 Essential (primary) hypertension: Secondary | ICD-10-CM | POA: Diagnosis not present

## 2019-11-23 DIAGNOSIS — I69354 Hemiplegia and hemiparesis following cerebral infarction affecting left non-dominant side: Secondary | ICD-10-CM | POA: Diagnosis not present

## 2019-11-23 DIAGNOSIS — E1142 Type 2 diabetes mellitus with diabetic polyneuropathy: Secondary | ICD-10-CM | POA: Diagnosis not present

## 2019-11-23 DIAGNOSIS — Z4801 Encounter for change or removal of surgical wound dressing: Secondary | ICD-10-CM | POA: Diagnosis not present

## 2019-11-23 DIAGNOSIS — M86171 Other acute osteomyelitis, right ankle and foot: Secondary | ICD-10-CM | POA: Diagnosis not present

## 2019-11-23 DIAGNOSIS — Z4781 Encounter for orthopedic aftercare following surgical amputation: Secondary | ICD-10-CM | POA: Diagnosis not present

## 2019-11-23 NOTE — Progress Notes (Signed)
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  States that she is doing well.  Did continue with a saline wet-to-dry dressing to the area.  Her husband thinks that the wound looks healthy.  They have no new concerns. Denies any systemic complaints such as fevers, chills, nausea, vomiting. No acute changes since last appointment, and no other complaints at this time.   Objective: AAO x3, NAD DP/PT pulses palpable bilaterally, CRT less than 3 seconds On the right foot the wound is partially closed.  The majority of the wound is still open has granular wound base.  Today the wound measures 4.5 x 2 cm and is superficial.  There was fibrotic tissue along the wound prior to debridement.  There is no surrounding erythema, ascending cellulitis.  There is no fluctuation or crepitation.  There is no malodor. No pain with calf compression, swelling, warmth, erythema      Assessment: Partial fifth ray amputation with partial wound closure  Plan: -All treatment options discussed with the patient including all alternatives, risks, complications.  -I removed the sutures that were present on the wound.  I sharply debrided the wound down to healthy, bleeding, granular tissue utilizing the 312 with scalpel.  For now to continue saline wet-to-dry.  I am going to try to order an Integra graft to apply in the office to help expedite healing and she is a high risk of infection and further amputation of the wound is not healed.  Continue with offloading shoe and elevation.  Return in about 1 week (around 11/29/2019) for wound check .  Vivi Barrack DPM

## 2019-11-26 ENCOUNTER — Telehealth: Payer: Self-pay | Admitting: *Deleted

## 2019-11-26 NOTE — Telephone Encounter (Signed)
-----   Message from Vivi Barrack, DPM sent at 11/23/2019  4:03 PM EDT ----- Can we try to order a graft to apply to her wound? I would like to use the Integra one that Dr. Allena Katz has been using. Thanks.

## 2019-11-27 ENCOUNTER — Other Ambulatory Visit: Payer: Self-pay

## 2019-11-27 ENCOUNTER — Ambulatory Visit (INDEPENDENT_AMBULATORY_CARE_PROVIDER_SITE_OTHER): Payer: Medicare PPO | Admitting: Podiatry

## 2019-11-27 ENCOUNTER — Telehealth: Payer: Self-pay | Admitting: *Deleted

## 2019-11-27 ENCOUNTER — Encounter: Payer: Self-pay | Admitting: Podiatry

## 2019-11-27 DIAGNOSIS — E1149 Type 2 diabetes mellitus with other diabetic neurological complication: Secondary | ICD-10-CM | POA: Diagnosis not present

## 2019-11-27 DIAGNOSIS — L97512 Non-pressure chronic ulcer of other part of right foot with fat layer exposed: Secondary | ICD-10-CM | POA: Diagnosis not present

## 2019-11-27 NOTE — Telephone Encounter (Signed)
[facility] requested a written RX for a Hemi walker and insurance information and per Dr Ardelle Anton wrote a prescription for the Hemi walker and sent back to Melanie Spencer at [facility] today. Misty Stanley

## 2019-11-28 ENCOUNTER — Telehealth (INDEPENDENT_AMBULATORY_CARE_PROVIDER_SITE_OTHER): Payer: Self-pay

## 2019-11-28 ENCOUNTER — Encounter (INDEPENDENT_AMBULATORY_CARE_PROVIDER_SITE_OTHER): Payer: Self-pay | Admitting: Internal Medicine

## 2019-11-28 ENCOUNTER — Ambulatory Visit (INDEPENDENT_AMBULATORY_CARE_PROVIDER_SITE_OTHER): Payer: Medicare PPO | Admitting: Internal Medicine

## 2019-11-28 VITALS — BP 128/72 | HR 82 | Temp 97.2°F | Resp 18 | Ht 67.0 in | Wt 233.4 lb

## 2019-11-28 DIAGNOSIS — E1165 Type 2 diabetes mellitus with hyperglycemia: Secondary | ICD-10-CM | POA: Diagnosis not present

## 2019-11-28 DIAGNOSIS — I10 Essential (primary) hypertension: Secondary | ICD-10-CM

## 2019-11-28 MED ORDER — TRULICITY 1.5 MG/0.5ML ~~LOC~~ SOAJ: 1.5000 mg | SUBCUTANEOUS | 3 refills | Status: DC

## 2019-11-28 NOTE — Patient Instructions (Signed)
[facility] Dietary Recommendations for Weight Loss What to Avoid . Avoid added sugars o Often added sugar can be found in processed foods such as many condiments, dry cereals, cakes, cookies, chips, crisps, crackers, candies, sweetened drinks, etc.  o Read labels and AVOID/DECREASE use of foods with the following in their ingredient list: Sugar, fructose, high fructose corn syrup, sucrose, glucose, maltose, dextrose, molasses, cane sugar, brown sugar, any type of syrup, agave nectar, etc.   . Avoid snacking in between meals . Avoid foods made with flour o If you are going to eat food made with flour, choose those made with whole-grains; and, minimize your consumption as much as is tolerable . Avoid processed foods o These foods are generally stocked in the middle of the grocery store. Focus on shopping on the perimeter of the grocery.  . Avoid Meat  o We recommend following a plant-based diet at [facility]. Thus, we recommend avoiding meat as a general rule. Consider eating beans, legumes, eggs, and/or dairy products for regular protein sources o If you plan on eating meat limit to 4 ounces of meat at a time and choose lean options such as Fish, chicken, turkey. Avoid red meat intake such as pork and/or steak What to Include . Vegetables o GREEN LEAFY VEGETABLES: Kale, spinach, mustard greens, collard greens, cabbage, broccoli, etc. o OTHER: Asparagus, cauliflower, eggplant, carrots, peas, Brussel sprouts, tomatoes, bell peppers, zucchini, beets, cucumbers, etc. . Grains, seeds, and legumes o Beans: kidney beans, black eyed peas, garbanzo beans, black beans, pinto beans, etc. o Whole, unrefined grains: brown rice, barley, bulgur, oatmeal, etc. . Healthy fats  o Avoid highly processed fats such as vegetable oil o Examples of healthy fats: avocado, olives, virgin olive oil, dark chocolate (?72% Cocoa), nuts (peanuts, almonds, walnuts, cashews, pecans, etc.) . None to Low  Intake of Animal Sources of Protein o Meat sources: chicken, turkey, salmon, tuna. Limit to 4 ounces of meat at one time. o Consider limiting dairy sources, but when choosing dairy focus on: PLAIN Greek yogurt, cottage cheese, high-protein milk . Fruit o Choose berries  When to Eat . Intermittent Fasting: o Choosing not to eat for a specific time period, but DO FOCUS ON HYDRATION when fasting o Multiple Techniques: - Time Restricted Eating: eat 3 meals in a day, each meal lasting no more than 60 minutes, no snacks between meals - 16-18 hour fast: fast for 16 to 18 hours up to 7 days a week. Often suggested to start with 2-3 nonconsecutive days per week.  . Remember the time you sleep is counted as fasting.  . Examples of eating schedule: Fast from 7:00pm-11:00am. Eat between 11:00am-7:00pm.  - 24-hour fast: fast for 24 hours up to every other day. Often suggested to start with 1 day per week . Remember the time you sleep is counted as fasting . Examples of eating schedule:  o Eating day: eat 2-3 meals on your eating day. If doing 2 meals, each meal should last no more than 90 minutes. If doing 3 meals, each meal should last no more than 60 minutes. Finish last meal by 7:00pm. o Fasting day: Fast until 7:00pm.  o IF YOU FEEL UNWELL FOR ANY REASON/IN ANY WAY WHEN FASTING, STOP FASTING BY EATING A NUTRITIOUS SNACK OR LIGHT MEAL o ALWAYS FOCUS ON HYDRATION DURING FASTS - Acceptable Hydration sources: water, broths, tea/coffee (black tea/coffee is best but using a small amount of whole-fat dairy products in coffee/tea is acceptable).  -   Poor Hydration Sources: anything with sugar or artificial sweeteners added to it  These recommendations have been developed for patients that are actively receiving medical care from either Dr. Lavel Rieman or Sarah Gray, DNP, NP-C at [facility]. These recommendations are developed for patients with specific medical conditions and are not meant to be  distributed or used by others that are not actively receiving care from either provider listed above at [facility]. It is not appropriate to participate in the above eating plans without proper medical supervision.   Reference: Fung, J. The obesity code. [place]: [place]; 2016.   

## 2019-11-28 NOTE — Telephone Encounter (Signed)
Thanks

## 2019-11-28 NOTE — Telephone Encounter (Signed)
-----   Message from Vivi Barrack, DPM sent at 11/27/2019  5:38 PM EDT ----- Yes, thanks.  ----- Message ----- From: Marissa Nestle, RN Sent: 11/27/2019  12:47 PM EDT To: Vivi Barrack, DPM  Dr. Ardelle Anton, Dr. Allena Katz is using Atracent from [facility] is this the graft you would like? Joya San ----- Message ----- From: Vivi Barrack, DPM Sent: 11/23/2019   4:03 PM EDT To: Marissa Nestle, RN  Can we try to order a graft to apply to her wound? I would like to use the Integra one that Dr. Allena Katz has been using. Thanks.

## 2019-11-28 NOTE — Telephone Encounter (Signed)
This patient is notorious for being noncompliant.  Please let the lady from [facility] know that they need to insist that she does physical therapy twice a week and they should schedule it around her doctors visits.

## 2019-11-28 NOTE — Progress Notes (Signed)
Metrics: Intervention Frequency ACO  Documented Smoking Status Yearly  Screened one or more times in 24 months  Cessation Counseling or  Active cessation medication Past 24 months  Past 24 months   Guideline developer: UpToDate (See UpToDate for funding source) Date Released: 2014       Wellness Office Visit  Subjective:  Patient ID: Melanie Spencer, female    DOB: March 28, 1961  Age: 59 y.o. MRN: [id number]  CC: This lady comes in for follow-up of hypertension, uncontrolled diabetes, morbid obesity. HPI  She recently has had foot surgery for complications from diabetes.  She has had a ray amputation on the right foot.  This seems to have helped her attitude tremendously towards diabetes.  She is eating healthier and eating sugar-free food, eating more vegetables.  As a result, her diabetes is improving.  Also, she feels that Trulicity has helped her tremendously, I agree with this.  She is able to reduce her insulin doses now. Past Medical History:  Diagnosis Date  . Cerebrovascular disease 05/23/2019  . Essential hypertension, benign 05/23/2019  . History of stroke 2007   left leg weakness  . HLD (hyperlipidemia) 05/23/2019  . Morbid obesity ([place]) 05/23/2019  . Type II diabetes mellitus, uncontrolled ([place]) 05/23/2019  . Vitamin D deficiency       Family History  Problem Relation Age of Onset  . Heart disease Mother   . Diabetes Mother   . Hypertension Mother   . Cancer Father        Lung  . Hypertension Sister   . Macular degeneration Sister   . Appendicitis Brother   . Hypertension Sister   . Diabetes Sister     Social History   Social History Narrative   Married for 31 yrs.On disability secondary to CVA.Previously licensed Medical illustrator.   Social History   Tobacco Use  . Smoking status: Former Smoker    Packs/day: 2.00    Years: 20.00    Pack years: 40.00    Types: Cigarettes    Quit date: 07/26/2005    Years since quitting: 14.3  . Smokeless tobacco: Never  Used  Substance Use Topics  . Alcohol use: No    Alcohol/week: 0.0 standard drinks    Current Meds  Medication Sig  . ACCU-CHEK GUIDE test strip   . aspirin 325 MG tablet Take 325 mg by mouth daily.  . Blood Glucose Monitoring Suppl (ACCU-CHEK GUIDE ME) w/Device KIT   . Blood Pressure Monitoring (BLOOD PRESSURE MONITOR AUTOMAT) DEVI Check blood pressure at home daily  . cholecalciferol (VITAMIN D) 1000 UNITS tablet Take 1,000 Units by mouth daily.   . ferrous sulfate (FERROUSUL) 325 (65 FE) MG tablet Take 1 tablet (325 mg total) by mouth daily with breakfast.  . fluticasone (FLONASE) 50 MCG/ACT nasal spray SHAKE LIQUID AND USE 2 SPRAYS IN EACH NOSTRIL EVERY DAY  . gabapentin (NEURONTIN) 300 MG capsule TAKE 2 CAPSULES BY MOUTH THREE TIMES DAILY (Patient taking differently: Take 600 mg by mouth 3 (three) times daily. )  . LANTUS SOLOSTAR 100 UNIT/ML Solostar Pen ADMINISTER 75 UNITS UNDER THE SKIN AT BEDTIME (Patient taking differently: Inject 50 Units into the skin at bedtime. )  . metFORMIN (GLUCOPHAGE) 1000 MG tablet TAKE 1 TABLET BY MOUTH TWICE DAILY  . omeprazole (PRILOSEC) 20 MG capsule TAKE 1 CAPSULE BY MOUTH DAILY  . oxybutynin (DITROPAN) 5 MG tablet TAKE 1 TABLET(5 MG) BY MOUTH THREE TIMES DAILY (Patient taking differently: Take 5 mg by mouth 2 (  two) times daily. )  . ramipril (ALTACE) 10 MG capsule Take 2 capsules (20 mg total) by mouth daily.  . simvastatin (ZOCOR) 40 MG tablet TAKE 1 TABLET BY MOUTH AT BEDTIME (Patient taking differently: Take 40 mg by mouth at bedtime. )  . TRULICITY 3.15 XY/5.8PF SOPN ADMINISTER 0.75 MG UNDER THE SKIN 1 TIME A WEEK (Patient taking differently: Inject 0.75 mg into the skin every Sunday. )    Objective:   Today's Vitals: BP 128/72 (BP Location: Right Arm, Patient Position: Sitting, Cuff Size: Normal)   Pulse 82   Temp (!) 97.2 F (36.2 C) (Temporal)   Resp 18   Ht 5' 7" (1.702 m)   Wt 233 lb 6.4 oz (105.9 kg)   LMP 03/02/2012   BMI 36.56  kg/m  Vitals with BMI 11/28/2019 11/10/2019 11/10/2019  Height 5' 7" - -  Weight 233 lbs 6 oz - -  BMI 29.24 - -  Systolic [phone]  Diastolic 72 52 84  Pulse 82 66 75     Physical Exam   She still looks chronically unwell but she has lost about 7 pounds since the last time she was seen in this office.  Blood pressure is excellent.    Assessment   1. Uncontrolled type 2 diabetes mellitus with hyperglycemia ([place])   2. Essential hypertension, benign   3. Morbid obesity ([place])       Tests ordered No orders of the defined types were placed in this encounter.    Plan: 1. I think we can improve her diabetes further and I have increased the dose of Trulicity to 1.5 mg once a week and sent a new prescription.  She will focus on more of a plant-based diet which I described to her and gave her a diet handout for and hopefully she can also continue to reduce her insulin requirements. 2. Her blood pressure is under excellent control and she will continue with the ACE inhibitor. 3. I will see her in 3 months time for follow-up and see how she is doing and we will do all the blood work.   Meds ordered this encounter  Medications  . Dulaglutide (TRULICITY) 1.5 RN/1.6FB SOPN    Sig: Inject 1.5 mg into the skin once a week.    Dispense:  4 pen    Refill:  3    Bellamie Turney Luther Parody, MD

## 2019-11-28 NOTE — Telephone Encounter (Signed)
Baird Lyons from [facility] is calling stating that Melanie Spencer is declining Physical Therapy twice a week only wanting to do once a week due to Dr's visits

## 2019-11-28 NOTE — Telephone Encounter (Signed)
Atracent - rep. Matt Sides with [facility] [employer] presented to office for information concerning Dr. Gabriel Rung order for Atracent graft. M. Sides completed required form and I printed clinicals and demographics with copy of insurance card for rep.

## 2019-11-29 DIAGNOSIS — E782 Mixed hyperlipidemia: Secondary | ICD-10-CM | POA: Diagnosis not present

## 2019-11-29 DIAGNOSIS — K219 Gastro-esophageal reflux disease without esophagitis: Secondary | ICD-10-CM | POA: Diagnosis not present

## 2019-11-29 DIAGNOSIS — E1142 Type 2 diabetes mellitus with diabetic polyneuropathy: Secondary | ICD-10-CM | POA: Diagnosis not present

## 2019-11-29 DIAGNOSIS — Z4781 Encounter for orthopedic aftercare following surgical amputation: Secondary | ICD-10-CM | POA: Diagnosis not present

## 2019-11-29 DIAGNOSIS — M86171 Other acute osteomyelitis, right ankle and foot: Secondary | ICD-10-CM | POA: Diagnosis not present

## 2019-11-29 DIAGNOSIS — E1169 Type 2 diabetes mellitus with other specified complication: Secondary | ICD-10-CM | POA: Diagnosis not present

## 2019-11-29 DIAGNOSIS — Z4801 Encounter for change or removal of surgical wound dressing: Secondary | ICD-10-CM | POA: Diagnosis not present

## 2019-11-29 DIAGNOSIS — I10 Essential (primary) hypertension: Secondary | ICD-10-CM | POA: Diagnosis not present

## 2019-11-29 DIAGNOSIS — I69354 Hemiplegia and hemiparesis following cerebral infarction affecting left non-dominant side: Secondary | ICD-10-CM | POA: Diagnosis not present

## 2019-11-30 DIAGNOSIS — Z4781 Encounter for orthopedic aftercare following surgical amputation: Secondary | ICD-10-CM | POA: Diagnosis not present

## 2019-11-30 DIAGNOSIS — M86171 Other acute osteomyelitis, right ankle and foot: Secondary | ICD-10-CM | POA: Diagnosis not present

## 2019-11-30 DIAGNOSIS — K219 Gastro-esophageal reflux disease without esophagitis: Secondary | ICD-10-CM | POA: Diagnosis not present

## 2019-11-30 DIAGNOSIS — E1169 Type 2 diabetes mellitus with other specified complication: Secondary | ICD-10-CM | POA: Diagnosis not present

## 2019-11-30 DIAGNOSIS — E782 Mixed hyperlipidemia: Secondary | ICD-10-CM | POA: Diagnosis not present

## 2019-11-30 DIAGNOSIS — I10 Essential (primary) hypertension: Secondary | ICD-10-CM | POA: Diagnosis not present

## 2019-11-30 DIAGNOSIS — E1142 Type 2 diabetes mellitus with diabetic polyneuropathy: Secondary | ICD-10-CM | POA: Diagnosis not present

## 2019-11-30 DIAGNOSIS — Z4801 Encounter for change or removal of surgical wound dressing: Secondary | ICD-10-CM | POA: Diagnosis not present

## 2019-11-30 DIAGNOSIS — I69354 Hemiplegia and hemiparesis following cerebral infarction affecting left non-dominant side: Secondary | ICD-10-CM | POA: Diagnosis not present

## 2019-12-03 DIAGNOSIS — I10 Essential (primary) hypertension: Secondary | ICD-10-CM | POA: Diagnosis not present

## 2019-12-03 DIAGNOSIS — E782 Mixed hyperlipidemia: Secondary | ICD-10-CM | POA: Diagnosis not present

## 2019-12-03 DIAGNOSIS — M86171 Other acute osteomyelitis, right ankle and foot: Secondary | ICD-10-CM | POA: Diagnosis not present

## 2019-12-03 DIAGNOSIS — Z4781 Encounter for orthopedic aftercare following surgical amputation: Secondary | ICD-10-CM | POA: Diagnosis not present

## 2019-12-03 DIAGNOSIS — K219 Gastro-esophageal reflux disease without esophagitis: Secondary | ICD-10-CM | POA: Diagnosis not present

## 2019-12-03 DIAGNOSIS — E1142 Type 2 diabetes mellitus with diabetic polyneuropathy: Secondary | ICD-10-CM | POA: Diagnosis not present

## 2019-12-03 DIAGNOSIS — Z4801 Encounter for change or removal of surgical wound dressing: Secondary | ICD-10-CM | POA: Diagnosis not present

## 2019-12-03 DIAGNOSIS — I69354 Hemiplegia and hemiparesis following cerebral infarction affecting left non-dominant side: Secondary | ICD-10-CM | POA: Diagnosis not present

## 2019-12-03 DIAGNOSIS — E1169 Type 2 diabetes mellitus with other specified complication: Secondary | ICD-10-CM | POA: Diagnosis not present

## 2019-12-04 ENCOUNTER — Ambulatory Visit (INDEPENDENT_AMBULATORY_CARE_PROVIDER_SITE_OTHER): Payer: Medicare PPO | Admitting: Podiatry

## 2019-12-04 ENCOUNTER — Other Ambulatory Visit: Payer: Self-pay

## 2019-12-04 DIAGNOSIS — E1149 Type 2 diabetes mellitus with other diabetic neurological complication: Secondary | ICD-10-CM | POA: Diagnosis not present

## 2019-12-04 DIAGNOSIS — L97512 Non-pressure chronic ulcer of other part of right foot with fat layer exposed: Secondary | ICD-10-CM

## 2019-12-04 MED ORDER — SANTYL 250 UNIT/GM EX OINT: 1.0000 "application " | TOPICAL_OINTMENT | Freq: Every day | CUTANEOUS | 1 refills | Status: DC

## 2019-12-06 ENCOUNTER — Encounter: Payer: Medicare PPO | Admitting: Podiatry

## 2019-12-06 NOTE — Progress Notes (Signed)
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  Her husband states that the wound is healing well and looks to be healthy.  Denies any drainage or pus.  No increased swelling or redness. Denies any systemic complaints such as fevers, chills, nausea, vomiting. No acute changes since last appointment, and no other complaints at this time.   Objective: AAO x3, NAD DP/PT pulses palpable bilaterally, CRT less than 3 seconds On the right foot the wound is partially closed.  The majority of the wound is still open has granular wound base.  Today the wound measures 4 x 2 cm and is superficial with Tyler granular wound base  There was fibrotic tissue along the wound prior to debridement.  There is no surrounding erythema, ascending cellulitis.  There is no fluctuation or crepitation.  There is no malodor. 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  -Today I sharply debrided the wound down to healthy, bleeding, granular tissue utilizing the 312 with scalpel.  Were to continue same at a dry for now.  Awaiting approval from insurance as far as the grafts.  Continue with surgical shoe and offloading.  Try to stay off the foot as much as possible.  Elevation.  Return in about 1 week (around 12/04/2019).  Vivi Barrack DPM

## 2019-12-09 NOTE — Progress Notes (Signed)
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  States that she is doing well.-Continue with saline wet-to-dry.  She can transfer off of her foot but she does walk with a surgical shoe.  Denies any systemic complaints such as fevers, chills, nausea, vomiting. No acute changes since last appointment, and no other complaints at this time.   Objective: AAO x3, NAD DP/PT pulses palpable bilaterally, CRT less than 3 seconds On the right foot the wound is partially closed.  The majority of the wound is still open has granular wound base but continues to improve.  Today the wound measures 4 x 1.5 cm and is superficial with a fibrogranular wound base  There was fibrotic tissue along the wound prior to debridement.  After debridement the pain was more granular.  There is no surrounding erythema, ascending cellulitis.  There is no fluctuation or crepitation.  There is no malodor. 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  -Today I sharply debrided the wound down to healthy, bleeding, granular tissue utilizing the 312 with scalpel.  Continue with saline wet-to-dry for now.  Awaiting approval from insurance as far as the grafts.  Continue with surgical shoe and offloading.  Try to stay off the foot as much as possible.  Elevation.  Return in about 1 week   Vivi Barrack DPM

## 2019-12-11 ENCOUNTER — Other Ambulatory Visit: Payer: Self-pay

## 2019-12-11 ENCOUNTER — Ambulatory Visit (INDEPENDENT_AMBULATORY_CARE_PROVIDER_SITE_OTHER): Payer: Medicare PPO | Admitting: Podiatry

## 2019-12-11 DIAGNOSIS — E1169 Type 2 diabetes mellitus with other specified complication: Secondary | ICD-10-CM | POA: Diagnosis not present

## 2019-12-11 DIAGNOSIS — K219 Gastro-esophageal reflux disease without esophagitis: Secondary | ICD-10-CM | POA: Diagnosis not present

## 2019-12-11 DIAGNOSIS — E1149 Type 2 diabetes mellitus with other diabetic neurological complication: Secondary | ICD-10-CM

## 2019-12-11 DIAGNOSIS — Z4801 Encounter for change or removal of surgical wound dressing: Secondary | ICD-10-CM | POA: Diagnosis not present

## 2019-12-11 DIAGNOSIS — Z4781 Encounter for orthopedic aftercare following surgical amputation: Secondary | ICD-10-CM | POA: Diagnosis not present

## 2019-12-11 DIAGNOSIS — L97512 Non-pressure chronic ulcer of other part of right foot with fat layer exposed: Secondary | ICD-10-CM

## 2019-12-11 DIAGNOSIS — E782 Mixed hyperlipidemia: Secondary | ICD-10-CM | POA: Diagnosis not present

## 2019-12-11 DIAGNOSIS — I10 Essential (primary) hypertension: Secondary | ICD-10-CM | POA: Diagnosis not present

## 2019-12-11 DIAGNOSIS — M86171 Other acute osteomyelitis, right ankle and foot: Secondary | ICD-10-CM | POA: Diagnosis not present

## 2019-12-11 DIAGNOSIS — E1142 Type 2 diabetes mellitus with diabetic polyneuropathy: Secondary | ICD-10-CM | POA: Diagnosis not present

## 2019-12-11 DIAGNOSIS — I69354 Hemiplegia and hemiparesis following cerebral infarction affecting left non-dominant side: Secondary | ICD-10-CM | POA: Diagnosis not present

## 2019-12-13 ENCOUNTER — Telehealth (INDEPENDENT_AMBULATORY_CARE_PROVIDER_SITE_OTHER): Payer: Self-pay

## 2019-12-13 DIAGNOSIS — I10 Essential (primary) hypertension: Secondary | ICD-10-CM | POA: Diagnosis not present

## 2019-12-13 DIAGNOSIS — Z4801 Encounter for change or removal of surgical wound dressing: Secondary | ICD-10-CM | POA: Diagnosis not present

## 2019-12-13 DIAGNOSIS — E1142 Type 2 diabetes mellitus with diabetic polyneuropathy: Secondary | ICD-10-CM | POA: Diagnosis not present

## 2019-12-13 DIAGNOSIS — Z4781 Encounter for orthopedic aftercare following surgical amputation: Secondary | ICD-10-CM | POA: Diagnosis not present

## 2019-12-13 DIAGNOSIS — E782 Mixed hyperlipidemia: Secondary | ICD-10-CM | POA: Diagnosis not present

## 2019-12-13 DIAGNOSIS — E1169 Type 2 diabetes mellitus with other specified complication: Secondary | ICD-10-CM | POA: Diagnosis not present

## 2019-12-13 DIAGNOSIS — K219 Gastro-esophageal reflux disease without esophagitis: Secondary | ICD-10-CM | POA: Diagnosis not present

## 2019-12-13 DIAGNOSIS — I69354 Hemiplegia and hemiparesis following cerebral infarction affecting left non-dominant side: Secondary | ICD-10-CM | POA: Diagnosis not present

## 2019-12-13 DIAGNOSIS — M86171 Other acute osteomyelitis, right ankle and foot: Secondary | ICD-10-CM | POA: Diagnosis not present

## 2019-12-13 NOTE — Telephone Encounter (Signed)
Joey the physical therapist with [facility] from home called and stated that he went to do therapy with Melanie Spencer today he he got no answer at the door, doors were locked and no answer by phone, so he marked this as a missed visit and will try to follow up again with her next week

## 2019-12-14 ENCOUNTER — Other Ambulatory Visit (INDEPENDENT_AMBULATORY_CARE_PROVIDER_SITE_OTHER): Payer: Self-pay | Admitting: Internal Medicine

## 2019-12-17 ENCOUNTER — Other Ambulatory Visit: Payer: Self-pay

## 2019-12-17 ENCOUNTER — Encounter: Payer: Self-pay | Admitting: Podiatry

## 2019-12-17 ENCOUNTER — Ambulatory Visit (INDEPENDENT_AMBULATORY_CARE_PROVIDER_SITE_OTHER): Payer: Medicare PPO | Admitting: Podiatry

## 2019-12-17 DIAGNOSIS — E1149 Type 2 diabetes mellitus with other diabetic neurological complication: Secondary | ICD-10-CM

## 2019-12-17 DIAGNOSIS — L97512 Non-pressure chronic ulcer of other part of right foot with fat layer exposed: Secondary | ICD-10-CM

## 2019-12-18 DIAGNOSIS — E1142 Type 2 diabetes mellitus with diabetic polyneuropathy: Secondary | ICD-10-CM | POA: Diagnosis not present

## 2019-12-18 DIAGNOSIS — K219 Gastro-esophageal reflux disease without esophagitis: Secondary | ICD-10-CM | POA: Diagnosis not present

## 2019-12-18 DIAGNOSIS — Z4801 Encounter for change or removal of surgical wound dressing: Secondary | ICD-10-CM | POA: Diagnosis not present

## 2019-12-18 DIAGNOSIS — E782 Mixed hyperlipidemia: Secondary | ICD-10-CM | POA: Diagnosis not present

## 2019-12-18 DIAGNOSIS — M86171 Other acute osteomyelitis, right ankle and foot: Secondary | ICD-10-CM | POA: Diagnosis not present

## 2019-12-18 DIAGNOSIS — I10 Essential (primary) hypertension: Secondary | ICD-10-CM | POA: Diagnosis not present

## 2019-12-18 DIAGNOSIS — Z4781 Encounter for orthopedic aftercare following surgical amputation: Secondary | ICD-10-CM | POA: Diagnosis not present

## 2019-12-18 DIAGNOSIS — I69354 Hemiplegia and hemiparesis following cerebral infarction affecting left non-dominant side: Secondary | ICD-10-CM | POA: Diagnosis not present

## 2019-12-18 DIAGNOSIS — E1169 Type 2 diabetes mellitus with other specified complication: Secondary | ICD-10-CM | POA: Diagnosis not present

## 2019-12-18 NOTE — Progress Notes (Signed)
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  States that she is doing well the wound is continue to heal.  They have started Santyl dressing changes daily without any side effects or new issues or concerns.  She is on a surgical shoe and offloading.  She is currently off the foot is much as possible. No new concerns today.  Denies any systemic complaints such as fevers, chills, nausea, vomiting. No acute changes since last appointment, and no other complaints at this time.   Objective: AAO x3, NAD DP/PT pulses palpable bilaterally, CRT less than 3 seconds On the right foot the wound is partially closed.  The majority of the wound is still open has granular wound base but continues to improve.  Today the wound measures 3.9 x 0.9 cm and is superficial with a fibrogranular wound base.  There is more granulation tissue today compared to previous.  After debridement there was no probing, undermining or tunneling.  No surrounding erythema, ascending cellulitis.  No fluctuation palpitation malodor. 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 -Remove the sutures today. -Today I sharply debrided the wound down to healthy, bleeding, granular tissue utilizing the 312 with scalpel.  Continue with Santyl continue with surgical shoe and offloading.  Try to stay off the foot as much as possible.  Elevation.  Return in about 1 week   Vivi Barrack DPM

## 2019-12-18 NOTE — Progress Notes (Signed)
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  States that she is doing well no pain.  Denies any drainage or pus.  They are continue with saline wet-to-dry dressing changes.  No new concerns today.   Denies any systemic complaints such as fevers, chills, nausea, vomiting. No acute changes since last appointment, and no other complaints at this time.   Objective: AAO x3, NAD DP/PT pulses palpable bilaterally, CRT less than 3 seconds On the right foot the wound is partially closed.  Fibrotic tissue to the wound base.  After debridement of the wound measures 4.1 x 1.1 cm.  There is no probing, undermining or tunneling.  No surrounding erythema, ascending cellulitis.  Proximal aspect incision with sutures intact.  There is no areas of fluctuation crepitation but there is no malodor. 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  -Today I sharply debrided the wound down to healthy, bleeding, granular tissue utilizing the 312 with scalpel.  We will switch to Santyl dressing changes as ordered today.  Unfortunate the graft was denied by insurance.  Continue surgical shoe, offloading.   Return in about 1 week (around 12/18/2019).  Vivi Barrack DPM

## 2019-12-20 DIAGNOSIS — M86171 Other acute osteomyelitis, right ankle and foot: Secondary | ICD-10-CM | POA: Diagnosis not present

## 2019-12-20 DIAGNOSIS — E1142 Type 2 diabetes mellitus with diabetic polyneuropathy: Secondary | ICD-10-CM | POA: Diagnosis not present

## 2019-12-20 DIAGNOSIS — I69354 Hemiplegia and hemiparesis following cerebral infarction affecting left non-dominant side: Secondary | ICD-10-CM | POA: Diagnosis not present

## 2019-12-20 DIAGNOSIS — E1169 Type 2 diabetes mellitus with other specified complication: Secondary | ICD-10-CM | POA: Diagnosis not present

## 2019-12-20 DIAGNOSIS — K219 Gastro-esophageal reflux disease without esophagitis: Secondary | ICD-10-CM | POA: Diagnosis not present

## 2019-12-20 DIAGNOSIS — Z4781 Encounter for orthopedic aftercare following surgical amputation: Secondary | ICD-10-CM | POA: Diagnosis not present

## 2019-12-20 DIAGNOSIS — Z4801 Encounter for change or removal of surgical wound dressing: Secondary | ICD-10-CM | POA: Diagnosis not present

## 2019-12-20 DIAGNOSIS — I10 Essential (primary) hypertension: Secondary | ICD-10-CM | POA: Diagnosis not present

## 2019-12-20 DIAGNOSIS — E782 Mixed hyperlipidemia: Secondary | ICD-10-CM | POA: Diagnosis not present

## 2019-12-25 ENCOUNTER — Other Ambulatory Visit: Payer: Self-pay

## 2019-12-25 ENCOUNTER — Other Ambulatory Visit (INDEPENDENT_AMBULATORY_CARE_PROVIDER_SITE_OTHER): Payer: Self-pay | Admitting: Internal Medicine

## 2019-12-25 ENCOUNTER — Ambulatory Visit (INDEPENDENT_AMBULATORY_CARE_PROVIDER_SITE_OTHER): Payer: Medicare PPO | Admitting: Podiatry

## 2019-12-25 DIAGNOSIS — L97512 Non-pressure chronic ulcer of other part of right foot with fat layer exposed: Secondary | ICD-10-CM

## 2019-12-25 DIAGNOSIS — E1149 Type 2 diabetes mellitus with other diabetic neurological complication: Secondary | ICD-10-CM

## 2019-12-27 DIAGNOSIS — E782 Mixed hyperlipidemia: Secondary | ICD-10-CM | POA: Diagnosis not present

## 2019-12-27 DIAGNOSIS — I10 Essential (primary) hypertension: Secondary | ICD-10-CM | POA: Diagnosis not present

## 2019-12-27 DIAGNOSIS — E1142 Type 2 diabetes mellitus with diabetic polyneuropathy: Secondary | ICD-10-CM | POA: Diagnosis not present

## 2019-12-27 DIAGNOSIS — Z4781 Encounter for orthopedic aftercare following surgical amputation: Secondary | ICD-10-CM | POA: Diagnosis not present

## 2019-12-27 DIAGNOSIS — Z4801 Encounter for change or removal of surgical wound dressing: Secondary | ICD-10-CM | POA: Diagnosis not present

## 2019-12-27 DIAGNOSIS — I69354 Hemiplegia and hemiparesis following cerebral infarction affecting left non-dominant side: Secondary | ICD-10-CM | POA: Diagnosis not present

## 2019-12-27 DIAGNOSIS — K219 Gastro-esophageal reflux disease without esophagitis: Secondary | ICD-10-CM | POA: Diagnosis not present

## 2019-12-27 DIAGNOSIS — M86171 Other acute osteomyelitis, right ankle and foot: Secondary | ICD-10-CM | POA: Diagnosis not present

## 2019-12-27 DIAGNOSIS — E1169 Type 2 diabetes mellitus with other specified complication: Secondary | ICD-10-CM | POA: Diagnosis not present

## 2019-12-31 DIAGNOSIS — I10 Essential (primary) hypertension: Secondary | ICD-10-CM | POA: Diagnosis not present

## 2019-12-31 DIAGNOSIS — Z4801 Encounter for change or removal of surgical wound dressing: Secondary | ICD-10-CM | POA: Diagnosis not present

## 2019-12-31 DIAGNOSIS — M86171 Other acute osteomyelitis, right ankle and foot: Secondary | ICD-10-CM | POA: Diagnosis not present

## 2019-12-31 DIAGNOSIS — I69354 Hemiplegia and hemiparesis following cerebral infarction affecting left non-dominant side: Secondary | ICD-10-CM | POA: Diagnosis not present

## 2019-12-31 DIAGNOSIS — E1169 Type 2 diabetes mellitus with other specified complication: Secondary | ICD-10-CM | POA: Diagnosis not present

## 2019-12-31 DIAGNOSIS — K219 Gastro-esophageal reflux disease without esophagitis: Secondary | ICD-10-CM | POA: Diagnosis not present

## 2019-12-31 DIAGNOSIS — E782 Mixed hyperlipidemia: Secondary | ICD-10-CM | POA: Diagnosis not present

## 2019-12-31 DIAGNOSIS — Z4781 Encounter for orthopedic aftercare following surgical amputation: Secondary | ICD-10-CM | POA: Diagnosis not present

## 2019-12-31 DIAGNOSIS — E1142 Type 2 diabetes mellitus with diabetic polyneuropathy: Secondary | ICD-10-CM | POA: Diagnosis not present

## 2020-01-01 ENCOUNTER — Other Ambulatory Visit: Payer: Self-pay

## 2020-01-01 ENCOUNTER — Ambulatory Visit (INDEPENDENT_AMBULATORY_CARE_PROVIDER_SITE_OTHER): Payer: Medicare PPO | Admitting: Podiatry

## 2020-01-01 ENCOUNTER — Other Ambulatory Visit: Payer: Medicare PPO | Admitting: Orthotics

## 2020-01-01 VITALS — Temp 96.5°F

## 2020-01-01 DIAGNOSIS — K219 Gastro-esophageal reflux disease without esophagitis: Secondary | ICD-10-CM | POA: Diagnosis not present

## 2020-01-01 DIAGNOSIS — E1169 Type 2 diabetes mellitus with other specified complication: Secondary | ICD-10-CM | POA: Diagnosis not present

## 2020-01-01 DIAGNOSIS — E1149 Type 2 diabetes mellitus with other diabetic neurological complication: Secondary | ICD-10-CM

## 2020-01-01 DIAGNOSIS — Z4801 Encounter for change or removal of surgical wound dressing: Secondary | ICD-10-CM | POA: Diagnosis not present

## 2020-01-01 DIAGNOSIS — E1142 Type 2 diabetes mellitus with diabetic polyneuropathy: Secondary | ICD-10-CM | POA: Diagnosis not present

## 2020-01-01 DIAGNOSIS — I10 Essential (primary) hypertension: Secondary | ICD-10-CM | POA: Diagnosis not present

## 2020-01-01 DIAGNOSIS — M86171 Other acute osteomyelitis, right ankle and foot: Secondary | ICD-10-CM | POA: Diagnosis not present

## 2020-01-01 DIAGNOSIS — Z4781 Encounter for orthopedic aftercare following surgical amputation: Secondary | ICD-10-CM | POA: Diagnosis not present

## 2020-01-01 DIAGNOSIS — L97512 Non-pressure chronic ulcer of other part of right foot with fat layer exposed: Secondary | ICD-10-CM

## 2020-01-01 DIAGNOSIS — I69354 Hemiplegia and hemiparesis following cerebral infarction affecting left non-dominant side: Secondary | ICD-10-CM | POA: Diagnosis not present

## 2020-01-01 DIAGNOSIS — E782 Mixed hyperlipidemia: Secondary | ICD-10-CM | POA: Diagnosis not present

## 2020-01-01 NOTE — Progress Notes (Signed)
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  Her husband as well as home health been changing the bandage and the wound is doing much better.  Denies any change or pus any swelling or redness.  No warmth of the foot.  No new concerns.  Denies any fevers, chills, nausea, vomiting.  No calf pain, chest pain, shortness of breath.    Objective: AAO x3, NAD DP/PT pulses palpable bilaterally, CRT less than 3 seconds Wound is doing substantially better.  Is almost completely closed today.  Is almost very thin/narrow wound measuring approximately 6 x 0.2 cm.  There is no surrounding erythema, ascending cellulitis.  Is no fluctuation crepitation peer there is no malodor. 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 -Wound is doing well.  Continue with Santyl dressing changes daily.  Debrided some of the tissue down to healthy, bleeding, granular tissue that any complications utilize #312 with scalpel. -Continue offloading.  Awaiting diabetic insert  Return in about 1 week   Vivi Barrack DPM

## 2020-01-05 ENCOUNTER — Other Ambulatory Visit (INDEPENDENT_AMBULATORY_CARE_PROVIDER_SITE_OTHER): Payer: Self-pay | Admitting: Internal Medicine

## 2020-01-05 ENCOUNTER — Other Ambulatory Visit (INDEPENDENT_AMBULATORY_CARE_PROVIDER_SITE_OTHER): Payer: Self-pay | Admitting: Nurse Practitioner

## 2020-01-06 NOTE — Progress Notes (Signed)
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  She states that she is doing much better and she is very happy with the progress of the wound.  They think the wound is almost completely healed.  Denies any swelling or redness or any drainage or pus.  No new concerns today. Denies any fevers, chills, nausea, vomiting.  No calf pain, chest pain, shortness of breath.    Objective: AAO x3, NAD DP/PT pulses palpable bilaterally, CRT less than 3 seconds There was hyperkeratotic tissue on the incision.  Upon debridement appears the wound is almost completely healed except for a very small area on the proximal aspect of the sesamoids where the scab has come off and superficial wound is present.  There is no history of erythema, ascending cellulitis.  There is no fluctuance or crepitation.  There is no malodor. 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 -I debrided the hyperkeratotic tissue.  Only small superficial wound still evident.  Will finish with Santyl.  Continue daily dressing changes.  Awaiting diabetic shoes, inserts.  No signs of infection but monitor closely still.  I am pleased with the progress of the wound so far.  Return in about 1 week (around 01/08/2020).  Vivi Barrack DPM

## 2020-01-07 ENCOUNTER — Other Ambulatory Visit: Payer: Self-pay

## 2020-01-07 ENCOUNTER — Ambulatory Visit (INDEPENDENT_AMBULATORY_CARE_PROVIDER_SITE_OTHER): Payer: Medicare PPO | Admitting: Podiatry

## 2020-01-07 VITALS — Temp 95.1°F

## 2020-01-07 DIAGNOSIS — E1149 Type 2 diabetes mellitus with other diabetic neurological complication: Secondary | ICD-10-CM

## 2020-01-07 DIAGNOSIS — L97512 Non-pressure chronic ulcer of other part of right foot with fat layer exposed: Secondary | ICD-10-CM

## 2020-01-08 NOTE — Progress Notes (Signed)
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  She states that she has been doing well.  Her husband's been change the bandage daily.  Denies any difficulty feel the wound is healed.  No swelling or redness of the foot.  She has no new concerns.  No recent injury or falls.  Still awaiting diabetic shoes, inserts. Denies any fevers, chills, nausea, vomiting.  No calf pain, chest pain, shortness of breath.    Objective: AAO x3, NAD DP/PT pulses palpable bilaterally, CRT less than 3 seconds There was hyperkeratotic tissue on the incision on the distal aspect and upon debridement part of the incision is well-healed at this time there is no open sores identified.  There is no surrounding erythema, [there is no fluctuation crepitation.  There is no malodor.  No other open lesions identified today. 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 -Debrided hyperkeratotic tissue on the incision with any complications.  There is the wound is healed.  At this point recommend moisturizer at nighttime itching keep a bandage on it during the week day for protection.  Encouraged elevation.  Awaiting shoe, inserts.  Vivi Barrack DPM

## 2020-01-09 DIAGNOSIS — Z4781 Encounter for orthopedic aftercare following surgical amputation: Secondary | ICD-10-CM | POA: Diagnosis not present

## 2020-01-09 DIAGNOSIS — I69354 Hemiplegia and hemiparesis following cerebral infarction affecting left non-dominant side: Secondary | ICD-10-CM | POA: Diagnosis not present

## 2020-01-09 DIAGNOSIS — E1142 Type 2 diabetes mellitus with diabetic polyneuropathy: Secondary | ICD-10-CM | POA: Diagnosis not present

## 2020-01-09 DIAGNOSIS — M86171 Other acute osteomyelitis, right ankle and foot: Secondary | ICD-10-CM | POA: Diagnosis not present

## 2020-01-09 DIAGNOSIS — E782 Mixed hyperlipidemia: Secondary | ICD-10-CM | POA: Diagnosis not present

## 2020-01-09 DIAGNOSIS — K219 Gastro-esophageal reflux disease without esophagitis: Secondary | ICD-10-CM | POA: Diagnosis not present

## 2020-01-09 DIAGNOSIS — E1169 Type 2 diabetes mellitus with other specified complication: Secondary | ICD-10-CM | POA: Diagnosis not present

## 2020-01-09 DIAGNOSIS — I10 Essential (primary) hypertension: Secondary | ICD-10-CM | POA: Diagnosis not present

## 2020-01-09 DIAGNOSIS — Z4801 Encounter for change or removal of surgical wound dressing: Secondary | ICD-10-CM | POA: Diagnosis not present

## 2020-01-17 ENCOUNTER — Ambulatory Visit: Payer: Medicare PPO | Admitting: Podiatry

## 2020-01-22 ENCOUNTER — Other Ambulatory Visit: Payer: Self-pay

## 2020-01-22 ENCOUNTER — Ambulatory Visit (INDEPENDENT_AMBULATORY_CARE_PROVIDER_SITE_OTHER): Payer: Medicare PPO | Admitting: Podiatry

## 2020-01-22 DIAGNOSIS — L97512 Non-pressure chronic ulcer of other part of right foot with fat layer exposed: Secondary | ICD-10-CM

## 2020-01-22 DIAGNOSIS — E1149 Type 2 diabetes mellitus with other diabetic neurological complication: Secondary | ICD-10-CM

## 2020-01-23 NOTE — Progress Notes (Signed)
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  She states that she is doing well and she believes that the wound has healed.  Denies any increase in swelling or redness.  No drainage.  They have been keeping Santyl on the area daily.  Awaiting diabetic shoes, inserts denies any fevers, chills, nausea, vomiting.  No calf pain, chest pain, shortness of breath.    Objective: AAO x3, NAD DP/PT pulses palpable bilaterally, CRT less than 3 seconds At this time there is a scar overlying the area.  There is macerated tissue present as they have been keeping Santyl on a closed wound.  There is no surrounding erythema, ascending cellulitis.  No fluctuation crepitation peer there is no malodor. No other open lesions identified today. 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 -Appears that the incision is healed.  Recommend holding off on any Santyl.  Keep area clean and dry for now.  Awaiting diabetic shoes, inserts until this arrives appointment in surgical shoe.  Continue to elevate to help with swelling.  Return in about 3 weeks (around 02/12/2020).  Vivi Barrack DPM

## 2020-02-04 ENCOUNTER — Other Ambulatory Visit (INDEPENDENT_AMBULATORY_CARE_PROVIDER_SITE_OTHER): Payer: Self-pay | Admitting: Internal Medicine

## 2020-02-12 ENCOUNTER — Ambulatory Visit (INDEPENDENT_AMBULATORY_CARE_PROVIDER_SITE_OTHER): Payer: Medicare PPO | Admitting: Orthotics

## 2020-02-12 ENCOUNTER — Other Ambulatory Visit: Payer: Self-pay

## 2020-02-12 ENCOUNTER — Ambulatory Visit: Payer: Medicare PPO | Admitting: Podiatry

## 2020-02-12 DIAGNOSIS — L97512 Non-pressure chronic ulcer of other part of right foot with fat layer exposed: Secondary | ICD-10-CM

## 2020-02-12 DIAGNOSIS — Z899 Acquired absence of limb, unspecified: Secondary | ICD-10-CM

## 2020-02-12 DIAGNOSIS — E1149 Type 2 diabetes mellitus with other diabetic neurological complication: Secondary | ICD-10-CM

## 2020-02-13 NOTE — Progress Notes (Signed)
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 She states that the foot is doing well. She has some callus but denies any open lesion. No drainage or pus. No increase in swelling. No pain. Also presents to pick up shoes/inserts with Raiford Noble. Denies any fevers, chills, nausea, vomiting.  No calf pain, chest pain, shortness of breath.    Objective: AAO x3, NAD DP/PT pulses palpable bilaterally, CRT less than 3 seconds At this time there is a scar overlying the area with mild hyperkeratotic tissue. There is no underlying ulceration, drainage, signs of infection. No macerated tissue. No other areas of skin breakdown.  No other open lesions identified today. 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 -Debrided hyperkeratotic tissue without any complications or bleeding.  Recommend a small meta moisturizer to the area daily. -Rick dispensed shoes, inserts today.  Discussed break-in instructions. -Monitoring areas of skin breakdown or any other new issues.o  Return in about 4 weeks (around 03/11/2020).  Vivi Barrack DPM

## 2020-02-17 ENCOUNTER — Other Ambulatory Visit (INDEPENDENT_AMBULATORY_CARE_PROVIDER_SITE_OTHER): Payer: Self-pay | Admitting: Internal Medicine

## 2020-02-29 ENCOUNTER — Other Ambulatory Visit (INDEPENDENT_AMBULATORY_CARE_PROVIDER_SITE_OTHER): Payer: Self-pay | Admitting: Internal Medicine

## 2020-03-03 ENCOUNTER — Other Ambulatory Visit (INDEPENDENT_AMBULATORY_CARE_PROVIDER_SITE_OTHER): Payer: Self-pay | Admitting: Internal Medicine

## 2020-03-03 DIAGNOSIS — N3941 Urge incontinence: Secondary | ICD-10-CM

## 2020-03-06 ENCOUNTER — Other Ambulatory Visit: Payer: Self-pay

## 2020-03-06 ENCOUNTER — Encounter (INDEPENDENT_AMBULATORY_CARE_PROVIDER_SITE_OTHER): Payer: Self-pay | Admitting: Internal Medicine

## 2020-03-06 ENCOUNTER — Ambulatory Visit (INDEPENDENT_AMBULATORY_CARE_PROVIDER_SITE_OTHER): Payer: Medicare PPO | Admitting: Internal Medicine

## 2020-03-06 VITALS — BP 138/89 | HR 75 | Temp 97.6°F | Resp 18 | Ht 67.0 in | Wt 231.0 lb

## 2020-03-06 DIAGNOSIS — E1165 Type 2 diabetes mellitus with hyperglycemia: Secondary | ICD-10-CM | POA: Diagnosis not present

## 2020-03-06 DIAGNOSIS — E782 Mixed hyperlipidemia: Secondary | ICD-10-CM | POA: Diagnosis not present

## 2020-03-06 DIAGNOSIS — I10 Essential (primary) hypertension: Secondary | ICD-10-CM | POA: Diagnosis not present

## 2020-03-06 NOTE — Progress Notes (Signed)
Metrics: Intervention Frequency ACO  Documented Smoking Status Yearly  Screened one or more times in 24 months  Cessation Counseling or  Active cessation medication Past 24 months  Past 24 months   Guideline developer: UpToDate (See UpToDate for funding source) Date Released: 2014       Wellness Office Visit  Subjective:  Patient ID: Melanie Spencer, female    DOB: February 09, 1961  Age: 59 y.o. MRN: [id number]  CC: This lady comes in for follow-up of diabetes, hypertension, obesity. HPI  Overall, she has done very well with her diabetes now and has reduced her insulin from 50 units daily to 40 units daily and has tolerated the higher dose of Trulicity. She is eating animal protein approximately 3 times a week now and is focusing on vegetables most of the time. She is finding that her blood sugars are dropping and she has had to reduce her insulin. She has been following up with podiatry and everything seems to be healing well and her foot. Past Medical History:  Diagnosis Date  . Cerebrovascular disease 05/23/2019  . Essential hypertension, benign 05/23/2019  . History of stroke 2007   left leg weakness  . HLD (hyperlipidemia) 05/23/2019  . Morbid obesity ([place]) 05/23/2019  . Type II diabetes mellitus, uncontrolled ([place]) 05/23/2019  . Vitamin D deficiency    Past Surgical History:  Procedure Laterality Date  . AMPUTATION Right 11/09/2019   Procedure: 5TH AMPUTATION RAY;  Surgeon: Trula Slade, DPM;  Location: [facility];  Service: Podiatry;  Laterality: Right;  . CHOLECYSTECTOMY       Family History  Problem Relation Age of Onset  . Heart disease Mother   . Diabetes Mother   . Hypertension Mother   . Cancer Father        Lung  . Hypertension Sister   . Macular degeneration Sister   . Appendicitis Brother   . Hypertension Sister   . Diabetes Sister     Social History   Social History Narrative   Married for 31 yrs.On disability secondary to CVA.Previously licensed  Medical illustrator.   Social History   Tobacco Use  . Smoking status: Former Smoker    Packs/day: 2.00    Years: 20.00    Pack years: 40.00    Types: Cigarettes    Quit date: 07/26/2005    Years since quitting: 14.6  . Smokeless tobacco: Never Used  Substance Use Topics  . Alcohol use: No    Alcohol/week: 0.0 standard drinks    Current Meds  Medication Sig  . ACCU-CHEK GUIDE test strip   . aspirin 325 MG tablet Take 325 mg by mouth daily.  . Blood Glucose Monitoring Suppl (ACCU-CHEK GUIDE ME) w/Device KIT   . Blood Pressure Monitoring (BLOOD PRESSURE MONITOR AUTOMAT) DEVI Check blood pressure at home daily  . cholecalciferol (VITAMIN D) 1000 UNITS tablet Take 1,000 Units by mouth daily.   . collagenase (SANTYL) ointment Apply 1 application topically daily.  . Dulaglutide (TRULICITY) 1.5 KP/5.3ZS SOPN Inject 1.5 mg into the skin once a week.  Marland Kitchen LANTUS SOLOSTAR 100 UNIT/ML Solostar Pen ADMINISTER 75 UNITS UNDER THE SKIN AT BEDTIME (Patient taking differently: Inject 40 Units into the skin at bedtime. )  . metFORMIN (GLUCOPHAGE) 1000 MG tablet TAKE 1 TABLET BY MOUTH TWICE DAILY  . omeprazole (PRILOSEC) 20 MG capsule TAKE 1 CAPSULE BY MOUTH DAILY  . oxybutynin (DITROPAN) 5 MG tablet TAKE 1 TABLET(5 MG) BY MOUTH THREE TIMES DAILY  . ramipril (ALTACE)  10 MG capsule TAKE 2 CAPSULES(20 MG) BY MOUTH DAILY  . simvastatin (ZOCOR) 40 MG tablet Take 1 tablet (40 mg total) by mouth at bedtime.  . TRULICITY 9.56 LO/7.5IE SOPN ADMINISTER 0.75 MG UNDER THE SKIN 1 TIME A WEEK (Patient taking differently: Inject 0.75 mg into the skin every Sunday. )      Depression screen [facility] 2/9 08/02/2019 06/19/2019 07/11/2018  Decreased Interest 0 0 0  Down, Depressed, Hopeless 0 0 0  PHQ - 2 Score 0 0 0     Objective:   Today's Vitals: BP 138/89 (BP Location: Right Arm, Patient Position: Sitting, Cuff Size: Normal)   Pulse 75   Temp 97.6 F (36.4 C) (Temporal)   Resp 18   Ht 5' 7"  (1.702 m)   Wt 231 lb  (104.8 kg)   LMP 03/02/2012   SpO2 98%   BMI 36.18 kg/m  Vitals with BMI 03/06/2020 11/28/2019 11/10/2019  Height 5' 7"  5' 7"  -  Weight 231 lbs 233 lbs 6 oz -  BMI 33.29 51.88 -  Systolic 416 606 301  Diastolic 89 72 52  Pulse 75 82 66     Physical Exam   She looks systemically well.  She has lost a couple of pounds since the last visit.  Blood pressure borderline elevated.    Assessment   1. Essential hypertension, benign   2. Uncontrolled type 2 diabetes mellitus with hyperglycemia ([place])   3. Mixed hyperlipidemia       Tests ordered Orders Placed This Encounter  Procedures  . CBC  . COMPLETE METABOLIC PANEL WITH GFR  . Hemoglobin A1c  . Lipid panel     Plan: 1. She will continue with ACE inhibitor for her hypertension.  This seems to be controlling her blood pressure reasonably well. 2. She will continue with Trulicity and insulin as well as Metformin for diabetes and hopefully we can reduce the insulin further. 3. She will continue with statin therapy for hyperlipidemia in the face of diabetes and we will check a lipid panel. 4. Again today had a conversation regarding COVID-19 vaccination and I have urged her and her husband to get vaccinated in light of the delta variant. 5. Further recommendations will depend on blood results and I will see her in 3 months time for follow-up.   No orders of the defined types were placed in this encounter.   Doree Albee, MD

## 2020-03-07 LAB — HEMOGLOBIN A1C
Hgb A1c MFr Bld: 8.3 % of total Hgb — ABNORMAL HIGH (ref <5.7)
Mean Plasma Glucose: 192 (calc)
eAG (mmol/L): 10.6 (calc)

## 2020-03-07 LAB — COMPLETE METABOLIC PANEL WITH GFR
AG Ratio: 1.3 (calc) (ref 1.0–2.5)
ALT: 12 U/L (ref 6–29)
AST: 13 U/L (ref 10–35)
Albumin: 4 g/dL (ref 3.6–5.1)
Alkaline phosphatase (APISO): 95 U/L (ref 37–153)
BUN: 16 mg/dL (ref 7–25)
CO2: 30 mmol/L (ref 20–32)
Calcium: 9.6 mg/dL (ref 8.6–10.4)
Chloride: 101 mmol/L (ref 98–110)
Creat: 0.97 mg/dL (ref 0.50–1.05)
GFR, Est African American: 74 mL/min/{1.73_m2} (ref >=60)
GFR, Est Non African American: 64 mL/min/{1.73_m2} (ref >=60)
Globulin: 3.1 g/dL (calc) (ref 1.9–3.7)
Glucose, Bld: 165 mg/dL — ABNORMAL HIGH (ref 65–99)
Potassium: 4.9 mmol/L (ref 3.5–5.3)
Sodium: 138 mmol/L (ref 135–146)
Total Bilirubin: 0.4 mg/dL (ref 0.2–1.2)
Total Protein: 7.1 g/dL (ref 6.1–8.1)

## 2020-03-07 LAB — LIPID PANEL
Cholesterol: 106 mg/dL (ref <200)
HDL: 36 mg/dL — ABNORMAL LOW (ref >=50)
LDL Cholesterol (Calc): 45 mg/dL (calc)
Non-HDL Cholesterol (Calc): 70 mg/dL (calc) (ref <130)
Total CHOL/HDL Ratio: 2.9 (calc) (ref <5.0)
Triglycerides: 173 mg/dL — ABNORMAL HIGH (ref <150)

## 2020-03-07 LAB — CBC
HCT: 36 % (ref 35.0–45.0)
Hemoglobin: 11.6 g/dL — ABNORMAL LOW (ref 11.7–15.5)
MCH: 27.3 pg (ref 27.0–33.0)
MCHC: 32.2 g/dL (ref 32.0–36.0)
MCV: 84.7 fL (ref 80.0–100.0)
MPV: 9.8 fL (ref 7.5–12.5)
Platelets: 309 10*3/uL (ref 140–400)
RBC: 4.25 10*6/uL (ref 3.80–5.10)
RDW: 14.6 % (ref 11.0–15.0)
WBC: 9.9 10*3/uL (ref 3.8–10.8)

## 2020-03-11 NOTE — Progress Notes (Signed)
Pt was called and given lab result of A1c. Pt was happy to hear it was better. She siad she will do her best to keep it up. She is feeling much better.

## 2020-03-11 NOTE — Progress Notes (Signed)
Please let the patient know that diabetes continues to improve and now her hemoglobin A1c is 8.3%, down from 9.3% 4 months ago.  Great job!  Keep up the good work.

## 2020-03-13 ENCOUNTER — Ambulatory Visit: Payer: Medicare PPO | Admitting: Podiatry

## 2020-03-13 ENCOUNTER — Other Ambulatory Visit: Payer: Self-pay

## 2020-03-13 DIAGNOSIS — M2042 Other hammer toe(s) (acquired), left foot: Secondary | ICD-10-CM | POA: Diagnosis not present

## 2020-03-13 DIAGNOSIS — Z899 Acquired absence of limb, unspecified: Secondary | ICD-10-CM

## 2020-03-13 DIAGNOSIS — M2032 Hallux varus (acquired), left foot: Secondary | ICD-10-CM

## 2020-03-13 NOTE — Progress Notes (Signed)
  Subjective:  Patient ID: Melanie Spencer, female    DOB: [birth year],  MRN: [id number]  Chief Complaint  Patient presents with  . Foot Ulcer    right foot ulcer, 4 week follow up    59 y.o. female presents with the above complaint. History confirmed with patient.  They are to follow-up from the healing of the ulceration from the surgical site.  She also has a complaint of pain in the left fourth toe distal tip.  Objective:  Physical Exam: Right foot incision is well-healed without any signs of infection or wound dehiscence or recurrence.  On the left foot she has semirigid contracted hammertoes, these are reducible and appear to be spastic in nature secondary to her previous stroke.      Assessment:   1. History of amputation   2. Hammertoe of left foot      Plan:  Patient was evaluated and treated and all questions answered.   -Okay to continue bathing and putting lotion on the right foot incision.  Continue to monitor for signs of worsening, recurrence, or infection -For the left foot she does have contracted hammertoes which appear to be spastic in nature, she has hallux malleus here as well.  This appears to be putting pressure on the distal tip of the toe and toenail and is causing pain for her.  I dispensed a toe crest and she will try to wear this to see if it alleviates pressure.  If is not successful we would consider flexor tenotomy's of the fourth toe, potentially all the toes if indicated.  Return in about 8 weeks (around 05/08/2020) for with Dr Ardelle Anton.

## 2020-03-18 ENCOUNTER — Telehealth (INDEPENDENT_AMBULATORY_CARE_PROVIDER_SITE_OTHER): Payer: Self-pay | Admitting: Internal Medicine

## 2020-03-18 ENCOUNTER — Other Ambulatory Visit (INDEPENDENT_AMBULATORY_CARE_PROVIDER_SITE_OTHER): Payer: Self-pay | Admitting: Internal Medicine

## 2020-03-18 ENCOUNTER — Telehealth (INDEPENDENT_AMBULATORY_CARE_PROVIDER_SITE_OTHER): Payer: Self-pay

## 2020-03-18 MED ORDER — RAMIPRIL 10 MG PO CAPS: ORAL_CAPSULE | ORAL | 0 refills | Status: DC

## 2020-03-19 ENCOUNTER — Telehealth (INDEPENDENT_AMBULATORY_CARE_PROVIDER_SITE_OTHER): Payer: Self-pay

## 2020-03-19 ENCOUNTER — Other Ambulatory Visit (INDEPENDENT_AMBULATORY_CARE_PROVIDER_SITE_OTHER): Payer: Self-pay | Admitting: Internal Medicine

## 2020-03-19 DIAGNOSIS — N390 Urinary tract infection, site not specified: Secondary | ICD-10-CM

## 2020-03-19 NOTE — Telephone Encounter (Signed)
Done

## 2020-03-19 NOTE — Telephone Encounter (Signed)
Okay, I have put the order in now.

## 2020-03-19 NOTE — Telephone Encounter (Signed)
She will need a 15-minute office appointment to assess her symptoms.

## 2020-03-19 NOTE — Telephone Encounter (Signed)
Can you add her in for Thurs as a acute virtual.11:45 or 8:00am

## 2020-03-19 NOTE — Telephone Encounter (Signed)
I will call her now. 

## 2020-03-19 NOTE — Telephone Encounter (Signed)
Typo error. Pt husband brought over urine for testing.

## 2020-03-19 NOTE — Telephone Encounter (Signed)
Can we do a telemed?

## 2020-03-19 NOTE — Telephone Encounter (Signed)
No.  She will need to come in because we need a urine sample.  If she cannot come in, she can definitely go to an urgent care center.

## 2020-03-20 ENCOUNTER — Encounter (INDEPENDENT_AMBULATORY_CARE_PROVIDER_SITE_OTHER): Payer: Self-pay | Admitting: Internal Medicine

## 2020-03-20 ENCOUNTER — Telehealth (INDEPENDENT_AMBULATORY_CARE_PROVIDER_SITE_OTHER): Payer: Medicare PPO | Admitting: Internal Medicine

## 2020-03-20 DIAGNOSIS — N390 Urinary tract infection, site not specified: Secondary | ICD-10-CM | POA: Diagnosis not present

## 2020-03-20 MED ORDER — CIPROFLOXACIN HCL 500 MG PO TABS: 500.0000 mg | ORAL_TABLET | Freq: Two times a day (BID) | ORAL | 0 refills | Status: DC

## 2020-03-20 NOTE — Progress Notes (Signed)
Metrics: Intervention Frequency ACO  Documented Smoking Status Yearly  Screened one or more times in 24 months  Cessation Counseling or  Active cessation medication Past 24 months  Past 24 months   Guideline developer: UpToDate (See UpToDate for funding source) Date Released: 2014       Wellness Office Visit  Subjective:  Patient ID: Melanie Spencer, female    DOB: 1961-06-21  Age: 59 y.o. MRN: [id number]  CC: This is an audio telemedicine visit with the permission of the patient who is at home and I am in my office.  I was able to identify the patient using 2 identifiers. Urinary hesitancy and lower abdominal pressure. HPI  She has had the above symptoms for the last few days.  She has no nausea or vomiting but she has increased frequency of micturition.  She thinks she has a UTI.  She is diabetic. Past Medical History:  Diagnosis Date  . Cerebrovascular disease 05/23/2019  . Essential hypertension, benign 05/23/2019  . History of stroke 2007   left leg weakness  . HLD (hyperlipidemia) 05/23/2019  . Morbid obesity ([place]) 05/23/2019  . Type II diabetes mellitus, uncontrolled ([place]) 05/23/2019  . Vitamin D deficiency    Past Surgical History:  Procedure Laterality Date  . AMPUTATION Right 11/09/2019   Procedure: 5TH AMPUTATION RAY;  Surgeon: Trula Slade, DPM;  Location: [facility];  Service: Podiatry;  Laterality: Right;  . CHOLECYSTECTOMY       Family History  Problem Relation Age of Onset  . Heart disease Mother   . Diabetes Mother   . Hypertension Mother   . Cancer Father        Lung  . Hypertension Sister   . Macular degeneration Sister   . Appendicitis Brother   . Hypertension Sister   . Diabetes Sister     Social History   Social History Narrative   Married for 31 yrs.On disability secondary to CVA.Previously licensed Medical illustrator.   Social History   Tobacco Use  . Smoking status: Former Smoker    Packs/day: 2.00    Years: 20.00    Pack years: 40.00     Types: Cigarettes    Quit date: 07/26/2005    Years since quitting: 14.6  . Smokeless tobacco: Never Used  Substance Use Topics  . Alcohol use: No    Alcohol/week: 0.0 standard drinks    Current Meds  Medication Sig  . ACCU-CHEK GUIDE test strip   . amLODipine (NORVASC) 2.5 MG tablet   . aspirin 325 MG tablet Take 325 mg by mouth daily.  . Blood Glucose Monitoring Suppl (ACCU-CHEK GUIDE ME) w/Device KIT   . Blood Pressure Monitoring (BLOOD PRESSURE MONITOR AUTOMAT) DEVI Check blood pressure at home daily  . cholecalciferol (VITAMIN D) 1000 UNITS tablet Take 1,000 Units by mouth daily.   . ciprofloxacin (CIPRO) 500 MG tablet Take 1 tablet (500 mg total) by mouth 2 (two) times daily.  . collagenase (SANTYL) ointment Apply 1 application topically daily.  . Dulaglutide (TRULICITY) 1.5 FM/3.8GY SOPN Inject 1.5 mg into the skin once a week.  Marland Kitchen LANTUS SOLOSTAR 100 UNIT/ML Solostar Pen ADMINISTER 75 UNITS UNDER THE SKIN AT BEDTIME (Patient taking differently: Inject 40 Units into the skin at bedtime. )  . metFORMIN (GLUCOPHAGE) 1000 MG tablet TAKE 1 TABLET BY MOUTH TWICE DAILY  . omeprazole (PRILOSEC) 20 MG capsule TAKE 1 CAPSULE BY MOUTH DAILY  . oxybutynin (DITROPAN) 5 MG tablet TAKE 1 TABLET(5 MG) BY MOUTH  THREE TIMES DAILY  . ramipril (ALTACE) 10 MG capsule TAKE 2 CAPSULES(20 MG) BY MOUTH DAILY  . simvastatin (ZOCOR) 40 MG tablet Take 1 tablet (40 mg total) by mouth at bedtime.  . TRULICITY 4.46 FE/0.7OL SOPN ADMINISTER 0.75 MG UNDER THE SKIN 1 TIME A WEEK (Patient taking differently: Inject 0.75 mg into the skin every Sunday. )       Depression screen [facility] 2/9 08/02/2019 06/19/2019 07/11/2018  Decreased Interest 0 0 0  Down, Depressed, Hopeless 0 0 0  PHQ - 2 Score 0 0 0     Objective:   Today's Vitals: LMP 03/02/2012  Vitals with BMI 03/20/2020 03/06/2020 11/28/2019  Height (No Data) 5' 7"  5' 7"   Weight (No Data) 231 lbs 233 lbs 6 oz  BMI - 91.55 02.71  Systolic (No Data) 423  200  Diastolic (No Data) 89 72  Pulse - 75 82     Physical Exam  Her speech on the phone appears to be normal and she is alert and orientated.     Assessment   1. Urinary tract infection without hematuria, site unspecified       Tests ordered No orders of the defined types were placed in this encounter.    Plan: 1. We have already sent the urine for culture and we will see what the result is but in the meantime I am going to start her on ciprofloxacin for 3-day course based on clinical grounds. 2. This phone call lasted approximately 5 minutes.   Meds ordered this encounter  Medications  . ciprofloxacin (CIPRO) 500 MG tablet    Sig: Take 1 tablet (500 mg total) by mouth 2 (two) times daily.    Dispense:  6 tablet    Refill:  0    Angeleigh Chiasson Luther Parody, MD

## 2020-03-22 LAB — URINALYSIS W MICROSCOPIC + REFLEX CULTURE
Bilirubin Urine: NEGATIVE
Hgb urine dipstick: NEGATIVE
Hyaline Cast: NONE SEEN /LPF
Ketones, ur: NEGATIVE
Nitrites, Initial: NEGATIVE
RBC / HPF: NONE SEEN /HPF (ref 0–2)
Specific Gravity, Urine: 1.021 (ref 1.001–1.03)
Squamous Epithelial / HPF: NONE SEEN /HPF (ref <=5)
WBC, UA: >=60 /HPF — AB (ref 0–5)
pH: <=5 (ref 5.0–8.0)

## 2020-03-22 LAB — CULTURE INDICATED

## 2020-03-22 LAB — URINE CULTURE

## 2020-03-24 ENCOUNTER — Telehealth (INDEPENDENT_AMBULATORY_CARE_PROVIDER_SITE_OTHER): Payer: Self-pay

## 2020-03-24 NOTE — Telephone Encounter (Signed)
-----   Message from [name], MD sent at 03/24/2020 10:14 AM EDT ----- Please call the patient and tell her that she did have a urinary tract infection and the antibiotic that I gave her should be effective against the bacteria that was grown.If she continues to have symptoms after she has finished the course of antibiotics, she should let us know.

## 2020-03-24 NOTE — Progress Notes (Signed)
Please call the patient and tell her that she did have a urinary tract infection and the antibiotic that I gave her should be effective against the bacteria that was grown.If she continues to have symptoms after she has finished the course of antibiotics, she should let us know.

## 2020-03-24 NOTE — Telephone Encounter (Signed)
Pt was called and notified of the medication for UTI. Husband will pick up today.

## 2020-04-08 ENCOUNTER — Other Ambulatory Visit (INDEPENDENT_AMBULATORY_CARE_PROVIDER_SITE_OTHER): Payer: Self-pay | Admitting: Internal Medicine

## 2020-04-21 NOTE — Progress Notes (Signed)

## 2020-04-22 ENCOUNTER — Telehealth (INDEPENDENT_AMBULATORY_CARE_PROVIDER_SITE_OTHER): Payer: Self-pay

## 2020-04-22 DIAGNOSIS — R300 Dysuria: Secondary | ICD-10-CM

## 2020-04-22 NOTE — Telephone Encounter (Signed)
Called patient and is going to get her husband to swing a sample by before the end of the day.

## 2020-04-22 NOTE — Telephone Encounter (Signed)
This patient called earlier today saying she needs a refill on antibiotic for UTI.  Generally I do not send a refill without rechecking urine sample.  See if she can come in and provide a urine sample and then schedule her for virtual visit if possible with me this week.  If I do not have any openings for virtual visit then I will just determine antibiotic need based on urine sample.

## 2020-04-23 ENCOUNTER — Other Ambulatory Visit (INDEPENDENT_AMBULATORY_CARE_PROVIDER_SITE_OTHER): Payer: Self-pay | Admitting: Nurse Practitioner

## 2020-04-23 DIAGNOSIS — N3001 Acute cystitis with hematuria: Secondary | ICD-10-CM

## 2020-04-23 MED ORDER — NITROFURANTOIN MONOHYD MACRO 100 MG PO CAPS: 100.0000 mg | ORAL_CAPSULE | Freq: Two times a day (BID) | ORAL | 0 refills | Status: DC

## 2020-04-23 NOTE — Telephone Encounter (Signed)
Pt was called and given instructions per Lynwood Dawley. Spouse will go get Rx from [facility] to start today.

## 2020-04-24 LAB — URINALYSIS W MICROSCOPIC + REFLEX CULTURE
Bacteria, UA: NONE SEEN /HPF
Bilirubin Urine: NEGATIVE
Hyaline Cast: NONE SEEN /LPF
Nitrites, Initial: NEGATIVE
Specific Gravity, Urine: 1.026 (ref 1.001–1.03)
pH: <=5 (ref 5.0–8.0)

## 2020-04-24 LAB — URINE CULTURE

## 2020-04-24 LAB — CULTURE INDICATED

## 2020-04-29 ENCOUNTER — Other Ambulatory Visit (HOSPITAL_COMMUNITY): Payer: Self-pay | Admitting: Obstetrics & Gynecology

## 2020-04-29 DIAGNOSIS — Z1231 Encounter for screening mammogram for malignant neoplasm of breast: Secondary | ICD-10-CM

## 2020-05-06 ENCOUNTER — Other Ambulatory Visit (INDEPENDENT_AMBULATORY_CARE_PROVIDER_SITE_OTHER): Payer: Self-pay | Admitting: Nurse Practitioner

## 2020-05-07 ENCOUNTER — Other Ambulatory Visit (INDEPENDENT_AMBULATORY_CARE_PROVIDER_SITE_OTHER): Payer: Self-pay | Admitting: Internal Medicine

## 2020-05-08 ENCOUNTER — Other Ambulatory Visit: Payer: Self-pay

## 2020-05-08 ENCOUNTER — Ambulatory Visit: Payer: Medicare PPO | Admitting: Podiatry

## 2020-05-08 DIAGNOSIS — I739 Peripheral vascular disease, unspecified: Secondary | ICD-10-CM

## 2020-05-08 DIAGNOSIS — E1149 Type 2 diabetes mellitus with other diabetic neurological complication: Secondary | ICD-10-CM | POA: Diagnosis not present

## 2020-05-08 DIAGNOSIS — Z899 Acquired absence of limb, unspecified: Secondary | ICD-10-CM

## 2020-05-08 DIAGNOSIS — M216X1 Other acquired deformities of right foot: Secondary | ICD-10-CM

## 2020-05-10 NOTE — Progress Notes (Signed)
  Subjective:  Patient ID: Melanie Spencer, female    DOB: 24-Jun-1961,  MRN: [id number]  Chief Complaint  Patient presents with  . Foot Ulcer    Right - ulcer has improved     59 y.o. female presents with the above complaint. History confirmed with patient.    Objective:  Physical Exam: Right foot incision is well-healed without any signs of infection or wound dehiscence or recurrence.  Skin has softened without callus formation or skin at risk of ulceration  .      Assessment:   1. History of amputation   2. PAD (peripheral artery disease) (HCC)   3. Prominent metatarsal head of right foot   4. Type II diabetes mellitus with neurological manifestations (HCC)      Plan:  Patient was evaluated and treated and all questions answered.   -Okay to continue bathing and putting lotion on the right foot incision.  Continue to monitor for signs of worsening, recurrence, or infection   Return in about 1 month (around 06/08/2020) for with Dr Ardelle Anton.

## 2020-05-21 ENCOUNTER — Other Ambulatory Visit (INDEPENDENT_AMBULATORY_CARE_PROVIDER_SITE_OTHER): Payer: Self-pay | Admitting: Internal Medicine

## 2020-05-21 DIAGNOSIS — N3941 Urge incontinence: Secondary | ICD-10-CM

## 2020-06-09 ENCOUNTER — Ambulatory Visit: Payer: Medicare PPO | Admitting: Podiatry

## 2020-06-09 ENCOUNTER — Ambulatory Visit (HOSPITAL_COMMUNITY): Payer: Medicare PPO

## 2020-06-09 ENCOUNTER — Other Ambulatory Visit: Payer: Self-pay

## 2020-06-09 DIAGNOSIS — Z899 Acquired absence of limb, unspecified: Secondary | ICD-10-CM | POA: Diagnosis not present

## 2020-06-09 DIAGNOSIS — I739 Peripheral vascular disease, unspecified: Secondary | ICD-10-CM

## 2020-06-12 ENCOUNTER — Ambulatory Visit (INDEPENDENT_AMBULATORY_CARE_PROVIDER_SITE_OTHER): Payer: Medicare PPO | Admitting: Internal Medicine

## 2020-06-16 NOTE — Progress Notes (Signed)
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  States that she has been doing well.  Still been putting moisturizer to the area.  Denies any opening denies any swelling or redness.  They have no concerns today. Denies any fevers, chills, nausea, vomiting.  No calf pain, chest pain, shortness of breath.    Objective: AAO x3, NAD DP/PT pulses palpable bilaterally, CRT less than 3 seconds At this time there is a scar overlying the area with very minimal hyperkeratotic tissue. There is no underlying ulceration, drainage, signs of infection. No macerated tissue. No other areas of skin breakdown.  There is an abrasion on the left side No other open lesions identified today. 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 -Debrided hyperkeratotic tissue without any complications or bleeding.  Recommend a small amount of moisturizer to the area daily.  Continue inserts.  Continue [place] brace on the left side. -Discussed daily foot inspection. -Monitoring areas of skin breakdown or any other new issues.o  Return in about 3 months (around 09/09/2020).  Vivi Barrack DPM

## 2020-06-17 ENCOUNTER — Other Ambulatory Visit (INDEPENDENT_AMBULATORY_CARE_PROVIDER_SITE_OTHER): Payer: Self-pay | Admitting: Nurse Practitioner

## 2020-06-18 ENCOUNTER — Other Ambulatory Visit (INDEPENDENT_AMBULATORY_CARE_PROVIDER_SITE_OTHER): Payer: Self-pay | Admitting: Internal Medicine

## 2020-06-20 LAB — HM DIABETES EYE EXAM

## 2020-06-25 ENCOUNTER — Ambulatory Visit (HOSPITAL_COMMUNITY)
Admission: RE | Admit: 2020-06-25 | Discharge: 2020-06-25 | Disposition: A | Discharge status: Home / Self Care | Payer: Medicare PPO | Source: Ambulatory Visit | Attending: Obstetrics & Gynecology | Admitting: Obstetrics & Gynecology

## 2020-06-25 ENCOUNTER — Other Ambulatory Visit: Payer: Self-pay

## 2020-06-25 DIAGNOSIS — Z1231 Encounter for screening mammogram for malignant neoplasm of breast: Secondary | ICD-10-CM | POA: Insufficient documentation

## 2020-07-03 ENCOUNTER — Ambulatory Visit (INDEPENDENT_AMBULATORY_CARE_PROVIDER_SITE_OTHER): Payer: Medicare PPO | Admitting: Nurse Practitioner

## 2020-07-03 ENCOUNTER — Other Ambulatory Visit: Payer: Self-pay

## 2020-07-03 ENCOUNTER — Encounter (INDEPENDENT_AMBULATORY_CARE_PROVIDER_SITE_OTHER): Payer: Self-pay | Admitting: Nurse Practitioner

## 2020-07-03 VITALS — BP 120/70 | HR 57 | Temp 97.2°F | Ht 65.5 in | Wt 240.0 lb

## 2020-07-03 DIAGNOSIS — Z23 Encounter for immunization: Secondary | ICD-10-CM | POA: Diagnosis not present

## 2020-07-03 DIAGNOSIS — Z0000 Encounter for general adult medical examination without abnormal findings: Secondary | ICD-10-CM

## 2020-07-03 NOTE — Patient Instructions (Addendum)
Senna-S - may take up to 2 tablets by mouth twice a day as needed for constipation.   Call Dr. Kendell Bane to schedule screening colonoscopy as soon as you are able to.  Melanie Spencer , Thank you for taking time to come for your Medicare Wellness Visit. I appreciate your ongoing commitment to your health goals. Please review the following plan we discussed and let me know if I can assist you in the future.   These are the goals we discussed: Goals    . Set My Chlid's/My Target A1C-Diabetes Tyype 2     A1c GOAL OF < 8       This is a list of the screening recommended for you and due dates:  Health Maintenance  Topic Date Due  . Complete foot exam   Never done  . Eye exam for diabetics  Never done  . COVID-19 Vaccine (1) Never done  . Colon Cancer Screening  Never done  . Flu Shot  02/24/2020  . Hemoglobin A1C  09/06/2020  . Pap Smear  07/11/2021  . Mammogram  06/25/2022  . Tetanus Vaccine  07/26/2024  . Pneumococcal vaccine  Completed  .  Hepatitis C: One time screening is recommended by Center for Disease Control  (CDC) for  adults born from [birth year] through 1966.   Completed  . HIV Screening  Completed    Influenza (Flu) Vaccine (Inactivated or Recombinant): What You Need to Know 1. Why get vaccinated? Influenza vaccine can prevent influenza (flu). Flu is a contagious disease that spreads around the [place] every year, usually between October and May. Anyone can get the flu, but it is more dangerous for some people. Infants and young children, people 55 years of age and older, pregnant women, and people with certain health conditions or a weakened immune system are at greatest risk of flu complications. Pneumonia, bronchitis, sinus infections and ear infections are examples of flu-related complications. If you have a medical condition, such as heart disease, cancer or diabetes, flu can make it worse. Flu can cause fever and chills, sore throat, muscle aches, fatigue, cough, headache,  and runny or stuffy nose. Some people may have vomiting and diarrhea, though this is more common in children than adults. Each year thousands of people in the [place] die from flu, and many more are hospitalized. Flu vaccine prevents millions of illnesses and flu-related visits to the doctor each year. 2. Influenza vaccine CDC recommends everyone 5 months of age and older get vaccinated every flu season. Children 6 months through 41 years of age may need 2 doses during a single flu season. Everyone else needs only 1 dose each flu season. It takes about 2 weeks for protection to develop after vaccination. There are many flu viruses, and they are always changing. Each year a new flu vaccine is made to protect against three or four viruses that are likely to cause disease in the upcoming flu season. Even when the vaccine doesn't exactly match these viruses, it may still provide some protection. Influenza vaccine does not cause flu. Influenza vaccine may be given at the same time as other vaccines. 3. Talk with your health care provider Tell your vaccine provider if the person getting the vaccine:  Has had an allergic reaction after a previous dose of influenza vaccine, or has any severe, life-threatening allergies.  Has ever had Guillain-Barr Syndrome (also called GBS). In some cases, your health care provider may decide to postpone influenza vaccination to a future  visit. People with minor illnesses, such as a cold, may be vaccinated. People who are moderately or severely ill should usually wait until they recover before getting influenza vaccine. Your health care provider can give you more information. 4. Risks of a vaccine reaction  Soreness, redness, and swelling where shot is given, fever, muscle aches, and headache can happen after influenza vaccine.  There may be a very small increased risk of Guillain-Barr Syndrome (GBS) after inactivated influenza vaccine (the flu shot). Young  children who get the flu shot along with pneumococcal vaccine (PCV13), and/or DTaP vaccine at the same time might be slightly more likely to have a seizure caused by fever. Tell your health care provider if a child who is getting flu vaccine has ever had a seizure. People sometimes faint after medical procedures, including vaccination. Tell your provider if you feel dizzy or have vision changes or ringing in the ears. As with any medicine, there is a very remote chance of a vaccine causing a severe allergic reaction, other serious injury, or death. 5. What if there is a serious problem? An allergic reaction could occur after the vaccinated person leaves the clinic. If you see signs of a severe allergic reaction (hives, swelling of the face and throat, difficulty breathing, a fast heartbeat, dizziness, or weakness), call 9-1-1 and get the person to the nearest hospital. For other signs that concern you, call your health care provider. Adverse reactions should be reported to the Vaccine Adverse Event Reporting System (VAERS). Your health care provider will usually file this report, or you can do it yourself. Visit the VAERS website at www.vaers.LAgents.no or call 260-185-4485.VAERS is only for reporting reactions, and VAERS staff do not give medical advice. 6. The National Vaccine Injury Compensation Program The [employer] Vaccine Injury Compensation Program (VICP) is a federal program that was created to compensate people who may have been injured by certain vaccines. Visit the VICP website at SpiritualWord.at or call 519-452-0769 to learn about the program and about filing a claim. There is a time limit to file a claim for compensation. 7. How can I learn more?  Ask your healthcare provider.  Call your local or state health department.  Contact the Centers for Disease Control and Prevention (CDC): ? Call 404-120-9393 (1-800-CDC-INFO) or ? Visit CDC's BiotechRoom.com.cy Vaccine  Information Statement (Interim) Inactivated Influenza Vaccine (03/09/2018) This information is not intended to replace advice given to you by your health care provider. Make sure you discuss any questions you have with your health care provider. Document Revised: 10/31/2018 Document Reviewed: 03/13/2018 Elsevier Patient Education  2020 [employer].

## 2020-07-03 NOTE — Progress Notes (Signed)
Subjective:   Melanie Spencer is a 59 y.o. female who presents for Medicare Annual (Subsequent) preventive examination.  Review of Systems     Cardiac Risk Factors include: diabetes mellitus;dyslipidemia;obesity (BMI >30kg/m2);sedentary lifestyle;hypertension     Objective:    Today's Vitals   07/03/20 1609 07/03/20 1614  BP: 120/70   Pulse: (!) 57   Temp: (!) 97.2 F (36.2 C)   TempSrc: Temporal   SpO2: 98%   Weight: 240 lb (108.9 kg)   Height: 5' 5.5" (1.664 m)   PainSc:  9    Body mass index is 39.33 kg/m.  Advanced Directives 07/03/2020 11/08/2019 06/19/2019  Does Patient Have a Medical Advance Directive? No No No  Would patient like information on creating a medical advance directive? No - Patient declined No - Patient declined Yes (Inpatient - patient defers creating a medical advance directive at this time - Information given)    Current Medications (verified) Outpatient Encounter Medications as of 07/03/2020  Medication Sig  . amLODipine (NORVASC) 2.5 MG tablet   . aspirin 325 MG tablet Take 325 mg by mouth daily.  . Blood Glucose Monitoring Suppl (ACCU-CHEK GUIDE ME) w/Device KIT   . Blood Pressure Monitoring (BLOOD PRESSURE MONITOR AUTOMAT) DEVI Check blood pressure at home daily  . cholecalciferol (VITAMIN D) 1000 UNITS tablet Take 1,000 Units by mouth daily.   . collagenase (SANTYL) ointment Apply 1 application topically daily.  Marland Kitchen glucose blood (ACCU-CHEK GUIDE) test strip USE FOUR TIMES DAILY  . LANTUS SOLOSTAR 100 UNIT/ML Solostar Pen Inject 40 Units into the skin at bedtime.  . metFORMIN (GLUCOPHAGE) 1000 MG tablet TAKE 1 TABLET BY MOUTH TWICE DAILY  . omeprazole (PRILOSEC) 20 MG capsule TAKE 1 CAPSULE BY MOUTH DAILY  . oxybutynin (DITROPAN) 5 MG tablet TAKE 1 TABLET(5 MG) BY MOUTH THREE TIMES DAILY  . ramipril (ALTACE) 10 MG capsule TAKE 2 CAPSULES(20 MG) BY MOUTH DAILY  . simvastatin (ZOCOR) 40 MG tablet TAKE 1 TABLET(40 MG) BY MOUTH AT BEDTIME  .  TRULICITY 1.5 EB/5.8XE SOPN ADMINISTER 1.5 ML UNDER THE SKIN 1 TIME A WEEK  . ciprofloxacin (CIPRO) 500 MG tablet Take 1 tablet (500 mg total) by mouth 2 (two) times daily. (Patient not taking: Reported on 07/03/2020)  . fluticasone (FLONASE) 50 MCG/ACT nasal spray SHAKE LIQUID AND USE 2 SPRAYS IN EACH NOSTRIL EVERY DAY  . gabapentin (NEURONTIN) 300 MG capsule TAKE 2 CAPSULES BY MOUTH THREE TIMES DAILY  . nitrofurantoin, macrocrystal-monohydrate, (MACROBID) 100 MG capsule Take 1 capsule (100 mg total) by mouth 2 (two) times daily. (Patient not taking: Reported on 07/03/2020)  . TRULICITY 9.40 HW/8.0SU SOPN ADMINISTER 0.75 MG UNDER THE SKIN 1 TIME A WEEK (Patient not taking: Reported on 07/03/2020)  . [DISCONTINUED] fluticasone (FLONASE) 50 MCG/ACT nasal spray Place 1 spray into both nostrils daily.  . [DISCONTINUED] gabapentin (NEURONTIN) 300 MG capsule Take 300 mg by mouth 2 (two) times daily.  . [DISCONTINUED] LANTUS SOLOSTAR 100 UNIT/ML Solostar Pen Inject 75 Units into the skin at bedtime. (Patient taking differently: Inject 62 Units into the skin at bedtime. )  . [DISCONTINUED] LANTUS SOLOSTAR 100 UNIT/ML Solostar Pen ADMINISTER 75 UNITS UNDER THE SKIN AT BEDTIME  . [DISCONTINUED] metFORMIN (GLUCOPHAGE) 1000 MG tablet 1,000 mg 2 (two) times daily with a meal.   . [DISCONTINUED] metFORMIN (GLUCOPHAGE) 1000 MG tablet TAKE 1 TABLET BY MOUTH TWICE DAILY  . [DISCONTINUED] ramipril (ALTACE) 10 MG capsule TAKE 2 CAPSULES(20 MG) BY MOUTH DAILY  . [DISCONTINUED] simvastatin (  ZOCOR) 40 MG tablet Take 1 tablet (40 mg total) by mouth daily at 6 PM.  . [DISCONTINUED] simvastatin (ZOCOR) 40 MG tablet TAKE 1 TABLET BY MOUTH AT BEDTIME   No facility-administered encounter medications on file as of 07/03/2020.    Allergies (verified) Patient has no known allergies.   History: Past Medical History:  Diagnosis Date  . Cerebrovascular disease 05/23/2019  . Essential hypertension, benign 05/23/2019  . History  of stroke 2007   left leg weakness  . HLD (hyperlipidemia) 05/23/2019  . Morbid obesity ([place]) 05/23/2019  . Type II diabetes mellitus, uncontrolled ([place]) 05/23/2019  . Vitamin D deficiency    Past Surgical History:  Procedure Laterality Date  . AMPUTATION Right 11/09/2019   Procedure: 5TH AMPUTATION RAY;  Surgeon: Trula Slade, DPM;  Location: [facility];  Service: Podiatry;  Laterality: Right;  . CHOLECYSTECTOMY     Family History  Problem Relation Age of Onset  . Heart disease Mother   . Diabetes Mother   . Hypertension Mother   . Cancer Father        Lung  . Hypertension Sister   . Macular degeneration Sister   . Appendicitis Brother   . Hypertension Sister   . Diabetes Sister    Social History   Socioeconomic History  . Marital status: Married    Spouse name: Not on file  . Number of children: Not on file  . Years of education: 2  . Highest education level: Some college, no degree  Occupational History  . 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 Smoking status: Former Smoker    Packs/day: 2.00    Years: 20.00    Pack years: 40.00    Types: Cigarettes    Quit date: 07/26/2005    Years since quitting: 14.9  . Smokeless tobacco: Never Used  Vaping Use  . Vaping Use: Never used  Substance and Sexual Activity  . Alcohol use: No    Alcohol/week: 0.0 standard drinks  . Drug use: No  . Sexual activity: Not Currently    Birth control/protection: Post-menopausal  Other Topics Concern  . Not on file  Social History Narrative   Married for 31 yrs.On disability secondary to CVA.Previously licensed Medical illustrator.   Social Determinants of Health   Financial Resource Strain: Not on file  Food Insecurity: Not on file  Transportation Needs: Not on file  Physical Activity: Not on file  Stress: Not on file  Social Connections: Not on file    Tobacco Counseling Counseling given: Yes   Clinical Intake:  Pre-visit  preparation completed: Yes  Pain : 0-10 Pain Score: 9  Pain Type: Chronic pain Pain Location: Knee Pain Orientation: Other (Comment) (bilateral) Pain Descriptors / Indicators: Aching Pain Onset: More than a month ago Pain Frequency: Intermittent     BMI - recorded: 39.33 Nutritional Status: BMI > 30  Obese Nutritional Risks: None Diabetes: Yes CBG done?: No Did pt. bring in CBG monitor from home?:  (no. This AM was 142.)  How often do you need to have someone help you when you read instructions, pamphlets, or other written materials from your doctor or pharmacy?: 2 - Rarely What is the last grade level you completed in school?: Some college  Diabetic? Yes  Interpreter Needed?: No  Information entered by :: Jeralyn Ruths, NP-C   Activities of Daily Living In your present state of health, do you have any difficulty performing  the following activities: 07/03/2020  Hearing? N  Vision? Y  Comment Waiting on new glasses prescription to come in  Difficulty concentrating or making decisions? N  Walking or climbing stairs? Y  Dressing or bathing? Y  Doing errands, shopping? Y  Preparing Food and eating ? N  Using the Toilet? N  In the past six months, have you accidently leaked urine? Y  Do you have problems with loss of bowel control? N  Managing your Medications? N  Managing your Finances? Y  Housekeeping or managing your Housekeeping? Y  Some recent data might be hidden    Patient Care Team: Doree Albee, MD as PCP - General (Internal Medicine)  Indicate any recent Medical Services you may have received from other than [facility] providers in the past year (date may be approximate).     Assessment:   This is a routine wellness examination for Melanie Spencer.  Hearing/Vision screen No exam data present  Dietary issues and exercise activities discussed: Current Exercise Habits: The patient does not participate in regular exercise at present, Exercise limited by: orthopedic  condition(s);neurologic condition(s)  Goals    . Set My Chlid's/My Target A1C-Diabetes Tyype 2     A1c GOAL OF < 8      Depression Screen PHQ 2/9 Scores 07/03/2020 08/02/2019 06/19/2019 07/11/2018  PHQ - 2 Score 0 0 0 0  PHQ- 9 Score 0 - - -    Fall Risk Fall Risk  07/03/2020 08/02/2019 06/19/2019 07/11/2018  Falls in the past year? 0 0 0 0  Number falls in past yr: 0 0 - -  Injury with Fall? 0 0 - -  Risk for fall due to : - - History of fall(s);Impaired balance/gait -  Follow up Education provided;Falls prevention discussed - - -    FALL RISK PREVENTION PERTAINING TO THE HOME:  Any stairs in or around the home? Yes  If so, are there any without handrails? No  Home free of loose throw rugs in walkways, pet beds, electrical cords, etc? Yes  Adequate lighting in your home to reduce risk of falls? Yes   ASSISTIVE DEVICES UTILIZED TO PREVENT FALLS:  Life alert? No  Use of a cane, walker or w/c? Yes  Grab bars in the bathroom? Yes  Shower chair or bench in shower? Yes  Elevated toilet seat or a handicapped toilet? No   TIMED UP AND GO:  Was the test performed? Yes .  Length of time to ambulate 10 feet: 28 sec.   Gait unsteady with use of assistive device, provider informed and education provided.   Cognitive Function:     6CIT Screen 07/03/2020 06/19/2019  What Year? 0 points 0 points  What month? 0 points 0 points  What time? 0 points 0 points  Count back from 20 0 points 0 points  Months in reverse 0 points 2 points  Repeat phrase 4 points 0 points  Total Score 4 2    Immunizations Immunization History  Administered Date(s) Administered  . Influenza,inj,Quad PF,6+ Mos 05/23/2019  . Pneumococcal Polysaccharide-23 05/30/2012, 06/27/2018, 08/26/2018  . Td 07/26/2014    TDAP status: Up to date  Flu Vaccine status: Completed at today's visit  Pneumococcal vaccine status: Up to date  Covid-19 vaccine status: Declined, Education has been provided regarding the  importance of this vaccine but patient still declined. Advised may receive this vaccine at local pharmacy or Health Dept.or vaccine clinic. Aware to provide a copy of the vaccination record if obtained  from local pharmacy or Health Dept. Verbalized acceptance and understanding.  Qualifies for Shingles Vaccine? Yes   Zostavax completed No   Shingrix Completed?: No.    Education has been provided regarding the importance of this vaccine. Patient has been advised to call insurance company to determine out of pocket expense if they have not yet received this vaccine. Advised may also receive vaccine at local pharmacy or Health Dept. Verbalized acceptance and understanding.  Screening Tests Health Maintenance  Topic Date Due  . FOOT EXAM  Never done  . OPHTHALMOLOGY EXAM  Never done  . COVID-19 Vaccine (1) Never done  . COLONOSCOPY  Never done  . INFLUENZA VACCINE  02/24/2020  . HEMOGLOBIN A1C  09/06/2020  . PAP SMEAR-Modifier  07/11/2021  . MAMMOGRAM  06/25/2022  . TETANUS/TDAP  07/26/2024  . PNEUMOCOCCAL POLYSACCHARIDE VACCINE AGE 69-64 HIGH RISK  Completed  . Hepatitis C Screening  Completed  . HIV Screening  Completed    Health Maintenance  Health Maintenance Due  Topic Date Due  . FOOT EXAM  Never done  . OPHTHALMOLOGY EXAM  Never done  . COVID-19 Vaccine (1) Never done  . COLONOSCOPY  Never done  . INFLUENZA VACCINE  02/24/2020    Colorectal cancer screening: Type of screening: Colonoscopy. Completed 2010. Repeat every 10 years - Patient plans on following up with her GI doc at the start of the upcoming New Year  Mammogram status: Completed 2021. Repeat every year   Lung Cancer Screening: (Low Dose CT Chest recommended if Age 63-80 years, 30 pack-year currently smoking OR have quit w/in 15years.) does qualify.   Lung Cancer Screening Referral: n/a patient does not want to undergo lung cancer screening at this time  Additional Screening:  Hepatitis C Screening: does not  qualify; Completed 2020  Vision Screening: Recommended annual ophthalmology exams for early detection of glaucoma and other disorders of the eye. Is the patient up to date with their annual eye exam?  Yes  Who is the provider or what is the name of the office in which the patient attends annual eye exams? [facility] (Dr. Jorja Loa) If pt is not established with a provider, would they like to be referred to a provider to establish care? No .   Dental Screening: Recommended annual dental exams for proper oral hygiene  Community Resource Referral / Chronic Care Management: CRR required this visit?  No   CCM required this visit?  No      Plan:     I have personally reviewed and noted the following in the patient's chart:   . Medical and social history . Use of alcohol, tobacco or illicit drugs  . Current medications and supplements . Functional ability and status . Nutritional status . Physical activity . Advanced directives . List of other physicians . Hospitalizations, surgeries, and ER visits in previous 12 months . Vitals . Screenings to include cognitive, depression, and falls . Referrals and appointments  In addition, I have reviewed and discussed with patient certain preventive protocols, quality metrics, and best practice recommendations. A written personalized care plan for preventive services as well as general preventive health recommendations were provided to patient.     Ailene Ards, NP   07/03/2020   She will follow-up as scheduled next month, or sooner as needed.

## 2020-07-03 NOTE — Addendum Note (Signed)
Addended by: Elenore Paddy on: 07/03/2020 04:49 PM   Modules accepted: Orders

## 2020-07-09 ENCOUNTER — Other Ambulatory Visit (INDEPENDENT_AMBULATORY_CARE_PROVIDER_SITE_OTHER): Payer: Self-pay | Admitting: Internal Medicine

## 2020-07-13 ENCOUNTER — Other Ambulatory Visit (INDEPENDENT_AMBULATORY_CARE_PROVIDER_SITE_OTHER): Payer: Self-pay | Admitting: Internal Medicine

## 2020-07-31 ENCOUNTER — Other Ambulatory Visit: Payer: Self-pay

## 2020-07-31 ENCOUNTER — Ambulatory Visit (INDEPENDENT_AMBULATORY_CARE_PROVIDER_SITE_OTHER): Payer: Medicare PPO | Admitting: Internal Medicine

## 2020-07-31 ENCOUNTER — Encounter (INDEPENDENT_AMBULATORY_CARE_PROVIDER_SITE_OTHER): Payer: Self-pay | Admitting: Internal Medicine

## 2020-07-31 VITALS — BP 158/82 | HR 65 | Temp 96.8°F | Ht 65.5 in | Wt 247.2 lb

## 2020-07-31 DIAGNOSIS — E1165 Type 2 diabetes mellitus with hyperglycemia: Secondary | ICD-10-CM | POA: Diagnosis not present

## 2020-07-31 DIAGNOSIS — I10 Essential (primary) hypertension: Secondary | ICD-10-CM | POA: Diagnosis not present

## 2020-07-31 NOTE — Progress Notes (Signed)
Metrics: Intervention Frequency ACO  Documented Smoking Status Yearly  Screened one or more times in 24 months  Cessation Counseling or  Active cessation medication Past 24 months  Past 24 months   Guideline developer: UpToDate (See UpToDate for funding source) Date Released: 2014       Wellness Office Visit  Subjective:  Patient ID: Melanie Spencer, female    DOB: 09/20/1960  Age: 60 y.o. MRN: [id number]  CC: This lady comes in for follow-up of uncontrolled diabetes, hypertension and morbid obesity. HPI  Over the holidays, she has gained weight and has not been eating as consistently as she was previously when she lost weight and A1c improved. She has been taking Trulicity, insulin and metformin for her diabetes.  She continues on ramipril for hypertension. She continues on statin therapy. She has no specific complaints today. Past Medical History:  Diagnosis Date  . Cerebrovascular disease 05/23/2019  . Essential hypertension, benign 05/23/2019  . History of stroke 2007   left leg weakness  . HLD (hyperlipidemia) 05/23/2019  . Morbid obesity ([place]) 05/23/2019  . Type II diabetes mellitus, uncontrolled ([place]) 05/23/2019  . Vitamin D deficiency    Past Surgical History:  Procedure Laterality Date  . AMPUTATION Right 11/09/2019   Procedure: 5TH AMPUTATION RAY;  Surgeon: Trula Slade, DPM;  Location: [facility];  Service: Podiatry;  Laterality: Right;  . CHOLECYSTECTOMY       Family History  Problem Relation Age of Onset  . Heart disease Mother   . Diabetes Mother   . Hypertension Mother   . Cancer Father        Lung  . Hypertension Sister   . Macular degeneration Sister   . Appendicitis Brother   . Hypertension Sister   . Diabetes Sister     Social History   Social History Narrative   Married for 31 yrs.On disability secondary to CVA.Previously licensed Medical illustrator.   Social History   Tobacco Use  . Smoking status: Former Smoker    Packs/day: 2.00     Years: 20.00    Pack years: 40.00    Types: Cigarettes    Quit date: 07/26/2005    Years since quitting: 15.0  . Smokeless tobacco: Never Used  Substance Use Topics  . Alcohol use: No    Alcohol/week: 0.0 standard drinks    Current Meds  Medication Sig  . amLODipine (NORVASC) 2.5 MG tablet   . aspirin 325 MG tablet Take 325 mg by mouth daily.  . Blood Glucose Monitoring Suppl (ACCU-CHEK GUIDE ME) w/Device KIT   . Blood Pressure Monitoring (BLOOD PRESSURE MONITOR AUTOMAT) DEVI Check blood pressure at home daily  . cholecalciferol (VITAMIN D) 1000 UNITS tablet Take 1,000 Units by mouth daily.   . collagenase (SANTYL) ointment Apply 1 application topically daily.  . fluticasone (FLONASE) 50 MCG/ACT nasal spray SHAKE LIQUID AND USE 2 SPRAYS IN EACH NOSTRIL EVERY DAY  . gabapentin (NEURONTIN) 300 MG capsule TAKE 2 CAPSULES BY MOUTH THREE TIMES DAILY  . glucose blood (ACCU-CHEK GUIDE) test strip USE FOUR TIMES DAILY  . LANTUS SOLOSTAR 100 UNIT/ML Solostar Pen Inject 40 Units into the skin at bedtime.  . metFORMIN (GLUCOPHAGE) 1000 MG tablet TAKE 1 TABLET BY MOUTH TWICE DAILY  . omeprazole (PRILOSEC) 20 MG capsule TAKE 1 CAPSULE BY MOUTH DAILY  . oxybutynin (DITROPAN) 5 MG tablet TAKE 1 TABLET(5 MG) BY MOUTH THREE TIMES DAILY  . ramipril (ALTACE) 10 MG capsule TAKE 2 CAPSULES(20 MG) BY MOUTH  DAILY  . senna (SENOKOT) 8.6 MG TABS tablet Take 2 tablets by mouth 2 (two) times daily as needed.  . simvastatin (ZOCOR) 40 MG tablet TAKE 1 TABLET(40 MG) BY MOUTH AT BEDTIME  . TRULICITY 1.5 FM/7.3UY SOPN ADMINISTER 1.5 ML UNDER THE SKIN 1 TIME A WEEK      Depression screen [facility] 2/9 07/03/2020 08/02/2019 06/19/2019 07/11/2018  Decreased Interest 0 0 0 0  Down, Depressed, Hopeless 0 0 0 0  PHQ - 2 Score 0 0 0 0  Altered sleeping 0 - - -  Tired, decreased energy 0 - - -  Change in appetite 0 - - -  Feeling bad or failure about yourself  0 - - -  Trouble concentrating 0 - - -  Moving slowly or  fidgety/restless 0 - - -  Suicidal thoughts 0 - - -  PHQ-9 Score 0 - - -  Difficult doing work/chores Not difficult at all - - -     Objective:   Today's Vitals: BP (!) 158/82   Pulse 65   Temp (!) 96.8 F (36 C) (Temporal)   Ht 5' 5.5" (1.664 m)   Wt 247 lb 3.2 oz (112.1 kg)   LMP 03/02/2012   SpO2 99%   BMI 40.51 kg/m  Vitals with BMI 07/31/2020 07/03/2020 03/20/2020  Height 5' 5.5" 5' 5.5" (No Data)  Weight 247 lbs 3 oz 240 lbs (No Data)  BMI 37.0 96.43 -  Systolic 838 184 (No Data)  Diastolic 82 70 (No Data)  Pulse 65 57 -     Physical Exam   She remains morbidly obese.  She has gained 7 pounds.  Blood pressure somewhat elevated today.  Alert and orientated without any focal neurological signs.    Assessment   1. Essential hypertension, benign   2. Uncontrolled type 2 diabetes mellitus with hyperglycemia ([place])   3. Morbid obesity ([place])       Tests ordered Orders Placed This Encounter  Procedures  . COMPLETE METABOLIC PANEL WITH GFR  . Hemoglobin A1c     Plan: 1. She will continue with ramipril for hypertension.  We will check electrolytes. 2. She will continue with insulin, Trulicity and metformin for diabetes and we will check an A1c today. 3. Follow-up with Judson Roch in about 3 months.   No orders of the defined types were placed in this encounter.   Doree Albee, MD

## 2020-08-01 LAB — COMPLETE METABOLIC PANEL WITH GFR
AG Ratio: 1.2 (calc) (ref 1.0–2.5)
ALT: 16 U/L (ref 6–29)
AST: 17 U/L (ref 10–35)
Albumin: 3.8 g/dL (ref 3.6–5.1)
Alkaline phosphatase (APISO): 100 U/L (ref 37–153)
BUN: 16 mg/dL (ref 7–25)
CO2: 30 mmol/L (ref 20–32)
Calcium: 9.2 mg/dL (ref 8.6–10.4)
Chloride: 102 mmol/L (ref 98–110)
Creat: 0.87 mg/dL (ref 0.50–0.99)
GFR, Est African American: 84 mL/min/{1.73_m2} (ref >=60)
GFR, Est Non African American: 72 mL/min/{1.73_m2} (ref >=60)
Globulin: 3.2 g/dL (calc) (ref 1.9–3.7)
Glucose, Bld: 183 mg/dL — ABNORMAL HIGH (ref 65–139)
Potassium: 4.3 mmol/L (ref 3.5–5.3)
Sodium: 139 mmol/L (ref 135–146)
Total Bilirubin: 0.3 mg/dL (ref 0.2–1.2)
Total Protein: 7 g/dL (ref 6.1–8.1)

## 2020-08-01 LAB — HEMOGLOBIN A1C
Hgb A1c MFr Bld: 8.9 % of total Hgb — ABNORMAL HIGH (ref <5.7)
Mean Plasma Glucose: 209 mg/dL
eAG (mmol/L): 11.6 mmol/L

## 2020-08-01 NOTE — Progress Notes (Signed)
Please let the patient know that diabetes is getting worse.  She needs to continue to focus on her diet.  Follow-up as scheduled with Maralyn Sago.

## 2020-08-04 ENCOUNTER — Encounter (INDEPENDENT_AMBULATORY_CARE_PROVIDER_SITE_OTHER): Payer: Self-pay | Admitting: Internal Medicine

## 2020-08-04 NOTE — Progress Notes (Signed)
This pt was called. No way to leave a message ; voicemail was full. Would you like to send a letter  via mail at this time?

## 2020-08-05 NOTE — Progress Notes (Signed)
Pt called and notified of lab results. Pt stated she figured it would. She really enjoyed christmas holidays. Lots of egg nog, cakes, etc. Now she will try not to eat sweets when they go out to eat.

## 2020-08-12 ENCOUNTER — Other Ambulatory Visit (INDEPENDENT_AMBULATORY_CARE_PROVIDER_SITE_OTHER): Payer: Self-pay | Admitting: Internal Medicine

## 2020-08-12 DIAGNOSIS — N3941 Urge incontinence: Secondary | ICD-10-CM

## 2020-08-14 ENCOUNTER — Other Ambulatory Visit (INDEPENDENT_AMBULATORY_CARE_PROVIDER_SITE_OTHER): Payer: Self-pay | Admitting: Internal Medicine

## 2020-09-11 ENCOUNTER — Other Ambulatory Visit (INDEPENDENT_AMBULATORY_CARE_PROVIDER_SITE_OTHER): Payer: Self-pay | Admitting: Internal Medicine

## 2020-09-11 ENCOUNTER — Other Ambulatory Visit: Payer: Self-pay

## 2020-09-11 ENCOUNTER — Ambulatory Visit: Payer: Medicare PPO | Admitting: Podiatry

## 2020-09-11 DIAGNOSIS — B351 Tinea unguium: Secondary | ICD-10-CM

## 2020-09-11 DIAGNOSIS — E1149 Type 2 diabetes mellitus with other diabetic neurological complication: Secondary | ICD-10-CM

## 2020-09-11 DIAGNOSIS — M79674 Pain in right toe(s): Secondary | ICD-10-CM | POA: Diagnosis not present

## 2020-09-11 DIAGNOSIS — M79675 Pain in left toe(s): Secondary | ICD-10-CM | POA: Diagnosis not present

## 2020-09-11 DIAGNOSIS — Z899 Acquired absence of limb, unspecified: Secondary | ICD-10-CM | POA: Diagnosis not present

## 2020-09-16 NOTE — Progress Notes (Signed)
Subjective: 60 y.o. returns the office today for diabetic foot evaluation as well as for painful, elongated, thickened toenails which she cannot trim herself.  The surgery in the right foot appears to be doing well and is not opened or cause any issues.  She been wearing the brace on the left foot which has been helpful as well.  Denies any new ulcerations or concerns today. Denies any systemic complaints such as fevers, chills, nausea, vomiting.   PCP: [name], MD A1c: 8.9 on 07/31/2020  Objective: AAO 3, NAD DP/PT pulses palpable, CRT less than 3 seconds Protective sensation decreased with Simms Weinstein monofilament, Achilles tendon reflex intact.  Nails hypertrophic, dystrophic, elongated, brittle, discolored 9. There is tenderness overlying the nails 1-5 bilaterally except for the right fifth toe which was amputated. There is no surrounding erythema or drainage along the nail sites. Along the area of the amputation site there is a small skin fissure.  There is no active drainage, bleeding.  Signs of infection.  There are some callus formation present along the incision site. Brace is putting on the left foot. No open lesions or pre-ulcerative lesions are identified. No pain with calf compression, swelling, warmth, erythema.  Assessment: Patient presents with symptomatic onychomycosis; skin fissure  Plan: -Treatment options including alternatives, risks, complications were discussed -Nails sharply debrided 9 without complication/bleeding. -Debrided the skin fissure on the right without any complications or bleeding.  Continue antibiotic ointment dressing changes daily.  The husband states that this has happened previously and is cleared up.  If not healed in the next 1 to 2 weeks let me know or sooner if any issues are to arise.  Monitor closely for any signs or symptoms of infection or worsening. -Glucose control and daily foot inspection.  -Discussed daily foot inspection.  If there are any changes, to call the office immediately.  -Follow-up in 3 months or sooner if any problems are to arise. In the meantime, encouraged to call the office with any questions, concerns, changes symptoms.  Ovid Curd, DPM

## 2020-09-18 ENCOUNTER — Other Ambulatory Visit (INDEPENDENT_AMBULATORY_CARE_PROVIDER_SITE_OTHER): Payer: Self-pay | Admitting: Internal Medicine

## 2020-09-24 ENCOUNTER — Telehealth (INDEPENDENT_AMBULATORY_CARE_PROVIDER_SITE_OTHER): Payer: Medicare PPO | Admitting: Nurse Practitioner

## 2020-09-24 ENCOUNTER — Other Ambulatory Visit: Payer: Self-pay

## 2020-09-24 ENCOUNTER — Encounter (INDEPENDENT_AMBULATORY_CARE_PROVIDER_SITE_OTHER): Payer: Self-pay | Admitting: Nurse Practitioner

## 2020-09-24 VITALS — Temp 97.5°F | Ht 65.5 in

## 2020-09-24 DIAGNOSIS — R300 Dysuria: Secondary | ICD-10-CM | POA: Diagnosis not present

## 2020-09-24 NOTE — Progress Notes (Signed)
An audio-only tele-health visit was conducted today. I connected with  Melanie Spencer on 09/24/20 utilizing audio-only technology and verified that I am speaking with the correct person using two identifiers. The patient was located at their home, and I was located at the office of [facility] during the encounter. I discussed the limitations of evaluation and management by telemedicine. The patient expressed understanding and agreed to proceed.   Subjective:  Patient ID: Melanie Spencer, female    DOB: 07-Jan-1961  Age: 60 y.o. MRN: [id number]  CC:  Chief Complaint  Patient presents with  . Urinary Frequency    Burning with urination, pelvic pressure, odor, started 2 days ago      HPI  This patient arrives today for virtual visit for the above.  Approximately 2 days ago she started to experience some dysuria, pelvic pressure, and had noticed an odor in her urine.  She is concerned she has urinary tract infection.  She denies any fevers, abdominal pain, back pain, or hematuria.  Past Medical History:  Diagnosis Date  . Cerebrovascular disease 05/23/2019  . Essential hypertension, benign 05/23/2019  . History of stroke 2007   left leg weakness  . HLD (hyperlipidemia) 05/23/2019  . Morbid obesity ([place]) 05/23/2019  . Type II diabetes mellitus, uncontrolled ([place]) 05/23/2019  . Vitamin D deficiency       Family History  Problem Relation Age of Onset  . Heart disease Mother   . Diabetes Mother   . Hypertension Mother   . Cancer Father        Lung  . Hypertension Sister   . Macular degeneration Sister   . Appendicitis Brother   . Hypertension Sister   . Diabetes Sister     Social History   Social History Narrative   Married for 31 yrs.On disability secondary to CVA.Previously licensed Medical illustrator.   Social History   Tobacco Use  . Smoking status: Former Smoker    Packs/day: 2.00    Years: 20.00    Pack years: 40.00    Types: Cigarettes    Quit date:  07/26/2005    Years since quitting: 15.1  . Smokeless tobacco: Never Used  Substance Use Topics  . Alcohol use: No    Alcohol/week: 0.0 standard drinks     Current Meds  Medication Sig  . amLODipine (NORVASC) 2.5 MG tablet   . aspirin 325 MG tablet Take 325 mg by mouth daily.  . Blood Glucose Monitoring Suppl (ACCU-CHEK GUIDE ME) w/Device KIT   . Blood Pressure Monitoring (BLOOD PRESSURE MONITOR AUTOMAT) DEVI Check blood pressure at home daily  . cholecalciferol (VITAMIN D) 1000 UNITS tablet Take 1,000 Units by mouth daily.   . collagenase (SANTYL) ointment Apply 1 application topically daily.  Marland Kitchen glucose blood (ACCU-CHEK GUIDE) test strip USE FOUR TIMES DAILY  . LANTUS SOLOSTAR 100 UNIT/ML Solostar Pen ADMINISTER 40 UNITS UNDER THE SKIN AT BEDTIME  . metFORMIN (GLUCOPHAGE) 1000 MG tablet TAKE 1 TABLET BY MOUTH TWICE DAILY  . omeprazole (PRILOSEC) 20 MG capsule TAKE 1 CAPSULE BY MOUTH DAILY  . oxybutynin (DITROPAN) 5 MG tablet TAKE 1 TABLET(5 MG) BY MOUTH THREE TIMES DAILY  . ramipril (ALTACE) 10 MG capsule TAKE 2 CAPSULES(20 MG) BY MOUTH DAILY  . senna (SENOKOT) 8.6 MG TABS tablet Take 2 tablets by mouth 2 (two) times daily as needed.  . simvastatin (ZOCOR) 40 MG tablet TAKE 1 TABLET(40 MG) BY MOUTH AT BEDTIME  . TRULICITY 1.5 RS/8.5IO  SOPN ADMINISTER 1.5 ML UNDER THE SKIN 1 TIME A WEEK    ROS:  Review of Systems  Constitutional: Negative for chills and fever.  Gastrointestinal: Negative for abdominal pain, nausea and vomiting.  Genitourinary: Positive for dysuria. Negative for hematuria.     Objective:   Today's Vitals: Temp (!) 97.5 F (36.4 C) (Temporal)   Ht 5' 5.5" (1.664 m)   LMP 03/02/2012   BMI 40.51 kg/m  Vitals with BMI 09/24/2020 07/31/2020 07/03/2020  Height 5' 5.5" 5' 5.5" 5' 5.5"  Weight (No Data) 247 lbs 3 oz 240 lbs  BMI - 94.9 44.73  Systolic (No Data) [id number] 441  Diastolic (No Data) 82 70  Pulse (No Data) 65 57     Physical Exam Comprehensive physical  exam not completed today as office visit was conducted remotely.  Patient sounded well over the phone.  Patient was alert and oriented, and appeared to have appropriate judgment.       Assessment and Plan   1. Dysuria      Plan: 1.  We will order urinalysis with reflex to culture and pending culture results will potentially prescribe antibiotic for treatment of UTI.  In the meantime I have encouraged her to consider using Azo over-the-counter as needed for dysuria symptoms.  She tells me she understands.   Tests ordered Orders Placed This Encounter  Procedures  . Urinalysis with Culture Reflex      No orders of the defined types were placed in this encounter.   Patient to follow-up as scheduled in April or sooner as needed.  Total time spent on telephone today was 5 minutes and 24 seconds.  Ailene Ards, NP

## 2020-09-26 DIAGNOSIS — R300 Dysuria: Secondary | ICD-10-CM | POA: Diagnosis not present

## 2020-09-29 ENCOUNTER — Other Ambulatory Visit (INDEPENDENT_AMBULATORY_CARE_PROVIDER_SITE_OTHER): Payer: Self-pay | Admitting: Nurse Practitioner

## 2020-09-29 DIAGNOSIS — N3000 Acute cystitis without hematuria: Secondary | ICD-10-CM

## 2020-09-29 LAB — URINALYSIS W MICROSCOPIC + REFLEX CULTURE
Bilirubin Urine: NEGATIVE
Hgb urine dipstick: NEGATIVE
Hyaline Cast: NONE SEEN /LPF
Ketones, ur: NEGATIVE
Nitrites, Initial: NEGATIVE
Specific Gravity, Urine: 1.014 (ref 1.001–1.03)
Squamous Epithelial / HPF: NONE SEEN /HPF (ref <=5)
WBC, UA: >=60 /HPF — AB (ref 0–5)
pH: <=5 (ref 5.0–8.0)

## 2020-09-29 LAB — URINE CULTURE

## 2020-09-29 LAB — CULTURE INDICATED

## 2020-09-29 MED ORDER — CIPROFLOXACIN HCL 250 MG PO TABS: 250.0000 mg | ORAL_TABLET | Freq: Two times a day (BID) | ORAL | 0 refills | Status: DC

## 2020-09-29 NOTE — Progress Notes (Signed)
Please call this patient and let her know that her urine culture results came back early this morning. Based on the results I am going to order ciprofloxacin 250mg  tablets. She should take 1 tablet by mouth every 12 hours for 3 days. If symptoms persist after 3 days she should call our office. Please warn her of serious side effects of this medication including C. Diff infection and tendon rupture. If she experiences frequent diarrhea/watery stools or if she starts to experience joint pain she should notify . If she has any questions let me know. I sent the prescription to [facility] on [address].

## 2020-09-29 NOTE — Progress Notes (Signed)
Called patient and she stated that she has just gotten her message and she is going to have her husband pick up her antibiotic. Patient did state that she is not having as much pressure. Patient verbalized an understanding and will call us back if she needs to.

## 2020-10-11 ENCOUNTER — Other Ambulatory Visit (INDEPENDENT_AMBULATORY_CARE_PROVIDER_SITE_OTHER): Payer: Self-pay | Admitting: Internal Medicine

## 2020-10-20 ENCOUNTER — Ambulatory Visit (INDEPENDENT_AMBULATORY_CARE_PROVIDER_SITE_OTHER): Payer: Medicare PPO | Admitting: *Deleted

## 2020-10-20 ENCOUNTER — Other Ambulatory Visit: Payer: Self-pay

## 2020-10-20 VITALS — Ht 66.5 in | Wt 248.0 lb

## 2020-10-20 DIAGNOSIS — Z1211 Encounter for screening for malignant neoplasm of colon: Secondary | ICD-10-CM

## 2020-10-20 MED ORDER — PEG 3350-KCL-NA BICARB-NACL 420 G PO SOLR: 4000.0000 mL | Freq: Once | ORAL | 0 refills | Status: AC

## 2020-10-20 NOTE — Progress Notes (Addendum)
Gastroenterology Pre-Procedure Review  Request Date: 10/20/2020 Requesting Physician: Past due recall (2020), Last TCS 09/28/2007 done by Dr. Jena Gauss, normal colon  PATIENT REVIEW QUESTIONS: The patient responded to the following health history questions as indicated:    1. Diabetes Melitis: yes, type II 2. Joint replacements in the past 12 months: no 3. Major health problems in the past 3 months: no 4. Has an artificial valve or MVP: no 5. Has a defibrillator: no 6. Has been advised in past to take antibiotics in advance of a procedure like teeth cleaning: no 7. Family history of colon cancer: no  8. Alcohol Use: no 9. Illicit drug Use: no 10. History of sleep apnea: no  11. History of coronary artery or other vascular stents placed within the last 12 months: no 12. History of any prior anesthesia complications: no 13. Body mass index is 39.43 kg/m.    MEDICATIONS & ALLERGIES:    Patient reports the following regarding taking any blood thinners:   Plavix? no Aspirin? yes, 325 mg Coumadin? no Brilinta? no Xarelto? no Eliquis? no Pradaxa? no Savaysa? no Effient? no  Patient confirms/reports the following medications:  Current Outpatient Medications  Medication Sig Dispense Refill  . aspirin 325 MG tablet Take 325 mg by mouth daily.    . cholecalciferol (VITAMIN D) 1000 UNITS tablet Take 1,000 Units by mouth daily.     . fluticasone (FLONASE) 50 MCG/ACT nasal spray SHAKE LIQUID AND USE 2 SPRAYS IN EACH NOSTRIL EVERY DAY 16 g 3  . gabapentin (NEURONTIN) 300 MG capsule TAKE 2 CAPSULES BY MOUTH THREE TIMES DAILY 180 capsule 3  . LANTUS SOLOSTAR 100 UNIT/ML Solostar Pen ADMINISTER 40 UNITS UNDER THE SKIN AT BEDTIME 15 mL 3  . metFORMIN (GLUCOPHAGE) 1000 MG tablet TAKE 1 TABLET BY MOUTH TWICE DAILY 60 tablet 2  . omeprazole (PRILOSEC) 20 MG capsule TAKE 1 CAPSULE BY MOUTH DAILY 30 capsule 2  . oxybutynin (DITROPAN) 5 MG tablet TAKE 1 TABLET(5 MG) BY MOUTH THREE TIMES DAILY 270 tablet  0  . ramipril (ALTACE) 10 MG capsule TAKE 2 CAPSULES(20 MG) BY MOUTH DAILY 180 capsule 0  . simvastatin (ZOCOR) 40 MG tablet TAKE 1 TABLET(40 MG) BY MOUTH AT BEDTIME 90 tablet 0  . TRULICITY 1.5 MG/0.5ML SOPN ADMINISTER 1.5 ML UNDER THE SKIN 1 TIME A WEEK 2 mL 3   No current facility-administered medications for this visit.    Patient confirms/reports the following allergies:  No Known Allergies  No orders of the defined types were placed in this encounter.   AUTHORIZATION INFORMATION Primary Insurance: [place],  [place] #:[id number] Pre-Cert / Berkley Harvey required: Yes, approved online [id number] Pre-Cert / Auth #: [id number]  SCHEDULE INFORMATION: Procedure has been scheduled as follows:  Date: 11/12/2020, Time: PM procedure Location: [facility] with Dr. Jena Gauss  This Gastroenterology Pre-Precedure Review Form is being routed to the following provider(s): Wynne Dust, NP

## 2020-10-20 NOTE — Patient Instructions (Addendum)
Covid test: 11/10/2020 at 3:35 PM.  Melanie Spencer   [birth year] MRN: [id number] Procedure Date: 11/12/2020 Arrival Time:   You will receive a call from the hospital a few days before your procedure.    Location of Procedure: [facility] Short Stay  PREPARATION FOR COLONOSCOPY WITH TRI-LYTE PREP   Please notify us immediately if you are diabetic, take iron supplements, or if you are on Coumadin or any other blood thinners.   Please hold the following medications:  See letter.  You will need to purchase 1 fleet enema and 1 box of Bisacodyl 5mg  tablets.   PROCEDURE IS SCHEDULED FOR Melanie Spencer AS FOLLOWS:  Procedure Date: 11/12/2020 Time to register: You will receive a call from the hospital a few days before your procedure. Place to register: 11/14/2020 Short Stay Scheduled provider: Dr. Jeani Hawking   2 DAYS BEFORE PROCEDURE:  DATE: 11/10/2020   DAY: Monday Begin clear liquid diet AFTER your lunch meal. NO SOLID FOODS!   1 DAY BEFORE PROCEDURE:  DATE: 11/11/2020   DAY: Tuesday  Continue clear liquids the entire day - NO SOLID FOOD.   Diabetic medications adjustments for today: See letter.  At 12:00pm (noon): Take 2 (two) Dulcolax (Bisacodyl) tablets  At 2:00pm: Start drinking your solution. Try to drink 1 (one) 8 ounce glass every 10-15 minutes, until you have consumed HALF the jug. (You should complete the first 1/2 of the jug in 2 hours. Wait 30 minutes, then drink 3-4 more glasses of the solution. Your stools should be clear; if not, you may have to consume the rest of the jug.   One hour after completing the solution: take the last 2 (two) Dulcolax (Bisacodyl) tablets, with a clear liquid.  YOU MUST DRINK PLENTY OF CLEAR LIQUIDS DURING YOUR PREP TO REDUCE RISKS OF KIDNEY FAILURE.   Continue clear liquids only until 4 hours before scheduled procedure. Do not eat or drink anything 4 hours before your procedure.   EXCEPTION:  If you take medications for your heart, blood pressure or  breathing, you may take these medications with a small amount of clear liquid.      DAY OF PROCEDURE:   DATE: 11/12/2020       DAY: Wednesday The morning of your procedure give yourself 1 (one) Fleet Enema, at least 1 hour before going to the hospital.   You may take Tylenol products. Please continue your regular medications unless we have instructed otherwise.   Diabetic medications adjustments for today. See letter.  Someone MUST be available to drive you home; the hospital will cancel this appointment if you do not have a driver.   Please call the office if you have any questions (Dept: [phone]).  Please see below for Dietary Information.  CLEAR LIQUIDS INCLUDE:  Water Jello (NOT red in color)   Ice Popsicles (NOT red in color)   Tea (sugar ok, no milk/cream) Powdered fruit flavored drinks  Coffee (sugar ok, no milk/cream) Gatorade/ Lemonade/ Kool-Aid  (NOT red in color)   Juice: apple, white grape, white cranberry Soft drinks  Clear bullion, consomme, broth (fat free beef/chicken/vegetable)  Carbonated beverages (any kind)  Strained chicken noodle soup Hard Candy   REMEMBER: Clear liquids are liquids that will allow you to see your fingers on the other side of a clear glass. Be sure liquids are NOT red in color, and not cloudy, but CLEAR.   DO NOT EAT OR DRINK ANY OF THE FOLLOWING:  Dairy products of any kind  Cranberry juice Tomato juice / V8 juice   Grapefruit juice Orange juice     Red grape juice  Do not eat any solid foods, including such foods as: cereal, oatmeal, yogurt, fruits, vegetables, creamed soups, eggs, bread, etc.    HELPFUL HINTS FOR DRINKING PREP SOLUTION:   Make sure prep is extremely cold. Refrigerate the night before. You may also put in the freezer.   You may try mixing some Crystal Light or Country Time Lemonade if you prefer. Mix in small amounts; add more if necessary.  Try drinking through a straw  Rinse mouth with water or a  mouthwash between glasses, to remove after-taste.  Try sipping on a cold beverage /ice/ popsicles between glasses of prep  Place a piece of sugar-free hard candy in mouth between glasses  If you become nauseated, try consuming smaller amounts, or stretch out the time between glasses. Stop for 30-60 minutes, then slowly start back drinking    You may call the office (Dept: [phone]) before 5:00pm, or page the doctor on call after 5:00pm ([phone]), for further instructions, if necessary.   OTHER INSTRUCTIONS  You will need a responsible adult at least 61 years of age to accompany you and drive you home. This person must remain in the waiting room during your procedure.  Wear loose fitting clothing that is easily removed.  Leave jewelry and other valuables at home.   Remove all body piercing jewelry and leave at home.  Total time from sign-in until discharge is approximately 2-3 hours.  You should go home directly after your procedure and rest. You can resume normal activities the day after your procedure.  The day of your procedure you should not:  Drive  Make legal decisions  Operate machinery  Drink alcohol  Return to work

## 2020-10-21 NOTE — Progress Notes (Signed)
Would she need an ov since she is an ASA III scheduled with Dr. Jena Gauss?

## 2020-10-21 NOTE — Progress Notes (Signed)
Nope, only if propofol ASA III do they need an OV.  : )

## 2020-10-21 NOTE — Progress Notes (Signed)
Ok to schedule (conscious sedation)  DM meds: Glucophage: None the morning of, Trulicity: No change, Lantus: 1/2 night before, none the morning of  On prep day: Check CBG ac and hs as well (if they normally check their blood sugar) as if the patient feels like their blood sugar is off. Can use soda, juice (that's in CLEAR LIQUIDS) as needed for any low blood sugar.  Check CBG on arrival to endo unit.  ASA III

## 2020-10-22 ENCOUNTER — Encounter: Payer: Self-pay | Admitting: *Deleted

## 2020-10-22 NOTE — Progress Notes (Signed)
Mailed letter to pt with diabetes medication adjustments.   

## 2020-10-30 ENCOUNTER — Encounter (INDEPENDENT_AMBULATORY_CARE_PROVIDER_SITE_OTHER): Payer: Self-pay | Admitting: Internal Medicine

## 2020-10-30 ENCOUNTER — Ambulatory Visit (INDEPENDENT_AMBULATORY_CARE_PROVIDER_SITE_OTHER): Payer: Medicare PPO | Admitting: Internal Medicine

## 2020-10-30 ENCOUNTER — Other Ambulatory Visit: Payer: Self-pay

## 2020-10-30 VITALS — BP 142/78 | HR 66 | Temp 96.9°F | Ht 65.5 in | Wt 245.0 lb

## 2020-10-30 DIAGNOSIS — E782 Mixed hyperlipidemia: Secondary | ICD-10-CM | POA: Diagnosis not present

## 2020-10-30 DIAGNOSIS — I10 Essential (primary) hypertension: Secondary | ICD-10-CM | POA: Diagnosis not present

## 2020-10-30 DIAGNOSIS — N390 Urinary tract infection, site not specified: Secondary | ICD-10-CM | POA: Diagnosis not present

## 2020-10-30 DIAGNOSIS — E1165 Type 2 diabetes mellitus with hyperglycemia: Secondary | ICD-10-CM | POA: Diagnosis not present

## 2020-10-30 NOTE — Progress Notes (Signed)
Metrics: Intervention Frequency ACO  Documented Smoking Status Yearly  Screened one or more times in 24 months  Cessation Counseling or  Active cessation medication Past 24 months  Past 24 months   Guideline developer: UpToDate (See UpToDate for funding source) Date Released: 2014       Wellness Office Visit  Subjective:  Patient ID: Melanie Spencer, female    DOB: May 04, 1961  Age: 60 y.o. MRN: [id number]  CC: This lady comes in for follow-up of diabetes, hypertension, dyslipidemia. HPI  She recently had a urine infection with E. coli and was treated appropriately.  Her symptoms have completely resolved now. She continues on insulin, Metformin and Trulicity for her type 2 diabetes. Her hemoglobin A1c was above 8% on the last visit. She continues on Altace for her hypertension. She continues on simvastatin for dyslipidemia in the face of diabetes. Past Medical History:  Diagnosis Date  . Cerebrovascular disease 05/23/2019  . Essential hypertension, benign 05/23/2019  . History of stroke 2007   left leg weakness  . HLD (hyperlipidemia) 05/23/2019  . Morbid obesity (HCC) 05/23/2019  . Type II diabetes mellitus, uncontrolled (HCC) 05/23/2019  . Vitamin D deficiency    Past Surgical History:  Procedure Laterality Date  . AMPUTATION Right 11/09/2019   Procedure: 5TH AMPUTATION RAY;  Surgeon: Vivi Barrack, DPM;  Location: [facility] OR;  Service: Podiatry;  Laterality: Right;  . CHOLECYSTECTOMY       Family History  Problem Relation Age of Onset  . Heart disease Mother   . Diabetes Mother   . Hypertension Mother   . Cancer Father        Lung  . Hypertension Sister   . Macular degeneration Sister   . Appendicitis Brother   . Hypertension Sister   . 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   Social History   Tobacco Use  . Smoking status: Former Smoker     Packs/day: 2.00    Years: 20.00    Pack years: 40.00    Types: Cigarettes    Quit date: 07/26/2005    Years since quitting: 15.2  . Smokeless tobacco: Never Used  Substance Use Topics  . Alcohol use: No    Alcohol/week: 0.0 standard drinks    Current Meds  Medication Sig  . aspirin 325 MG tablet Take 325 mg by mouth daily.  . cholecalciferol (VITAMIN D) 1000 UNITS tablet Take 1,000 Units by mouth daily.   . fluticasone (FLONASE) 50 MCG/ACT nasal spray SHAKE LIQUID AND USE 2 SPRAYS IN EACH NOSTRIL EVERY DAY  . LANTUS SOLOSTAR 100 UNIT/ML Solostar Pen ADMINISTER 40 UNITS UNDER THE SKIN AT BEDTIME  . metFORMIN (GLUCOPHAGE) 1000 MG tablet TAKE 1 TABLET BY MOUTH TWICE DAILY  . omeprazole (PRILOSEC) 20 MG capsule TAKE 1 CAPSULE BY MOUTH DAILY  . oxybutynin (DITROPAN) 5 MG tablet TAKE 1 TABLET(5 MG) BY MOUTH THREE TIMES DAILY  . ramipril (ALTACE) 10 MG capsule TAKE 2 CAPSULES(20 MG) BY MOUTH DAILY  . simvastatin (ZOCOR) 40 MG tablet TAKE 1 TABLET(40 MG) BY MOUTH AT BEDTIME  . TRULICITY 1.5 MG/0.5ML SOPN ADMINISTER 1.5 ML UNDER THE SKIN 1 TIME A WEEK     Flowsheet Row Office Visit from 10/30/2020 in [facility]  PHQ-9 Total Score 0      Objective:   Today's Vitals: BP (!) 142/78   Pulse 66  Temp (!) 96.9 F (36.1 C) (Temporal)   Ht 5' 5.5" (1.664 m)   Wt 245 lb (111.1 kg)   LMP 03/02/2012   SpO2 96%   BMI 40.15 kg/m  Vitals with BMI 10/30/2020 10/20/2020 09/24/2020  Height 5' 5.5" 5' 6.5" 5' 5.5"  Weight 245 lbs 248 lbs (No Data)  BMI 40.14 39.43 -  Systolic 142 - (No Data)  Diastolic 78 - (No Data)  Pulse 66 - (No Data)     Physical Exam  Looks systemically well.  She remains morbidly obese.  Blood pressure is slightly elevated.     Assessment   1. Urinary tract infection without hematuria, site unspecified   2. Uncontrolled type 2 diabetes mellitus with hyperglycemia (HCC)   3. Essential hypertension, benign   4. Mixed hyperlipidemia       Tests  ordered Orders Placed This Encounter  Procedures  . COMPLETE METABOLIC PANEL WITH GFR  . Hemoglobin A1c  . Urinalysis w microscopic + reflex cultur     Plan: 1. We will check urinalysis to make sure the urine infection has cleared. 2. Continue with all diabetic medications and we will check an A1c. 3. Continue with Altace for hypertension which is reasonably controlled.  Check renal function. 4. Continue with simvastatin for her dyslipidemia. 5. Further recommendations will depend on blood results and she will follow up with Sarah in about 3 months time.   No orders of the defined types were placed in this encounter.   [name], MD

## 2020-10-31 LAB — URINALYSIS W MICROSCOPIC + REFLEX CULTURE
Bacteria, UA: NONE SEEN /HPF
Bilirubin Urine: NEGATIVE
Hgb urine dipstick: NEGATIVE
Hyaline Cast: NONE SEEN /LPF
Leukocyte Esterase: NEGATIVE
Nitrites, Initial: NEGATIVE
RBC / HPF: NONE SEEN /HPF (ref 0–2)
Specific Gravity, Urine: 1.024 (ref 1.001–1.03)
Squamous Epithelial / HPF: NONE SEEN /HPF (ref <=5)
WBC, UA: NONE SEEN /HPF (ref 0–5)
pH: 5.5 (ref 5.0–8.0)

## 2020-10-31 LAB — COMPLETE METABOLIC PANEL WITH GFR
AG Ratio: 1.3 (calc) (ref 1.0–2.5)
ALT: 15 U/L (ref 6–29)
AST: 13 U/L (ref 10–35)
Albumin: 4 g/dL (ref 3.6–5.1)
Alkaline phosphatase (APISO): 85 U/L (ref 37–153)
BUN/Creatinine Ratio: 26 (calc) — ABNORMAL HIGH (ref 6–22)
BUN: 28 mg/dL — ABNORMAL HIGH (ref 7–25)
CO2: 26 mmol/L (ref 20–32)
Calcium: 9.4 mg/dL (ref 8.6–10.4)
Chloride: 103 mmol/L (ref 98–110)
Creat: 1.07 mg/dL — ABNORMAL HIGH (ref 0.50–0.99)
GFR, Est African American: 65 mL/min/{1.73_m2} (ref >=60)
GFR, Est Non African American: 56 mL/min/{1.73_m2} — ABNORMAL LOW (ref >=60)
Globulin: 3.1 g/dL (calc) (ref 1.9–3.7)
Glucose, Bld: 157 mg/dL — ABNORMAL HIGH (ref 65–139)
Potassium: 5 mmol/L (ref 3.5–5.3)
Sodium: 139 mmol/L (ref 135–146)
Total Bilirubin: 0.3 mg/dL (ref 0.2–1.2)
Total Protein: 7.1 g/dL (ref 6.1–8.1)

## 2020-10-31 LAB — HEMOGLOBIN A1C
Hgb A1c MFr Bld: 9.2 % of total Hgb — ABNORMAL HIGH (ref <5.7)
Mean Plasma Glucose: 217 mg/dL
eAG (mmol/L): 12 mmol/L

## 2020-10-31 LAB — NO CULTURE INDICATED

## 2020-11-03 NOTE — Progress Notes (Signed)
Let the patient know that her UTI has cleared BUT her diabetes is getting worse.She needs to focus on her diet more.

## 2020-11-10 ENCOUNTER — Other Ambulatory Visit (HOSPITAL_COMMUNITY)
Admission: RE | Admit: 2020-11-10 | Discharge: 2020-11-10 | Disposition: A | Discharge status: Home / Self Care | Payer: Medicare PPO | Source: Ambulatory Visit | Attending: Internal Medicine | Admitting: Internal Medicine

## 2020-11-10 ENCOUNTER — Other Ambulatory Visit: Payer: Self-pay

## 2020-11-10 ENCOUNTER — Other Ambulatory Visit (INDEPENDENT_AMBULATORY_CARE_PROVIDER_SITE_OTHER): Payer: Self-pay | Admitting: Internal Medicine

## 2020-11-10 DIAGNOSIS — N3941 Urge incontinence: Secondary | ICD-10-CM

## 2020-11-10 DIAGNOSIS — Z01812 Encounter for preprocedural laboratory examination: Secondary | ICD-10-CM | POA: Diagnosis not present

## 2020-11-10 DIAGNOSIS — Z20822 Contact with and (suspected) exposure to covid-19: Secondary | ICD-10-CM | POA: Diagnosis not present

## 2020-11-11 LAB — SARS CORONAVIRUS 2 (TAT 6-24 HRS): SARS Coronavirus 2: NEGATIVE

## 2020-11-12 ENCOUNTER — Encounter (HOSPITAL_COMMUNITY): Payer: Self-pay | Admitting: Internal Medicine

## 2020-11-12 ENCOUNTER — Encounter (HOSPITAL_COMMUNITY)
Admission: RE | Disposition: A | Discharge status: Home / Self Care | Payer: Self-pay | Source: Ambulatory Visit | Attending: Internal Medicine

## 2020-11-12 ENCOUNTER — Ambulatory Visit (HOSPITAL_COMMUNITY)
Admission: RE | Admit: 2020-11-12 | Discharge: 2020-11-12 | Disposition: A | Discharge status: Home / Self Care | Payer: Medicare PPO | Source: Ambulatory Visit | Attending: Internal Medicine | Admitting: Internal Medicine

## 2020-11-12 ENCOUNTER — Telehealth: Payer: Self-pay | Admitting: Internal Medicine

## 2020-11-12 ENCOUNTER — Other Ambulatory Visit: Payer: Self-pay

## 2020-11-12 DIAGNOSIS — K635 Polyp of colon: Secondary | ICD-10-CM

## 2020-11-12 DIAGNOSIS — I10 Essential (primary) hypertension: Secondary | ICD-10-CM | POA: Insufficient documentation

## 2020-11-12 DIAGNOSIS — Z79899 Other long term (current) drug therapy: Secondary | ICD-10-CM | POA: Diagnosis not present

## 2020-11-12 DIAGNOSIS — D122 Benign neoplasm of ascending colon: Secondary | ICD-10-CM | POA: Diagnosis not present

## 2020-11-12 DIAGNOSIS — E119 Type 2 diabetes mellitus without complications: Secondary | ICD-10-CM | POA: Insufficient documentation

## 2020-11-12 DIAGNOSIS — Z833 Family history of diabetes mellitus: Secondary | ICD-10-CM | POA: Diagnosis not present

## 2020-11-12 DIAGNOSIS — Z8249 Family history of ischemic heart disease and other diseases of the circulatory system: Secondary | ICD-10-CM | POA: Insufficient documentation

## 2020-11-12 DIAGNOSIS — Z89421 Acquired absence of other right toe(s): Secondary | ICD-10-CM | POA: Insufficient documentation

## 2020-11-12 DIAGNOSIS — Z1211 Encounter for screening for malignant neoplasm of colon: Secondary | ICD-10-CM | POA: Diagnosis not present

## 2020-11-12 DIAGNOSIS — Z8673 Personal history of transient ischemic attack (TIA), and cerebral infarction without residual deficits: Secondary | ICD-10-CM | POA: Diagnosis not present

## 2020-11-12 DIAGNOSIS — Z9049 Acquired absence of other specified parts of digestive tract: Secondary | ICD-10-CM | POA: Diagnosis not present

## 2020-11-12 DIAGNOSIS — D123 Benign neoplasm of transverse colon: Secondary | ICD-10-CM | POA: Diagnosis not present

## 2020-11-12 DIAGNOSIS — E785 Hyperlipidemia, unspecified: Secondary | ICD-10-CM | POA: Insufficient documentation

## 2020-11-12 DIAGNOSIS — Q438 Other specified congenital malformations of intestine: Secondary | ICD-10-CM | POA: Diagnosis not present

## 2020-11-12 DIAGNOSIS — Z87891 Personal history of nicotine dependence: Secondary | ICD-10-CM | POA: Insufficient documentation

## 2020-11-12 DIAGNOSIS — Z794 Long term (current) use of insulin: Secondary | ICD-10-CM | POA: Diagnosis not present

## 2020-11-12 DIAGNOSIS — Z7982 Long term (current) use of aspirin: Secondary | ICD-10-CM | POA: Insufficient documentation

## 2020-11-12 HISTORY — PX: COLONOSCOPY: SHX5424

## 2020-11-12 HISTORY — PX: POLYPECTOMY: SHX5525

## 2020-11-12 LAB — GLUCOSE, CAPILLARY: Glucose-Capillary: 100 mg/dL — ABNORMAL HIGH (ref 70–99)

## 2020-11-12 SURGERY — COLONOSCOPY
Anesthesia: Moderate Sedation

## 2020-11-12 MED ORDER — ONDANSETRON HCL 4 MG/2ML IJ SOLN
INTRAMUSCULAR | Status: AC
  Filled 2020-11-12: qty 2

## 2020-11-12 MED ORDER — MEPERIDINE HCL 50 MG/ML IJ SOLN
INTRAMUSCULAR | Status: AC
  Filled 2020-11-12: qty 1

## 2020-11-12 MED ORDER — STERILE WATER FOR IRRIGATION IR SOLN
Status: DC | PRN
  Administered 2020-11-12 (×2): 1.5 mL

## 2020-11-12 MED ORDER — MIDAZOLAM HCL 5 MG/5ML IJ SOLN
INTRAMUSCULAR | Status: AC
  Filled 2020-11-12: qty 10

## 2020-11-12 MED ORDER — ONDANSETRON HCL 4 MG/2ML IJ SOLN
INTRAMUSCULAR | Status: DC | PRN
  Administered 2020-11-12: 4 mg via INTRAVENOUS

## 2020-11-12 MED ORDER — MIDAZOLAM HCL 5 MG/5ML IJ SOLN
INTRAMUSCULAR | Status: DC | PRN
  Administered 2020-11-12: 1 mg via INTRAVENOUS

## 2020-11-12 MED ORDER — MEPERIDINE HCL 100 MG/ML IJ SOLN
INTRAMUSCULAR | Status: DC | PRN
  Administered 2020-11-12: 25 mg via INTRAVENOUS

## 2020-11-12 MED ORDER — SODIUM CHLORIDE 0.9 % IV SOLN: INTRAVENOUS | Status: DC

## 2020-11-12 NOTE — H&P (Signed)
@LOGO @   Primary Care Physician:  , NP Primary Gastroenterologist:  Dr. Elenore Paddy  Pre-Procedure History & Physical: HPI:  Melanie Spencer is a 60 y.o. female is here for a screening colonoscopy.  Negative colonoscopy 2009.  No bowel symptoms currently.  Past Medical History:  Diagnosis Date  . Cerebrovascular disease 05/23/2019  . Essential hypertension, benign 05/23/2019  . History of stroke 2007   left leg weakness  . HLD (hyperlipidemia) 05/23/2019  . Morbid obesity (HCC) 05/23/2019  . Type II diabetes mellitus, uncontrolled (HCC) 05/23/2019  . Vitamin D deficiency     Past Surgical History:  Procedure Laterality Date  . AMPUTATION Right 11/09/2019   Procedure: 5TH AMPUTATION RAY;  Surgeon: 11/11/2019, DPM;  Location: [facility] OR;  Service: Podiatry;  Laterality: Right;  . CHOLECYSTECTOMY      Prior to Admission medications   Medication Sig Start Date End Date Taking? Authorizing Provider  cholecalciferol (VITAMIN D) 1000 UNITS tablet Take 1,000 Units by mouth daily.    Yes [provider]  fluticasone (FLONASE) 50 MCG/ACT nasal spray SHAKE LIQUID AND USE 2 SPRAYS IN [facility] NOSTRIL EVERY DAY 10/13/20 11/12/20 Yes 11/14/20, NP  gabapentin (NEURONTIN) 300 MG capsule TAKE 2 CAPSULES BY MOUTH THREE TIMES DAILY 11/10/20 12/10/20 Yes 12/12/20, NP  LANTUS SOLOSTAR 100 UNIT/ML Solostar Pen ADMINISTER 40 UNITS UNDER THE SKIN AT BEDTIME 09/11/20  Yes Gosrani, Nimish C, MD  omeprazole (PRILOSEC) 20 MG capsule TAKE 1 CAPSULE BY MOUTH DAILY 11/10/20  Yes 11/12/20, NP  oxybutynin (DITROPAN) 5 MG tablet TAKE 1 TABLET(5 MG) BY MOUTH THREE TIMES DAILY 11/10/20  Yes 11/12/20, NP  ramipril (ALTACE) 10 MG capsule TAKE 2 CAPSULES(20 MG) BY MOUTH DAILY 09/18/20  Yes Gosrani, Nimish C, MD  simvastatin (ZOCOR) 40 MG tablet TAKE 1 TABLET(40 MG) BY MOUTH AT BEDTIME 11/10/20  Yes 11/12/20, NP  TRULICITY 1.5 MG/0.5ML SOPN ADMINISTER 1.5 ML UNDER THE SKIN 1 TIME A WEEK  08/14/20  Yes Gosrani, Nimish C, MD  aspirin 325 MG tablet Take 325 mg by mouth daily.    [provider]  metFORMIN (GLUCOPHAGE) 1000 MG tablet TAKE 1 TABLET BY MOUTH TWICE DAILY 09/11/20 10/11/20  10/13/20, MD    Allergies as of 10/22/2020  . (No Known Allergies)    Family History  Problem Relation Age of Onset  . Heart disease Mother   . Diabetes Mother   . Hypertension Mother   . Cancer Father        Lung  . Hypertension Sister   . Macular degeneration Sister   . Appendicitis Brother   . Hypertension Sister   . Diabetes Sister     Social History   Socioeconomic History  . Marital status: Married    Spouse name: Not on file  . Number of children: Not on file  . Years of education: 68  . Highest education level: Some college, no degree  Occupational History  . Occupation: Disability    Comment: Used to work as 13  Tobacco Use  . Smoking status: Former Smoker    Packs/day: 2.00    Years: 20.00    Pack years: 40.00    Types: Cigarettes    Quit date: 07/26/2005    Years since quitting: 15.3  . Smokeless tobacco: Never Used  Vaping Use  . Vaping Use: Never used  Substance and Sexual Activity  . Alcohol use: No  Alcohol/week: 0.0 standard drinks  . Drug use: No  . Sexual activity: Not Currently    Birth control/protection: Post-menopausal  Other Topics Concern  . Not on file  Social History Narrative   Married for 31 yrs.On disability secondary to CVA.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   Social Determinants of Health   Financial Resource Strain: Not on file  Food Insecurity: Not on file  Transportation Needs: Not on file  Physical Activity: Not on file  Stress: Not on file  Social Connections: Not on file  Intimate Partner Violence: Not on file    Review of Systems: See HPI, otherwise negative ROS  Physical Exam: LMP 03/02/2012  General:   Alert,  Well-developed, well-nourished, pleasant and cooperative in NAD Neck:   Supple; no masses or thyromegaly. Lungs:  Clear throughout to auscultation.   No wheezes, crackles, or rhonchi. No acute distress. Heart:  Regular rate and rhythm; no murmurs, clicks, rubs,  or gallops. Abdomen:  Soft, nontender and nondistended. No masses, hepatosplenomegaly or hernias noted. Normal bowel sounds, without guarding, and without rebound.    Impression/Plan: Melanie Spencer is now here to undergo a screening colonoscopy.  Average risk screening examination.  Risks, benefits, limitations, imponderables and alternatives regarding colonoscopy have been reviewed with the patient. Questions have been answered. All parties agreeable.     Notice:  This dictation was prepared with Dragon dictation along with smaller phrase technology. Any transcriptional errors that result from this process are unintentional and may not be corrected upon review.

## 2020-11-12 NOTE — Op Note (Signed)
[facility] Patient Name: Melanie Spencer Procedure Date: 11/12/2020 12:48 PM MRN: [id number] Date of Birth: 28-Sep-1960 Attending MD: Gennette Pac , MD CSN: [id number] Age: 60 Admit Type: Outpatient Procedure:                Colonoscopy Indications:              Screening for colorectal malignant neoplasm Providers:                Gennette Pac, MD, Angelica Ran, Pandora Leiter, Technician Referring MD:              Medicines:                Midazolam 1 mg IV, Meperidine 25 mg IV Complications:            No immediate complications. Estimated Blood Loss:     Estimated blood loss was minimal. Procedure:                Pre-Anesthesia Assessment:                           - Prior to the procedure, a History and Physical                            was performed, and patient medications and                            allergies were reviewed. The patient's tolerance of                            previous anesthesia was also reviewed. The risks                            and benefits of the procedure and the sedation                            options and risks were discussed with the patient.                            All questions were answered, and informed consent                            was obtained. Prior Anticoagulants: The patient has                            taken no previous anticoagulant or antiplatelet                            agents. ASA Grade Assessment: III - A patient with                            severe systemic disease. After reviewing the risks  and benefits, the patient was deemed in                            satisfactory condition to undergo the procedure.                           After obtaining informed consent, the colonoscope                            was passed under direct vision. Throughout the                            procedure, the patient's blood pressure, pulse, and                             oxygen saturations were monitored continuously. The                            CF-HQ190L ([id number] scope was introduced through                            the anus and advanced to the the cecum, identified                            by appendiceal orifice and ileocecal valve. The                            colonoscopy was performed without difficulty. The                            patient tolerated the procedure well. The quality                            of the bowel preparation was adequate. Scope In: 1:13:20 PM Scope Out: 1:36:55 PM Scope Withdrawal Time: 0 hours 12 minutes 39 seconds  Total Procedure Duration: 0 hours 23 minutes 35 seconds  Findings:      The perianal and digital rectal examinations were normal. Somewhat       elongated, redundant colon      Three semi-pedunculated polyps were found in the splenic flexure and       ascending colon. The polyps were 5 to 6 mm in size. These polyps were       removed with a cold snare. Resection and retrieval were complete.       Estimated blood loss was minimal.      The exam was otherwise without abnormality on direct and retroflexion       views. Impression:               - Three 5 to 6 mm polyps at the splenic flexure and                            in the ascending colon, removed with a cold snare.  Resected and retrieved. Redundant colon.                           - The examination was otherwise normal on direct                            and retroflexion views. Moderate Sedation:      Moderate (conscious) sedation was administered by the endoscopy nurse       and supervised by the endoscopist. The following parameters were       monitored: oxygen saturation, heart rate, blood pressure, and response       to care. Recommendation:           - Patient has a contact number available for                            emergencies. The signs and symptoms of potential                             delayed complications were discussed with the                            patient. Return to normal activities tomorrow.                            Written discharge instructions were provided to the                            patient.                           - Resume previous diet.                           - Continue present medications.                           - Await pathology results.                           - Repeat colonoscopy date to be determined after                            pending pathology results are reviewed for                            surveillance.                           - Return to GI office after studies are complete. Procedure Code(s):        --- Professional ---                           [phone], Colonoscopy, flexible; with removal of                            tumor(s), polyp(s), or other  lesion(s) by snare                            technique Diagnosis Code(s):        --- Professional ---                           Z12.11, Encounter for screening for malignant                            neoplasm of colon                           K63.5, Polyp of colon CPT copyright 2019 American Medical Association. All rights reserved. The codes documented in this report are preliminary and upon coder review may  be revised to meet current compliance requirements. Gerrit Friends. Mykhia Danish, MD Gennette Pac, MD 11/12/2020 1:47:53 PM This report has been signed electronically. Number of Addenda: 0

## 2020-11-12 NOTE — Telephone Encounter (Signed)
Tried to call pt back again but she didn't answer.  LMOM

## 2020-11-12 NOTE — Telephone Encounter (Signed)
Tried to call pt again but mailbox full.

## 2020-11-12 NOTE — Discharge Instructions (Signed)
Colonoscopy Discharge Instructions  Read the instructions outlined below and refer to this sheet in the next few weeks. These discharge instructions provide you with general information on caring for yourself after you leave the hospital. Your doctor may also give you specific instructions. While your treatment has been planned according to the most current medical practices available, unavoidable complications occasionally occur. If you have any problems or questions after discharge, call Dr. Jena Gauss at [phone]. ACTIVITY  You may resume your regular activity, but move at a slower pace for the next 24 hours.   Take frequent rest periods for the next 24 hours.   Walking will help get rid of the air and reduce the bloated feeling in your belly (abdomen).   No driving for 24 hours (because of the medicine (anesthesia) used during the test).    Do not sign any important legal documents or operate any machinery for 24 hours (because of the anesthesia used during the test).  NUTRITION  Drink plenty of fluids.   You may resume your normal diet as instructed by your doctor.   Begin with a light meal and progress to your normal diet. Heavy or fried foods are harder to digest and may make you feel sick to your stomach (nauseated).   Avoid alcoholic beverages for 24 hours or as instructed.  MEDICATIONS  You may resume your normal medications unless your doctor tells you otherwise.  WHAT YOU CAN EXPECT TODAY  Some feelings of bloating in the abdomen.   Passage of more gas than usual.   Spotting of blood in your stool or on the toilet paper.  IF YOU HAD POLYPS REMOVED DURING THE COLONOSCOPY:  No aspirin products for 7 days or as instructed.   No alcohol for 7 days or as instructed.   Eat a soft diet for the next 24 hours.  FINDING OUT THE RESULTS OF YOUR TEST Not all test results are available during your visit. If your test results are not back during the visit, make an appointment  with your caregiver to find out the results. Do not assume everything is normal if you have not heard from your caregiver or the medical facility. It is important for you to follow up on all of your test results.  SEEK IMMEDIATE MEDICAL ATTENTION IF:  You have more than a spotting of blood in your stool.   Your belly is swollen (abdominal distention).   You are nauseated or vomiting.   You have a temperature over 101.   You have abdominal pain or discomfort that is severe or gets worse throughout the day.   3 polyps removed from your colon today  Further recommendations to follow pending review of pathology report  At patient request, called Evderitt Szymczak at [phone] voicemail  PATIENT INSTRUCTIONS POST-ANESTHESIA  IMMEDIATELY FOLLOWING SURGERY:  Do not drive or operate machinery for the first twenty four hours after surgery.  Do not make any important decisions for twenty four hours after surgery or while taking narcotic pain medications or sedatives.  If you develop intractable nausea and vomiting or a severe headache please notify your doctor immediately.  FOLLOW-UP:  Please make an appointment with your surgeon as instructed. You do not need to follow up with anesthesia unless specifically instructed to do so.  WOUND CARE INSTRUCTIONS (if applicable):  Keep a dry clean dressing on the anesthesia/puncture wound site if there is drainage.  Once the wound has quit draining you may leave it open to air.  Generally you should leave the bandage intact for twenty four hours unless there is drainage.  If the epidural site drains for more than 36-48 hours please call the anesthesia department.  QUESTIONS?:  Please feel free to call your physician or the hospital operator if you have any questions, and they will be happy to assist you.      Colon Polyps  Colon polyps are tissue growths inside the colon, which is part of the large intestine. They are one of the types of polyps  that can grow in the body. A polyp may be a round bump or a mushroom-shaped growth. You could have one polyp or more than one. Most colon polyps are noncancerous (benign). However, some colon polyps can become cancerous over time. Finding and removing the polyps early can help prevent this. What are the causes? The exact cause of colon polyps is not known. What increases the risk? The following factors may make you more likely to develop this condition:  Having a family history of colorectal cancer or colon polyps.  Being older than 61 years of age.  Being younger than 61 years of age and having a significant family history of colorectal cancer or colon polyps or a genetic condition that puts you at higher risk of getting colon polyps.  Having inflammatory bowel disease, such as ulcerative colitis or Crohn's disease.  Having certain conditions passed from parent to child (hereditary conditions), such as: ? Familial adenomatous polyposis (FAP). ? Lynch syndrome. ? Turcot syndrome. ? Peutz-Jeghers syndrome. ? MUTYH-associated polyposis (MAP).  Being overweight.  Certain lifestyle factors. These include smoking cigarettes, drinking too much alcohol, not getting enough exercise, and eating a diet that is high in fat and red meat and low in fiber.  Having had childhood cancer that was treated with radiation of the abdomen. What are the signs or symptoms? Many times, there are no symptoms. If you have symptoms, they may include:  Blood coming from the rectum during a bowel movement.  Blood in the stool (feces). The blood may be bright red or very dark in color.  Pain in the abdomen.  A change in bowel habits, such as constipation or diarrhea. How is this diagnosed? This condition is diagnosed with a colonoscopy. This is a procedure in which a lighted, flexible scope is inserted into the opening between the buttocks (anus) and then passed into the colon to examine the area. Polyps are  sometimes found when a colonoscopy is done as part of routine cancer screening tests. How is this treated? This condition is treated by removing any polyps that are found. Most polyps can be removed during a colonoscopy. Those polyps will then be tested for cancer. Additional treatment may be needed depending on the results of testing. Follow these instructions at home: Eating and drinking  Eat foods that are high in fiber, such as fruits, vegetables, and whole grains.  Eat foods that are high in calcium and vitamin D, such as milk, cheese, yogurt, eggs, liver, fish, and broccoli.  Limit foods that are high in fat, such as fried foods and desserts.  Limit the amount of red meat, precooked or cured meat, or other processed meat that you eat, such as hot dogs, sausages, bacon, or meat loaves.  Limit sugary drinks.   Lifestyle  Maintain a healthy weight, or lose weight if recommended by your health care provider.  Exercise every day or as told by your health care provider.  Do not use any products  that contain nicotine or tobacco, such as cigarettes, e-cigarettes, and chewing tobacco. If you need help quitting, ask your health care provider.  Do not drink alcohol if: ? Your health care provider tells you not to drink. ? You are pregnant, may be pregnant, or are planning to become pregnant.  If you drink alcohol: ? Limit how much you use to:  0-1 drink a day for women.  0-2 drinks a day for men. ? Know how much alcohol is in your drink. In the [place] one drink equals one 12 oz bottle of beer (355 mL), one 5 oz glass of wine (148 mL), or one 1 oz glass of hard liquor (44 mL). General instructions  Take over-the-counter and prescription medicines only as told by your health care provider.  Keep all follow-up visits. This is important. This includes having regularly scheduled colonoscopies. Talk to your health care provider about when you need a colonoscopy. Contact a health care  provider if:  You have new or worsening bleeding during a bowel movement.  You have new or increased blood in your stool.  You have a change in bowel habits.  You lose weight for no known reason. Summary  Colon polyps are tissue growths inside the colon, which is part of the large intestine. They are one type of polyp that can grow in the body.  Most colon polyps are noncancerous (benign), but some can become cancerous over time.  This condition is diagnosed with a colonoscopy.  This condition is treated by removing any polyps that are found. Most polyps can be removed during a colonoscopy. This information is not intended to replace advice given to you by your health care provider. Make sure you discuss any questions you have with your health care provider. Document Revised: 10/31/2019 Document Reviewed: 10/31/2019 Elsevier Patient Education  2021 [employer].

## 2020-11-12 NOTE — Telephone Encounter (Signed)
Tried to call pt.  Mailbox full and could not accept any messages.

## 2020-11-12 NOTE — Telephone Encounter (Signed)
Pt is scheduled procedure today and has a question about a medication if she should take it or not. Please call her at [phone]

## 2020-11-12 NOTE — Telephone Encounter (Signed)
Tried to call pt again but had to leave a voice mail.

## 2020-11-14 ENCOUNTER — Encounter (HOSPITAL_COMMUNITY): Payer: Self-pay | Admitting: Internal Medicine

## 2020-11-14 LAB — SURGICAL PATHOLOGY

## 2020-11-15 ENCOUNTER — Encounter: Payer: Self-pay | Admitting: Internal Medicine

## 2020-11-17 ENCOUNTER — Telehealth: Payer: Self-pay | Admitting: Internal Medicine

## 2020-11-17 ENCOUNTER — Telehealth: Payer: Self-pay

## 2020-11-17 NOTE — Telephone Encounter (Signed)
Received a VM from pt on 11/17/20. Pt had a TCS on 11/12/20 and states she now needs an antibiotic to treat a UTI. Pt states she got the UTI from having the prep and using the bathroom so much. Please advise.

## 2020-11-17 NOTE — Telephone Encounter (Signed)
Spoke with pt. See other phone note for documentation.

## 2020-11-17 NOTE — Telephone Encounter (Signed)
Pt returning call. [phone]

## 2020-11-17 NOTE — Telephone Encounter (Signed)
Communication noted.  She has a history of recurrent urinary tract infections.  Recent colonoscopy may or may not be related to most recent symptoms.  Dr. Karilyn Cota  has been managing recurrent UTIs..  She should follow-up with him.

## 2020-11-17 NOTE — Telephone Encounter (Signed)
Noted. Spoke with pt. Pt talked with her doctor and was instructed to try otc Azo. Pt will contact them again if that doesn't help.

## 2020-11-27 ENCOUNTER — Telehealth (INDEPENDENT_AMBULATORY_CARE_PROVIDER_SITE_OTHER): Payer: Self-pay

## 2020-11-27 DIAGNOSIS — N390 Urinary tract infection, site not specified: Secondary | ICD-10-CM

## 2020-11-27 MED ORDER — CIPROFLOXACIN HCL 250 MG PO TABS: 250.0000 mg | ORAL_TABLET | Freq: Two times a day (BID) | ORAL | 0 refills | Status: AC

## 2020-11-27 NOTE — Telephone Encounter (Signed)
As you know I do not like to send prescription medications and for patients without doing some sort of visit with them.  However since we are going into the weekend I will order ciprofloxacin as she can take 1 tablet by mouth twice a day for 3 days.  If her symptoms persist into Monday she will need to be seen for evaluation.  Also, if she has signs of urinary tract infection again she needs to be evaluated in the office as soon as possible and avoid waiting until Thursday afternoon to notify us.  If this happens again she will have to go to urgent care, also ask her if she would like to refer to urology for evaluation of frequent UTIs as I do see she has had multiple in the last year.  Please also warn her of black box warning of ciprofloxacin including (thus if she experiences any joint pain then she needs to stop the medication), neuropathy, and risk of abdominal infection called C. difficile which resulted in severe diarrhea.  If she feels unwell medicine she should stop it and notify us first thing Monday morning.  If she starts to experience fever, nausea, vomiting, flank pain, or blood in her urine she should go to the urgent care over the weekend.  Ciprofloxacin was sent to the [facility] on [address] thank you.

## 2020-11-27 NOTE — Telephone Encounter (Signed)
Patient called and left a detailed voice message that she has a bad UTI infection again and having the same symptoms and wants to know if you can send a prescription in for her UTI? Patient stated that she had been using AZO and not helping anymore.

## 2020-11-27 NOTE — Telephone Encounter (Signed)
I tried to call patient and I could hear them but they could not hear me. I will try back another time.  Called back and left a detailed voice message for patient and I will check back with her on Monday.

## 2020-12-02 ENCOUNTER — Telehealth (INDEPENDENT_AMBULATORY_CARE_PROVIDER_SITE_OTHER): Payer: Self-pay

## 2020-12-02 ENCOUNTER — Other Ambulatory Visit (INDEPENDENT_AMBULATORY_CARE_PROVIDER_SITE_OTHER): Payer: Self-pay | Admitting: Internal Medicine

## 2020-12-02 MED ORDER — INSULIN PEN NEEDLE 29G X 12.7MM MISC: 1.0000 [IU] | Freq: Every day | 3 refills | Status: DC

## 2020-12-02 NOTE — Telephone Encounter (Signed)
Spoke to patient today and she stated that she is feeling much better and thanked [place]. Patient has been having trouble with her phone and was not able to hear [place] last week.

## 2020-12-02 NOTE — Telephone Encounter (Signed)
Name of pen needles:  BD Ultra Fine Original Pen Needles 12.7 mm in length 29g size needle

## 2020-12-02 NOTE — Telephone Encounter (Signed)
Okay, I have sent that to the [facility] on [address]  Thanks.

## 2020-12-02 NOTE — Telephone Encounter (Signed)
Patients husband called and stated that her phone is not working well and he wanted to let us know that the patient needs a refill of the Pen Needles for her insulin to be sent to [facility] on [address]

## 2020-12-02 NOTE — Telephone Encounter (Signed)
I need to know the exact pen needles she needs-size/name etc.

## 2020-12-08 ENCOUNTER — Other Ambulatory Visit (INDEPENDENT_AMBULATORY_CARE_PROVIDER_SITE_OTHER): Payer: Self-pay | Admitting: Internal Medicine

## 2020-12-08 ENCOUNTER — Other Ambulatory Visit (INDEPENDENT_AMBULATORY_CARE_PROVIDER_SITE_OTHER): Payer: Self-pay

## 2020-12-09 ENCOUNTER — Ambulatory Visit (INDEPENDENT_AMBULATORY_CARE_PROVIDER_SITE_OTHER): Payer: Medicare PPO

## 2020-12-09 ENCOUNTER — Other Ambulatory Visit (INDEPENDENT_AMBULATORY_CARE_PROVIDER_SITE_OTHER): Payer: Self-pay | Admitting: Internal Medicine

## 2020-12-09 ENCOUNTER — Ambulatory Visit (INDEPENDENT_AMBULATORY_CARE_PROVIDER_SITE_OTHER): Payer: Medicare PPO | Admitting: *Deleted

## 2020-12-09 ENCOUNTER — Ambulatory Visit: Payer: Medicare PPO | Admitting: Podiatry

## 2020-12-09 ENCOUNTER — Other Ambulatory Visit: Payer: Self-pay

## 2020-12-09 DIAGNOSIS — S82832A Other fracture of upper and lower end of left fibula, initial encounter for closed fracture: Secondary | ICD-10-CM | POA: Diagnosis not present

## 2020-12-09 DIAGNOSIS — Z899 Acquired absence of limb, unspecified: Secondary | ICD-10-CM | POA: Diagnosis not present

## 2020-12-09 DIAGNOSIS — S99912A Unspecified injury of left ankle, initial encounter: Secondary | ICD-10-CM

## 2020-12-09 DIAGNOSIS — I739 Peripheral vascular disease, unspecified: Secondary | ICD-10-CM

## 2020-12-09 DIAGNOSIS — E1149 Type 2 diabetes mellitus with other diabetic neurological complication: Secondary | ICD-10-CM | POA: Diagnosis not present

## 2020-12-09 DIAGNOSIS — S99922A Unspecified injury of left foot, initial encounter: Secondary | ICD-10-CM

## 2020-12-09 DIAGNOSIS — M2042 Other hammer toe(s) (acquired), left foot: Secondary | ICD-10-CM

## 2020-12-09 DIAGNOSIS — L97512 Non-pressure chronic ulcer of other part of right foot with fat layer exposed: Secondary | ICD-10-CM

## 2020-12-09 MED ORDER — TRULICITY 4.5 MG/0.5ML ~~LOC~~ SOAJ: 4.5000 mg | SUBCUTANEOUS | 3 refills | Status: DC

## 2020-12-09 NOTE — Telephone Encounter (Signed)
Thank you :)

## 2020-12-09 NOTE — Progress Notes (Signed)
Patient presents to the office today for diabetic shoe and insole measuring.  Patient was measured with brannock device to determine size and width for 1 pair of extra depth shoes and foam casted for 3 pair of insoles.   Documentation of medical necessity will be sent to patient's treating diabetic doctor to verify and sign.   Patient's diabetic provider: Dr. Lilly Cove  Shoes and insoles will be ordered at that time and patient will be notified for an appointment for fitting when they arrive.   Shoe size (per patient): 11 wide   Brannock measurement: 9C  Patient shoe selection-   1st choice:   Apex [id number]  2nd choice:  Orthofeet 986  Shoe size ordered: Women's 11 Wide  Patient is wearing a custom AFO brace on the left foot, ordered larger to accommodate

## 2020-12-09 NOTE — Telephone Encounter (Signed)
I have corrected the Trulicity dosing.  I have sent a new prescription to the [facility] on [address].  She should be taking Trulicity 4.5 mg (0.5 mL) subcutaneously once a week and this is the new prescription I have sent now.

## 2020-12-10 ENCOUNTER — Other Ambulatory Visit: Payer: Self-pay | Admitting: Podiatry

## 2020-12-10 ENCOUNTER — Other Ambulatory Visit (INDEPENDENT_AMBULATORY_CARE_PROVIDER_SITE_OTHER): Payer: Self-pay | Admitting: Internal Medicine

## 2020-12-10 DIAGNOSIS — S99922A Unspecified injury of left foot, initial encounter: Secondary | ICD-10-CM

## 2020-12-15 NOTE — Progress Notes (Signed)
Subjective: 60 year old female presents the office today originally for further evaluation however she states that she twisted her left ankle on Sunday, Dec 07, 2020 and she is having swelling and soreness to the ankle.  She states that her leg gave out and she landed knee.  Describes a sharp sensation to the lateral aspect ankle that she points to.  No recent treatment. Denies any systemic complaints such as fevers, chills, nausea, vomiting. No acute changes since last appointment, and no other complaints at this time.   Objective: AAO x3, NAD DP/PT pulses palpable bilaterally, CRT less than 3 seconds Mild edema to the left ankle.  There is tenderness mostly to the lateral aspect of the ankle and distal portion of fibula.  No significant area of discomfort to the foot.  Flexor, extensor tendons appear to be intact.  No proximal tib-fib pain. Incision on the right foot is well-healed No pain with calf compression, swelling, warmth, erythema  Assessment: Left fibular fracture  Plan: -All treatment options discussed with the patient including all alternatives, risks, complications.  -X-rays obtained reviewed.  Avulsion fracture of the distal fibula noted. -Cam boot dispensed.  Limit activity, ice elevation. -Patient encouraged to call the office with any questions, concerns, change in symptoms.   Return in about 3 weeks (around 12/30/2020).  Repeat ankle x-rays  Vivi Barrack DPM

## 2020-12-17 ENCOUNTER — Other Ambulatory Visit: Payer: Self-pay | Admitting: Podiatry

## 2020-12-17 ENCOUNTER — Telehealth: Payer: Self-pay | Admitting: Podiatry

## 2020-12-17 MED ORDER — TRAMADOL HCL 50 MG PO TABS: 50.0000 mg | ORAL_TABLET | Freq: Two times a day (BID) | ORAL | 0 refills | Status: AC | PRN

## 2020-12-17 NOTE — Telephone Encounter (Signed)
I have sent tramadol to the pharmacy for her. Please let her know. Thanks.

## 2020-12-17 NOTE — Telephone Encounter (Signed)
Patient called the office stating she is having pain in her left foot while wearing a brace and wants to know if there is any medication that can be prescribed.

## 2020-12-17 NOTE — Telephone Encounter (Signed)
Called and spoke with the patient and relayed the message per Dr Wagoner. Melanie Spencer 

## 2020-12-27 ENCOUNTER — Other Ambulatory Visit (INDEPENDENT_AMBULATORY_CARE_PROVIDER_SITE_OTHER): Payer: Self-pay | Admitting: Internal Medicine

## 2020-12-30 ENCOUNTER — Telehealth (INDEPENDENT_AMBULATORY_CARE_PROVIDER_SITE_OTHER): Payer: Self-pay

## 2020-12-30 NOTE — Telephone Encounter (Signed)
Patient called and stated that she is having trouble with constipation and thinks this could be coming from her medications and wants to know if there is something she can take for her constipation to help her?

## 2020-12-30 NOTE — Telephone Encounter (Signed)
Called patient and gave her the message. Patient stated that she is not drinking enough water and does not eat green leafy vegetables enough and will try the Miralax and will call back if she has further concerns. Patient verbalized an understanding.

## 2020-12-30 NOTE — Telephone Encounter (Signed)
I would recommend that she make sure she is well-hydrated, eat plenty of green leafy vegetables.  She can try MiraLAX over-the-counter every single day for the next week.

## 2021-01-01 ENCOUNTER — Other Ambulatory Visit: Payer: Self-pay

## 2021-01-01 ENCOUNTER — Ambulatory Visit (INDEPENDENT_AMBULATORY_CARE_PROVIDER_SITE_OTHER): Payer: Medicare PPO

## 2021-01-01 ENCOUNTER — Ambulatory Visit (INDEPENDENT_AMBULATORY_CARE_PROVIDER_SITE_OTHER): Payer: Medicare PPO | Admitting: Podiatry

## 2021-01-01 DIAGNOSIS — S82832D Other fracture of upper and lower end of left fibula, subsequent encounter for closed fracture with routine healing: Secondary | ICD-10-CM | POA: Diagnosis not present

## 2021-01-01 DIAGNOSIS — S82832A Other fracture of upper and lower end of left fibula, initial encounter for closed fracture: Secondary | ICD-10-CM | POA: Diagnosis not present

## 2021-01-02 ENCOUNTER — Other Ambulatory Visit (INDEPENDENT_AMBULATORY_CARE_PROVIDER_SITE_OTHER): Payer: Self-pay | Admitting: Internal Medicine

## 2021-01-07 NOTE — Progress Notes (Signed)
Subjective: 60 year old female presents the office today for evaluation of left avulsion  fibular fracture.  Overall states that she is feeling better but is having some discomfort.  She feels that the brace/boot is giving support.  No recent injury or falls or changes otherwise. Denies any systemic complaints such as fevers, chills, nausea, vomiting. No acute changes since last appointment, and no other complaints at this time.   Objective: AAO x3, NAD DP/PT pulses palpable bilaterally, CRT less than 3 seconds There is minimal tenderness palpation of the distal aspect of the fibula on the left side.  There is no proximal tib-fib pain.  No pain to the fifth metatarsal base, metatarsals, talus or other areas of the foot.  Minimal edema.  There is no erythema or warmth. No pain with calf compression, swelling, warmth, erythema  Assessment: Avulsion fracture left fibula   Plan: -All treatment options discussed with the patient including all alternatives, risks, complications.  -Repeat x-rays obtained and reviewed.  There does appear to be some evidence of healing along the fracture site. -Recommend continue immobilization for now.  She is in the cam boot but she also has an [place] brace and this should provide enough stability as well.  She can wear the brace around the house but she is doing a lot of walking and I would try to wear the cam boot. -Patient encouraged to call the office with any questions, concerns, change in symptoms.   Vivi Barrack DPM

## 2021-01-19 ENCOUNTER — Other Ambulatory Visit (INDEPENDENT_AMBULATORY_CARE_PROVIDER_SITE_OTHER): Payer: Self-pay | Admitting: Internal Medicine

## 2021-01-20 ENCOUNTER — Encounter (INDEPENDENT_AMBULATORY_CARE_PROVIDER_SITE_OTHER): Payer: Self-pay | Admitting: Internal Medicine

## 2021-01-20 ENCOUNTER — Other Ambulatory Visit: Payer: Self-pay

## 2021-01-20 ENCOUNTER — Ambulatory Visit (INDEPENDENT_AMBULATORY_CARE_PROVIDER_SITE_OTHER): Payer: Medicare PPO | Admitting: Internal Medicine

## 2021-01-20 VITALS — BP 133/77 | HR 87 | Temp 97.8°F | Resp 18 | Ht 66.0 in | Wt 232.6 lb

## 2021-01-20 DIAGNOSIS — N3000 Acute cystitis without hematuria: Secondary | ICD-10-CM | POA: Diagnosis not present

## 2021-01-20 MED ORDER — CIPROFLOXACIN HCL 500 MG PO TABS: 500.0000 mg | ORAL_TABLET | Freq: Two times a day (BID) | ORAL | 0 refills | Status: DC

## 2021-01-20 NOTE — Progress Notes (Signed)
232 lb  today & 248 lb on last visit. UTI drinking water. Having very have hard UTI.

## 2021-01-20 NOTE — Progress Notes (Signed)
Metrics: Intervention Frequency ACO  Documented Smoking Status Yearly  Screened one or more times in 24 months  Cessation Counseling or  Active cessation medication Past 24 months  Past 24 months   Guideline developer: UpToDate (See UpToDate for funding source) Date Released: 2014       Wellness Office Visit  Subjective:  Patient ID: Melanie Spencer, female    DOB: 1960-09-12  Age: 60 y.o. MRN: [id number]  CC: Probable UTI. HPI  This lady comes in with a week history of dysuria with increased frequency of micturition and some nausea.  There is no abdominal pain or vomiting.  She does have a previous history of UTIs. Overall, however she says that her diabetes is better and she has lost weight as she is trying to eat healthier. Past Medical History:  Diagnosis Date   Cerebrovascular disease 05/23/2019   Essential hypertension, benign 05/23/2019   History of stroke 2007   left leg weakness   HLD (hyperlipidemia) 05/23/2019   Morbid obesity (HCC) 05/23/2019   Type II diabetes mellitus, uncontrolled (HCC) 05/23/2019   Vitamin D deficiency    Past Surgical History:  Procedure Laterality Date   AMPUTATION Right 11/09/2019   Procedure: 5TH AMPUTATION RAY;  Surgeon: Vivi Barrack, DPM;  [facility];  Service: Podiatry;  Laterality: Right;   CHOLECYSTECTOMY     COLONOSCOPY N/A 11/12/2020   Procedure: COLONOSCOPY;  Surgeon: Corbin Ade, MD;  [facility];  Service: Endoscopy;  Laterality: N/A;  ASA III / PM procedure   POLYPECTOMY  11/12/2020   Procedure: POLYPECTOMY;  Surgeon: Corbin Ade, MD;  [facility];  Service: Endoscopy;;     Family History  Problem Relation Age of Onset   Heart disease Mother    Diabetes Mother    Hypertension Mother    Cancer Father        Lung   Hypertension Sister    Macular degeneration Sister    Appendicitis Brother    Hypertension Sister    Diabetes Sister     Social History   Social History Narrative    Married for 31 yrs.On disability secondary to CVA.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   Social History   Tobacco Use   Smoking status: Former    Packs/day: 2.00    Years: 20.00    Pack years: 40.00    Types: Cigarettes    Quit date: 07/26/2005    Years since quitting: 15.4   Smokeless tobacco: Never  Substance Use Topics   Alcohol use: No    Alcohol/week: 0.0 standard drinks    Current Meds  Medication Sig   aspirin 325 MG tablet Take 325 mg by mouth daily.   cholecalciferol (VITAMIN D) 1000 UNITS tablet Take 1,000 Units by mouth daily.    ciprofloxacin (CIPRO) 500 MG tablet Take 1 tablet (500 mg total) by mouth 2 (two) times daily for 5 days.   Dulaglutide (TRULICITY) 4.5 MG/0.5ML SOPN Inject 4.5 mg as directed once a week.   gabapentin (NEURONTIN) 300 MG capsule TAKE 2 CAPSULES BY MOUTH THREE TIMES DAILY   glucose blood (ACCU-CHEK GUIDE) test strip TEST FOUR TIMES A DAY   Insulin Pen Needle 29G X 12.7MM MISC 1 Units by Does not apply route daily at 12 noon.   metFORMIN (GLUCOPHAGE) 1000 MG tablet TAKE 1 TABLET BY MOUTH TWICE DAILY   omeprazole (PRILOSEC) 20 MG capsule TAKE 1 CAPSULE BY MOUTH DAILY   oxybutynin (DITROPAN) 5 MG tablet TAKE  1 TABLET(5 MG) BY MOUTH THREE TIMES DAILY   ramipril (ALTACE) 10 MG capsule TAKE 2 CAPSULES(20 MG) BY MOUTH DAILY   simvastatin (ZOCOR) 40 MG tablet TAKE 1 TABLET(40 MG) BY MOUTH AT BEDTIME     Flowsheet Row Office Visit from 10/30/2020 in [facility]  PHQ-9 Total Score 0       Objective:   Today's Vitals: BP 133/77 (BP Location: Left Arm, Patient Position: Sitting, Cuff Size: Normal)   Pulse 87   Temp 97.8 F (36.6 C) (Temporal)   Resp 18   Ht 5\' 6"  (1.676 m)   Wt 232 lb 9.6 oz (105.5 kg) Comment: with una boot.  LMP 03/02/2012   SpO2 99%   BMI 37.54 kg/m  Vitals with BMI 01/20/2021 11/12/2020 11/12/2020  Height 5\' 6"  - -  Weight 232 lbs 10 oz - -  BMI 37.56 - -  Systolic 133 168 11/14/2020  Diastolic 77 61 75  Pulse 87  68 76     Physical Exam  She looks systemically well.  She is afebrile.  She is alert and orientated without any focal logical signs     Assessment   1. Acute cystitis without hematuria       Tests ordered Orders Placed This Encounter  Procedures   Urinalysis w microscopic + reflex cultur     Plan: 1.  I will get her to give a urine sample sent to the lab and we will treat her empirically in the meantime with ciprofloxacin which she has tolerated in the past.  Further recommendations will depend on the urine culture. 2.  Follow-up with 149 in the middle of July    Meds ordered this encounter  Medications   ciprofloxacin (CIPRO) 500 MG tablet    Sig: Take 1 tablet (500 mg total) by mouth 2 (two) times daily for 5 days.    Dispense:  10 tablet    Refill:  0     Khylah Kendra Maralyn Sago, MD

## 2021-01-22 ENCOUNTER — Telehealth (INDEPENDENT_AMBULATORY_CARE_PROVIDER_SITE_OTHER): Payer: Self-pay

## 2021-01-22 ENCOUNTER — Other Ambulatory Visit (INDEPENDENT_AMBULATORY_CARE_PROVIDER_SITE_OTHER): Payer: Self-pay | Admitting: Nurse Practitioner

## 2021-01-22 DIAGNOSIS — N3000 Acute cystitis without hematuria: Secondary | ICD-10-CM

## 2021-01-22 MED ORDER — SULFAMETHOXAZOLE-TRIMETHOPRIM 800-160 MG PO TABS: 1.0000 | ORAL_TABLET | Freq: Two times a day (BID) | ORAL | 0 refills | Status: DC

## 2021-01-22 NOTE — Telephone Encounter (Signed)
Patients husband called and left a voice message that patient is not feeling better on antibiotics and discussed with Maralyn Sago and she is sending in Bactrim for patient. Called patients husband and left a detailed voice message to let him know a new antibiotic has been sent in to her pharmacy.

## 2021-01-22 NOTE — Progress Notes (Signed)
Will change from ciprofloxacin to bactrim for treatment of her UTI.  Patient's husband had called and left a message with the nurse stating that patient's urinary tract infection symptoms are worsening despite patient being on ciprofloxacin.  We are still waiting for urine culture results.  Please call her back and tell her to stop the ciprofloxacin and I will instead send a medication called Bactrim to her pharmacy ([facility] [address]  She does take 1 tab by mouth twice a day for 5 days.  If she has any significant side effects including a rash she needs to stop the medication and call this office.

## 2021-01-24 LAB — URINE CULTURE

## 2021-01-24 LAB — URINALYSIS W MICROSCOPIC + REFLEX CULTURE
Bilirubin Urine: NEGATIVE
Glucose, UA: NEGATIVE
Hgb urine dipstick: NEGATIVE
Ketones, ur: NEGATIVE
Nitrites, Initial: POSITIVE — AB
RBC / HPF: NONE SEEN /HPF (ref 0–2)
Specific Gravity, Urine: 1.011 (ref 1.001–1.035)
Squamous Epithelial / HPF: NONE SEEN /HPF (ref <=5)
WBC, UA: >=60 /HPF — AB (ref 0–5)
pH: <=5 (ref 5.0–8.0)

## 2021-01-24 LAB — CULTURE INDICATED

## 2021-01-27 NOTE — Progress Notes (Signed)
Called patient last week to advise of the new medication.  I called the patient today and she stated that she is feeling much better and thanked [place] for changing the medication.

## 2021-02-04 ENCOUNTER — Ambulatory Visit (INDEPENDENT_AMBULATORY_CARE_PROVIDER_SITE_OTHER): Payer: Medicare PPO | Admitting: Nurse Practitioner

## 2021-02-12 ENCOUNTER — Ambulatory Visit: Payer: Medicare PPO | Admitting: Podiatry

## 2021-02-14 ENCOUNTER — Other Ambulatory Visit (INDEPENDENT_AMBULATORY_CARE_PROVIDER_SITE_OTHER): Payer: Self-pay | Admitting: Nurse Practitioner

## 2021-02-14 DIAGNOSIS — N3941 Urge incontinence: Secondary | ICD-10-CM

## 2021-02-17 ENCOUNTER — Other Ambulatory Visit (INDEPENDENT_AMBULATORY_CARE_PROVIDER_SITE_OTHER): Payer: Self-pay

## 2021-02-17 DIAGNOSIS — N3941 Urge incontinence: Secondary | ICD-10-CM

## 2021-02-17 DIAGNOSIS — N3000 Acute cystitis without hematuria: Secondary | ICD-10-CM

## 2021-02-18 ENCOUNTER — Telehealth (INDEPENDENT_AMBULATORY_CARE_PROVIDER_SITE_OTHER): Payer: Self-pay

## 2021-02-18 ENCOUNTER — Other Ambulatory Visit (INDEPENDENT_AMBULATORY_CARE_PROVIDER_SITE_OTHER): Payer: Self-pay | Admitting: Nurse Practitioner

## 2021-02-18 DIAGNOSIS — N3941 Urge incontinence: Secondary | ICD-10-CM

## 2021-02-18 DIAGNOSIS — R32 Unspecified urinary incontinence: Secondary | ICD-10-CM

## 2021-02-18 MED ORDER — FESOTERODINE FUMARATE ER 4 MG PO TB24: 4.0000 mg | ORAL_TABLET | Freq: Every day | ORAL | 2 refills | Status: DC

## 2021-02-18 NOTE — Telephone Encounter (Signed)
Patient is out of Oxybutynin and is asking what she needs to take? She stated that the pharmacy will not fill until mid to late August. Please advise.

## 2021-02-18 NOTE — Telephone Encounter (Signed)
Called patient and gave her the message from Maralyn Sago. Patient verbalized an understanding and stated to please send the prescription to [facility] on [address]

## 2021-02-18 NOTE — Telephone Encounter (Signed)
Patient and her husband called asking if she can get a refill of the following medication because she is taking 2 of these 3 times a day and wanting to know if you can increase and send a refill to the pharmacy? Patient stated that she needs it more often than advised to take. Oxybutynin 5 mg Please advise.

## 2021-02-18 NOTE — Telephone Encounter (Signed)
Called patient and gave her the message. Patient is asking what are the side effects if she has taken too much medication?

## 2021-02-18 NOTE — Telephone Encounter (Signed)
Prescription sent

## 2021-02-18 NOTE — Telephone Encounter (Signed)
She can try fesoterodine, it is a 4 mg tablet that she can take by mouth once a day.  Common side effects include diarrhea, dry eye, peripheral swelling, rash, weight gain, constipation, nausea, urinary retention, blurry vision, dizziness, drowsiness.  Rare but severe side effects include angioedema so if she starts to experience any swelling of the lips, tongue, or throat she needs to stop the medication right away and go to the emergency department.  She may also be at increased risk for heatstroke so she needs to be mindful of staying out of the heat during the day and drinking plenty of water.  If she starts to experience any eye pain or worsening blurry vision she should also stop the medicine and notify us.  This should not be taken by someone with untreated glaucoma.  Please let me know if she would like to try this medication, if so I will send a prescription.  Please verify which pharmacy she would like the prescription to go to.  Thank you.

## 2021-02-18 NOTE — Telephone Encounter (Signed)
Patient would like a referral ASAP please. Thank you!

## 2021-02-18 NOTE — Addendum Note (Signed)
Addended by: Elenore Paddy on: 02/18/2021 05:41 PM   Modules accepted: Orders

## 2021-02-19 ENCOUNTER — Ambulatory Visit: Payer: Medicare PPO | Admitting: Podiatry

## 2021-02-19 ENCOUNTER — Other Ambulatory Visit: Payer: Self-pay

## 2021-02-19 ENCOUNTER — Ambulatory Visit (INDEPENDENT_AMBULATORY_CARE_PROVIDER_SITE_OTHER): Payer: Medicare PPO | Admitting: Nurse Practitioner

## 2021-02-19 ENCOUNTER — Ambulatory Visit (INDEPENDENT_AMBULATORY_CARE_PROVIDER_SITE_OTHER): Payer: Medicare PPO

## 2021-02-19 DIAGNOSIS — M79674 Pain in right toe(s): Secondary | ICD-10-CM

## 2021-02-19 DIAGNOSIS — Z899 Acquired absence of limb, unspecified: Secondary | ICD-10-CM | POA: Diagnosis not present

## 2021-02-19 DIAGNOSIS — E1149 Type 2 diabetes mellitus with other diabetic neurological complication: Secondary | ICD-10-CM | POA: Diagnosis not present

## 2021-02-19 DIAGNOSIS — M79675 Pain in left toe(s): Secondary | ICD-10-CM

## 2021-02-19 DIAGNOSIS — B351 Tinea unguium: Secondary | ICD-10-CM

## 2021-02-19 DIAGNOSIS — S82832D Other fracture of upper and lower end of left fibula, subsequent encounter for closed fracture with routine healing: Secondary | ICD-10-CM

## 2021-02-23 NOTE — Progress Notes (Signed)
Subjective: 60 year old female presents the office today for evaluation of left avulsion of fibular fracture.  She is to wear the [place] brace which does help.  Gets occasional swelling.  The pain does vary but not every day but overall doing much better.  No open sores that she reports.  She has no other concerns today.  Denies any fevers or chills.  Objective: AAO x3, NAD DP/PT pulses palpable bilaterally, CRT less than 3 seconds There is no significant tenderness palpation of the distal aspect of the fibula on the left side.  There is no proximal tib-fib pain.  No pain to the fifth metatarsal base, metatarsals, talus or other areas of the foot.  Minimal edema.  There is no erythema or warmth. Nails are very hypertrophic, dystrophic with yellow discoloration x9.  Nails affected are 1-5 on the left and 1 through 4 on the right.  No edema, erythema or signs of infection. No pain with calf compression, swelling, warmth, erythema  Assessment: Avulsion fracture left fibula; symptomatic onychomycosis  Plan: -All treatment options discussed with the patient including all alternatives, risks, complications.  -Repeat x-rays obtained and reviewed.  Complications none of the distal lateral portion of the fibula likely from chronic avulsion.  At this point she is doing well and there is no ankle instability present.  Recommend continue with the [place] brace for stability. -Sharply debrided nails x9 without any complications or bleeding -Daily foot inspection -Patient encouraged to call the office with any questions, concerns, change in symptoms.   Return in about 2 months (around 04/21/2021).  Vivi Barrack DPM

## 2021-03-09 ENCOUNTER — Other Ambulatory Visit (INDEPENDENT_AMBULATORY_CARE_PROVIDER_SITE_OTHER): Payer: Self-pay

## 2021-03-10 ENCOUNTER — Other Ambulatory Visit (INDEPENDENT_AMBULATORY_CARE_PROVIDER_SITE_OTHER): Payer: Self-pay | Admitting: Nurse Practitioner

## 2021-03-11 MED ORDER — TRULICITY 4.5 MG/0.5ML ~~LOC~~ SOAJ
4.5000 mg | SUBCUTANEOUS | 3 refills | Status: DC
Start: 2021-03-11 — End: 2023-11-30

## 2021-03-12 ENCOUNTER — Encounter (HOSPITAL_COMMUNITY): Payer: Self-pay | Admitting: *Deleted

## 2021-03-12 ENCOUNTER — Other Ambulatory Visit: Payer: Self-pay

## 2021-03-12 ENCOUNTER — Other Ambulatory Visit (INDEPENDENT_AMBULATORY_CARE_PROVIDER_SITE_OTHER): Payer: Self-pay | Admitting: Nurse Practitioner

## 2021-03-12 ENCOUNTER — Emergency Department (HOSPITAL_COMMUNITY)
Admission: EM | Admit: 2021-03-12 | Discharge: 2021-03-12 | Disposition: A | Discharge status: Home / Self Care | Payer: Medicare PPO | Attending: Emergency Medicine | Admitting: Emergency Medicine

## 2021-03-12 ENCOUNTER — Emergency Department (HOSPITAL_COMMUNITY): Payer: Medicare PPO

## 2021-03-12 DIAGNOSIS — S42292A Other displaced fracture of upper end of left humerus, initial encounter for closed fracture: Secondary | ICD-10-CM | POA: Diagnosis not present

## 2021-03-12 DIAGNOSIS — I10 Essential (primary) hypertension: Secondary | ICD-10-CM | POA: Diagnosis not present

## 2021-03-12 DIAGNOSIS — Z794 Long term (current) use of insulin: Secondary | ICD-10-CM | POA: Diagnosis not present

## 2021-03-12 DIAGNOSIS — S4992XA Unspecified injury of left shoulder and upper arm, initial encounter: Secondary | ICD-10-CM | POA: Diagnosis present

## 2021-03-12 DIAGNOSIS — I2510 Atherosclerotic heart disease of native coronary artery without angina pectoris: Secondary | ICD-10-CM | POA: Diagnosis not present

## 2021-03-12 DIAGNOSIS — W0110XA Fall on same level from slipping, tripping and stumbling with subsequent striking against unspecified object, initial encounter: Secondary | ICD-10-CM | POA: Insufficient documentation

## 2021-03-12 DIAGNOSIS — E119 Type 2 diabetes mellitus without complications: Secondary | ICD-10-CM | POA: Diagnosis not present

## 2021-03-12 DIAGNOSIS — Z87891 Personal history of nicotine dependence: Secondary | ICD-10-CM | POA: Insufficient documentation

## 2021-03-12 MED ORDER — HYDROCODONE-ACETAMINOPHEN 5-325 MG PO TABS
1.0000 | ORAL_TABLET | Freq: Once | ORAL | Status: AC
Start: 2021-03-12 — End: 2021-03-12
  Administered 2021-03-12: 1 via ORAL
  Filled 2021-03-12: qty 1

## 2021-03-12 MED ORDER — HYDROCODONE-ACETAMINOPHEN 5-325 MG PO TABS: 1.0000 | ORAL_TABLET | Freq: Four times a day (QID) | ORAL | 0 refills | Status: DC | PRN

## 2021-03-12 NOTE — ED Triage Notes (Signed)
Fell 2 days ago. Pain in left upper arm

## 2021-03-12 NOTE — ED Provider Notes (Signed)
[facility] EMERGENCY DEPARTMENT Provider Note   CSN: [id number] Arrival date & time: 03/12/21  1702     History Chief Complaint  Patient presents with   Melanie Spencer is a 60 y.o. female.  The history is provided by the patient and the spouse.  Fall This is a new problem. The current episode started 2 days ago (complaint of persistent pain in her left upper arm/shoulder since tripping and landing on her left side.). The problem occurs constantly. The problem has not changed since onset.Pertinent negatives include no chest pain, no headaches and no shortness of breath. Exacerbated by: movement. The symptoms are relieved by rest. She has tried acetaminophen for the symptoms. The treatment provided no relief.    Patient's history is significant for CVA resulting in permanent weakness in her left arm and leg.  She is ambulatory but has significant chronic weakness in the left arm and hand, her fingers are held in flexion.  She has normal sensation in this extremity.    Past Medical History:  Diagnosis Date   Cerebrovascular disease 05/23/2019   Essential hypertension, benign 05/23/2019   History of stroke 2007   left leg weakness   HLD (hyperlipidemia) 05/23/2019   Morbid obesity (HCC) 05/23/2019   Type II diabetes mellitus, uncontrolled (HCC) 05/23/2019   Vitamin D deficiency     Patient Active Problem List   Diagnosis Date Noted   Mixed hyperlipidemia due to type 2 diabetes mellitus (HCC) 11/09/2019   Uncontrolled type 2 diabetes mellitus with diabetic polyneuropathy, with long-term current use of insulin (HCC) 11/09/2019   Acute osteomyelitis of right foot (HCC) 11/09/2019   Osteomyelitis (HCC) 11/09/2019   Urinary incontinence 08/29/2019   Essential hypertension, benign 05/23/2019   Morbid obesity (HCC) 05/23/2019   Cerebrovascular disease 05/23/2019   Stroke [facility])     Past Surgical History:  Procedure Laterality Date   AMPUTATION Right 11/09/2019    Procedure: 5TH AMPUTATION RAY;  Surgeon: Vivi Barrack, DPM;  Location: [facility] OR;  Service: Podiatry;  Laterality: Right;   CHOLECYSTECTOMY     COLONOSCOPY N/A 11/12/2020   Procedure: COLONOSCOPY;  Surgeon: Corbin Ade, MD;  [facility];  Service: Endoscopy;  Laterality: N/A;  ASA III / PM procedure   POLYPECTOMY  11/12/2020   Procedure: POLYPECTOMY;  Surgeon: Corbin Ade, MD;  [facility];  Service: Endoscopy;;     OB History     Gravida  0   Para  0   Term  0   Preterm  0   AB  0   Living  0      SAB  0   IAB  0   Ectopic  0   Multiple  0   Live Births  0           Family History  Problem Relation Age of Onset   Heart disease Mother    Diabetes Mother    Hypertension Mother    Cancer Father        Lung   Hypertension Sister    Macular degeneration Sister    Appendicitis Brother    Hypertension Sister    Diabetes Sister     Social History   Tobacco Use   Smoking status: Former    Packs/day: 2.00    Years: 20.00    Pack years: 40.00    Types: Cigarettes    Quit date: 07/26/2005    Years since quitting:  15.6   Smokeless tobacco: Never  Vaping Use   Vaping Use: Never used  Substance Use Topics   Alcohol use: No    Alcohol/week: 0.0 standard drinks   Drug use: No    Home Medications Prior to Admission medications   Medication Sig Start Date End Date Taking? Authorizing Provider  HYDROcodone-acetaminophen (NORCO/VICODIN) 5-325 MG tablet Take 1 tablet by mouth every 6 (six) hours as needed. 03/12/21  Yes Amaziah Raisanen, Raynelle Fanning, PA-C  HYDROcodone-acetaminophen (NORCO/VICODIN) 5-325 MG tablet Take 1 tablet by mouth every 6 (six) hours as needed. 03/12/21  Yes Keshav Winegar, Raynelle Fanning, PA-C  aspirin 325 MG tablet Take 325 mg by mouth daily.    [provider]  cholecalciferol (VITAMIN D) 1000 UNITS tablet Take 1,000 Units by mouth daily.     [provider]  Dulaglutide (TRULICITY) 4.5 MG/0.5ML SOPN Inject 4.5 mg as directed  once a week. 03/11/21   Elenore Paddy, NP  fesoterodine (TOVIAZ) 4 MG TB24 tablet Take 1 tablet (4 mg total) by mouth daily. 02/18/21   Elenore Paddy, NP  fluticasone Aleda Grana) 50 MCG/ACT nasal spray SHAKE LIQUID AND USE 2 SPRAYS IN [facility] NOSTRIL EVERY DAY 10/13/20 11/12/20  Elenore Paddy, NP  gabapentin (NEURONTIN) 300 MG capsule TAKE 2 CAPSULES BY MOUTH THREE TIMES DAILY 11/10/20 01/20/21  Elenore Paddy, NP  glucose blood (ACCU-CHEK GUIDE) test strip TEST FOUR TIMES A DAY 12/27/20   Lilly Cove C, MD  Insulin Pen Needle 29G X 12.7MM MISC 1 Units by Does not apply route daily at 12 noon. 12/02/20   [name], MD  LANTUS SOLOSTAR 100 UNIT/ML Solostar Pen ADMINISTER 40 UNITS UNDER THE SKIN AT BEDTIME Patient not taking: Reported on 01/20/2021 01/19/21   [name], MD  metFORMIN (GLUCOPHAGE) 1000 MG tablet TAKE 1 TABLET BY MOUTH TWICE DAILY 12/10/20 01/20/21  [name], MD  omeprazole (PRILOSEC) 20 MG capsule TAKE 1 CAPSULE BY MOUTH DAILY 03/10/21   Elenore Paddy, NP  ramipril (ALTACE) 10 MG capsule TAKE 2 CAPSULES(20 MG) BY MOUTH DAILY 01/03/21   Lilly Cove C, MD  simvastatin (ZOCOR) 40 MG tablet TAKE 1 TABLET(40 MG) BY MOUTH AT BEDTIME 03/12/21   Elenore Paddy, NP  sulfamethoxazole-trimethoprim (BACTRIM DS) 800-160 MG tablet Take 1 tablet by mouth 2 (two) times daily. 01/22/21   Elenore Paddy, NP    Allergies    Patient has no known allergies.  Review of Systems   Review of Systems  Constitutional:  Negative for fever.  Respiratory:  Negative for shortness of breath.   Cardiovascular:  Negative for chest pain.  Musculoskeletal:  Positive for arthralgias and joint swelling. Negative for myalgias.  Neurological:  Negative for weakness, numbness and headaches.   Physical Exam Updated Vital Signs BP (!) 154/69   Pulse 84   Temp 98.2 F (36.8 C)   Resp 18   LMP 03/02/2012   SpO2 99%   Physical Exam Constitutional:      Appearance: She is well-developed.  HENT:      Head: Atraumatic.  Cardiovascular:     Comments: Pulses equal bilaterally Musculoskeletal:        General: Swelling and tenderness present.     Cervical back: Normal range of motion.     Comments: Patient is tender to palpation at her left lateral upper humerus/humeral head region.  She has no tenderness to palpation in her distal humerus elbow forearm wrist or hand.  Her distal sensation is intact in  this extremity.  She holds her fingers in flexion.  Less than 2-second cap refill in the digits.  Radial pulses intact.  Skin:    General: Skin is warm and dry.  Neurological:     Mental Status: She is alert.     Sensory: No sensory deficit.     Motor: No weakness.     Deep Tendon Reflexes: Reflexes normal.    ED Results / Procedures / Treatments   Labs (all labs ordered are listed, but only abnormal results are displayed) Labs Reviewed - No data to display  EKG None  Radiology DG Shoulder Left  Result Date: 03/12/2021 CLINICAL DATA:  Fall and trauma to the left shoulder. EXAM: LEFT SHOULDER - 2+ VIEW COMPARISON:  None. FINDINGS: There is a displaced comminuted appearing fracture of the left humeral head and neck. Evaluation for fracture is limited due to advanced osteopenia. There may be slight anterior subluxation of the humeral head in relation to the glenoid. Evaluation for dislocation is limited on the provided views. Several ovoid corticated structures within the joint space likely represent loose bodies. The soft tissues are unremarkable. IMPRESSION: 1. Displaced comminuted appearing fracture of the left humeral head and neck. 2. Possible partial anterior subluxation of the left shoulder. 3. Advanced osteopenia. Electronically Signed   By: Elgie Collard M.D.   On: 03/12/2021 22:11    Procedures Procedures   Medications Ordered in ED Medications  HYDROcodone-acetaminophen (NORCO/VICODIN) 5-325 MG per tablet 1 tablet (1 tablet Oral Given 03/12/21 2124)    ED Course  I have  reviewed the triage vital signs and the nursing notes.  Pertinent labs & imaging results that were available during my care of the patient were reviewed by me and considered in my medical decision making (see chart for details).    MDM Rules/Calculators/A&P                           Imaging reviewed and discussed with patient and husband at bedside.  She was placed in a sling and swath which did provide her with some comfort, also she was started on hydrocodone for pain relief.  Caution regarding sedation given.  She was referred to Dr. Dallas Schimke for follow-up care of this injury.  Patient also endorses she was a patient of Dr. Karilyn Cota and is in need of obtaining a new PCP, she was given some local referrals for this as well. Final Clinical Impression(s) / ED Diagnoses Final diagnoses:  Closed fracture of head of left humerus, initial encounter    Rx / DC Orders ED Discharge Orders          Ordered    HYDROcodone-acetaminophen (NORCO/VICODIN) 5-325 MG tablet  Every 6 hours PRN        03/12/21 2244    HYDROcodone-acetaminophen (NORCO/VICODIN) 5-325 MG tablet  Every 6 hours PRN        03/12/21 2246             Burgess Amor, PA-C 03/12/21 2354    Terald Sleeper, MD 03/13/21 [phone]

## 2021-03-12 NOTE — Discharge Instructions (Addendum)
Take the pain medication as prescribed if needed, this medication will make you drowsy, use caution while taking this medication, do not drive within 4 hours of taking these tablets.  Wear the sling at all times to protect your injury.  Call Dr. Dallas Schimke for an office visit for further evaluation and management of this injury.

## 2021-03-13 MED FILL — Hydrocodone-Acetaminophen Tab 5-325 MG: ORAL | Qty: 6 | Status: AC

## 2021-03-16 ENCOUNTER — Telehealth (INDEPENDENT_AMBULATORY_CARE_PROVIDER_SITE_OTHER): Payer: Self-pay

## 2021-03-16 NOTE — Telephone Encounter (Signed)
Patient called and left a detailed voice message that she fell and hurt her Left arm. I see in her chart that she has been seen in ED. I called the patient and gave her the number to Dr. Dallas Schimke for her to call and get scheduled ASAP for a follow up as patient stated that her arm is very uncomfortable. I also gave patient the contact number for [facility] to call and see if they are accepting new patients. Patient verbalized an understanding and thanked [place].

## 2021-03-17 ENCOUNTER — Encounter: Payer: Self-pay | Admitting: Orthopedic Surgery

## 2021-03-17 ENCOUNTER — Other Ambulatory Visit: Payer: Self-pay

## 2021-03-17 ENCOUNTER — Ambulatory Visit: Payer: Medicare PPO | Admitting: Orthopedic Surgery

## 2021-03-17 VITALS — BP 167/88 | HR 88 | Ht 66.0 in | Wt 232.0 lb

## 2021-03-17 DIAGNOSIS — S42202A Unspecified fracture of upper end of left humerus, initial encounter for closed fracture: Secondary | ICD-10-CM

## 2021-03-17 NOTE — Progress Notes (Signed)
New Patient Visit  Assessment: Melanie Spencer is a 60 y.o. RHD female with the following: Left proximal humerus fracture; in setting of previous stroke and left-sided deficits  Plan: Patient sustained a fall approximately 1 week ago, at which time she sustained a proximal left humerus fracture.  Minimal displacement of the fracture through the surgical neck, as well as the greater tuberosity.  There is also some HO formation, likely related to history of stroke, and limited function of her left arm.  Regardless, the goal will be to manage her pain control, and allow the bone to heal.  As the bone heals, her pain will improve.  Because she has limited use of the left arm, she will be less concerned with her regaining her motion.  This was discussed with the patient.  She is in agreement with the plan.  We will follow her closely, with serial radiographs to ensure that she has continued healing.  She should remain in the sling at all times until she is seen again in clinic in approximately 3 weeks.  Her current sling was adjusted in clinic today.  It is now well fitting.  We will see her in 3 weeks, and they will contact the clinic they have any issues.   Follow-up: Return in about 3 weeks (around 04/07/2021).  Subjective:  Chief Complaint  Patient presents with   Shoulder Injury    Lt shoulder injury DOI 03/10/21    History of Present Illness: Melanie Spencer is a 60 y.o. female who presents for evaluation of left shoulder pain.  She states that she was walking in her home, approximate 1 week ago, when she stumbled and fell.  She had immediate pain in the left shoulder.  Unfortunately, she is unable to get up for several hours.  She presents to the emergency department, was noted to have a left proximal humerus fracture.  She does have a history of stroke, with limited use of the left upper extremity.  Nonetheless, she continues to have pain in the left shoulder following this injury.  She has been  using a sling, but it is difficult to maintain the position, she has little control over position of her left arm.  Sensation in the left upper extremity has been unchanged since the fall.  They have noticed a lot of swelling and bruising of the upper arm.   Review of Systems: No fevers or chills No numbness or tingling No chest pain No shortness of breath No bowel or bladder dysfunction No GI distress No headaches   Medical History:  Past Medical History:  Diagnosis Date   Cerebrovascular disease 05/23/2019   Essential hypertension, benign 05/23/2019   History of stroke 2007   left leg weakness   HLD (hyperlipidemia) 05/23/2019   Morbid obesity (HCC) 05/23/2019   Type II diabetes mellitus, uncontrolled (HCC) 05/23/2019   Vitamin D deficiency     Past Surgical History:  Procedure Laterality Date   AMPUTATION Right 11/09/2019   Procedure: 5TH AMPUTATION RAY;  Surgeon: Vivi Barrack, DPM;  Location: [facility];  Service: Podiatry;  Laterality: Right;   CHOLECYSTECTOMY     COLONOSCOPY N/A 11/12/2020   Procedure: COLONOSCOPY;  Surgeon: Corbin Ade, MD;  Location: [facility];  Service: Endoscopy;  Laterality: N/A;  ASA III / PM procedure   POLYPECTOMY  11/12/2020   Procedure: POLYPECTOMY;  Surgeon: Corbin Ade, MD;  Location: [facility];  Service: Endoscopy;;    Family History  Problem Relation Age of Onset   Heart disease Mother    Diabetes Mother    Hypertension Mother    Cancer Father        Lung   Hypertension Sister    Macular degeneration Sister    Appendicitis Brother    Hypertension Sister    Diabetes Sister    Social History   Tobacco Use   Smoking status: Former    Packs/day: 2.00    Years: 20.00    Pack years: 40.00    Types: Cigarettes    Quit date: 07/26/2005    Years since quitting: 15.6   Smokeless tobacco: Never  Vaping Use   Vaping Use: Never used  Substance Use Topics   Alcohol use: No    Alcohol/week: 0.0 standard drinks   Drug  use: No    No Known Allergies  No outpatient medications have been marked as taking for the 03/17/21 encounter (Office Visit) with Oliver Barre, MD.    Objective: BP (!) 167/88   Pulse 88   Ht 5\' 6"  (1.676 m)   Wt 232 lb (105.2 kg)   LMP 03/02/2012   BMI 37.45 kg/m   Physical Exam:  General: Elderly female., Alert and oriented., and No acute distress.  Evaluation of left shoulder demonstrates obvious swelling and ecchymosis.  The ecchymosis continues distally towards the elbow.  Tenderness to palpation about the shoulder.  The sensation in the axillary patch, as well as her left hand is at her baseline.  Limited range of motion of the fingers, which is also her baseline.  No contractures of the fingers.  Fingers are warm and well-perfused.  2+ radial pulse.    IMAGING: I personally reviewed images previously obtained from the ED  X-ray of the left shoulder demonstrates a proximal humerus fracture, primarily through the surgical neck, with a mildly displaced greater tuberosity fragment.  There is also some atrial formation throughout the shoulder.  New Medications:  No orders of the defined types were placed in this encounter.     05/02/2012, MD  03/17/2021 3:27 PM

## 2021-03-19 ENCOUNTER — Telehealth (INDEPENDENT_AMBULATORY_CARE_PROVIDER_SITE_OTHER): Payer: Self-pay

## 2021-03-19 ENCOUNTER — Other Ambulatory Visit (INDEPENDENT_AMBULATORY_CARE_PROVIDER_SITE_OTHER): Payer: Self-pay | Admitting: Nurse Practitioner

## 2021-03-19 DIAGNOSIS — N3000 Acute cystitis without hematuria: Secondary | ICD-10-CM

## 2021-03-19 DIAGNOSIS — E1165 Type 2 diabetes mellitus with hyperglycemia: Secondary | ICD-10-CM

## 2021-03-19 MED ORDER — METFORMIN HCL 1000 MG PO TABS: 1000.0000 mg | ORAL_TABLET | Freq: Two times a day (BID) | ORAL | 2 refills | Status: DC

## 2021-03-19 NOTE — Telephone Encounter (Signed)
Received a fax from [facility] on [address] for a refill request of the following medication:  metFORMIN (GLUCOPHAGE) 1000 MG tablet Last filled 12/10/2020, #60 with 2 refills

## 2021-03-20 ENCOUNTER — Other Ambulatory Visit: Payer: Self-pay | Admitting: Orthopedic Surgery

## 2021-03-20 MED ORDER — HYDROCODONE-ACETAMINOPHEN 5-325 MG PO TABS: 1.0000 | ORAL_TABLET | Freq: Four times a day (QID) | ORAL | 0 refills | Status: DC | PRN

## 2021-03-20 NOTE — Telephone Encounter (Signed)
Patient called for refill (reminded of policy to contact office before noon on Thursdays): HYDROcodone-acetaminophen (NORCO/VICODIN) 5-325 MG tablet 15 tablet    [facility], [address], [place]

## 2021-03-21 ENCOUNTER — Other Ambulatory Visit (INDEPENDENT_AMBULATORY_CARE_PROVIDER_SITE_OTHER): Payer: Self-pay | Admitting: Nurse Practitioner

## 2021-03-21 DIAGNOSIS — N3941 Urge incontinence: Secondary | ICD-10-CM

## 2021-03-26 ENCOUNTER — Ambulatory Visit: Payer: Medicare PPO | Admitting: Urology

## 2021-03-31 ENCOUNTER — Telehealth: Payer: Self-pay | Admitting: Orthopedic Surgery

## 2021-03-31 MED ORDER — HYDROCODONE-ACETAMINOPHEN 5-325 MG PO TABS: 1.0000 | ORAL_TABLET | Freq: Four times a day (QID) | ORAL | 0 refills | Status: DC | PRN

## 2021-03-31 NOTE — Telephone Encounter (Signed)
Patient requests presecription for Hydrocodone/Acetaminophen 5-325 mgs.  Sig: Take 1 tablet by mouth every 6 (six) hours as needed.  [facility] uses [facility] on [address]

## 2021-04-06 ENCOUNTER — Ambulatory Visit (INDEPENDENT_AMBULATORY_CARE_PROVIDER_SITE_OTHER): Payer: Medicare PPO | Admitting: Orthopedic Surgery

## 2021-04-06 ENCOUNTER — Other Ambulatory Visit: Payer: Self-pay

## 2021-04-06 ENCOUNTER — Encounter: Payer: Self-pay | Admitting: Orthopedic Surgery

## 2021-04-06 ENCOUNTER — Ambulatory Visit: Payer: Medicare PPO

## 2021-04-06 VITALS — Ht 66.5 in | Wt 244.2 lb

## 2021-04-06 DIAGNOSIS — S42202A Unspecified fracture of upper end of left humerus, initial encounter for closed fracture: Secondary | ICD-10-CM

## 2021-04-06 MED ORDER — HYDROCODONE-ACETAMINOPHEN 5-325 MG PO TABS: 1.0000 | ORAL_TABLET | Freq: Four times a day (QID) | ORAL | 0 refills | Status: DC | PRN

## 2021-04-06 NOTE — Progress Notes (Signed)
Orthopaedic Clinic Return  Assessment: Melanie Spencer is a 60 y.o. female with the following: Left proximal humerus fracture, in setting of previous stroke and limited function  Plan: Patient's pain is improving.  X-rays demonstrates minimal further displacement of the proximal humerus fracture.  She does have some pseudosubluxation, likely secondary to disuse.  She continues to use the sling.  I have advised her to use the sling for the next 2 weeks, then gradually wean out of the sling, start to use the left shoulder more.  She should be wearing the sling while ambulatory, but can take it off when she is at home.  I provided her with another prescription for pain medication, but advised her I will continue to work to get her off of the narcotic pain medications.  Meds ordered this encounter  Medications   HYDROcodone-acetaminophen (NORCO/VICODIN) 5-325 MG tablet    Sig: Take 1 tablet by mouth every 6 (six) hours as needed for moderate pain.    Dispense:  15 tablet    Refill:  0    Body mass index is 38.83 kg/m.  Follow-up: Return in about 4 weeks (around 05/04/2021).   Subjective:  Chief Complaint  Patient presents with   Shoulder Pain    L/ it still hurts some times worse than other times    History of Present Illness: Melanie Spencer is a 60 y.o. female who returns to clinic for repeat evaluation of her left shoulder.  She sustained a fall approximately 1 month ago.  She continues to improve.  She has noticed that the use of narcotic pain medications is gradually spacing out.  She has been wearing sling at all times.  She appears much more comfortable.  She states the pain is much better.  Review of Systems: No fevers or chills No numbness or tingling No chest pain No shortness of breath No bowel or bladder dysfunction No GI distress No headaches   Objective: Ht 5' 6.5" (1.689 m)   Wt 244 lb 4 oz (110.8 kg)   LMP 03/02/2012   BMI 38.83 kg/m   Physical Exam:  Alert  and oriented.  No acute distress.  Evaluation left shoulder demonstrates minimal swelling.  No ecchymosis is appreciated.  Very limited range of motion secondary to a stroke.  Passive range of motion of her fingers.  Fingers are warm and well-perfused.  Sensation is intact throughout the left upper extremity.  Minimal motion of the left arm.  IMAGING: I personally ordered and reviewed the following images:  X-rays of the left shoulder obtained in clinic today and compared to previous x-rays.  Stable appearance of a proximal humerus fracture, with chronic appearing calcium deposits.  There is pseudo subluxation of the left glenohumeral joint.  Impression: Stable left proximal humerus fracture.   Oliver Barre, MD 04/06/2021 10:56 PM

## 2021-04-08 ENCOUNTER — Encounter: Payer: Medicare PPO | Admitting: Orthopedic Surgery

## 2021-04-10 ENCOUNTER — Other Ambulatory Visit (INDEPENDENT_AMBULATORY_CARE_PROVIDER_SITE_OTHER): Payer: Self-pay | Admitting: Nurse Practitioner

## 2021-04-13 ENCOUNTER — Other Ambulatory Visit: Payer: Self-pay | Admitting: Radiology

## 2021-04-13 MED ORDER — HYDROCODONE-ACETAMINOPHEN 5-325 MG PO TABS: 1.0000 | ORAL_TABLET | Freq: Four times a day (QID) | ORAL | 0 refills | Status: DC | PRN

## 2021-04-13 NOTE — Telephone Encounter (Signed)
Patient called, asked for refill hydrocodone.

## 2021-04-23 ENCOUNTER — Telehealth: Payer: Self-pay | Admitting: Orthopedic Surgery

## 2021-04-23 MED ORDER — HYDROCODONE-ACETAMINOPHEN 5-325 MG PO TABS: 1.0000 | ORAL_TABLET | Freq: Four times a day (QID) | ORAL | 0 refills | Status: DC | PRN

## 2021-04-23 NOTE — Telephone Encounter (Signed)
Patient requests refill: HYDROcodone-acetaminophen (NORCO/VICODIN) 5-325 MG tablet 15 tablet    [facility], [address] [place]

## 2021-04-27 ENCOUNTER — Telehealth: Payer: Self-pay | Admitting: *Deleted

## 2021-04-27 ENCOUNTER — Ambulatory Visit: Payer: Medicare PPO | Admitting: Podiatry

## 2021-04-27 NOTE — Telephone Encounter (Signed)
Patient is running late, will be at office about 4:30,traffic.

## 2021-04-28 NOTE — Telephone Encounter (Signed)
Patient called on 04/27/21 but  was a no show by [facility],was rescheduled.

## 2021-04-29 ENCOUNTER — Other Ambulatory Visit: Payer: Self-pay | Admitting: Radiology

## 2021-04-29 MED ORDER — HYDROCODONE-ACETAMINOPHEN 5-325 MG PO TABS: 1.0000 | ORAL_TABLET | Freq: Four times a day (QID) | ORAL | 0 refills | Status: DC | PRN

## 2021-04-29 NOTE — Telephone Encounter (Signed)
Patient called, requested refill hydrocodone.  [address]

## 2021-05-01 ENCOUNTER — Telehealth: Payer: Self-pay | Admitting: Orthopedic Surgery

## 2021-05-01 NOTE — Telephone Encounter (Signed)
Patient has requested to postpone and reschedule her fracture care follow up visit on 05/06/21, due to transportation. States due to husband's work - requests late afternoon, however, may possibly be able to come a little earlier than that time, if Dr Dallas Schimke may be able to accomodate. Otherwise, appointment has been rescheduled to next late afternoon, 05/18/21, 3:00pm - please advise.

## 2021-05-05 ENCOUNTER — Encounter: Payer: Medicare PPO | Admitting: Orthopedic Surgery

## 2021-05-05 ENCOUNTER — Ambulatory Visit: Payer: Medicare PPO | Admitting: Podiatry

## 2021-05-05 ENCOUNTER — Other Ambulatory Visit: Payer: Self-pay

## 2021-05-05 DIAGNOSIS — B351 Tinea unguium: Secondary | ICD-10-CM | POA: Diagnosis not present

## 2021-05-05 DIAGNOSIS — S90221A Contusion of right lesser toe(s) with damage to nail, initial encounter: Secondary | ICD-10-CM | POA: Diagnosis not present

## 2021-05-05 NOTE — Telephone Encounter (Signed)
LVM for pt to call back.

## 2021-05-08 ENCOUNTER — Telehealth: Payer: Self-pay

## 2021-05-08 NOTE — Telephone Encounter (Signed)
Hydrocodone-Acetaminophen  5/325 mg     PATIENT USES [facility] ON [address]

## 2021-05-10 NOTE — Progress Notes (Signed)
Subjective: 60 year old female presents the office today with her husband for concern of a dark spot on the right big toe.  This occurred after she had a fall injuring her shoulder.  She has no pain to her feet otherwise from the fall.  No pain to the toenail no redness or drainage or any swelling.  Objective: AAO x3, NAD DP/PT pulses palpable bilaterally, CRT less than 3 seconds In particular on the right hallux toenail there is a small amount of dried blood present along the central aspect of the nail.  There is no edema, erythema or signs of infection of the toenail sites.  The nail itself is mildly hypertrophic, dystrophic with yellow-brown discoloration.  The other nails are elongated and dystrophic.  No pain of the toes.  No swelling present. No pain with calf compression, swelling, warmth, erythema  Assessment: Subungual hematoma right hallux toenail; type 2 diabetes with neuropathy  Plan: -All treatment options discussed with the patient including all alternatives, risks, complications.  -As a courtesy debride the nail with any complications or bleeding.  Debrided the other nails.  Discussed with her that the blood should grow out with the nail.  There is a chance that the nail could come off on its own.  Monitor for any signs or symptoms of infection. -Patient encouraged to call the office with any questions, concerns, change in symptoms.   Vivi Barrack DPM

## 2021-05-11 ENCOUNTER — Other Ambulatory Visit (HOSPITAL_COMMUNITY): Payer: Self-pay | Admitting: Nurse Practitioner

## 2021-05-11 DIAGNOSIS — Z1231 Encounter for screening mammogram for malignant neoplasm of breast: Secondary | ICD-10-CM

## 2021-05-11 NOTE — Telephone Encounter (Signed)
Spoke with patient and advised her as per Dr.Cairns response she should be taking otc pain medications at this point. She said she would take Advil or Aleve. She stated Tylenol doesn't touch her. She verbalized understanding of everything discussed.

## 2021-05-13 ENCOUNTER — Telehealth: Payer: Self-pay

## 2021-05-13 NOTE — Telephone Encounter (Signed)
ERROR

## 2021-05-15 ENCOUNTER — Other Ambulatory Visit (INDEPENDENT_AMBULATORY_CARE_PROVIDER_SITE_OTHER): Payer: Self-pay | Admitting: Nurse Practitioner

## 2021-05-18 ENCOUNTER — Ambulatory Visit: Payer: Medicare PPO

## 2021-05-18 ENCOUNTER — Ambulatory Visit (INDEPENDENT_AMBULATORY_CARE_PROVIDER_SITE_OTHER): Payer: Medicare PPO | Admitting: Orthopedic Surgery

## 2021-05-18 ENCOUNTER — Other Ambulatory Visit: Payer: Self-pay

## 2021-05-18 ENCOUNTER — Encounter: Payer: Self-pay | Admitting: Orthopedic Surgery

## 2021-05-18 DIAGNOSIS — S42202A Unspecified fracture of upper end of left humerus, initial encounter for closed fracture: Secondary | ICD-10-CM

## 2021-05-18 MED ORDER — LANTUS SOLOSTAR 100 UNIT/ML ~~LOC~~ SOPN: PEN_INJECTOR | SUBCUTANEOUS | 3 refills | Status: DC

## 2021-05-18 NOTE — Progress Notes (Signed)
Orthopaedic Clinic Return  Assessment: Melanie Spencer is a 60 y.o. female with the following: Left proximal humerus fracture, in setting of previous stroke and limited function  Plan: Radiographs remained stable.  Injury was sustained greater than 2 months ago.  At this point, she can come out of the sling only.  She should continue to take Tylenol or ibuprofen as needed for pain.  She is complaining of some shooting pains into her small finger, which is likely related to sling use.  I anticipate that this will gradually improve.  She is taking gabapentin, which could be helpful for this pain.  Unfortunately, she was a patient of Dr. Patty Sermons, and since he has passed away, she is waiting to establish her care with another physician.  As such, she is out of insulin, and she has asked me to refill her prescription in order to maintain appropriate blood glucose levels.  I have refilled this medication based on the parameters listed in her chart.  I will also reach out to her new primary care provider to give his office an update.   Patient is to contact the clinic if she has any further issues.  Otherwise, she will be follow-up as needed.   Meds ordered this encounter  Medications   LANTUS SOLOSTAR 100 UNIT/ML Solostar Pen    Sig: ADMINISTER 40 UNITS UNDER THE SKIN AT BEDTIME    Dispense:  15 mL    Refill:  3    There is no height or weight on file to calculate BMI.  Follow-up: Return if symptoms worsen or fail to improve.   Subjective:  Chief Complaint  Patient presents with   Routine Post Op    DOS 03/10/21// follow up with xray    History of Present Illness: Melanie Spencer is a 60 y.o. female who returns to clinic for repeat evaluation of her left shoulder.  She sustained a fall approximately 2 months ago.  Her pain is improved.  She is no longer taking narcotic pain medications.  She has gradually been coming out of the sling, leaving it off for a few hours at a time.  However, she  does continue to use the sling most of the day.  She is describing some shooting pains into her left small finger.  This is worse at night.  She is requesting a refill of her insulin, as her primary care provider is unable to give this medication for her anymore.  An upcoming appointment, establish care with Bjorn Pippin, NP.   Review of Systems: No fevers or chills No numbness or tingling No chest pain No shortness of breath No bowel or bladder dysfunction No GI distress No headaches   Objective: LMP 03/02/2012   Physical Exam:  Alert and oriented.  No acute distress.  Minimal swelling about the left shoulder.  No bruising is appreciated.  She has intact sensation throughout the left hand.  Limited motion in her hand secondary to previous stroke.  She tolerates gentle passive range of motion of the left shoulder.  Sling remains in place.  Fingers are warm and well-perfused.  IMAGING: I personally ordered and reviewed the following images:   Left shoulder obtained in clinic today, and compared to previous x-rays.  Her proximal humerus fracture remains in stable alignment.  There has been some interval consolidation around the fracture.  No new injuries are noted.  Persistent pseudosubluxation of the glenohumeral joint.  Impression: Left proximal humerus fracture in stable alignment  Oliver Barre, MD 05/18/2021 3:55 PM

## 2021-06-17 ENCOUNTER — Other Ambulatory Visit (INDEPENDENT_AMBULATORY_CARE_PROVIDER_SITE_OTHER): Payer: Self-pay | Admitting: Nurse Practitioner

## 2021-06-20 ENCOUNTER — Other Ambulatory Visit (INDEPENDENT_AMBULATORY_CARE_PROVIDER_SITE_OTHER): Payer: Self-pay | Admitting: Nurse Practitioner

## 2021-06-20 DIAGNOSIS — E1165 Type 2 diabetes mellitus with hyperglycemia: Secondary | ICD-10-CM

## 2021-06-21 ENCOUNTER — Other Ambulatory Visit (INDEPENDENT_AMBULATORY_CARE_PROVIDER_SITE_OTHER): Payer: Self-pay | Admitting: Nurse Practitioner

## 2021-06-22 ENCOUNTER — Ambulatory Visit: Payer: Medicare PPO | Admitting: Nurse Practitioner

## 2021-06-29 ENCOUNTER — Ambulatory Visit (HOSPITAL_COMMUNITY): Payer: Medicare PPO

## 2021-07-08 ENCOUNTER — Encounter (INDEPENDENT_AMBULATORY_CARE_PROVIDER_SITE_OTHER): Payer: Medicare PPO | Admitting: Nurse Practitioner

## 2021-07-09 ENCOUNTER — Other Ambulatory Visit: Payer: Self-pay

## 2021-07-09 ENCOUNTER — Ambulatory Visit (HOSPITAL_COMMUNITY)
Admission: RE | Admit: 2021-07-09 | Discharge: 2021-07-09 | Disposition: A | Discharge status: Home / Self Care | Payer: Medicare PPO | Source: Ambulatory Visit | Attending: Nurse Practitioner | Admitting: Nurse Practitioner

## 2021-07-09 DIAGNOSIS — Z1231 Encounter for screening mammogram for malignant neoplasm of breast: Secondary | ICD-10-CM | POA: Diagnosis not present

## 2021-07-10 NOTE — Progress Notes (Signed)
Negative mammogram repeat in 1 year.

## 2021-07-13 ENCOUNTER — Encounter: Payer: Self-pay | Admitting: Obstetrics & Gynecology

## 2021-07-13 ENCOUNTER — Ambulatory Visit (INDEPENDENT_AMBULATORY_CARE_PROVIDER_SITE_OTHER): Payer: Medicare PPO | Admitting: Obstetrics & Gynecology

## 2021-07-13 ENCOUNTER — Other Ambulatory Visit (HOSPITAL_COMMUNITY)
Admission: RE | Admit: 2021-07-13 | Discharge: 2021-07-13 | Disposition: A | Discharge status: Home / Self Care | Payer: Medicare PPO | Source: Ambulatory Visit | Attending: Obstetrics & Gynecology | Admitting: Obstetrics & Gynecology

## 2021-07-13 ENCOUNTER — Other Ambulatory Visit: Payer: Self-pay

## 2021-07-13 VITALS — BP 158/75 | HR 80

## 2021-07-13 DIAGNOSIS — Z1151 Encounter for screening for human papillomavirus (HPV): Secondary | ICD-10-CM | POA: Insufficient documentation

## 2021-07-13 DIAGNOSIS — Z01419 Encounter for gynecological examination (general) (routine) without abnormal findings: Secondary | ICD-10-CM | POA: Insufficient documentation

## 2021-07-13 DIAGNOSIS — Z1212 Encounter for screening for malignant neoplasm of rectum: Secondary | ICD-10-CM

## 2021-07-13 DIAGNOSIS — Z1211 Encounter for screening for malignant neoplasm of colon: Secondary | ICD-10-CM | POA: Diagnosis not present

## 2021-07-13 NOTE — Progress Notes (Signed)
Subjective:     Melanie Spencer is a 60 y.o. female here for a routine exam.  Patient's last menstrual period was 03/02/2012. [id number] Birth Control Method:  post menopausal Menstrual Calendar(currently): amenorrheic  Current complaints: none.   Current acute medical issues:  CVA   Recent Gynecologic History Patient's last menstrual period was 03/02/2012. Last Pap: 2019,  normal Last mammogram: 07/09/21,  normal  Past Medical History:  Diagnosis Date   Cerebrovascular disease 05/23/2019   Essential hypertension, benign 05/23/2019   History of stroke 2007   left leg weakness   HLD (hyperlipidemia) 05/23/2019   Morbid obesity (HCC) 05/23/2019   Type II diabetes mellitus, uncontrolled 05/23/2019   Vitamin D deficiency     Past Surgical History:  Procedure Laterality Date   AMPUTATION Right 11/09/2019   Procedure: 5TH AMPUTATION RAY;  Surgeon: Vivi Barrack, DPM;  Location: [facility] OR;  Service: Podiatry;  Laterality: Right;   CHOLECYSTECTOMY     COLONOSCOPY N/A 11/12/2020   Procedure: COLONOSCOPY;  Surgeon: Corbin Ade, MD;  [facility]: [facility];  Service: Endoscopy;  Laterality: N/A;  ASA III / PM procedure   POLYPECTOMY  11/12/2020   Procedure: POLYPECTOMY;  Surgeon: Corbin Ade, MD;  [facility]: [facility];  Service: Endoscopy;;    OB History     Gravida  0   Para  0   Term  0   Preterm  0   AB  0   Living  0      SAB  0   IAB  0   Ectopic  0   Multiple  0   Live Births  0           Social History   Socioeconomic History   Marital status: Married    Spouse name: Not on file   Number of children: Not on file   Years of education: 13   Highest education level: Some college, no degree  Occupational History   Occupation: Disability    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   Social Determinants of Health   Financial Resource Strain: Low Risk    Difficulty of Paying Living Expenses: Not hard at all  Food Insecurity: No Food Insecurity   Worried About Programme researcher, broadcasting/film/video in the Last Year: Never true   Ran Out of Food in the Last Year: Never true  Transportation Needs: No Transportation Needs   Lack of Transportation (Medical): No   Lack of Transportation (Non-Medical): No  Physical Activity: Inactive   Days of Exercise per Week: 0 days   Minutes of Exercise per Session: 0 min  Stress: No Stress Concern Present   Feeling of Stress : Not at all  Social Connections: Socially Integrated   Frequency of Communication with Friends and Family: Three  times a week   Frequency of Social Gatherings with Friends and Family: Once a week   Attends Religious Services: More than 4 times per year   Active Member of [employer] or Organizations: Yes   Attends Music therapist: More than 4 times per year   Marital Status: Married    Family History  Problem Relation Age of Onset   Heart disease Mother    Diabetes Mother    Hypertension Mother    Cancer Father        Lung   Hypertension Sister    Macular degeneration Sister    Appendicitis Brother    Hypertension Sister    Diabetes Sister      Current Outpatient Medications:    amLODipine (NORVASC) 2.5 MG tablet, Take 1 tablet by mouth daily., Disp: , Rfl:    aspirin 325 MG tablet, Take by mouth., Disp: , Rfl:    cholecalciferol (VITAMIN D) 1000 UNITS tablet, Take 1,000 Units by mouth daily. ,  Disp: , Rfl:    Dulaglutide (TRULICITY) 4.5 0000000 SOPN, Inject 4.5 mg as directed once a week., Disp: 2 mL, Rfl: 3   fluticasone (FLONASE) 50 MCG/ACT nasal spray, SHAKE LIQUID AND USE 2 SPRAYS IN EACH NOSTRIL EVERY DAY, Disp: 16 g, Rfl: 3   gabapentin (NEURONTIN) 300 MG capsule, Take by mouth., Disp: , Rfl:    glucose blood (ACCU-CHEK GUIDE) test strip, TEST FOUR TIMES A DAY, Disp: 300 strip, Rfl: 1   HYDROcodone-acetaminophen (NORCO/VICODIN) 5-325 MG tablet, Take 1 tablet by mouth every 6 (six) hours as needed for moderate pain., Disp: 15 tablet, Rfl: 0   Insulin Pen Needle 29G X 12.7MM MISC, 1 Units by Does not apply route daily at 12 noon., Disp: 100 each, Rfl: 3   LANTUS SOLOSTAR 100 UNIT/ML Solostar Pen, ADMINISTER 40 UNITS UNDER THE SKIN AT BEDTIME, Disp: 15 mL, Rfl: 3   metFORMIN (GLUCOPHAGE) 1000 MG tablet, Take 1 tablet (1,000 mg total) by mouth 2 (two) times daily., Disp: 60 tablet, Rfl: 2   omeprazole (PRILOSEC) 20 MG capsule, Take 1 capsule by mouth daily., Disp: , Rfl:    pioglitazone (ACTOS) 15 MG tablet, Take 1 tablet by mouth daily., Disp: , Rfl:    ramipril (ALTACE) 10 MG capsule, TAKE 2 CAPSULES(20 MG) BY MOUTH DAILY, Disp: 180 capsule, Rfl: 0   simvastatin (ZOCOR) 40 MG tablet, Take 1 tablet by mouth at bedtime., Disp: , Rfl:    fesoterodine (TOVIAZ) 4 MG TB24 tablet, TAKE 1 TABLET(4 MG) BY MOUTH DAILY (Patient not taking: Reported on 07/13/2021), Disp: 30 tablet, Rfl: 2  Review of Systems  Review of Systems  Constitutional: Negative for fever, chills, weight loss, malaise/fatigue and diaphoresis.  HENT: Negative for hearing loss, ear pain, nosebleeds, congestion, sore throat, neck pain, tinnitus and ear discharge.   Eyes: Negative for blurred vision, double vision, photophobia, pain, discharge and redness.  Respiratory: Negative for cough, hemoptysis, sputum production, shortness of breath, wheezing and stridor.   Cardiovascular: Negative for chest pain, palpitations,  orthopnea, claudication, leg swelling and PND.  Gastrointestinal: negative for abdominal pain. Negative for heartburn, nausea, vomiting, diarrhea, constipation, blood in stool and melena.  Genitourinary: Negative for dysuria, urgency, frequency, hematuria and flank pain.  Musculoskeletal: Negative for myalgias, back pain, joint pain and falls.  Skin: Negative for itching and rash.  Neurological: Negative for dizziness, tingling, tremors, sensory change, speech change, focal weakness, seizures, loss of consciousness, weakness and headaches.  Endo/Heme/Allergies: Negative for environmental allergies and  polydipsia. Does not bruise/bleed easily.  Psychiatric/Behavioral: Negative for depression, suicidal ideas, hallucinations, memory loss and substance abuse. The patient is not nervous/anxious and does not have insomnia.        Objective:  Blood pressure (!) 158/75, pulse 80, last menstrual period 03/02/2012.   Physical Exam  Vitals reviewed. Constitutional: She is oriented to person, place, and time. She appears well-developed and well-nourished.  HENT:  Head: Normocephalic and atraumatic.        Right Ear: External ear normal.  Left Ear: External ear normal.  Nose: Nose normal.  Mouth/Throat: Oropharynx is clear and moist.  Eyes: Conjunctivae and EOM are normal. Pupils are equal, round, and reactive to light. Right eye exhibits no discharge. Left eye exhibits no discharge. No scleral icterus.  Neck: Normal range of motion. Neck supple. No tracheal deviation present. No thyromegaly present.  Cardiovascular: Normal rate, regular rhythm, normal heart sounds and intact distal pulses.  Exam reveals no gallop and no friction rub.   No murmur heard. Respiratory: Effort normal and breath sounds normal. No respiratory distress. She has no wheezes. She has no rales. She exhibits no tenderness.  GI: Soft. Bowel sounds are normal. She exhibits no distension and no mass. There is no tenderness. There is  no rebound and no guarding.  Genitourinary:  Breasts no masses skin changes or nipple changes bilaterally      Vulva is normal without lesions Vagina is pink moist without discharge Cervix normal in appearance and pap is done Uterus is normal size shape and contour Adnexa is negative with normal sized ovaries  {Rectal    hemoccult negative, normal tone, no masses  Musculoskeletal: Normal range of motion. She exhibits no edema and no tenderness.  Neurological: She is alert and oriented to person, place, and time. She has normal reflexes. She displays normal reflexes. No cranial nerve deficit. She exhibits normal muscle tone. Coordination normal.  Skin: Skin is warm and dry. No rash noted. No erythema. No pallor.  Psychiatric: She has a normal mood and affect. Her behavior is normal. Judgment and thought content normal.       Medications Ordered at today's visit: No orders of the defined types were placed in this encounter.   Other orders placed at today's visit: No orders of the defined types were placed in this encounter.     Assessment:    Normal Gyn exam.    Plan:    Follow up in: 3 years.     No follow-ups on file.

## 2021-07-15 LAB — CYTOLOGY - PAP
Comment: NEGATIVE
Diagnosis: NEGATIVE
High risk HPV: NEGATIVE

## 2021-08-05 ENCOUNTER — Ambulatory Visit: Payer: Medicare PPO | Admitting: Podiatry

## 2021-08-06 ENCOUNTER — Ambulatory Visit: Payer: Medicare PPO | Admitting: Podiatry

## 2021-08-06 ENCOUNTER — Other Ambulatory Visit: Payer: Self-pay

## 2021-08-06 DIAGNOSIS — M79674 Pain in right toe(s): Secondary | ICD-10-CM | POA: Diagnosis not present

## 2021-08-06 DIAGNOSIS — M79675 Pain in left toe(s): Secondary | ICD-10-CM | POA: Diagnosis not present

## 2021-08-06 DIAGNOSIS — B351 Tinea unguium: Secondary | ICD-10-CM

## 2021-08-06 DIAGNOSIS — E1149 Type 2 diabetes mellitus with other diabetic neurological complication: Secondary | ICD-10-CM | POA: Diagnosis not present

## 2021-08-11 ENCOUNTER — Ambulatory Visit: Payer: Medicare PPO | Admitting: Nurse Practitioner

## 2021-08-11 NOTE — Progress Notes (Signed)
Subjective: 61 y.o. returns the office today for painful, elongated, thickened toenails which she cannot trim herself. Denies any redness or drainage around the nails.  Appears to right toenail is growing out.  There is less darkening.  No extension of any fragments into the surrounding skin they have noticed.  No drainage or pus.  Denies any acute changes since last appointment and no new complaints today. Denies any systemic complaints such as fevers, chills, nausea, vomiting.   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 Nails hypertrophic, dystrophic, elongated, brittle, discolored 9. There is tenderness overlying the nails 1-5 bilaterally. There is no surrounding erythema or drainage along the nail sites.  Right hallux nail has some mild subungual hematoma still evident but appears to be improving.  No extension of hyperpigmentation of the surrounding skin and there is no signs of infection clinically. No open lesions or pre-ulcerative lesions are identified. Incision for the right foot surgery is well-healed with a scar no new ulcerations are noted. No pain with calf compression, swelling, warmth, erythema.  Assessment: Patient presents with symptomatic onychomycosis  Plan: -Treatment options including alternatives, risks, complications were discussed -Nails sharply debrided 9 without complication/bleeding. -Discussed daily foot inspection. If there are any changes, to call the office immediately.  -Follow-up in 3 months or sooner if any problems are to arise. In the meantime, encouraged to call the office with any questions, concerns, changes symptoms.  Ovid Curd, DPM

## 2021-08-14 ENCOUNTER — Other Ambulatory Visit (INDEPENDENT_AMBULATORY_CARE_PROVIDER_SITE_OTHER): Payer: Self-pay | Admitting: Nurse Practitioner

## 2021-08-14 DIAGNOSIS — N3941 Urge incontinence: Secondary | ICD-10-CM

## 2021-08-31 ENCOUNTER — Other Ambulatory Visit (INDEPENDENT_AMBULATORY_CARE_PROVIDER_SITE_OTHER): Payer: Self-pay | Admitting: Nurse Practitioner

## 2021-10-13 ENCOUNTER — Encounter (HOSPITAL_COMMUNITY): Payer: Self-pay | Admitting: Emergency Medicine

## 2021-10-13 ENCOUNTER — Other Ambulatory Visit: Payer: Self-pay

## 2021-10-13 ENCOUNTER — Inpatient Hospital Stay (HOSPITAL_COMMUNITY)
Admission: EM | Admit: 2021-10-13 | Discharge: 2021-10-17 | DRG: 689 | Disposition: A | Discharge status: Skilled Nursing Facility | Payer: Medicare PPO | Attending: Internal Medicine | Admitting: Internal Medicine

## 2021-10-13 DIAGNOSIS — Z833 Family history of diabetes mellitus: Secondary | ICD-10-CM

## 2021-10-13 DIAGNOSIS — N179 Acute kidney failure, unspecified: Secondary | ICD-10-CM | POA: Diagnosis present

## 2021-10-13 DIAGNOSIS — Z794 Long term (current) use of insulin: Secondary | ICD-10-CM

## 2021-10-13 DIAGNOSIS — E669 Obesity, unspecified: Secondary | ICD-10-CM | POA: Diagnosis present

## 2021-10-13 DIAGNOSIS — N3000 Acute cystitis without hematuria: Secondary | ICD-10-CM | POA: Diagnosis not present

## 2021-10-13 DIAGNOSIS — Z713 Dietary counseling and surveillance: Secondary | ICD-10-CM

## 2021-10-13 DIAGNOSIS — Z9049 Acquired absence of other specified parts of digestive tract: Secondary | ICD-10-CM

## 2021-10-13 DIAGNOSIS — E872 Acidosis, unspecified: Secondary | ICD-10-CM | POA: Diagnosis present

## 2021-10-13 DIAGNOSIS — Z89421 Acquired absence of other right toe(s): Secondary | ICD-10-CM

## 2021-10-13 DIAGNOSIS — B961 Klebsiella pneumoniae [K. pneumoniae] as the cause of diseases classified elsewhere: Secondary | ICD-10-CM | POA: Diagnosis present

## 2021-10-13 DIAGNOSIS — Z87891 Personal history of nicotine dependence: Secondary | ICD-10-CM

## 2021-10-13 DIAGNOSIS — E860 Dehydration: Secondary | ICD-10-CM | POA: Diagnosis present

## 2021-10-13 DIAGNOSIS — M79605 Pain in left leg: Secondary | ICD-10-CM

## 2021-10-13 DIAGNOSIS — E875 Hyperkalemia: Secondary | ICD-10-CM | POA: Diagnosis present

## 2021-10-13 DIAGNOSIS — Z1611 Resistance to penicillins: Secondary | ICD-10-CM | POA: Diagnosis present

## 2021-10-13 DIAGNOSIS — A419 Sepsis, unspecified organism: Secondary | ICD-10-CM

## 2021-10-13 DIAGNOSIS — I69354 Hemiplegia and hemiparesis following cerebral infarction affecting left non-dominant side: Secondary | ICD-10-CM

## 2021-10-13 DIAGNOSIS — Z28310 Unvaccinated for covid-19: Secondary | ICD-10-CM

## 2021-10-13 DIAGNOSIS — E559 Vitamin D deficiency, unspecified: Secondary | ICD-10-CM | POA: Diagnosis present

## 2021-10-13 DIAGNOSIS — M1712 Unilateral primary osteoarthritis, left knee: Secondary | ICD-10-CM | POA: Diagnosis present

## 2021-10-13 DIAGNOSIS — G9341 Metabolic encephalopathy: Secondary | ICD-10-CM | POA: Diagnosis not present

## 2021-10-13 DIAGNOSIS — N390 Urinary tract infection, site not specified: Secondary | ICD-10-CM

## 2021-10-13 DIAGNOSIS — E1169 Type 2 diabetes mellitus with other specified complication: Secondary | ICD-10-CM

## 2021-10-13 DIAGNOSIS — Z7982 Long term (current) use of aspirin: Secondary | ICD-10-CM

## 2021-10-13 DIAGNOSIS — E785 Hyperlipidemia, unspecified: Secondary | ICD-10-CM | POA: Diagnosis present

## 2021-10-13 DIAGNOSIS — K529 Noninfective gastroenteritis and colitis, unspecified: Secondary | ICD-10-CM | POA: Diagnosis present

## 2021-10-13 DIAGNOSIS — K219 Gastro-esophageal reflux disease without esophagitis: Secondary | ICD-10-CM | POA: Diagnosis present

## 2021-10-13 DIAGNOSIS — D649 Anemia, unspecified: Secondary | ICD-10-CM | POA: Diagnosis present

## 2021-10-13 DIAGNOSIS — I10 Essential (primary) hypertension: Secondary | ICD-10-CM | POA: Diagnosis present

## 2021-10-13 DIAGNOSIS — Z79899 Other long term (current) drug therapy: Secondary | ICD-10-CM

## 2021-10-13 DIAGNOSIS — Z7984 Long term (current) use of oral hypoglycemic drugs: Secondary | ICD-10-CM

## 2021-10-13 DIAGNOSIS — Z6839 Body mass index (BMI) 39.0-39.9, adult: Secondary | ICD-10-CM

## 2021-10-13 DIAGNOSIS — E1165 Type 2 diabetes mellitus with hyperglycemia: Secondary | ICD-10-CM | POA: Diagnosis present

## 2021-10-13 DIAGNOSIS — Z8249 Family history of ischemic heart disease and other diseases of the circulatory system: Secondary | ICD-10-CM

## 2021-10-13 DIAGNOSIS — Z7985 Long-term (current) use of injectable non-insulin antidiabetic drugs: Secondary | ICD-10-CM

## 2021-10-13 LAB — COMPREHENSIVE METABOLIC PANEL
ALT: 13 U/L (ref 0–44)
AST: 11 U/L — ABNORMAL LOW (ref 15–41)
Albumin: 3.5 g/dL (ref 3.5–5.0)
Alkaline Phosphatase: 82 U/L (ref 38–126)
Anion gap: 8 (ref 5–15)
BUN: 51 mg/dL — ABNORMAL HIGH (ref 8–23)
CO2: 17 mmol/L — ABNORMAL LOW (ref 22–32)
Calcium: 9.1 mg/dL (ref 8.9–10.3)
Chloride: 110 mmol/L (ref 98–111)
Creatinine, Ser: 1.76 mg/dL — ABNORMAL HIGH (ref 0.44–1.00)
GFR, Estimated: 33 mL/min — ABNORMAL LOW (ref >60)
Glucose, Bld: 191 mg/dL — ABNORMAL HIGH (ref 70–99)
Potassium: 5.1 mmol/L (ref 3.5–5.1)
Sodium: 135 mmol/L (ref 135–145)
Total Bilirubin: 0.5 mg/dL (ref 0.3–1.2)
Total Protein: 7.4 g/dL (ref 6.5–8.1)

## 2021-10-13 LAB — CBC
HCT: 34.7 % — ABNORMAL LOW (ref 36.0–46.0)
Hemoglobin: 10.3 g/dL — ABNORMAL LOW (ref 12.0–15.0)
MCH: 25.8 pg — ABNORMAL LOW (ref 26.0–34.0)
MCHC: 29.7 g/dL — ABNORMAL LOW (ref 30.0–36.0)
MCV: 86.8 fL (ref 80.0–100.0)
Platelets: 392 10*3/uL (ref 150–400)
RBC: 4 MIL/uL (ref 3.87–5.11)
RDW: 15.2 % (ref 11.5–15.5)
WBC: 9.2 10*3/uL (ref 4.0–10.5)
nRBC: 0 % (ref 0.0–0.2)

## 2021-10-13 LAB — LIPASE, BLOOD: Lipase: 27 U/L (ref 11–51)

## 2021-10-13 NOTE — ED Triage Notes (Signed)
Pt seen by PCP yesterday and dx with stomach bug. Returns tonight for continued diarrhea. ?

## 2021-10-14 ENCOUNTER — Emergency Department (HOSPITAL_COMMUNITY): Payer: Medicare PPO

## 2021-10-14 ENCOUNTER — Inpatient Hospital Stay (HOSPITAL_COMMUNITY): Payer: Medicare PPO

## 2021-10-14 DIAGNOSIS — G9341 Metabolic encephalopathy: Secondary | ICD-10-CM | POA: Diagnosis present

## 2021-10-14 DIAGNOSIS — N390 Urinary tract infection, site not specified: Secondary | ICD-10-CM

## 2021-10-14 DIAGNOSIS — E1169 Type 2 diabetes mellitus with other specified complication: Secondary | ICD-10-CM | POA: Diagnosis not present

## 2021-10-14 DIAGNOSIS — E875 Hyperkalemia: Secondary | ICD-10-CM | POA: Diagnosis present

## 2021-10-14 DIAGNOSIS — N3000 Acute cystitis without hematuria: Secondary | ICD-10-CM | POA: Diagnosis present

## 2021-10-14 DIAGNOSIS — K219 Gastro-esophageal reflux disease without esophagitis: Secondary | ICD-10-CM | POA: Diagnosis present

## 2021-10-14 DIAGNOSIS — E872 Acidosis, unspecified: Secondary | ICD-10-CM | POA: Diagnosis present

## 2021-10-14 DIAGNOSIS — E1165 Type 2 diabetes mellitus with hyperglycemia: Secondary | ICD-10-CM | POA: Diagnosis present

## 2021-10-14 DIAGNOSIS — B961 Klebsiella pneumoniae [K. pneumoniae] as the cause of diseases classified elsewhere: Secondary | ICD-10-CM | POA: Diagnosis present

## 2021-10-14 DIAGNOSIS — I69354 Hemiplegia and hemiparesis following cerebral infarction affecting left non-dominant side: Secondary | ICD-10-CM | POA: Diagnosis not present

## 2021-10-14 DIAGNOSIS — Z7982 Long term (current) use of aspirin: Secondary | ICD-10-CM | POA: Diagnosis not present

## 2021-10-14 DIAGNOSIS — Z79899 Other long term (current) drug therapy: Secondary | ICD-10-CM | POA: Diagnosis not present

## 2021-10-14 DIAGNOSIS — D649 Anemia, unspecified: Secondary | ICD-10-CM | POA: Diagnosis present

## 2021-10-14 DIAGNOSIS — N179 Acute kidney failure, unspecified: Secondary | ICD-10-CM | POA: Diagnosis present

## 2021-10-14 DIAGNOSIS — E669 Obesity, unspecified: Secondary | ICD-10-CM

## 2021-10-14 DIAGNOSIS — Z794 Long term (current) use of insulin: Secondary | ICD-10-CM | POA: Diagnosis not present

## 2021-10-14 DIAGNOSIS — E785 Hyperlipidemia, unspecified: Secondary | ICD-10-CM

## 2021-10-14 DIAGNOSIS — E559 Vitamin D deficiency, unspecified: Secondary | ICD-10-CM | POA: Diagnosis present

## 2021-10-14 DIAGNOSIS — M1712 Unilateral primary osteoarthritis, left knee: Secondary | ICD-10-CM | POA: Diagnosis present

## 2021-10-14 DIAGNOSIS — E860 Dehydration: Secondary | ICD-10-CM | POA: Diagnosis present

## 2021-10-14 DIAGNOSIS — I10 Essential (primary) hypertension: Secondary | ICD-10-CM | POA: Diagnosis present

## 2021-10-14 DIAGNOSIS — Z6839 Body mass index (BMI) 39.0-39.9, adult: Secondary | ICD-10-CM | POA: Diagnosis not present

## 2021-10-14 DIAGNOSIS — M79605 Pain in left leg: Secondary | ICD-10-CM

## 2021-10-14 DIAGNOSIS — K529 Noninfective gastroenteritis and colitis, unspecified: Secondary | ICD-10-CM

## 2021-10-14 DIAGNOSIS — K2100 Gastro-esophageal reflux disease with esophagitis, without bleeding: Secondary | ICD-10-CM

## 2021-10-14 DIAGNOSIS — Z7984 Long term (current) use of oral hypoglycemic drugs: Secondary | ICD-10-CM | POA: Diagnosis not present

## 2021-10-14 DIAGNOSIS — A419 Sepsis, unspecified organism: Secondary | ICD-10-CM

## 2021-10-14 DIAGNOSIS — Z7985 Long-term (current) use of injectable non-insulin antidiabetic drugs: Secondary | ICD-10-CM | POA: Diagnosis not present

## 2021-10-14 DIAGNOSIS — Z1611 Resistance to penicillins: Secondary | ICD-10-CM | POA: Diagnosis present

## 2021-10-14 DIAGNOSIS — E7849 Other hyperlipidemia: Secondary | ICD-10-CM

## 2021-10-14 LAB — GLUCOSE, CAPILLARY
Glucose-Capillary: 177 mg/dL — ABNORMAL HIGH (ref 70–99)
Glucose-Capillary: 160 mg/dL — ABNORMAL HIGH (ref 70–99)
Glucose-Capillary: 142 mg/dL — ABNORMAL HIGH (ref 70–99)
Glucose-Capillary: 182 mg/dL — ABNORMAL HIGH (ref 70–99)

## 2021-10-14 LAB — GASTROINTESTINAL PANEL BY PCR, STOOL (REPLACES STOOL CULTURE)

## 2021-10-14 LAB — COMPREHENSIVE METABOLIC PANEL
ALT: 12 U/L (ref 0–44)
AST: 12 U/L — ABNORMAL LOW (ref 15–41)
Albumin: 3.4 g/dL — ABNORMAL LOW (ref 3.5–5.0)
Alkaline Phosphatase: 85 U/L (ref 38–126)
Anion gap: 8 (ref 5–15)
BUN: 51 mg/dL — ABNORMAL HIGH (ref 8–23)
CO2: 15 mmol/L — ABNORMAL LOW (ref 22–32)
Calcium: 8.9 mg/dL (ref 8.9–10.3)
Chloride: 112 mmol/L — ABNORMAL HIGH (ref 98–111)
Creatinine, Ser: 1.35 mg/dL — ABNORMAL HIGH (ref 0.44–1.00)
GFR, Estimated: 45 mL/min — ABNORMAL LOW (ref >60)
Glucose, Bld: 182 mg/dL — ABNORMAL HIGH (ref 70–99)
Potassium: 5.3 mmol/L — ABNORMAL HIGH (ref 3.5–5.1)
Sodium: 135 mmol/L (ref 135–145)
Total Bilirubin: 0.4 mg/dL (ref 0.3–1.2)
Total Protein: 7 g/dL (ref 6.5–8.1)

## 2021-10-14 LAB — CBC WITH DIFFERENTIAL/PLATELET
Abs Immature Granulocytes: 0.04 10*3/uL (ref 0.00–0.07)
Basophils Absolute: 0 10*3/uL (ref 0.0–0.1)
Basophils Relative: 1 %
Eosinophils Absolute: 0.1 10*3/uL (ref 0.0–0.5)
Eosinophils Relative: 1 %
HCT: 31.9 % — ABNORMAL LOW (ref 36.0–46.0)
Hemoglobin: 9.7 g/dL — ABNORMAL LOW (ref 12.0–15.0)
Immature Granulocytes: 1 %
Lymphocytes Relative: 18 %
Lymphs Abs: 1.6 10*3/uL (ref 0.7–4.0)
MCH: 25.8 pg — ABNORMAL LOW (ref 26.0–34.0)
MCHC: 30.4 g/dL (ref 30.0–36.0)
MCV: 84.8 fL (ref 80.0–100.0)
Monocytes Absolute: 0.5 10*3/uL (ref 0.1–1.0)
Monocytes Relative: 6 %
Neutro Abs: 6.2 10*3/uL (ref 1.7–7.7)
Neutrophils Relative %: 73 %
Platelets: 347 10*3/uL (ref 150–400)
RBC: 3.76 MIL/uL — ABNORMAL LOW (ref 3.87–5.11)
RDW: 14.9 % (ref 11.5–15.5)
WBC: 8.5 10*3/uL (ref 4.0–10.5)
nRBC: 0 % (ref 0.0–0.2)

## 2021-10-14 LAB — URINALYSIS, ROUTINE W REFLEX MICROSCOPIC
Bilirubin Urine: NEGATIVE
Glucose, UA: 50 mg/dL — AB
Hgb urine dipstick: NEGATIVE
Ketones, ur: NEGATIVE mg/dL
Nitrite: NEGATIVE
Protein, ur: 30 mg/dL — AB
Specific Gravity, Urine: 1.026 (ref 1.005–1.030)
pH: 5 (ref 5.0–8.0)

## 2021-10-14 LAB — MAGNESIUM: Magnesium: 1.9 mg/dL (ref 1.7–2.4)

## 2021-10-14 LAB — C DIFFICILE QUICK SCREEN W PCR REFLEX
C Diff antigen: NEGATIVE
C Diff interpretation: NOT DETECTED
C Diff toxin: NEGATIVE

## 2021-10-14 LAB — PHOSPHORUS: Phosphorus: 4 mg/dL (ref 2.5–4.6)

## 2021-10-14 LAB — HIV ANTIBODY (ROUTINE TESTING W REFLEX): HIV Screen 4th Generation wRfx: NONREACTIVE

## 2021-10-14 MED ORDER — SODIUM CHLORIDE 0.9 % IV SOLN
1.0000 g | Freq: Once | INTRAVENOUS | Status: AC
  Administered 2021-10-14: 1 g via INTRAVENOUS
  Filled 2021-10-14: qty 10

## 2021-10-14 MED ORDER — FENTANYL CITRATE PF 50 MCG/ML IJ SOSY
50.0000 ug | PREFILLED_SYRINGE | Freq: Once | INTRAMUSCULAR | Status: AC
  Administered 2021-10-14: 50 ug via INTRAVENOUS
  Filled 2021-10-14: qty 1

## 2021-10-14 MED ORDER — IOHEXOL 300 MG/ML  SOLN
100.0000 mL | Freq: Once | INTRAMUSCULAR | Status: AC | PRN
  Administered 2021-10-14: 75 mL via INTRAVENOUS

## 2021-10-14 MED ORDER — GABAPENTIN 300 MG PO CAPS
300.0000 mg | ORAL_CAPSULE | Freq: Every day | ORAL | Status: DC
  Administered 2021-10-14 – 2021-10-16 (×3): 300 mg via ORAL
  Filled 2021-10-14 (×3): qty 1

## 2021-10-14 MED ORDER — METHOCARBAMOL 1000 MG/10ML IJ SOLN
500.0000 mg | Freq: Four times a day (QID) | INTRAVENOUS | Status: DC | PRN
  Filled 2021-10-14: qty 5

## 2021-10-14 MED ORDER — LACTATED RINGERS IV BOLUS
1000.0000 mL | Freq: Once | INTRAVENOUS | Status: AC
  Administered 2021-10-14: 1000 mL via INTRAVENOUS

## 2021-10-14 MED ORDER — ONDANSETRON HCL 4 MG/2ML IJ SOLN: 4.0000 mg | Freq: Four times a day (QID) | INTRAMUSCULAR | Status: DC | PRN

## 2021-10-14 MED ORDER — INSULIN ASPART 100 UNIT/ML IJ SOLN
0.0000 [IU] | Freq: Three times a day (TID) | INTRAMUSCULAR | Status: DC
  Administered 2021-10-14 (×2): 3 [IU] via SUBCUTANEOUS
  Administered 2021-10-14: 2 [IU] via SUBCUTANEOUS
  Administered 2021-10-15: 3 [IU] via SUBCUTANEOUS
  Administered 2021-10-15: 2 [IU] via SUBCUTANEOUS
  Administered 2021-10-16: 3 [IU] via SUBCUTANEOUS
  Administered 2021-10-16: 2 [IU] via SUBCUTANEOUS
  Administered 2021-10-16 – 2021-10-17 (×3): 3 [IU] via SUBCUTANEOUS

## 2021-10-14 MED ORDER — AMLODIPINE BESYLATE 5 MG PO TABS
2.5000 mg | ORAL_TABLET | Freq: Every day | ORAL | Status: DC
  Administered 2021-10-14 – 2021-10-17 (×4): 2.5 mg via ORAL
  Filled 2021-10-14 (×4): qty 1

## 2021-10-14 MED ORDER — HEPARIN SODIUM (PORCINE) 5000 UNIT/ML IJ SOLN
5000.0000 [IU] | Freq: Three times a day (TID) | INTRAMUSCULAR | Status: DC
  Administered 2021-10-14 – 2021-10-17 (×9): 5000 [IU] via SUBCUTANEOUS
  Filled 2021-10-14 (×9): qty 1

## 2021-10-14 MED ORDER — SODIUM CHLORIDE 0.9 % IV SOLN: INTRAVENOUS | Status: DC

## 2021-10-14 MED ORDER — OXYCODONE HCL 5 MG PO TABS
5.0000 mg | ORAL_TABLET | ORAL | Status: DC | PRN
  Administered 2021-10-14 – 2021-10-16 (×5): 5 mg via ORAL
  Filled 2021-10-14 (×5): qty 1

## 2021-10-14 MED ORDER — METRONIDAZOLE 500 MG/100ML IV SOLN
500.0000 mg | Freq: Two times a day (BID) | INTRAVENOUS | Status: DC
  Administered 2021-10-14 – 2021-10-17 (×7): 500 mg via INTRAVENOUS
  Filled 2021-10-14 (×7): qty 100

## 2021-10-14 MED ORDER — INSULIN DETEMIR 100 UNIT/ML ~~LOC~~ SOLN
30.0000 [IU] | Freq: Every day | SUBCUTANEOUS | Status: DC
  Administered 2021-10-14 – 2021-10-16 (×3): 30 [IU] via SUBCUTANEOUS
  Filled 2021-10-14 (×4): qty 0.3

## 2021-10-14 MED ORDER — NYSTATIN 100000 UNIT/GM EX POWD
Freq: Three times a day (TID) | CUTANEOUS | Status: DC
  Filled 2021-10-14 (×3): qty 15

## 2021-10-14 MED ORDER — INSULIN ASPART 100 UNIT/ML IJ SOLN
0.0000 [IU] | Freq: Every day | INTRAMUSCULAR | Status: DC
  Administered 2021-10-16: 2 [IU] via SUBCUTANEOUS

## 2021-10-14 MED ORDER — SODIUM BICARBONATE 650 MG PO TABS
650.0000 mg | ORAL_TABLET | Freq: Two times a day (BID) | ORAL | Status: DC
Start: 2021-10-14 — End: 2021-10-16
  Administered 2021-10-14 – 2021-10-16 (×5): 650 mg via ORAL
  Filled 2021-10-14 (×5): qty 1

## 2021-10-14 MED ORDER — ASPIRIN 325 MG PO TABS
325.0000 mg | ORAL_TABLET | Freq: Every day | ORAL | Status: DC
  Administered 2021-10-14 – 2021-10-17 (×4): 325 mg via ORAL
  Filled 2021-10-14 (×4): qty 1

## 2021-10-14 MED ORDER — SODIUM ZIRCONIUM CYCLOSILICATE 10 G PO PACK
10.0000 g | PACK | Freq: Once | ORAL | Status: AC
  Administered 2021-10-14: 10 g via ORAL
  Filled 2021-10-14: qty 1

## 2021-10-14 MED ORDER — ONDANSETRON HCL 4 MG PO TABS
4.0000 mg | ORAL_TABLET | Freq: Four times a day (QID) | ORAL | Status: DC | PRN
  Administered 2021-10-16: 4 mg via ORAL
  Filled 2021-10-14: qty 1

## 2021-10-14 MED ORDER — GADOBUTROL 1 MMOL/ML IV SOLN
10.0000 mL | Freq: Once | INTRAVENOUS | Status: AC | PRN
  Administered 2021-10-14: 10 mL via INTRAVENOUS

## 2021-10-14 MED ORDER — SIMVASTATIN 20 MG PO TABS
40.0000 mg | ORAL_TABLET | Freq: Every day | ORAL | Status: DC
  Administered 2021-10-14 – 2021-10-16 (×3): 40 mg via ORAL
  Filled 2021-10-14 (×3): qty 2

## 2021-10-14 MED ORDER — MORPHINE SULFATE (PF) 2 MG/ML IV SOLN
2.0000 mg | INTRAVENOUS | Status: DC | PRN
  Administered 2021-10-15 – 2021-10-16 (×5): 2 mg via INTRAVENOUS
  Filled 2021-10-14 (×5): qty 1

## 2021-10-14 MED ORDER — ACETAMINOPHEN 325 MG PO TABS
650.0000 mg | ORAL_TABLET | Freq: Four times a day (QID) | ORAL | Status: DC | PRN
  Administered 2021-10-14: 650 mg via ORAL
  Filled 2021-10-14: qty 2

## 2021-10-14 MED ORDER — SODIUM CHLORIDE 0.9 % IV SOLN
1.0000 g | INTRAVENOUS | Status: DC
  Administered 2021-10-15 – 2021-10-16 (×2): 1 g via INTRAVENOUS
  Filled 2021-10-14 (×2): qty 10

## 2021-10-14 MED ORDER — ACETAMINOPHEN 650 MG RE SUPP: 650.0000 mg | Freq: Four times a day (QID) | RECTAL | Status: DC | PRN

## 2021-10-14 NOTE — Assessment & Plan Note (Addendum)
-  Patient reports excruciating pain in her left upper thigh which had improved but now a little worse yesterday ?-Abnormal finding on CT?calls?possible tumor at the -T12/L1?-recommends MRI ?-MRI Thoracic and Lumbar Spine showed "Well-circumscribed 3.0 cm lobulated T2 hyperintense, T1 hypointense,  on-enhancing mass in the right T12-L1 neural foramen with scalloping/remodeling of the adjacent right posterior T12 vertebral body. No surrounding marrow or soft tissue edema. 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 Mild multilevel thoracic and lumbar spondylosis as described above. No high-grade stenosis or impingement.  Chronic bilateral L5 pars defects with grade 1 anterolisthesis at L5-S1." ?-Will discuss with Neurosurgery and I spoke to Dr. Conchita Paris personally today who feels that she needs no further inpatient work-up and feels that she can be discharged safely to rehab and followed in the outpatient setting with neurosurgery for her CT scan findings ?-Pain control with pain scale we will also add Voltaren gel ?-Left Knee X-ray showed "Minimal medial and patellofemoral compartment osteoarthritis." ?-Continue to Monitor closely ?-Will obtain PT/OT to further evaluate and Treat and recommending SNF and after re-evaluation they are still recommending SNF so she will be discharged to [facility] given that she is medically stable ?

## 2021-10-14 NOTE — TOC Progression Note (Signed)
Transition of Care (TOC) - Progression Note  ? ? ?Patient Details  ?Name: Melanie Spencer ?MRN: [id number] ?Date of Birth: 01-17-1961 ? ?Transition of Care (TOC) CM/SW Contact  ?Salome Arnt, LCSW ?Phone Number: ?10/14/2021, 1:10 PM ? ?Clinical Narrative:   ?Transition of Care (TOC) Screening Note ? ? ?Patient Details  ?Name: Melanie Spencer ?Date of Birth: [birth year] ? ? ?Transition of Care (TOC) CM/SW Contact:    ?Salome Arnt, LCSW ?Phone Number: ?10/14/2021, 1:10 PM ? ? ? ?Transition of Care Department [facility]) has reviewed patient and no TOC needs have been identified at this time. We will continue to monitor patient advancement through interdisciplinary progression rounds. If new patient transition needs arise, please place a TOC consult. ?   ? ? ? ?  ?  ? ?Expected Discharge Plan and Services ?  ?  ?  ?  ?  ?                ?  ?  ?  ?  ?  ?  ?  ?  ?  ?  ? ? ?Social Determinants of Health (SDOH) Interventions ?  ? ?Readmission Risk Interventions ?   ? View : No data to display.  ?  ?  ?  ? ? ?

## 2021-10-14 NOTE — Assessment & Plan Note (Addendum)
Metabolic Acidosis ?Hyperkalemia, Improved ?-Baseline 1.07, her BUNs/creatinine on admission was 51/1.76; Now BUN/Cr is improved and at baseline at 12/0.80 ?-BUN also elevated, bicarb decreased ?-Patient had a metabolic acidosis with a CO2 of 15, chloride level of 112, and anion gap of 8; now CO2 is 28, chloride level is 106, anion gap is 8 ?-Avoid further nephrotoxic medications, contrast dyes, hypotension and renally dose medications ?-Given 1 L fluid given in the ED ?-Patient's potassium is 5.3 and will give a dose of Lokelma 10 g x1 ?-Have stopped her sodium bicarbonate fluids and pills. ?-Likely dehydrated secondary to GI losses ?-Continue monitor and trend renal function carefully and repeat CMP in the a.m. ?? ?

## 2021-10-14 NOTE — ED Notes (Signed)
Patient transported to CT 

## 2021-10-14 NOTE — Assessment & Plan Note (Addendum)
-  Initially was holding Protonix in the setting of AKI and her diarrhea but have now resumed ?

## 2021-10-14 NOTE — Progress Notes (Addendum)
Care started prior to midnight in the emergency room and patient was admitted early this morning after midnight by Dr. Victorino Dike and I am in current agreement with her assessment and plan.  Additional changes to the plan of care been made accordingly.  The patient is a 61 year old obese Caucasian female with a past medical history significant for but not limited to CVA, history of essential hypertension, hyperlipidemia, diabetes mellitus type 2 which is uncontrolled as well as other comorbidities who presented with a chief complaint of pain in the upper lateral aspect of her leg as well as complaints of diarrhea and not feeling well since the 28th of this month.  She states the pain started yesterday and upon further questioning in the ED she had some nausea and vomiting on Saturday and Sunday and had diarrhea several times a day for a week.  She denies any blood in her stool or ?Abdominal pain and states that her last normal meal was the night before but she stated she was hungry.  She denies any dysuria but admits to having increased frequency and urgency.  Also notes that her last normal bowel movement was about approximately 10 days ago.  She denied any chest pain, shortness of breath or cough but states that she had pain in the left upper leg that was sharp and vague in nature and worse with movement and has been constant.  She says the pain is intense and is a severe pain and she was given fentanyl in the ED which did not relieve the pain.  Of note she does not smoke or drink alcohol and she is not vaccinated for COVID-19 disease.  She is currently being admitted and treated for the following but not limited to: ? ?Acute Metabolic Encephalopathy ?-Secondary to suspected Urinary Tract infection ?-Described as being " foggy headed" ?-Multifactorial with pain medication also contributing ?-Patient getting confused during history taking, but easily reoriented ?-Likely to improve with treatment of  infections ?-Delirium Precautions ?  ?Diabetes mellitus type 2 in obese [facility]) ?-Holding Trulicity, Actos ?-Continue Gabapentin 100 mg p.o. daily for her diabetic neuropathy ?-Insulin 40 units daily, continue reduced dose at 30 units ?-Currently on full liquid diet and likely will increase to Carb Modified Diet ?-Continue to Monitor CBGs; CBG's ranging from 160-177 ?  ?Left leg pain ?-Patient reports excruciating pain in her left upper thigh ?Abnormal finding on CT calls possible tumor at the -T12/L1 -recommends MRI ?-MRI Thoracic and Lumbar Spine showed "Well-circumscribed 3.0 cm lobulated T2 hyperintense, T1 hypointense,  on-enhancing mass in the right T12-L1 neural foramen with scalloping/remodeling of the adjacent right posterior T12 vertebral body. No surrounding marrow or soft tissue edema. 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 Mild multilevel thoracic and lumbar spondylosis as described above. No high-grade stenosis or impingement.  Chronic bilateral L5 pars defects with grade 1 anterolisthesis at L5-S1." ?-Will discuss with Neurosurgery  ?-Pain control with pain scale ?-Continue to monitor ?-Will obtain PT/OT to further evaluate and Treat ?  ?UTI (urinary tract infection) ?-UA indicative of UTI as it showed a hazy appearance with 50 glucose, moderate leukocytes, negative nitrites, many bacteria, 0-5 squamous epithelial cells, 21 WBCs ?-Urine culture pending ?-Previous cultures have grown Klebsiella, E. coli, Citrobacter ?The most recent culture grew Klebsiella that was sensitive to Rocephin ?-Continue IV ceftriaxone ?-Continue monitor and follow cultures; unfortunately no blood cultures were sent ?  ?Hyperlipidemia ?-Continue simvastatin but may need to hold given her left leg pain and questionable myopathy ?-  Continue to monitor ?  ?AKI (acute kidney injury) (HCC) ?Metabolic Acidosis ?Hyperkalemia ?-Baseline 1.07, her BUNs/creatinine on admission was 51/1.76 ?-BUN also elevated, bicarb  decreased ?-Patient has a metabolic acidosis with a CO2 of 15, chloride level of 112, and anion gap of 8 ?-Avoid further nephrotoxic medications, contrast dyes, hypotension and renally dose medications ?-Given 1 L fluid given in the ED ?-Patient's potassium is 5.3 and will give a dose of Lokelma 10 g x1 ?-Continue with Sodium Bicarbonate 650 mg p.o. twice daily ?-Continue IV fluids with normal saline at 75 MLS per hour ?-Likely dehydrated secondary to GI losses ?-Continue monitor and trend renal function carefully and repeat CMP in the a.m. ?  ?Colitis ?-Proctocolitis mild on CT abdomen pelvis ?-Diarrhea for several days ?-Continue Rocephin and Flagyl ?-Patient did have a bowel movement at admission, send for GI pathogen panel and C. Difficile ?-Enteric precautions ?-Continue with normal saline at 75 MLS per hour ?-On a full liquid diet ? ?GERD (gastroesophageal reflux disease) ?-Holding Protonix in the setting of AKI and her diarrhea ?  ?Essential hypertension, benign ?-Holding ramipril in the setting of AKI ?-Continue Amlodipine 2.5 mg p.o. daily ?-Continue to monitor blood pressures per protocol ?-Last blood pressure reading was ? ?Obesity ?-Complicates overall prognosis and care ?-Estimated body mass index is 37.85 kg/m? as calculated from the following: ?  Height as of this encounter: 5' 6.5" (1.689 m). ?  Weight as of this encounter: 108 kg.  ?-Weight Loss and Dietary Counseling given ? ? ?We will continue to monitor patient's clinical response to intervention and repeat blood work and imaging in the a.m. ?

## 2021-10-14 NOTE — Plan of Care (Signed)
?  Problem: Acute Rehab OT Goals (only OT should resolve) ?Goal: Pt. Will Perform Grooming ?Flowsheets (Taken 10/14/2021 1454) ?Pt Will Perform Grooming: ? with supervision ? sitting ?Goal: Pt. Will Perform Upper Body Dressing ?Flowsheets (Taken 10/14/2021 1454) ?Pt Will Perform Upper Body Dressing: ? with modified independence ? sitting ?Goal: Pt. Will Perform Lower Body Dressing ?Flowsheets (Taken 10/14/2021 1454) ?Pt Will Perform Lower Body Dressing: ? with min assist ? sitting/lateral leans ? with adaptive equipment ?Goal: Pt. Will Transfer To Toilet ?Flowsheets (Taken 10/14/2021 1454) ?Pt Will Transfer to Toilet: ? with supervision ? bedside commode ? stand pivot transfer ?Goal: Pt. Will Perform Toileting-Clothing Manipulation ?Flowsheets (Taken 10/14/2021 1454) ?Pt Will Perform Toileting - Clothing Manipulation and hygiene: ? with min guard assist ? sitting/lateral leans ?Goal: Pt/Caregiver Will Perform Home Exercise Program ?Flowsheets (Taken 10/14/2021 1454) ?Pt/caregiver will Perform Home Exercise Program: ? Increased ROM ? Left upper extremity ? With minimal assist ? Nilza Eaker OT, MOT ? ?

## 2021-10-14 NOTE — Assessment & Plan Note (Addendum)
-  Proctocolitis mild on CT abdomen pelvis ?-Diarrhea for several days but is improving  ?-Continuing Rocephin and Flagyl but have changed to p.o. cefdinir ?-Patient did have a bowel movement at admission, send for GI pathogen panel and C. Difficile; BOTH C Diff and GI Pathogen Panel Negative ?-Enteric precautions now discontinued ?-IV fluid hydration has now been discontinued ?-Currently tolerating carb modified diet. ?-Continue p.o. antibiotics for total 10 days ?? ?

## 2021-10-14 NOTE — ED Notes (Signed)
Husband took pt to bathroom ?Ua not obtained , as staff not in room ?

## 2021-10-14 NOTE — Assessment & Plan Note (Addendum)
-  Holding Trulicity, Actos ?-Continue Gabapentin 100 mg p.o. daily for her diabetic neuropathy ?-Insulin 40 units daily,?continue reduced dose at 30 units ?-Now on a carb modified diet ?-Continue to Monitor CBGs; CBG's ranging from 126-201 ?-Hemoglobin A1c was 7.8 ?

## 2021-10-14 NOTE — ED Provider Notes (Signed)
? ? EMERGENCY DEPARTMENT  ?Provider Note ? ?CSN: [id number] ?Arrival date & time: 10/13/21 2154 ? ?History ?Chief Complaint  ?Patient presents with  ? Diarrhea  ? ? ?Melanie Spencer is a 61 y.o. female with history of DM, stroke, lives with husband who provides much of the history. She has had about a week of intermittent vomiting and diarrhea. Mostly diarrhea now. Poor PO intake and then today has been more weak and sometimes confused. She was at her PCP office yesterday and was given instructions on OTC diarrhea meds. She denies any pain, fever, blood in stool. ? ? ?Home Medications ?Prior to Admission medications   ?Medication Sig Start Date End Date Taking? Authorizing Provider  ?amLODipine (NORVASC) 2.5 MG tablet Take 1 tablet by mouth daily. 07/03/21   [provider]  ?aspirin 325 MG tablet Take by mouth.    [provider]  ?cholecalciferol (VITAMIN D) 1000 UNITS tablet Take 1,000 Units by mouth daily.     [provider]  ?Dulaglutide (TRULICITY) 4.5 MG/0.5ML SOPN Inject 4.5 mg as directed once a week. 03/11/21   Elenore Paddy, NP  ?fesoterodine (TOVIAZ) 4 MG TB24 tablet TAKE 1 TABLET(4 MG) BY MOUTH DAILY ?Patient not taking: Reported on 07/13/2021 03/23/21   Elenore Paddy, NP  ?fluticasone [facility]) 50 MCG/ACT nasal spray SHAKE LIQUID AND USE 2 SPRAYS IN [facility] NOSTRIL EVERY DAY 04/17/21 07/13/21  Elenore Paddy, NP  ?gabapentin (NEURONTIN) 300 MG capsule Take by mouth. 07/03/21   [provider]  ?glucose blood (ACCU-CHEK GUIDE) test strip TEST FOUR TIMES A DAY 12/27/20   Lilly Cove C, MD  ?HYDROcodone-acetaminophen (NORCO/VICODIN) 5-325 MG tablet Take 1 tablet by mouth every 6 (six) hours as needed for moderate pain. 04/29/21   Oliver Barre, MD  ?Insulin Pen Needle 29G X 12.7MM MISC 1 Units by Does not apply route daily at 12 noon. 12/02/20   [name], MD  ?LANTUS SOLOSTAR 100 UNIT/ML Solostar Pen ADMINISTER 40 UNITS UNDER THE SKIN AT BEDTIME 05/18/21    Oliver Barre, MD  ?metFORMIN (GLUCOPHAGE) 1000 MG tablet Take 1 tablet (1,000 mg total) by mouth 2 (two) times daily. 03/19/21 07/13/21  Elenore Paddy, NP  ?omeprazole (PRILOSEC) 20 MG capsule Take 1 capsule by mouth daily. 07/03/21   [provider]  ?pioglitazone (ACTOS) 15 MG tablet Take 1 tablet by mouth daily. 07/03/21 07/03/22  [provider]  ?ramipril (ALTACE) 10 MG capsule TAKE 2 CAPSULES(20 MG) BY MOUTH DAILY 01/03/21   Lilly Cove C, MD  ?simvastatin (ZOCOR) 40 MG tablet Take 1 tablet by mouth at bedtime. 07/03/21   [provider]  ? ? ? ?Allergies    ?Patient has no known allergies. ? ? ?Review of Systems   ?Review of Systems ?Please see HPI for pertinent positives and negatives ? ?Physical Exam ?BP (!) 132/51 (BP Location: Right Arm)   Pulse 95   Temp 97.9 ?F (36.6 ?C) (Oral)   Resp 14   Ht 5\' 6"  (1.676 m)   Wt 111.1 kg   LMP 03/02/2012   SpO2 98%   BMI 39.54 kg/m?  ? ?Physical Exam ?Vitals and nursing note reviewed.  ?Constitutional:   ?   Appearance: Normal appearance.  ?HENT:  ?   Head: Normocephalic and atraumatic.  ?   Nose: Nose normal.  ?   Mouth/Throat:  ?   Mouth: Mucous membranes are dry.  ?Eyes:  ?   Extraocular Movements: Extraocular movements intact.  ?  Conjunctiva/sclera: Conjunctivae normal.  ?Cardiovascular:  ?   Rate and Rhythm: Normal rate.  ?Pulmonary:  ?   Effort: Pulmonary effort is normal.  ?   Breath sounds: Normal breath sounds.  ?Abdominal:  ?   General: Abdomen is flat.  ?   Palpations: Abdomen is soft.  ?   Tenderness: There is no abdominal tenderness. There is no guarding.  ?Musculoskeletal:     ?   General: No swelling. Normal range of motion.  ?   Cervical back: Neck supple.  ?Skin: ?   General: Skin is warm and dry.  ?Neurological:  ?   Mental Status: She is alert.  ?   Comments: L sided hemiparesis is chronic  ?Psychiatric:     ?   Mood and Affect: Mood normal.  ? ? ?ED Results / Procedures / Treatments    ?EKG ?None ? ?Procedures ?Procedures ? ?Medications Ordered in the ED ?Medications  ?cefTRIAXone (ROCEPHIN) 1 g in sodium chloride 0.9 % 100 mL IVPB (1 g Intravenous New Bag/Given 10/14/21 0407)  ?metroNIDAZOLE (FLAGYL) IVPB 500 mg (has no administration in time range)  ?lactated ringers bolus 1,000 mL (0 mLs Intravenous Stopped 10/14/21 0350)  ?iohexol (OMNIPAQUE) 300 MG/ML solution 100 mL (75 mLs Intravenous Contrast Given 10/14/21 0207)  ?fentaNYL (SUBLIMAZE) injection 50 mcg (50 mcg Intravenous Given 10/14/21 0412)  ? ? ?Initial Impression and Plan ? Patient with persistent diarrhea, some nausea but last vomit was 2 days ago. She had labs done from triage, CBC had normal WBC. CMP with AKI worsened from baseline 0.8. Otherwise no significant elyte abnormalities. Given persistent symptoms, will send for CT to rule out signs of infectious colitis. Begin IVF and awaiting UA.  ? ?ED Course  ? ?Clinical Course as of 10/14/21 0433  ?Wed Oct 14, 2021  ?0313 I personally viewed the images from radiology studies and agree with radiologist interpretation: CT consistent with colitis, unsure of etiology for air in bladder. Awaiting UA.  ? [CS]  ?4481 UA is consistent with UTI. Given her mild confusion and AKI, will discuss admission with Hospitalist. Abx ordered.  [CS]  ?8563 Spoke with Dr. Carren Rang, Hospitalist, who will evaluate for admission.  [CS]  ?  ?Clinical Course User Index ?[CS] Pollyann Savoy, MD  ? ? ? ?MDM Rules/Calculators/A&P ?Medical Decision Making ?Problems Addressed: ?Acute cystitis without hematuria: acute illness or injury ?Colitis: acute illness or injury ?Metabolic encephalopathy: acute illness or injury ? ?Amount and/or Complexity of Data Reviewed ?Labs: ordered. Decision-making details documented in ED Course. ?Radiology: ordered and independent interpretation performed. Decision-making details documented in ED Course. ? ?Risk ?Prescription drug management. ?Decision regarding  hospitalization. ? ? ? ?Final Clinical Impression(s) / ED Diagnoses ?Final diagnoses:  ?Colitis  ?Acute cystitis without hematuria  ?Metabolic encephalopathy  ? ? ?Rx / DC Orders ?ED Discharge Orders   ? ? None  ? ?  ? ?  ?Pollyann Savoy, MD ?10/14/21 [phone] ? ?

## 2021-10-14 NOTE — Plan of Care (Signed)
?  Problem: Acute Rehab PT Goals(only PT should resolve) ?Goal: Pt Will Go Supine/Side To Sit ?Outcome: Progressing ?Flowsheets (Taken 10/14/2021 1505) ?Pt will go Supine/Side to Sit: ? with minimal assist ? with min guard assist ?Goal: Pt Will Go Sit To Supine/Side ?Outcome: Progressing ?Flowsheets (Taken 10/14/2021 1505) ?Pt will go Sit to Supine/Side: ? with minimal assist ? with min guard assist ?Goal: Patient Will Transfer Sit To/From Stand ?Outcome: Progressing ?Flowsheets (Taken 10/14/2021 1505) ?Patient will transfer sit to/from stand: with min guard assist ?Goal: Pt Will Transfer Bed To Chair/Chair To Bed ?Outcome: Progressing ?Flowsheets (Taken 10/14/2021 1505) ?Pt will Transfer Bed to Chair/Chair to Bed: ? with min assist ? min guard assist ?Goal: Pt Will Ambulate ?Outcome: Progressing ?Flowsheets (Taken 10/14/2021 1505) ?Pt will Ambulate: ? 15 feet ? with least restrictive assistive device ? with minimal assist ?Goal: Pt/caregiver will Perform Home Exercise Program ?Outcome: Progressing ?Flowsheets (Taken 10/14/2021 1505) ?Pt/caregiver will Perform Home Exercise Program: ? For increased strengthening ? For improved balance ? With Supervision, verbal cues required/provided ? 3:06 PM, 10/14/21 ?Wyman Songster PT, DPT ?Physical Therapist at [facility] ?[facility] ? ?

## 2021-10-14 NOTE — Evaluation (Signed)
Physical Therapy Evaluation ?Patient Details ?Name: Melanie Spencer ?MRN: [id number] ?DOB: 10-22-1960 ?Today's Date: 10/14/2021 ? ?History of Present Illness ? Melanie Spencer is a 61 y.o. female with medical history significant of CVA, essential hypertension, hyperlipidemia, morbid obesity, type 2 diabetes mellitus uncontrolled, and more presents ED with a chief complaint of diarrhea.  Patient reports that she has not been feeling well since the 20th.  At the time of my exam her main complaint is pain in the upper lateral aspect of her left thigh.  This pain just started tonight though.  On further questioning she reports she has had nausea and vomiting on Saturday and Sunday mornings.  She had diarrhea several times a day for a week.  There is been no blood in her stool.  She denies abdominal pain.  Her last normal meal was last night, and she is hungry.  Her last normal bowel movement was 10 days ago.  She denies dysuria, but admits to frequency and urgency.  This started on the 20th.  Patient denies any fevers.  She denies cough, shortness of breath, chest pain.  She describes the pain in her left upper leg is a sharp pain vague location.  It is worse with movement, and has been constant.  Patient reports it is an intense, severe pain.  She was given fentanyl in the ED and this did not relieve the pain.  Patient has no other complaints at this time. ?  ?Clinical Impression ? Patient limited for functional mobility as stated below secondary to BLE weakness, fatigue and poor standing balance. Requires assist with all mobility secondary to strength and balance deficits. LLE buckling with attempted ambulation due to hx CVA but patient able to complete lateral stepping at bedside to/from [name] with HHA or use of RW with RUE. Husband present and assisting patient throughout session also. Patient will benefit from continued physical therapy in hospital and recommended venue below to increase strength, balance, endurance for safe  ADLs and gait. ?   ?   ? ?Recommendations for follow up therapy are one component of a multi-disciplinary discharge planning process, led by the attending physician.  Recommendations may be updated based on patient status, additional functional criteria and insurance authorization. ? ?Follow Up Recommendations Skilled nursing-short term rehab (<3 hours/day) ? ?  ?Assistance Recommended at Discharge Frequent or constant Supervision/Assistance  ?Patient can return home with the following ? A lot of help with walking and/or transfers;A lot of help with bathing/dressing/bathroom;Assistance with cooking/housework;Help with stairs or ramp for entrance;Assist for transportation ? ?  ?Equipment Recommendations None recommended by PT  ?Recommendations for Other Services ?    ?  ?Functional Status Assessment Patient has had a recent decline in their functional status and demonstrates the ability to make significant improvements in function in a reasonable and predictable amount of time.  ? ?  ?Precautions / Restrictions Precautions ?Precautions: Fall ?Restrictions ?Weight Bearing Restrictions: No ?Other Position/Activity Restrictions: Pt has L AFO  ? ?  ? ?Mobility ? Bed Mobility ?Overal bed mobility: Needs Assistance ?Bed Mobility: Supine to Sit, Sit to Supine ?  ?  ?Supine to sit: Mod assist ?Sit to supine: Mod assist ?  ?General bed mobility comments: Slow labored movement; husband assisting to lift pt B LE into bed. ?  ? ?Transfers ?Overall transfer level: Needs assistance ?  ?Transfers: Sit to/from Stand, Bed to chair/wheelchair/BSC ?Sit to Stand: Min assist ?  ?Step pivot transfers: Min assist, Mod assist ?  ?  ?  ?  General transfer comment: Slow labored movement with frequent verbal and tactile cuing with use of RW and hand held assist.; apprehensive before raising bed ?  ? ?Ambulation/Gait ?Ambulation/Gait assistance: Min assist ?Gait Distance (Feet): 4 Feet ?Assistive device: Rolling walker (2 wheels) (with RUE) ?Gait  Pattern/deviations: Shuffle ?Gait velocity: decreased ?  ?  ?General Gait Details: completes several lateral steps at bedside, attempted forward ambualtion with L knee buckling, frequent cueing required for lateral steps to BSC/bed ? ?Stairs ?  ?  ?  ?  ?  ? ?Wheelchair Mobility ?  ? ?Modified Rankin (Stroke Patients Only) ?  ? ?  ? ?Balance Overall balance assessment: Needs assistance ?Sitting-balance support: No upper extremity supported, Feet supported ?Sitting balance-Leahy Scale: Fair ?Sitting balance - Comments: fair to good seated EOB ?  ?Standing balance support: Single extremity supported, During functional activity ?Standing balance-Leahy Scale: Poor ?Standing balance comment: poor with single hand held assist on RW or by hand ?  ?  ?  ?  ?  ?  ?  ?  ?  ?  ?  ?   ? ? ? ?Pertinent Vitals/Pain Pain Assessment ?Pain Score: 6  ?Pain Location: L side of head; L leg ?Pain Descriptors / Indicators: Aching ?Pain Intervention(s): Limited activity within patient's tolerance, Monitored during session  ? ? ?Home Living Family/patient expects to be discharged to:: Private residence ?Living Arrangements: Spouse/significant other ?Available Help at Discharge: Family;Available PRN/intermittently ?Type of Home: House (double wide) ?Home Access: Stairs to enter ?Entrance Stairs-Rails: Can reach both;Right;Left ?Entrance Stairs-Number of Steps: 3 to 4 ?  ?Home Layout: One level ?Home Equipment: Wheelchair - manual;Cane - single point;Shower seat - built in;BSC/3in1 ?Additional Comments: Pt recently has been using the [facility] more frequently.  ?  ?Prior Function Prior Level of Function : Needs assist ?  ?  ?  ?Physical Assist : Mobility (physical);ADLs (physical) ?Mobility (physical): Bed mobility;Transfers;Gait;Stairs ?ADLs (physical): IADLs;Dressing ?Mobility Comments: Pt reportely is assisted for bed obility PRN and for community ambulation via single hand held assist or use of cart. Household pt uses SPC. ?ADLs Comments: Pt  is moslty indpendent for ADL's with reported assist for donning socks or pants that are not elastic waist. Pt is assisted for IADL's but will participate in them as well. ?  ? ? ?Hand Dominance  ? Dominant Hand: Right ? ?  ?Extremity/Trunk Assessment  ? Upper Extremity Assessment ?Upper Extremity Assessment: Defer to OT evaluation ?RUE Deficits / Details: Gnerally weak but WFL A/ROM. ?LUE Deficits / Details: Pt has history of stroke and reported breaking shoulder a couple months ago. Limited at baseline. Today pt demonstrated trace movement at shoulder and of grip. No other movement noted with minimal tone in elbow and limited shoulder P/ROM to less than 50% for flexion and abduction. ?LUE Coordination: decreased fine motor;decreased gross motor ?  ? ?Lower Extremity Assessment ?Lower Extremity Assessment: Generalized weakness ?  ? ?Cervical / Trunk Assessment ?Cervical / Trunk Assessment: Normal  ?Communication  ? Communication: No difficulties  ?Cognition Arousal/Alertness: Awake/alert ?Behavior During Therapy: [facility] for tasks assessed/performed ?Overall Cognitive Status: Within Functional Limits for tasks assessed ?  ?  ?  ?  ?  ?  ?  ?  ?  ?  ?  ?  ?  ?  ?  ?  ?  ?  ?  ? ?  ?General Comments   ? ?  ?Exercises    ? ?Assessment/Plan  ?  ?PT Assessment Patient needs continued PT services  ?  PT Problem List Decreased mobility;Decreased strength;Decreased activity tolerance;Decreased balance ? ?   ?  ?PT Treatment Interventions DME instruction;Therapeutic exercise;Gait training;Balance training;Stair training;Neuromuscular re-education;Patient/family education;Therapeutic activities;Functional mobility training   ? ?PT Goals (Current goals can be found in the Care Plan section)  ?Acute Rehab PT Goals ?Patient Stated Goal: Return home ?PT Goal Formulation: With patient ?Time For Goal Achievement: 10/28/21 ?Potential to Achieve Goals: Fair ? ?  ?Frequency Min 3X/week ?  ? ? ?Co-evaluation PT/OT/SLP  Co-Evaluation/Treatment: Yes ?Reason for Co-Treatment: To address functional/ADL transfers ?PT goals addressed during session: Mobility/safety with mobility;Balance;Proper use of DME;Strengthening/ROM ?OT goals addressed d

## 2021-10-14 NOTE — H&P (Signed)
?History and Physical  ? ? ?Patient: Melanie Spencer HQI:[id number] DOB: 1961-05-31 ?DOA: 10/13/2021 ?DOS: the patient was seen and examined on 10/14/2021 ?PCP: Beatrix FettersKotturi, Vinay K, MD  ?Patient coming from: Home ? ?Chief Complaint:  ?Chief Complaint  ?Patient presents with  ? Diarrhea  ? ?HPI: Melanie Spencer is a 61 y.o. female with medical history significant of CVA, essential hypertension, hyperlipidemia, morbid obesity, type 2 diabetes mellitus uncontrolled, and more presents ED with a chief complaint of diarrhea.  Patient reports that she has not been feeling well since the 20th.  At the time of my exam her main complaint is pain in the upper lateral aspect of her left thigh.  This pain just started tonight though.  On further questioning she reports she has had nausea and vomiting on Saturday and Sunday mornings.  She had diarrhea several times a day for a week.  There is been no blood in her stool.  She denies abdominal pain.  Her last normal meal was last night, and she is hungry.  Her last normal bowel movement was 10 days ago.  She denies dysuria, but admits to frequency and urgency.  This started on the 20th.  Patient denies any fevers.  She denies cough, shortness of breath, chest pain.  She describes the pain in her left upper leg is a sharp pain vague location.  It is worse with movement, and has been constant.  Patient reports it is an intense, severe pain.  She was given fentanyl in the ED and this did not relieve the pain.  Patient has no other complaints at this time. ? ?Patient does not smoke, does not drink alcohol.  She is not vaccinated for COVID.  Patient is full code. ?Review of Systems: As mentioned in the history of present illness. All other systems reviewed and are negative. ?Past Medical History:  ?Diagnosis Date  ? Cerebrovascular disease 05/23/2019  ? Essential hypertension, benign 05/23/2019  ? History of stroke 2007  ? left leg weakness  ? HLD (hyperlipidemia) 05/23/2019  ? Morbid obesity  (HCC) 05/23/2019  ? Type II diabetes mellitus, uncontrolled 05/23/2019  ? Vitamin D deficiency   ? ?Past Surgical History:  ?Procedure Laterality Date  ? AMPUTATION Right 11/09/2019  ? Procedure: 5TH AMPUTATION RAY;  Surgeon: Vivi BarrackWagoner, Matthew R, DPM;  [facility];  Service: Podiatry;  Laterality: Right;  ? CHOLECYSTECTOMY    ? COLONOSCOPY N/A 11/12/2020  ? Procedure: COLONOSCOPY;  Surgeon: Corbin Adeourk, Robert M, MD;  Location: [facility] SUITE;  Service: Endoscopy;  Laterality: N/A;  ASA III / PM procedure  ? POLYPECTOMY  11/12/2020  ? Procedure: POLYPECTOMY;  Surgeon: Corbin Adeourk, Robert M, MD;  Location: [facility] SUITE;  Service: Endoscopy;;  ? ?Social History:  reports that she quit smoking about 16 years ago. Her smoking use included cigarettes. She has a 40.00 pack-year smoking history. She has never used smokeless tobacco. She reports that she does not drink alcohol and does not use drugs. ? ?No Known Allergies ? ?Family History  ?Problem Relation Age of Onset  ? Heart disease Mother   ? Diabetes Mother   ? Hypertension Mother   ? Cancer Father   ?     Lung  ? Hypertension Sister   ? Macular degeneration Sister   ? Appendicitis Brother   ? Hypertension Sister   ? Diabetes Sister   ? ? ?Prior to Admission medications   ?Medication Sig Start Date End Date Taking? Authorizing Provider  ?amLODipine (NORVASC)  2.5 MG tablet Take 1 tablet by mouth daily. 07/03/21   [provider]  ?aspirin 325 MG tablet Take by mouth.    [provider]  ?cholecalciferol (VITAMIN D) 1000 UNITS tablet Take 1,000 Units by mouth daily.     [provider]  ?Dulaglutide (TRULICITY) 4.5 MG/0.5ML SOPN Inject 4.5 mg as directed once a week. 03/11/21   Elenore Paddy, NP  ?fesoterodine (TOVIAZ) 4 MG TB24 tablet TAKE 1 TABLET(4 MG) BY MOUTH DAILY ?Patient not taking: Reported on 07/13/2021 03/23/21   Elenore Paddy, NP  ?fluticasone [facility]) 50 MCG/ACT nasal spray SHAKE LIQUID AND USE 2 SPRAYS IN [facility] NOSTRIL EVERY DAY 04/17/21  07/13/21  Elenore Paddy, NP  ?gabapentin (NEURONTIN) 300 MG capsule Take by mouth. 07/03/21   [provider]  ?glucose blood (ACCU-CHEK GUIDE) test strip TEST FOUR TIMES A DAY 12/27/20   Lilly Cove C, MD  ?HYDROcodone-acetaminophen (NORCO/VICODIN) 5-325 MG tablet Take 1 tablet by mouth every 6 (six) hours as needed for moderate pain. 04/29/21   Oliver Barre, MD  ?Insulin Pen Needle 29G X 12.7MM MISC 1 Units by Does not apply route daily at 12 noon. 12/02/20   [name], MD  ?LANTUS SOLOSTAR 100 UNIT/ML Solostar Pen ADMINISTER 40 UNITS UNDER THE SKIN AT BEDTIME 05/18/21   Oliver Barre, MD  ?metFORMIN (GLUCOPHAGE) 1000 MG tablet Take 1 tablet (1,000 mg total) by mouth 2 (two) times daily. 03/19/21 07/13/21  Elenore Paddy, NP  ?omeprazole (PRILOSEC) 20 MG capsule Take 1 capsule by mouth daily. 07/03/21   [provider]  ?pioglitazone (ACTOS) 15 MG tablet Take 1 tablet by mouth daily. 07/03/21 07/03/22  [provider]  ?ramipril (ALTACE) 10 MG capsule TAKE 2 CAPSULES(20 MG) BY MOUTH DAILY 01/03/21   Lilly Cove C, MD  ?simvastatin (ZOCOR) 40 MG tablet Take 1 tablet by mouth at bedtime. 07/03/21   [provider]  ? ? ?Physical Exam: ?Vitals:  ? 10/13/21 2330 10/14/21 0100 10/14/21 0411 10/14/21 0548  ?BP:  (!) 156/99 (!) 132/51 (!) 152/65  ?Pulse: 92 88 95 97  ?Resp: 19 18 14 14   ?Temp:    97.6 ?F (36.4 ?C)  ?TempSrc:    Oral  ?SpO2: 98% 98% 98% 99%  ?Weight:      ?Height:      ? ?1.  General: ?Patient lying supine in bed, repositioning frequently and rubbing her left leg ?  ?2. Psychiatric: ?Alert and oriented x 3, mood and behavior normal for situation, pleasant and cooperative with exam ?  ?3. Neurologic: ?Speech and language are normal, face is symmetric, chronic left-sided weakness from previous stroke, hypersensitivity to left arm, at baseline without acute deficits on limited exam ?  ?4. HEENMT:  ?Head is atraumatic, normocephalic, pupils reactive to light, neck is  supple, trachea is midline, mucous membranes are moist ?  ?5. Respiratory : ?Lungs are clear to auscultation bilaterally without wheezing, rhonchi, rales, no cyanosis, no increase in work of breathing or accessory muscle use ?  ?6. Cardiovascular : ?Heart rate normal, rhythm is regular, no murmurs, rubs or gallops, no peripheral edema, peripheral pulses palpated ?  ?7. Gastrointestinal:  ?Abdomen is obese, soft, nondistended, nontender to palpation bowel sounds active, no masses or organomegaly palpated ?  ?8. Skin:  ?Skin is warm, dry and intact without rashes, acute lesions, or ulcers on limited exam ?  ?9.Musculoskeletal:  ?No acute deformities or trauma, chronic contractures of left hand, no new asymmetry  in tone, no peripheral edema, peripheral pulses palpated, no tenderness to palpation in the extremities ? 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 ?Diabetes mellitus type 2 in obese [facility]) ?Holding Trulicity, Actos ?Continue gabapentin ?Insulin 40 units daily, continue reduced dose at 30 units ?Currently on full liquid diet ?Monitor CBGs ? ?Left leg pain ?Patient reports excruciating pain in her left upper thigh ?Abnormal finding on CT calls possible  tumor at the T12/L1 -recommends MRI ?My thoracic and lumbar spine ?Pain control with pain scale ?Continue to monitor ? ?UTI (urinary tract infection) ?UA indicative of UTI ?Urine culture pending ?Previous cultu

## 2021-10-14 NOTE — Evaluation (Signed)
Occupational Therapy Evaluation ?Patient Details ?Name: Melanie Spencer Bickle ?MRN: [id number] ?DOB: 13-May-1961 ?Today's Date: 10/14/2021 ? ? ?History of Present Illness Melanie Spencer Heuberger is a 61 y.o. female with medical history significant of CVA, essential hypertension, hyperlipidemia, morbid obesity, type 2 diabetes mellitus uncontrolled, and more presents ED with a chief complaint of diarrhea.  Patient reports that she has not been feeling well since the 20th.  At the time of my exam her main complaint is pain in the upper lateral aspect of her left thigh.  This pain just started tonight though.  On further questioning she reports she has had nausea and vomiting on Saturday and Sunday mornings.  She had diarrhea several times a day for a week.  There is been no blood in her stool.  She denies abdominal pain.  Her last normal meal was last night, and she is hungry.  Her last normal bowel movement was 10 days ago.  She denies dysuria, but admits to frequency and urgency.  This started on the 20th.  Patient denies any fevers.  She denies cough, shortness of breath, chest pain.  She describes the pain in her left upper leg is a sharp pain vague location.  It is worse with movement, and has been constant.  Patient reports it is an intense, severe pain.  She was given fentanyl in the ED and this did not relieve the pain.  Patient has no other complaints at this time. (per MD report)  ? ?Clinical Impression ?  ?Pt agreeable to OT and PT co-evaluation. Husband present during session and assisting with bed mobility. Pt is minimally assisted at baseline for lower body dressing and to boost from bed PRN. Pt presents with increased weakness and need for min A to stand from bed and min to mod A [facility]. L UE is very limited at baseline with Spencer UE generally weak. Pt requires increased assist at this time with poor balance in standing with need of hand held assist. Pt will benefit from continued OT in the hospital and recommended  venue below to increase strength, balance, and endurance for safe ADL's.  ? ? ?   ? ?Recommendations for follow up therapy are one component of a multi-disciplinary discharge planning process, led by the attending physician.  Recommendations may be updated based on patient status, additional functional criteria and insurance authorization.  ? ?Follow Up Recommendations ? Skilled nursing-short term rehab (<3 hours/day)  ?  ?Assistance Recommended at Discharge Intermittent Supervision/Assistance  ?Patient can return home with the following A lot of help with walking and/or transfers;A lot of help with bathing/dressing/bathroom;Assistance with cooking/housework;Assist for transportation;Help with stairs or ramp for entrance ? ?  ?Functional Status Assessment ? Patient has had a recent decline in their functional status and demonstrates the ability to make significant improvements in function in a reasonable and predictable amount of time.  ?Equipment Recommendations ? None recommended by OT  ?  ?Recommendations for Other Services   ? ? ?  ?Precautions / Restrictions Precautions ?Precautions: Fall ?Restrictions ?Weight Bearing Restrictions: No ?Other Position/Activity Restrictions: Pt has L AFO  ? ?  ? ?Mobility Bed Mobility ?Overal bed mobility: Needs Assistance ?Bed Mobility: Supine to Sit, Sit to Supine ?  ?  ?Supine to sit: Mod assist ?Sit to supine: Mod assist ?  ?General bed mobility comments: Slow labored movement; husband assisting to lift pt B LE into bed. ?  ? ?Transfers ?Overall transfer level: Needs assistance ?  ?Transfers: Sit  to/from Stand, Bed to chair/wheelchair/BSC ?Sit to Stand: Min assist ?  ?  ?Step pivot transfers: Min assist, Mod assist ?  ?  ?General transfer comment: Slow labored movement with frequent verbal and tactile cuing with use of RW and hand held assist. ?  ? ?  ?Balance Overall balance assessment: Needs assistance ?Sitting-balance support: No upper extremity supported, Feet  supported ?Sitting balance-Leahy Scale: Fair ?Sitting balance - Comments: fair to good seated EOB ?  ?Standing balance support: Single extremity supported, During functional activity ?Standing balance-Leahy Scale: Poor ?Standing balance comment: poor with single hand held assist on RW or by hand ?  ?  ?  ?  ?  ?  ?  ?  ?  ?  ?  ?   ? ?ADL either performed or assessed with clinical judgement  ? ?ADL Overall ADL's : Needs assistance/impaired ?Eating/Feeding: Modified independent;Sitting ?  ?Grooming: Sitting;Min guard ?  ?  ?  ?Lower Body Bathing: Minimal assistance;Sitting/lateral leans;Moderate assistance ?  ?Upper Body Dressing : Min guard;Sitting ?  ?Lower Body Dressing: Moderate assistance;Maximal assistance;Bed level ?  ?Toilet Transfer: Minimal assistance;Moderate assistance;BSC/3in1;Stand-pivot ?  ?Toileting- Clothing Manipulation and Hygiene: Minimal assistance;Sitting/lateral lean;Min guard ?  ?  ?  ?Functional mobility during ADLs: Moderate assistance;Rolling walker (2 wheels) ?General ADL Comments: Pt presents with weakness and unsteady balance in standing. Pt assisted for lower body donning of socks at baseline.  ? ? ? 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 Able to track a stimulus with cuing to remain attentive.  ?   ?   ?  ?   ?  ? ?Pertinent Vitals/Pain Pain Assessment ?Pain Assessment: 0-10 ?Pain Score: 6  ?Pain Location: L side of head; L leg ?Pain Descriptors / Indicators: Aching ?Pain Intervention(s): Limited activity within patient's tolerance, Monitored during session, Repositioned  ? ? ? ?Hand Dominance Right ?  ?Extremity/Trunk Assessment Upper Extremity Assessment ?Upper Extremity Assessment: RUE deficits/detail;LUE deficits/detail ?RUE Deficits / Details:  Gnerally weak but WFL A/ROM. ?LUE Deficits / Details: Pt has history of stroke and reported breaking shoulder a couple months ago. Limited at baseline. Today pt demonstrated trace movement at shoulder and of grip. No other movement noted with minimal tone in elbow and limited shoulder P/ROM to less than 50% for flexion and abduction. ?LUE Coordination: decreased fine motor;decreased gross motor ?  ?Lower Extremity Assessment ?Lower Extremity Assessment: Defer to PT evaluation ?  ?Cervical / Trunk Assessment ?Cervical / Trunk Assessment: Normal ?  ?Communication Communication ?Communication: No difficulties ?  ?Cognition Arousal/Alertness: Awake/alert ?Behavior During Therapy: [facility] for tasks assessed/performed ?Overall Cognitive Status: Within Functional Limits for tasks assessed ?  ?  ?  ?  ?  ?  ?  ?  ?  ?  ?  ?  ?  ?  ?  ?  ?  ?  ?  ?   ? ?  ?   ?  ?    ? ? ?Home Living Family/patient expects to be discharged to:: Private residence ?Living Arrangements: Spouse/significant other ?Available Help at Discharge: Family;Available PRN/intermittently ?Type of Home: House (double wide) ?Home Access: Stairs to enter ?Entrance Stairs-Number of Steps: 3 to 4 ?Entrance Stairs-Rails: Can reach both;Right;Left ?Home Layout: One level ?  ?  ?Bathroom Shower/Tub: Psychologist, counselling;Tub/shower unit ?  ?Bathroom  Toilet: Handicapped height ?  ?  ?Home Equipment: Wheelchair - manual;Cane - single point;Shower seat - built in;BSC/3in1 ?  ?Additional Comments: Pt recently has been using the [facility] more frequently. ?  ? ?  ?Prior Functioning/Environment Prior Level of Function : Needs assist ?  ?  ?  ?Physical Assist : Mobility (physical);ADLs (physical) ?Mobility (physical): Bed mobility;Transfers;Gait;Stairs ?ADLs (physical): IADLs;Dressing ?Mobility Comments: Pt reportely is assisted for bed obility PRN and for community ambulation via single hand held assist or use of cart. Household pt uses SPC. ?ADLs Comments: Pt is moslty indpendent for  ADL's with reported assist for donning socks or pants that are not elastic waist. Pt is assisted for IADL's but will participate in them as well. ?  ? ?  ?  ?OT Problem List: Decreased strength;Decreased range of motion

## 2021-10-14 NOTE — Assessment & Plan Note (Addendum)
-  UA indicative of UTI as it showed a hazy appearance with 50 glucose, moderate leukocytes, negative nitrites, many bacteria, 0-5 squamous epithelial cells, 21 WBCs ?-Urine culture showing >70,000 CFU of GNR and turned out to be Klebsiella Pneumoniae that was only resistant to Ampicillin ?-Previous cultures have grown Klebsiella, E. coli, Citrobacter ?The most recent culture grew Klebsiella that was sensitive to Rocephin ?-Continued IV ceftriaxone and change to po Cefdinir 300 mg po q12h and will continue antibiotic course but because she also has a colitis noted we will continue antibiotics for 10 days total with the addition of probiotics ?-Continue monitor and follow cultures; unfortunately no blood cultures were sent on admission  ?

## 2021-10-14 NOTE — Assessment & Plan Note (Addendum)
-  Secondary to suspected Urinary Tract infection ?-Described as being?"?foggy headed" ?-Multifactorial with pain medication also contributing ?-Patient getting confused during history taking, but easily reoriented ?-Likely to improve with treatment of infections and she is improving slowly ?-Delirium Precautions ?-Had some intermittent confusion but is much improved since admission ?

## 2021-10-14 NOTE — Assessment & Plan Note (Addendum)
-  Holding ramipril in the setting of AKI ?-Continue Amlodipine 2.5 mg p.o. daily ?-Continue to monitor blood pressures per protocol ?-Last blood pressure reading was elevated at 168/87 ?? ?

## 2021-10-14 NOTE — TOC Initial Note (Signed)
Transition of Care (TOC) - Initial/Assessment Note  ? ? ?Patient Details  ?Name: Melanie Spencer ?MRN: [id number] ?Date of Birth: 01-02-1961 ? ?Transition of Care (TOC) CM/SW Contact:    ?Salome Arnt, LCSW ?Phone Number: ?10/14/2021, 3:32 PM ? ?Clinical Narrative:  Pt admitted due to acute metabolic encephalopathy. Met with pt and husband at bedside. Pt reports her husband provides some assistance with ADLs at baseline and she ambulates with a cane. The past few days she has had more weakness. PT evaluated pt and recommend SNF. LCSW briefly discussed SNF, but pt and husband report PT is supposed to return tomorrow to see if she has improved any and they want to wait until then to make decision on SNF or home health. TOC will follow up tomorrow.                 ? ? ?Expected Discharge Plan: [place] ?Barriers to Discharge: Continued Medical Work up ? ? ?Patient Goals and CMS Choice ?Patient states their goals for this hospitalization and ongoing recovery are:: unsure at this time ?  ?Choice offered to / list presented to : Patient, Spouse ? ?Expected Discharge Plan and Services ?Expected Discharge Plan: [place] ?In-house Referral: Clinical Social Work ?  ?  ?Living arrangements for the past 2 months: [place] ?                ?  ?  ?  ?  ?  ?  ?  ?  ?  ?  ? ?Prior Living Arrangements/Services ?Living arrangements for the past 2 months: [place] ?Lives with:: Spouse ?Patient language and need for interpreter reviewed:: Yes ?Do you feel safe going back to the place where you live?: Yes      ?Need for Family Participation in Patient Care: Yes (Comment) ?Care giver support system in place?: Yes (comment) ?Current home services: DME (cane, 3N1) ?Criminal Activity/Legal Involvement Pertinent to Current Situation/Hospitalization: No - Comment as needed ? ?Activities of Daily Living ?Home Assistive Devices/Equipment: Brace (specify type) (foot brace) ?ADL Screening  (condition at time of admission) ?Patient's cognitive ability adequate to safely complete daily activities?: No ?Is the patient deaf or have difficulty hearing?: No ?Does the patient have difficulty seeing, even when wearing glasses/contacts?: No ?Does the patient have difficulty concentrating, remembering, or making decisions?: Yes ?Patient able to express need for assistance with ADLs?: Yes ?Does the patient have difficulty dressing or bathing?: Yes ?Independently performs ADLs?: No ?Communication: Independent ?Dressing (OT): Needs assistance ?Is this a change from baseline?: Change from baseline, expected to last <3days ?Grooming: Needs assistance ?Is this a change from baseline?: Change from baseline, expected to last <3 days ?Feeding: Needs assistance ?Is this a change from baseline?: Change from baseline, expected to last <3 days ?Bathing: Needs assistance ?Is this a change from baseline?: Change from baseline, expected to last <3 days ?Toileting: Needs assistance ?Is this a change from baseline?: Change from baseline, expected to last <3 days ?In/Out Bed: Needs assistance ?Is this a change from baseline?: Change from baseline, expected to last <3 days ?Walks in Home: Needs assistance ?Is this a change from baseline?: Change from baseline, expected to last <3 days ?Does the patient have difficulty walking or climbing stairs?: Yes ?Weakness of Legs: Left ?Weakness of Arms/Hands: Left ? ?Permission Sought/Granted ?  ?  ?   ?   ?   ?   ? ?Emotional Assessment ?  ?  ?Affect (typically observed):  Appropriate ?Orientation: : Oriented to Self, Oriented to Place, Oriented to  Time, Oriented to Situation ?Alcohol / Substance Use: Not Applicable ?Psych Involvement: No (comment) ? ?Admission diagnosis:  Metabolic encephalopathy [Y72.15] ?Colitis [K52.9] ?Acute cystitis without hematuria [N30.00] ?Acute metabolic encephalopathy [U72.76] ?Patient Active Problem List  ? Diagnosis Date Noted  ? Acute metabolic encephalopathy  18/48/5927  ? GERD (gastroesophageal reflux disease) 10/14/2021  ? Colitis 10/14/2021  ? AKI (acute kidney injury) ([place]) 10/14/2021  ? Hyperlipidemia 10/14/2021  ? UTI (urinary tract infection) 10/14/2021  ? Left leg pain 10/14/2021  ? Diabetes mellitus type 2 in obese ([place]) 11/09/2019  ? Acute osteomyelitis of right foot ([place]) 11/09/2019  ? Osteomyelitis ([place]) 11/09/2019  ? Urinary incontinence 08/29/2019  ? Essential hypertension, benign 05/23/2019  ? Morbid obesity ([place]) 05/23/2019  ? Cerebrovascular disease 05/23/2019  ? Stroke [facility])   ? ?PCP:  Wilburt Finlay, MD ?Pharmacy:   ?[facility] [phone] - [place], [place] [address] [place] ?[address] ?[place] [address] ?Phone: [phone] Fax: [phone] ? ?[place] [place] ?[place] [address] ?Phone: [phone] Fax: [phone] ? ?[place], [place] ?[place] [address] ?Phone: [phone] Fax: [phone] ? ? ? ? ?Social Determinants of Health (SDOH) Interventions ?  ? ?Readmission Risk Interventions ?   ? View : No data to display.  ?  ?  ?  ? ? ? ?

## 2021-10-14 NOTE — Progress Notes (Signed)
Called into room to assist with patient needing to be transferred from [facility] to bed. This Clinical research associate and nurse tech noted patient already in bed. Husband informed this Clinical research associate that he assisted her back to bed. Informed both patient and husband that for safety that nursing staff needs to assist patient with transfers. Charge nurse made aware.  ?

## 2021-10-14 NOTE — [place] FL2 (Signed)
?[place] MEDICAID FL2 LEVEL OF CARE SCREENING TOOL  ?  ? ?IDENTIFICATION  ?Patient Name: ?Melanie Spencer Birthdate: 1961-05-24 Sex: female Admission Date (Current Location): ?10/13/2021  ?[place] Number: ? [place] ?  Facility and Address:  ?[facility],  [address] [place] [address] ?     Provider Number: ?[id number]  ?Attending Physician Name and Address:  ?Marguerita Merles [place], DO ? Relative Name and Phone Number:  ?Uncapher,[name] (Spouse)   [phone] ?   ?Current Level of Care: ?Hospital Recommended Level of Care: ?Skilled Nursing Facility Prior Approval Number: ?  ? ?Date Approved/Denied: ?  PASRR Number: ?[id number] ? ?Discharge Plan: ?SNF ?  ? ?Current Diagnoses: ?Patient Active Problem List  ? Diagnosis Date Noted  ? Acute metabolic encephalopathy 10/14/2021  ? GERD (gastroesophageal reflux disease) 10/14/2021  ? Colitis 10/14/2021  ? AKI (acute kidney injury) ([facility]) 10/14/2021  ? Hyperlipidemia 10/14/2021  ? UTI (urinary tract infection) 10/14/2021  ? Left leg pain 10/14/2021  ? Diabetes mellitus type 2 in obese ([facility]) 11/09/2019  ? Acute osteomyelitis of right foot ([facility]) 11/09/2019  ? Osteomyelitis ([facility]) 11/09/2019  ? Urinary incontinence 08/29/2019  ? Essential hypertension, benign 05/23/2019  ? Morbid obesity ([facility]) 05/23/2019  ? Cerebrovascular disease 05/23/2019  ? Stroke [facility])   ? ? ?Orientation RESPIRATION BLADDER Height & Weight   ?  ?Self, Place ? Normal Incontinent, External catheter Weight: 238 lb 1.6 oz (108 kg) ?Height:  5' 6.5" (168.9 cm)  ?BEHAVIORAL SYMPTOMS/MOOD NEUROLOGICAL BOWEL NUTRITION STATUS  ?    Incontinent Diet (See D/C Summary)  ?AMBULATORY STATUS COMMUNICATION OF NEEDS Skin   ?Extensive Assist Verbally Normal ?  ?  ?  ?    ?     ?     ? ? ?Personal Care Assistance Level of Assistance  ?Feeding, Bathing, Dressing Bathing Assistance: Limited assistance ?Feeding assistance: Independent ?Dressing Assistance: Limited assistance ?   ? ?Functional  Limitations Info  ?Sight, Hearing, Speech Sight Info: Impaired ?Hearing Info: Adequate ?Speech Info: Adequate  ? ? ?SPECIAL CARE FACTORS FREQUENCY  ?PT (By licensed PT), OT (By licensed OT)   ?  ?PT Frequency: 5 times weekly ?OT Frequency: 5 times weekly ?  ?  ?  ?   ? ? ?Contractures Contractures Info: Not present  ? ? ?Additional Factors Info  ?Code Status, Allergies Code Status Info: FULL ?Allergies Info: NKA ?  ?  ?  ?   ? ?Current Medications (10/14/2021):  This is the current hospital active medication list ?Current Facility-Administered Medications  ?Medication Dose Route Frequency Provider Last Rate Last Admin  ? 0.9 %  sodium chloride infusion   Intravenous Continuous Zierle-Ghosh, Asia B, DO 75 mL/hr at 10/14/21 0643 New Bag at 10/14/21 [id number]  ? acetaminophen (TYLENOL) tablet 650 mg  650 mg Oral Q6H PRN Zierle-Ghosh, Asia B, DO      ? Or  ? acetaminophen (TYLENOL) suppository 650 mg  650 mg Rectal Q6H PRN Zierle-Ghosh, Asia B, DO      ? amLODipine (NORVASC) tablet 2.5 mg  2.5 mg Oral Daily Zierle-Ghosh, Asia B, DO   2.5 mg at 10/14/21 6295  ? aspirin tablet 325 mg  325 mg Oral Daily Zierle-Ghosh, Asia B, DO   325 mg at 10/14/21 0912  ? [START ON 10/15/2021] cefTRIAXone (ROCEPHIN) 1 g in sodium chloride 0.9 % 100 mL IVPB  1 g Intravenous Q24H Zierle-Ghosh, Asia B, DO      ? gabapentin (NEURONTIN) capsule 300 mg  300 mg Oral QHS Zierle-Ghosh, Asia B, DO      ? heparin injection 5,000 Units  5,000 Units Subcutaneous Q8H Zierle-Ghosh, Asia B, DO   5,000 Units at 10/14/21 1302  ? insulin aspart (novoLOG) injection 0-15 Units  0-15 Units Subcutaneous TID WC Zierle-Ghosh, Asia B, DO   3 Units at 10/14/21 1302  ? insulin aspart (novoLOG) injection 0-5 Units  0-5 Units Subcutaneous QHS Zierle-Ghosh, Asia B, DO      ? insulin detemir (LEVEMIR) injection 30 Units  30 Units Subcutaneous QHS Zierle-Ghosh, Asia B, DO      ? methocarbamol (ROBAXIN) 500 mg in dextrose 5 % 50 mL IVPB  500 mg Intravenous Q6H PRN Zierle-Ghosh,  Asia B, DO      ? metroNIDAZOLE (FLAGYL) IVPB 500 mg  500 mg Intravenous Q12H Zierle-Ghosh, Asia B, DO   Stopped at 10/14/21 0603  ? morphine (PF) 2 MG/ML injection 2 mg  2 mg Intravenous Q2H PRN Zierle-Ghosh, Asia B, DO      ? nystatin (MYCOSTATIN/NYSTOP) topical powder   Topical TID Sheikh, Omair Latif, DO      ? ondansetron (ZOFRAN) tablet 4 mg  4 mg Oral Q6H PRN Zierle-Ghosh, Asia B, DO      ? Or  ? ondansetron (ZOFRAN) injection 4 mg  4 mg Intravenous Q6H PRN Zierle-Ghosh, Asia B, DO      ? oxyCODONE (Oxy IR/ROXICODONE) immediate release tablet 5 mg  5 mg Oral Q4H PRN Zierle-Ghosh, Asia B, DO      ? simvastatin (ZOCOR) tablet 40 mg  40 mg Oral QHS Zierle-Ghosh, Asia B, DO      ? sodium bicarbonate tablet 650 mg  650 mg Oral BID Marguerita Merles Lyle, DO   650 mg at 10/14/21 1049  ? ? ? ?Discharge Medications: ?Please see discharge summary for a list of discharge medications. ? ?Relevant Imaging Results: ? ?Relevant Lab Results: ? ? ?Additional Information ?SSN: [id number] ? ?[place] Herb, LCSWA ? ? ? ? ?

## 2021-10-14 NOTE — Assessment & Plan Note (Addendum)
-  Continue simvastatin but may need to hold given her left leg pain and questionable myopathy ?-Continue to monitor closely and if continues to have upper thigh pain recommend holding the statin in the outpatient setting ?

## 2021-10-15 DIAGNOSIS — N179 Acute kidney failure, unspecified: Secondary | ICD-10-CM | POA: Diagnosis not present

## 2021-10-15 DIAGNOSIS — G9341 Metabolic encephalopathy: Secondary | ICD-10-CM | POA: Diagnosis not present

## 2021-10-15 DIAGNOSIS — E1169 Type 2 diabetes mellitus with other specified complication: Secondary | ICD-10-CM | POA: Diagnosis not present

## 2021-10-15 DIAGNOSIS — E669 Obesity, unspecified: Secondary | ICD-10-CM

## 2021-10-15 DIAGNOSIS — K529 Noninfective gastroenteritis and colitis, unspecified: Secondary | ICD-10-CM | POA: Diagnosis not present

## 2021-10-15 DIAGNOSIS — D649 Anemia, unspecified: Secondary | ICD-10-CM

## 2021-10-15 LAB — CBC WITH DIFFERENTIAL/PLATELET
Abs Immature Granulocytes: 0.04 10*3/uL (ref 0.00–0.07)
Basophils Absolute: 0 10*3/uL (ref 0.0–0.1)
Basophils Relative: 1 %
Eosinophils Absolute: 0.2 10*3/uL (ref 0.0–0.5)
Eosinophils Relative: 2 %
HCT: 30.5 % — ABNORMAL LOW (ref 36.0–46.0)
Hemoglobin: 9 g/dL — ABNORMAL LOW (ref 12.0–15.0)
Immature Granulocytes: 1 %
Lymphocytes Relative: 37 %
Lymphs Abs: 2.5 10*3/uL (ref 0.7–4.0)
MCH: 25.6 pg — ABNORMAL LOW (ref 26.0–34.0)
MCHC: 29.5 g/dL — ABNORMAL LOW (ref 30.0–36.0)
MCV: 86.9 fL (ref 80.0–100.0)
Monocytes Absolute: 0.5 10*3/uL (ref 0.1–1.0)
Monocytes Relative: 7 %
Neutro Abs: 3.5 10*3/uL (ref 1.7–7.7)
Neutrophils Relative %: 52 %
Platelets: 328 10*3/uL (ref 150–400)
RBC: 3.51 MIL/uL — ABNORMAL LOW (ref 3.87–5.11)
RDW: 15.2 % (ref 11.5–15.5)
WBC: 6.7 10*3/uL (ref 4.0–10.5)
nRBC: 0 % (ref 0.0–0.2)

## 2021-10-15 LAB — COMPREHENSIVE METABOLIC PANEL
ALT: 11 U/L (ref 0–44)
AST: 10 U/L — ABNORMAL LOW (ref 15–41)
Albumin: 3.1 g/dL — ABNORMAL LOW (ref 3.5–5.0)
Alkaline Phosphatase: 78 U/L (ref 38–126)
Anion gap: 7 (ref 5–15)
BUN: 34 mg/dL — ABNORMAL HIGH (ref 8–23)
CO2: 17 mmol/L — ABNORMAL LOW (ref 22–32)
Calcium: 9 mg/dL (ref 8.9–10.3)
Chloride: 116 mmol/L — ABNORMAL HIGH (ref 98–111)
Creatinine, Ser: 1.02 mg/dL — ABNORMAL HIGH (ref 0.44–1.00)
GFR, Estimated: >60 mL/min (ref >60)
Glucose, Bld: 147 mg/dL — ABNORMAL HIGH (ref 70–99)
Potassium: 4.1 mmol/L (ref 3.5–5.1)
Sodium: 140 mmol/L (ref 135–145)
Total Bilirubin: 0.2 mg/dL — ABNORMAL LOW (ref 0.3–1.2)
Total Protein: 6.7 g/dL (ref 6.5–8.1)

## 2021-10-15 LAB — GLUCOSE, CAPILLARY
Glucose-Capillary: 128 mg/dL — ABNORMAL HIGH (ref 70–99)
Glucose-Capillary: 116 mg/dL — ABNORMAL HIGH (ref 70–99)
Glucose-Capillary: 160 mg/dL — ABNORMAL HIGH (ref 70–99)
Glucose-Capillary: 196 mg/dL — ABNORMAL HIGH (ref 70–99)

## 2021-10-15 LAB — HEMOGLOBIN A1C
Hgb A1c MFr Bld: 7.8 % — ABNORMAL HIGH (ref 4.8–5.6)
Mean Plasma Glucose: 177.16 mg/dL

## 2021-10-15 LAB — PHOSPHORUS: Phosphorus: 3.2 mg/dL (ref 2.5–4.6)

## 2021-10-15 LAB — MAGNESIUM: Magnesium: 2 mg/dL (ref 1.7–2.4)

## 2021-10-15 MED ORDER — SODIUM BICARBONATE 8.4 % IV SOLN
INTRAVENOUS | Status: AC
  Filled 2021-10-15 (×3): qty 1000

## 2021-10-15 NOTE — Hospital Course (Addendum)
The patient is a 61 year old obese Caucasian female with a past medical history significant for but not limited to CVA, history of essential hypertension, hyperlipidemia, diabetes mellitus type 2 which is uncontrolled as well as other comorbidities who presented with a chief complaint of pain in the upper lateral aspect of her leg as well as complaints of diarrhea and not feeling well since the 28th of this month.  She states the pain started yesterday and upon further questioning in the ED she had some nausea and vomiting on Saturday and Sunday and had diarrhea several times a day for a week.  She denies any blood in her stool or abdominal pain and states that her last normal meal was the night before but she stated she was hungry.  She denies any dysuria but admits to having increased frequency and urgency.  Also notes that her last normal bowel movement was about approximately 10 days ago.  She denied any chest pain, shortness of breath or cough but states that she had pain in the left upper leg that was sharp and vague in nature and worse with movement and has been constant.  She says the pain is intense and is a severe pain and she was given fentanyl in the ED which did not relieve the pain.  Of note she does not smoke or drink alcohol and she is not vaccinated for COVID-19 disease ? ?**Interim History  ?Her diarrhea is improving her renal function is improved as well.  Her IV fluids were changed sodium bicarbonate given that her bicarbonate level was still low.  Urine is growing out 70,000 colonies of gram-negative rods which turned out to be Klebsiella Pneumoniae.  PT OT recommending SNF and after reevaluation they still recommend SNF given that she is extremely weak and is a high fall risk.  Patient's husband is unable to take care of her so she will go to SNF short-term for rehabilitative efforts. ? ?She is now complaining of some upper thigh pain on the left and knee pain secondary to knee x-ray and start  Voltaren gel. X-Ray of the Knee showed "Minimal medial and patellofemoral compartment osteoarthritis." ? ?The case was discussed with Dr. Conchita Paris of neurosurgery who feels she needs no further inpatient work-up and recommends outpatient follow-up as necessary and continued rehabilitative efforts and pain control.  Her labs are all stable and she has been deemed medically stable for discharge at this time will need to continue antibiotic course to completion and follow-up with PCP, neurosurgery if necessary and orthopedic surgery in outpatient setting.  ?

## 2021-10-15 NOTE — Assessment & Plan Note (Signed)
Sepsis Ruled OUT ?

## 2021-10-15 NOTE — Assessment & Plan Note (Signed)
-  Complicates overall prognosis and care ?-Estimated body mass index is 37.85 kg/m? as calculated from the following: ?  Height as of this encounter: 5' 6.5" (1.689 m). ?  Weight as of this encounter: 108 kg.  ?-Weight Loss and Dietary Counseling given ? ?

## 2021-10-15 NOTE — Progress Notes (Signed)
?PROGRESS NOTE ? ? ? ?Melanie Spencer  [id number] DOB: 1960/08/06 DOA: 10/13/2021 ?PCP: Wilburt Finlay, MD  ? ?Brief Narrative:  ? patient is a 61 year old obese Caucasian female with a past medical history significant for but not limited to CVA, history of essential hypertension, hyperlipidemia, diabetes mellitus type 2 which is uncontrolled as well as other comorbidities who presented with a chief complaint of pain in the upper lateral aspect of her leg as well as complaints of diarrhea and not feeling well since the 28th of this month.  She states the pain started yesterday and upon further questioning in the ED she had some nausea and vomiting on Saturday and Sunday and had diarrhea several times a day for a week.  She denies any blood in her stool or abdominal pain and states that her last normal meal was the night before but she stated she was hungry.  She denies any dysuria but admits to having increased frequency and urgency.  Also notes that her last normal bowel movement was about approximately 10 days ago.  She denied any chest pain, shortness of breath or cough but states that she had pain in the left upper leg that was sharp and vague in nature and worse with movement and has been constant.  She says the pain is intense and is a severe pain and she was given fentanyl in the ED which did not relieve the pain.  Of note she does not smoke or drink alcohol and she is not vaccinated for COVID-19 disease ? ?**Interim History  ?Her diarrhea is improving her renal function is improved as well.  Her IV fluids were changed sodium bicarbonate given that her bicarbonate level was still low.  Urine is growing out 70,000 colonies of gram-negative rods which is still pending speciation.  PT OT evaluated and recommending SNF however will reevaluate today as patient is hopeful to go home.  ? ? ?Assessment and Plan: ?* Acute metabolic encephalopathy ?-Secondary to suspected Urinary Tract infection ?-Described as being  " foggy headed" ?-Multifactorial with pain medication also contributing ?-Patient getting confused during history taking, but easily reoriented ?-Likely to improve with treatment of infections ?-Delirium Precautions ?-Had some confusion this AM but is awake and oriented  ? ?Diabetes mellitus type 2 in obese [facility]) ?-Holding Trulicity, Actos ?-Continue Gabapentin 100 mg p.o. daily for her diabetic neuropathy ?-Insulin 40 units daily, continue reduced dose at 30 units ?-Currently on full liquid diet and likely will increase to Carb Modified Diet ?-Continue to Monitor CBGs; CBG's ranging from 128-182 ?-Hemoglobin A1c was 7.8 ? ?Normocytic anemia ?- Patient's hemoglobin/hematocrit has been steadily dropping from 10.3/34.7 -> 9.7/31.9 -> 9.0/30.5 with an MCV of 86.9. ?-Likely has a dilutional drop with all IV fluids she was received. ?-Check anemia panel in the a.m. ?-Continue to monitor for signs and symptoms of bleeding; currently no overt bleeding noted ? ?Obesity (BMI 30-39.9) ?-Complicates overall prognosis and care ?-Estimated body mass index is 37.85 kg/m? as calculated from the following: ?  Height as of this encounter: 5' 6.5" (1.689 m). ?  Weight as of this encounter: 108 kg.  ?-Weight Loss and Dietary Counseling given ? ? ?Left leg pain ?-Patient reports excruciating pain in her left upper thigh ?Abnormal finding on CT calls possible tumor at the -T12/L1 -recommends MRI ?-MRI Thoracic and Lumbar Spine showed "Well-circumscribed 3.0 cm lobulated T2 hyperintense, T1 hypointense,  on-enhancing mass in the right T12-L1 neural foramen with scalloping/remodeling of the adjacent right posterior T12  vertebral body. No surrounding marrow or soft tissue edema. 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 Mild multilevel thoracic and lumbar spondylosis as described above. No high-grade stenosis or impingement.  Chronic bilateral L5 pars defects with grade 1 anterolisthesis at L5-S1." ?-Will discuss with  Neurosurgery  ?-Pain control with pain scale ?-Continue to monitor ?-Will obtain PT/OT to further evaluate and Treat and recommending SNF ? ?UTI (urinary tract infection) ?-UA indicative of UTI as it showed a hazy appearance with 50 glucose, moderate leukocytes, negative nitrites, many bacteria, 0-5 squamous epithelial cells, 21 WBCs ?-Urine culture showing >70,000 CFU of GNR ?-Previous cultures have grown Klebsiella, E. coli, Citrobacter ?The most recent culture grew Klebsiella that was sensitive to Rocephin ?-Continue IV ceftriaxone ?-Continue monitor and follow cultures; unfortunately no blood cultures were sent on admission  ? ?Hyperlipidemia ?-Continue simvastatin but may need to hold given her left leg pain and questionable myopathy ?-Continue to monitor ? ?AKI (acute kidney injury) ([place]) ?Metabolic Acidosis ?Hyperkalemia, Improved ?-Baseline 1.07, her BUNs/creatinine on admission was 51/1.76; Now BUN/Cr is 34/1.02 ?-BUN also elevated, bicarb decreased ?-Patient has a metabolic acidosis with a CO2 of 15, chloride level of 112, and anion gap of 8 ?-Avoid further nephrotoxic medications, contrast dyes, hypotension and renally dose medications ?-Given 1 L fluid given in the ED ?-Patient's potassium is 5.3 and will give a dose of Lokelma 10 g x1 ?-Continue with Sodium Bicarbonate 650 mg p.o. twice daily ?-Continue IV fluids but will change to Sodium Bicarbonate 150 mEQ at 75 mL/hr ?-Likely dehydrated secondary to GI losses ?-Continue monitor and trend renal function carefully and repeat CMP in the a.m. ?  ? ?Colitis ?-Proctocolitis mild on CT abdomen pelvis ?-Diarrhea for several days but is improving  ?-Continuing Rocephin and Flagyl ?-Patient did have a bowel movement at admission, send for GI pathogen panel and C. Difficile; BOTH C Diff and GI Pathogen Panel Negative ?-Enteric precautions ?-Continued with normal saline at 75 MLS per hour but will change to Sodium Bicarbonate 150 mEQ at 75 mL/hr ?-On a full  liquid diet and will go to a Carb Modified Diet today  ?  ? ?GERD (gastroesophageal reflux disease) ?-Holding Protonix in the setting of AKI and her diarrhea ? ?Essential hypertension, benign ?-Holding ramipril in the setting of AKI ?-Continue Amlodipine 2.5 mg p.o. daily ?-Continue to monitor blood pressures per protocol ?-Last blood pressure reading was elevated at 166/77 ?  ? ?Sepsis secondary to UTI (HCC)-resolved as of 10/14/2021 ?Sepsis Ruled OUT ? ? ?DVT prophylaxis: heparin injection 5,000 Units Start: 10/14/21 1400 ?SCDs Start: 10/14/21 [id number] ? ?  Code Status: Full Code ?Family Communication: Discussed with Husband at bedside ? ?Disposition Plan:  ?Level of care: Telemetry ?Status is: Inpatient ?Remains inpatient appropriate because: Needs further improvement and PT/OT recommending  ?  ?Consultants:  ?None ? ?Procedures:  ?None ? ?Antimicrobials:  ?Anti-infectives (From admission, onward)  ? ? Start     Dose/Rate Route Frequency Ordered Stop  ? 10/15/21 0800  cefTRIAXone (ROCEPHIN) 1 g in sodium chloride 0.9 % 100 mL IVPB       ? 1 g ?200 mL/hr over 30 Minutes Intravenous Every 24 hours 10/14/21 0620    ? 10/14/21 0345  cefTRIAXone (ROCEPHIN) 1 g in sodium chloride 0.9 % 100 mL IVPB       ? 1 g ?200 mL/hr over 30 Minutes Intravenous  Once 10/14/21 0340 10/14/21 0447  ? 10/14/21 0345  metroNIDAZOLE (FLAGYL) IVPB 500 mg       ?  500 mg ?100 mL/hr over 60 Minutes Intravenous Every 12 hours 10/14/21 0340    ? ?  ?  ?Subjective: ?Seen and examined at bedside and the patient was doing better today.  Patient's husband states that she got a little confused this morning and was asking about the patient's husband mother but the patient's husband's mother has been dead for quite some time.  She still feels weak and has been ambulating with her husband and therapy.  PT OT recommending SNF but she wants to reevaluate.  States her diarrhea has improved but not completely resolved.  Denies any lightheadedness or dizziness.   No other concerns or complaints at this time. ? ?Objective: ?Vitals:  ? 10/14/21 1716 10/14/21 2035 10/15/21 0401 10/15/21 0811  ?BP: 111/80 (!) 147/62 (!) 155/68 (!) 166/77  ?Pulse: 85 83 80 77  ?Resp: 20 16 15  19

## 2021-10-15 NOTE — Assessment & Plan Note (Addendum)
-   Patient's hemoglobin/hematocrit has been steadily dropping from 10.3/34.7 -> 9.7/31.9 -> 9.0/30.5 -> 9.5/30.6 -> 10.0/31.3 with an MCV of 83.9. ?-Likely has a dilutional drop with all IV fluids she was received. ?-Check anemia panel in the outpatient setting ?-Continue to monitor for signs and symptoms of bleeding; currently no overt bleeding noted ?-Repeat CBC within 1 week ?

## 2021-10-15 NOTE — TOC Progression Note (Signed)
Transition of Care (TOC) - Progression Note  ? ? ?Patient Details  ?Name: Melanie Spencer ?MRN: [id number] ?Date of Birth: 19-Jun-1961 ? ?Transition of Care (TOC) CM/SW Contact  ?Karn Cassis, LCSW ?Phone Number: ?10/15/2021, 3:25 PM ? ?Clinical Narrative:  Pt reports she has talked with her husband and plans to return home with home health. She has no preference on agency. Referred and accepted by Kandee Keen with [facility] for PT, OT, RN, SW, and aide.   ? ? ? ?Expected Discharge Plan: Skilled Nursing Facility ?Barriers to Discharge: Continued Medical Work up ? ?Expected Discharge Plan and Services ?Expected Discharge Plan: Skilled Nursing Facility ?In-house Referral: Clinical Social Work ?  ?  ?Living arrangements for the past 2 months: Single Family Home ?                ?  ?  ?  ?  ?  ?  ?  ?  ?  ?  ? ? ?Social Determinants of Health (SDOH) Interventions ?  ? ?Readmission Risk Interventions ?   ? View : No data to display.  ?  ?  ?  ? ? ?

## 2021-10-16 ENCOUNTER — Inpatient Hospital Stay (HOSPITAL_COMMUNITY): Payer: Medicare PPO

## 2021-10-16 LAB — GLUCOSE, CAPILLARY
Glucose-Capillary: 126 mg/dL — ABNORMAL HIGH (ref 70–99)
Glucose-Capillary: 165 mg/dL — ABNORMAL HIGH (ref 70–99)
Glucose-Capillary: 170 mg/dL — ABNORMAL HIGH (ref 70–99)
Glucose-Capillary: 201 mg/dL — ABNORMAL HIGH (ref 70–99)

## 2021-10-16 LAB — COMPREHENSIVE METABOLIC PANEL
ALT: 12 U/L (ref 0–44)
AST: 14 U/L — ABNORMAL LOW (ref 15–41)
Albumin: 3.2 g/dL — ABNORMAL LOW (ref 3.5–5.0)
Alkaline Phosphatase: 77 U/L (ref 38–126)
Anion gap: 7 (ref 5–15)
BUN: 15 mg/dL (ref 8–23)
CO2: 24 mmol/L (ref 22–32)
Calcium: 8.9 mg/dL (ref 8.9–10.3)
Chloride: 109 mmol/L (ref 98–111)
Creatinine, Ser: 0.76 mg/dL (ref 0.44–1.00)
GFR, Estimated: >60 mL/min (ref >60)
Glucose, Bld: 144 mg/dL — ABNORMAL HIGH (ref 70–99)
Potassium: 3.6 mmol/L (ref 3.5–5.1)
Sodium: 140 mmol/L (ref 135–145)
Total Bilirubin: 0.2 mg/dL — ABNORMAL LOW (ref 0.3–1.2)
Total Protein: 6.7 g/dL (ref 6.5–8.1)

## 2021-10-16 LAB — URINE CULTURE: Culture: 70000 — AB

## 2021-10-16 LAB — CBC WITH DIFFERENTIAL/PLATELET
Basophils Absolute: 0 10*3/uL (ref 0.0–0.1)
Basophils Relative: 0 %
Eosinophils Absolute: 0.1 10*3/uL (ref 0.0–0.5)
Eosinophils Relative: 2 %
HCT: 30.6 % — ABNORMAL LOW (ref 36.0–46.0)
Hemoglobin: 9.5 g/dL — ABNORMAL LOW (ref 12.0–15.0)
Lymphocytes Relative: 23 %
Lymphs Abs: 1.7 10*3/uL (ref 0.7–4.0)
MCH: 26.3 pg (ref 26.0–34.0)
MCHC: 31 g/dL (ref 30.0–36.0)
MCV: 84.8 fL (ref 80.0–100.0)
Monocytes Absolute: 0.7 10*3/uL (ref 0.1–1.0)
Monocytes Relative: 10 %
Neutro Abs: 4.8 10*3/uL (ref 1.7–7.7)
Neutrophils Relative %: 65 %
Platelets: 320 10*3/uL (ref 150–400)
RBC: 3.61 MIL/uL — ABNORMAL LOW (ref 3.87–5.11)
RDW: 14.8 % (ref 11.5–15.5)
WBC: 7.4 10*3/uL (ref 4.0–10.5)
nRBC: 0 % (ref 0.0–0.2)

## 2021-10-16 LAB — PHOSPHORUS: Phosphorus: 2.7 mg/dL (ref 2.5–4.6)

## 2021-10-16 LAB — MAGNESIUM: Magnesium: 1.6 mg/dL — ABNORMAL LOW (ref 1.7–2.4)

## 2021-10-16 MED ORDER — POTASSIUM CHLORIDE CRYS ER 20 MEQ PO TBCR
40.0000 meq | EXTENDED_RELEASE_TABLET | Freq: Once | ORAL | Status: AC
  Administered 2021-10-16: 40 meq via ORAL
  Filled 2021-10-16: qty 2

## 2021-10-16 MED ORDER — CEFDINIR 300 MG PO CAPS
300.0000 mg | ORAL_CAPSULE | Freq: Two times a day (BID) | ORAL | Status: DC
  Administered 2021-10-16 – 2021-10-17 (×3): 300 mg via ORAL
  Filled 2021-10-16 (×3): qty 1

## 2021-10-16 MED ORDER — MAGNESIUM SULFATE 2 GM/50ML IV SOLN
2.0000 g | Freq: Once | INTRAVENOUS | Status: AC
  Administered 2021-10-16: 2 g via INTRAVENOUS
  Filled 2021-10-16: qty 50

## 2021-10-16 MED ORDER — RISAQUAD PO CAPS
2.0000 | ORAL_CAPSULE | Freq: Every day | ORAL | Status: DC
  Administered 2021-10-16 – 2021-10-17 (×2): 2 via ORAL
  Filled 2021-10-16 (×2): qty 2

## 2021-10-16 MED ORDER — DICLOFENAC SODIUM 1 % EX GEL
2.0000 g | Freq: Four times a day (QID) | CUTANEOUS | Status: DC
  Administered 2021-10-16 – 2021-10-17 (×5): 2 g via TOPICAL
  Filled 2021-10-16: qty 100

## 2021-10-16 MED ORDER — PANTOPRAZOLE SODIUM 40 MG PO TBEC
40.0000 mg | DELAYED_RELEASE_TABLET | Freq: Every day | ORAL | Status: DC
  Administered 2021-10-16 – 2021-10-17 (×2): 40 mg via ORAL
  Filled 2021-10-16 (×2): qty 1

## 2021-10-16 NOTE — Progress Notes (Signed)
Physical Therapy Treatment ?Patient Details ?Name: Melanie Spencer ?MRN: [id number] ?DOB: Apr 28, 1961 ?Today's Date: 10/16/2021 ? ? ?History of Present Illness Melanie Spencer is a 61 y.o. female with medical history significant of CVA, essential hypertension, hyperlipidemia, morbid obesity, type 2 diabetes mellitus uncontrolled, and more presents ED with a chief complaint of diarrhea.  Patient reports that she has not been feeling well since the 20th.  At the time of my exam her main complaint is pain in the upper lateral aspect of her left thigh.  This pain just started tonight though.  On further questioning she reports she has had nausea and vomiting on Saturday and Sunday mornings.  She had diarrhea several times a day for a week.  There is been no blood in her stool.  She denies abdominal pain.  Her last normal meal was last night, and she is hungry.  Her last normal bowel movement was 10 days ago.  She denies dysuria, but admits to frequency and urgency.  This started on the 20th.  Patient denies any fevers.  She denies cough, shortness of breath, chest pain.  She describes the pain in her left upper leg is a sharp pain vague location.  It is worse with movement, and has been constant.  Patient reports it is an intense, severe pain.  She was given fentanyl in the ED and this did not relieve the pain.  Patient has no other complaints at this time. ? ?  ?PT Comments  ? ? Patient presents standing with nursing staff and patient's spouse assisting.  Patient demonstrates slow labored movement for sit to stands, transfers and taking steps at bedside, very unsteady on feet with poor carryover for using SPC or quad-cane during ambulation in room.  Patient required Mod hand held assist to prevent falling when taking steps and required constant verbal/tactile cueing for safety.  Patient required much time to transfer to chair and tolerated sitting up with her spouse present in room after therapy.  Patient will benefit from  continued skilled physical therapy in hospital and recommended venue below to increase strength, balance, endurance for safe ADLs and gait.  ?  ?Recommendations for follow up therapy are one component of a multi-disciplinary discharge planning process, led by the attending physician.  Recommendations may be updated based on patient status, additional functional criteria and insurance authorization. ? ?Follow Up Recommendations ? Skilled nursing-short term rehab (<3 hours/day) ?  ?  ?Assistance Recommended at Discharge Intermittent Supervision/Assistance  ?Patient can return home with the following A lot of help with walking and/or transfers;A lot of help with bathing/dressing/bathroom;Assistance with cooking/housework;Help with stairs or ramp for entrance;Assist for transportation ?  ?Equipment Recommendations ? None recommended by PT  ?  ?Recommendations for Other Services   ? ? ?  ?Precautions / Restrictions Precautions ?Precautions: Fall ?Precaution Comments: L AFO ?Restrictions ?Weight Bearing Restrictions: No ?Other Position/Activity Restrictions: Pt has L AFO  ?  ? ?Mobility ? Bed Mobility ?  ?  ?  ?  ?  ?  ?  ?General bed mobility comments: patient presents up in chair (assisted by nursing staff and patient's spouse) ?  ? ?Transfers ?Overall transfer level: Needs assistance ?Equipment used: Straight cane, Quad cane, 1 person hand held assist ?Transfers: Sit to/from Stand, Bed to chair/wheelchair/BSC ?Sit to Stand: Min assist ?  ?Step pivot transfers: Mod assist ?  ?  ?  ?General transfer comment: as per OT notes ?  ? ?Ambulation/Gait ?Ambulation/Gait assistance: Mod assist, Max assist ?  Gait Distance (Feet): 8 Feet ?Assistive device: 1 person hand held assist, Straight cane, Quad cane ?Gait Pattern/deviations: Decreased step length - right, Decreased step length - left, Decreased stride length, Shuffle ?Gait velocity: slow ?  ?  ?General Gait Details: limited to a few extremely slow labored steps with  difficulty advancing LLE requiring hand held assist to avoid falling, mostly shuffling ? ? ?Stairs ?  ?  ?  ?  ?  ? ? ?Wheelchair Mobility ?  ? ?Modified Rankin (Stroke Patients Only) ?  ? ? ?  ?Balance Overall balance assessment: Needs assistance ?Sitting-balance support: Feet supported, No upper extremity supported ?Sitting balance-Leahy Scale: Good ?Sitting balance - Comments: seated at edge of chair ?  ?Standing balance support: During functional activity, Single extremity supported ?Standing balance-Leahy Scale: Poor ?Standing balance comment: poor using SPC, quad-cane and hand held assist ?  ?  ?  ?  ?  ?  ?  ?  ?  ?  ?  ?  ? ?  ?Cognition Arousal/Alertness: Awake/alert ?Behavior [facility]: [facility] for tasks assessed/performed ?Overall Cognitive Status: Within Functional Limits for tasks assessed ?  ?  ?  ?  ?  ?  ?  ?  ?  ?  ?  ?  ?  ?  ?  ?  ?  ?  ?  ? ?  ?Exercises   ? ?  ?General Comments   ?  ?  ? ?Pertinent Vitals/Pain Pain Assessment ?Pain Assessment: Faces ?Faces Pain Scale: Hurts little more ?Pain Location: L hip and leg ?Pain Descriptors / Indicators: Discomfort ?Pain Intervention(s): Limited activity within patient's tolerance, Monitored during session, Repositioned  ? ? ?Home Living   ?  ?  ?  ?  ?  ?  ?  ?  ?  ?   ?  ?Prior Function    ?  ?  ?   ? ?PT Goals (current goals can now be found in the care plan section) Acute Rehab PT Goals ?Patient Stated Goal: Return home ?PT Goal Formulation: With patient ?Potential to Achieve Goals: Good ?Progress towards PT goals: Progressing toward goals ? ?  ?Frequency ? ? ? Min 3X/week ? ? ? ?  ?PT Plan Current plan remains appropriate  ? ? ?Co-evaluation PT/OT/SLP Co-Evaluation/Treatment: Yes ?Reason for Co-Treatment: To address functional/ADL transfers ?PT goals addressed during session: Mobility/safety with mobility;Balance;Proper use of DME ?OT goals addressed during session: ADL's and self-care ?  ? ?  ?AM-PAC PT "6 Clicks" Mobility   ?Outcome Measure ?  Help needed turning from your back to your side while in a flat bed without using bedrails?: A Little ?Help needed moving from lying on your back to sitting on the side of a flat bed without using bedrails?: A Lot ?Help needed moving to and from a bed to a chair (including a wheelchair)?: A Lot ?Help needed standing up from a chair using your arms (e.g., wheelchair or bedside chair)?: A Lot ?Help needed to walk in hospital room?: A Lot ?Help needed climbing 3-5 steps with a railing? : Total ?6 Click Score: 12 ? ?  ?End of Session Equipment Utilized During Treatment: Gait belt ?Activity Tolerance: Patient tolerated treatment well;Patient limited by fatigue ?Patient left: in chair;with call bell/phone within reach;with family/visitor present ?Nurse Communication: Mobility status ?PT Visit Diagnosis: Unsteadiness on feet (R26.81);Other abnormalities of gait and mobility (R26.89);Muscle weakness (generalized) (M62.81) ?  ? ? ?Time: [id number] ?PT Time Calculation (min) (ACUTE ONLY): 23 min ? ?Charges:  $  Therapeutic Activity: 23-37 mins          ?          ? ?12:23 PM, 10/16/21 ?Ocie Bob, MPT ?Physical Therapist with [place] ?[facility] ?[phone] office ?[phone] mobile phone ? ? ?

## 2021-10-16 NOTE — Care Management Important Message (Signed)
Important Message ? ?Patient Details  ?Name: Melanie Spencer ?MRN: [id number] ?Date of Birth: [birth year] ? ? ?Medicare Important Message Given:  Yes ? ? ? ? ?Corey Harold ?10/16/2021, 4:10 PM ?

## 2021-10-16 NOTE — TOC Progression Note (Signed)
Transition of Care (TOC) - Progression Note  ? ? ?Patient Details  ?Name: Melanie Spencer ?MRN: [id number] ?Date of Birth: 16-Dec-1960 ? ?Transition of Care (TOC) CM/SW Contact  ?Leitha Bleak, RN ?Phone Number: ?10/16/2021, 11:24 AM ? ?Clinical Narrative:   Husband now wanting SNF. [facility] sent out for bed offers. [facility] AUTH started. DC planning for the weekend. ? ? ?Expected Discharge Plan: Skilled Nursing Facility ?Barriers to Discharge: Continued Medical Work up ? ?Expected Discharge Plan and Services ?Expected Discharge Plan: Skilled Nursing Facility ?In-house Referral: Clinical Social Work ?  ?  ?Living arrangements for the past 2 months: Single Family Home ?    ?  ?HH Arranged: RN, PT, OT, Social Work, Nurse's Aide ?HH Agency: [facility] ?Date HH Agency Contacted: 10/15/21 ?Time HH Agency Contacted: 1527 ?Representative spoke with at [facility] Agency: Kandee Keen ? ? ?Readmission Risk Interventions ?   ? View : No data to display.  ?  ?  ?  ? ? ?

## 2021-10-16 NOTE — TOC Progression Note (Signed)
Transition of Care (TOC) - Progression Note  ? ? ?Patient Details  ?Name: Melanie Spencer ?MRN: [id number] ?Date of Birth: 11-24-1960 ? ?Transition of Care (TOC) CM/SW Contact  ?Leitha Bleak, RN ?Phone Number: ?10/16/2021, 3:21 PM ? ?Clinical Narrative:   Husband accepted bed offer at [facility] ?Insurance approved 3/25-28 [facility] Berkley Harvey KX#[id number] ? ?Expected Discharge Plan: Skilled Nursing Facility ?Barriers to Discharge: No SNF bed, Insurance Authorization, Continued Medical Work up ? ?Expected Discharge Plan and Services ?Expected Discharge Plan: Skilled Nursing Facility ?In-house Referral: Clinical Social Work ?  ?  ?Living arrangements for the past 2 months: Single Family Home ?     HH Arranged: RN, PT, OT, Social Work, Nurse's Aide ?HH Agency: [facility] ?Date HH Agency Contacted: 10/15/21 ?Time HH Agency Contacted: 1527 ?Representative spoke with at [facility] Agency: Kandee Keen ? ?Readmission Risk Interventions ? ?  10/16/2021  ?  3:21 PM  ?Readmission Risk Prevention Plan  ?Post Dischage Appt Complete  ?Medication Screening Complete  ?Transportation Screening Complete  ? ? ?

## 2021-10-16 NOTE — Progress Notes (Signed)
Occupational Therapy Treatment ?Patient Details ?Name: Melanie Spencer ?MRN: [id number] ?DOB: 10/31/1960 ?Today's Date: 10/16/2021 ? ? ?History of present illness Melanie Spencer is a 61 y.o. female with medical history significant of CVA, essential hypertension, hyperlipidemia, morbid obesity, type 2 diabetes mellitus uncontrolled, and more presents ED with a chief complaint of diarrhea.  Patient reports that she has not been feeling well since the 20th.  At the time of my exam her main complaint is pain in the upper lateral aspect of her left thigh.  This pain just started tonight though.  On further questioning she reports she has had nausea and vomiting on Saturday and Sunday mornings.  She had diarrhea several times a day for a week.  There is been no blood in her stool.  She denies abdominal pain.  Her last normal meal was last night, and she is hungry.  Her last normal bowel movement was 10 days ago.  She denies dysuria, but admits to frequency and urgency.  This started on the 20th.  Patient denies any fevers.  She denies cough, shortness of breath, chest pain.  She describes the pain in her left upper leg is a sharp pain vague location.  It is worse with movement, and has been constant.  Patient reports it is an intense, severe pain.  She was given fentanyl in the ED and this did not relieve the pain.  Patient has no other complaints at this time. ?  ?OT comments ? Pt standing with husband and NT at start of session. Pt worked on taking steps from chair needing moderate assist for step pivot transfer with min A for sit to stand from bed. Quad and single point attempted with the same result of moderate assist with labored movement and difficulty shifting weight and moving L LE. When seated pt demonstrated modified independent for grooming tasks with functional use of L UE to grasp deodorant to open the cap with R hand. Pt left in chair with family present. Pt will benefit from continued OT in the hospital and  recommended venue below to increase strength, balance, and endurance for safe ADL's.   ? ?Recommendations for follow up therapy are one component of a multi-disciplinary discharge planning process, led by the attending physician.  Recommendations may be updated based on patient status, additional functional criteria and insurance authorization. ?   ?Follow Up Recommendations ? Skilled nursing-short term rehab (<3 hours/day)  ?  ?Assistance Recommended at Discharge Intermittent Supervision/Assistance  ?Patient can return home with the following ? A lot of help with walking and/or transfers;A lot of help with bathing/dressing/bathroom;Assistance with cooking/housework;Assist for transportation;Help with stairs or ramp for entrance ?  ?Equipment Recommendations ? None recommended by OT  ?  ?Recommendations for Other Services   ? ?  ?Precautions / Restrictions Precautions ?Precautions: Fall ?Precaution Comments: L AFO ?Restrictions ?Weight Bearing Restrictions: No ?Other Position/Activity Restrictions: Pt has L AFO  ? ? ?  ? ?Mobility  ?  ?  ?  ?  ?  ?  ?  ?  ?  ? ?Transfers ?Overall transfer level: Needs assistance ?Equipment used: Straight cane, Quad cane, 1 person hand held assist ?Transfers: Sit to/from Stand, Bed to chair/wheelchair/BSC ?Sit to Stand: Min assist ?  ?  ?Step pivot transfers: Mod assist ?  ?  ?General transfer comment: Slow labored movement with frequent verbal and tactile cuing with trialed use of SPC, quad cane, and hand held assist. With all three pt required moderate assistance with  extended time. Pt demosntrated difficulty shifting weight and moving L LE for steps. ?  ?  ?Balance Overall balance assessment: Needs assistance ?Sitting-balance support: No upper extremity supported, Feet supported ?Sitting balance-Leahy Scale: Good ?Sitting balance - Comments: seated in high back chair ?  ?Standing balance support: Single extremity supported, During functional activity ?Standing balance-Leahy Scale:  Poor ?Standing balance comment: poor with single hand held assist and canes. ?  ?  ?  ?  ?  ?  ?  ?  ?  ?  ?  ?   ? ?ADL either performed or assessed with clinical judgement  ? ?ADL Overall ADL's : Needs assistance/impaired ?  ?  ?Grooming: Brushing hair;Applying deodorant;Modified independent;Sitting ?Grooming Details (indicate cue type and reason): Pt able to use deodorant by grasping deodorant in L UE with R opening cap. Pt using R UE to apply deodorant. Pt also able to brush hair seated in chair. ?  ?  ?  ?  ?  ?  ?  ?  ?  ?  ?  ?  ?  ?  ?Functional mobility during ADLs: Moderate assistance;Cane ?General ADL Comments: Very slow labored movement with frequent tactile and verbal cuing. ?  ? ? ? ? ?Cognition Arousal/Alertness: Awake/alert ?Behavior During Therapy: [facility] for tasks assessed/performed ?Overall Cognitive Status: Within Functional Limits for tasks assessed ?  ?  ?  ?  ?  ?  ?  ?  ?  ?  ?  ?  ?  ?  ?  ?  ?  ?  ?  ?   ? ?   ? ?  ?   ? ? ?  ?    ? ? ?Pertinent Vitals/ Pain       Pain Assessment ?Pain Assessment: Faces ?Faces Pain Scale: Hurts little more ?Pain Location: L hip and leg ?Pain Descriptors / Indicators: Discomfort ?Pain Intervention(s): Limited activity within patient's tolerance, Monitored during session, Repositioned ? ? ?   ?  ?  ?  ?  ?  ?  ?  ?  ?  ?  ?  ?  ?  ?  ?  ?  ?  ?  ? ?  ?    ?  ?  ?  ?   ? ?Frequency ? Min 2X/week  ? ? ? ? ?  ?Progress Toward Goals ? ?OT Goals(current goals can now be found in the care plan section) ? Progress towards OT goals: Progressing toward goals ? ?Acute Rehab OT Goals ?Patient Stated Goal: return home ?OT Goal Formulation: With patient ?Time For Goal Achievement: 10/28/21 ?Potential to Achieve Goals: Good ?ADL Goals ?Pt Will Perform Grooming: with supervision;sitting ?Pt Will Perform Upper Body Dressing: with modified independence;sitting ?Pt Will Perform Lower Body Dressing: with min assist;sitting/lateral leans;with adaptive equipment ?Pt Will Transfer to  Toilet: with supervision;bedside commode;stand pivot transfer ?Pt Will Perform Toileting - Clothing Manipulation and hygiene: with min guard assist;sitting/lateral leans ?Pt/caregiver will Perform Home Exercise Program: Increased ROM;Left upper extremity;With minimal assist  ?Plan Discharge plan remains appropriate   ? ?Co-evaluation ? ? ? PT/OT/SLP Co-Evaluation/Treatment: Yes ?Reason for Co-Treatment: To address functional/ADL transfers ?  ?OT goals addressed during session: ADL's and self-care ?  ? ?  ? ? ? ?   ?  ?  ?  ?  ?  ?  ? ?  ?End of Session Equipment Utilized During Treatment: Other (comment) (L AFO, quad and single point cane.) ? ?OT Visit Diagnosis: Unsteadiness on  feet (R26.81);Other abnormalities of gait and mobility (R26.89);Muscle weakness (generalized) (M62.81);Hemiplegia and hemiparesis ?Hemiplegia - Right/Left: Left ?Hemiplegia - dominant/non-dominant: Non-Dominant ?  ?Activity Tolerance Patient tolerated treatment well ?  ?Patient Left in chair;with call bell/phone within reach;with family/visitor present ?  ?Nurse Communication   ?  ? ?   ? ?Time: [phone] ?OT Time Calculation (min): 23 min ? ?Charges: OT General Charges ?$OT Visit: 1 Visit ?OT Treatments ?$Self Care/Home Management : 8-22 mins ? ?Danie ChandlerSAMUEL Shermar Friedland OT, MOT ? ?Danie ChandlerSamuel  Xochitl Egle ?10/16/2021, 10:28 AM ?

## 2021-10-16 NOTE — Progress Notes (Signed)
?PROGRESS NOTE ? ? ? ?Melanie Spencer  VZC:[id number] DOB: 02-13-1961 DOA: 10/13/2021 ?PCP: Beatrix Fetters, MD  ? ?Brief Narrative:  ? patient is a 61 year old obese Caucasian female with a past medical history significant for but not limited to CVA, history of essential hypertension, hyperlipidemia, diabetes mellitus type 2 which is uncontrolled as well as other comorbidities who presented with a chief complaint of pain in the upper lateral aspect of her leg as well as complaints of diarrhea and not feeling well since the 28th of this month.  She states the pain started yesterday and upon further questioning in the ED she had some nausea and vomiting on Saturday and Sunday and had diarrhea several times a day for a week.  She denies any blood in her stool or abdominal pain and states that her last normal meal was the night before but she stated she was hungry.  She denies any dysuria but admits to having increased frequency and urgency.  Also notes that her last normal bowel movement was about approximately 10 days ago.  She denied any chest pain, shortness of breath or cough but states that she had pain in the left upper leg that was sharp and vague in nature and worse with movement and has been constant.  She says the pain is intense and is a severe pain and she was given fentanyl in the ED which did not relieve the pain.  Of note she does not smoke or drink alcohol and she is not vaccinated for COVID-19 disease ? ?**Interim History  ?Her diarrhea is improving her renal function is improved as well.  Her IV fluids were changed sodium bicarbonate given that her bicarbonate level was still low.  Urine is growing out 70,000 colonies of gram-negative rods which turned out to be Klebsiella Pneumoniae.  PT OT recommending SNF and after reevaluation they still recommend SNF given that she is extremely weak and is a high fall risk.  Patient's husband is unable to take care of her so she will go to SNF short-term for  rehabilitative efforts. ? ?She is now complaining of some upper thigh pain on the left and knee pain secondary to knee x-ray and start Voltaren gel. X-Ray of the Knee showed "Minimal medial and patellofemoral compartment osteoarthritis."  ? ? ?Assessment and Plan: ?* Acute metabolic encephalopathy ?-Secondary to suspected Urinary Tract infection ?-Described as being " foggy headed" ?-Multifactorial with pain medication also contributing ?-Patient getting confused during history taking, but easily reoriented ?-Likely to improve with treatment of infections ?-Delirium Precautions ?-Had some confusion this AM but is awake and oriented  ? ?Diabetes mellitus type 2 in obese [facility]) ?-Holding Trulicity, Actos ?-Continue Gabapentin 100 mg p.o. daily for her diabetic neuropathy ?-Insulin 40 units daily, continue reduced dose at 30 units ?-Currently on full liquid diet and likely will increase to Carb Modified Diet ?-Continue to Monitor CBGs; CBG's ranging from 128-182 ?-Hemoglobin A1c was 7.8 ? ?Normocytic anemia ?- Patient's hemoglobin/hematocrit has been steadily dropping from 10.3/34.7 -> 9.7/31.9 -> 9.0/30.5 -> 9.5/30.6 with an MCV of 84.8. ?-Likely has a dilutional drop with all IV fluids she was received. ?-Check anemia panel in the a.m. ?-Continue to monitor for signs and symptoms of bleeding; currently no overt bleeding noted ?-Repeat CBC in the AM ? ?Obesity (BMI 30-39.9) ?-Complicates overall prognosis and care ?-Estimated body mass index is 37.85 kg/m? as calculated from the following: ?  Height as of this encounter: 5' 6.5" (1.689 m). ?  Weight  as of this encounter: 108 kg.  ?-Weight Loss and Dietary Counseling given ? ? ?Left leg pain ?-Patient reports excruciating pain in her left upper thigh which had improved but now a little worse ?Abnormal finding on CT calls possible tumor at the -T12/L1 -recommends MRI ?-MRI Thoracic and Lumbar Spine showed "Well-circumscribed 3.0 cm lobulated T2 hyperintense, T1  hypointense,  on-enhancing mass in the right T12-L1 neural foramen with scalloping/remodeling of the adjacent right posterior T12 vertebral body. No surrounding marrow or soft tissue edema. 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 Mild multilevel thoracic and lumbar spondylosis as described above. No high-grade stenosis or impingement.  Chronic bilateral L5 pars defects with grade 1 anterolisthesis at L5-S1." ?-Will discuss with Neurosurgery  ?-Pain control with pain scale we will also add Voltaren gel ?-Left Knee X-ray showed "Minimal medial and patellofemoral compartment osteoarthritis." ?-Continue to Monitor closely ?-Will obtain PT/OT to further evaluate and Treat and recommending SNF and after re-evaluation they are still recommending SNF ? ?UTI (urinary tract infection) ?-UA indicative of UTI as it showed a hazy appearance with 50 glucose, moderate leukocytes, negative nitrites, many bacteria, 0-5 squamous epithelial cells, 21 WBCs ?-Urine culture showing >70,000 CFU of GNR and turned out to be Klebsiella Pneumoniae that was only resistant to Ampicillin ?-Previous cultures have grown Klebsiella, E. coli, Citrobacter ?The most recent culture grew Klebsiella that was sensitive to Rocephin ?-Continued IV ceftriaxone and change to po Cefdinir 300 mg po q12h ?-Continue monitor and follow cultures; unfortunately no blood cultures were sent on admission  ? ?Hyperlipidemia ?-Continue simvastatin but may need to hold given her left leg pain and questionable myopathy ?-Continue to monitor closely  ? ?AKI (acute kidney injury) (HCC) ?Metabolic Acidosis ?Hyperkalemia, Improved ?-Baseline 1.07, her BUNs/creatinine on admission was 51/1.76; Now BUN/Cr is 34/1.02 ?-BUN also elevated, bicarb decreased ?-Patient has a metabolic acidosis with a CO2 of 15, chloride level of 112, and anion gap of 8 ?-Avoid further nephrotoxic medications, contrast dyes, hypotension and renally dose medications ?-Given 1 L fluid given  in the ED ?-Patient's potassium is 5.3 and will give a dose of Lokelma 10 g x1 ?-Continue with Sodium Bicarbonate 650 mg p.o. twice daily ?-Continue IV fluids but will change to Sodium Bicarbonate 150 mEQ at 75 mL/hr ?-Likely dehydrated secondary to GI losses ?-Continue monitor and trend renal function carefully and repeat CMP in the a.m. ?  ? ?Colitis ?-Proctocolitis mild on CT abdomen pelvis ?-Diarrhea for several days but is improving  ?-Continuing Rocephin and Flagyl ?-Patient did have a bowel movement at admission, send for GI pathogen panel and C. Difficile; BOTH C Diff and GI Pathogen Panel Negative ?-Enteric precautions ?-Continued with normal saline at 75 MLS per hour but will change to Sodium Bicarbonate 150 mEQ at 75 mL/hr ?-On a full liquid diet and will go to a Carb Modified Diet today  ?  ? ?GERD (gastroesophageal reflux disease) ?-Initially Holding Protonix in the setting of AKI and her diarrhea but will resume now  ? ?Essential hypertension, benign ?-Holding ramipril in the setting of AKI ?-Continue Amlodipine 2.5 mg p.o. daily ?-Continue to monitor blood pressures per protocol ?-Last blood pressure reading was elevated at 166/77 ?  ? ?Sepsis secondary to UTI (HCC)-resolved as of 10/14/2021 ?Sepsis Ruled OUT ? ?DVT prophylaxis: heparin injection 5,000 Units Start: 10/14/21 1400 ?SCDs Start: 10/14/21 16100621 ? ?  Code Status: Full Code ?Family Communication: Discussed with Husband at bedside ? ?Disposition Plan:  ?Level of care: Telemetry ?Status is:  Inpatient ?Remains inpatient appropriate because: Needs SNF placement and TOC Workup  ? ?Consultants:  ?None ? ?Procedures:  ?None ? ?Antimicrobials:  ?Anti-infectives (From admission, onward)  ? ? Start     Dose/Rate Route Frequency Ordered Stop  ? 10/16/21 1115  cefdinir (OMNICEF) capsule 300 mg       ? 300 mg Oral Every 12 hours 10/16/21 1019    ? 10/15/21 0800  cefTRIAXone (ROCEPHIN) 1 g in sodium chloride 0.9 % 100 mL IVPB  Status:  Discontinued       ?  1 g ?200 mL/hr over 30 Minutes Intravenous Every 24 hours 10/14/21 0620 10/16/21 1019  ? 10/14/21 0345  cefTRIAXone (ROCEPHIN) 1 g in sodium chloride 0.9 % 100 mL IVPB       ? 1 g ?200 mL/hr over 30 Minutes Intraven

## 2021-10-17 LAB — COMPREHENSIVE METABOLIC PANEL
ALT: 12 U/L (ref 0–44)
AST: 15 U/L (ref 15–41)
Albumin: 3.2 g/dL — ABNORMAL LOW (ref 3.5–5.0)
Alkaline Phosphatase: 76 U/L (ref 38–126)
Anion gap: 8 (ref 5–15)
BUN: 12 mg/dL (ref 8–23)
CO2: 28 mmol/L (ref 22–32)
Calcium: 9.1 mg/dL (ref 8.9–10.3)
Chloride: 106 mmol/L (ref 98–111)
Creatinine, Ser: 0.88 mg/dL (ref 0.44–1.00)
GFR, Estimated: >60 mL/min (ref >60)
Glucose, Bld: 152 mg/dL — ABNORMAL HIGH (ref 70–99)
Potassium: 3.7 mmol/L (ref 3.5–5.1)
Sodium: 142 mmol/L (ref 135–145)
Total Bilirubin: 0.3 mg/dL (ref 0.3–1.2)
Total Protein: 6.7 g/dL (ref 6.5–8.1)

## 2021-10-17 LAB — CBC WITH DIFFERENTIAL/PLATELET
Abs Immature Granulocytes: 0.06 10*3/uL (ref 0.00–0.07)
Basophils Absolute: 0.1 10*3/uL (ref 0.0–0.1)
Basophils Relative: 1 %
Eosinophils Absolute: 0.2 10*3/uL (ref 0.0–0.5)
Eosinophils Relative: 2 %
HCT: 31.3 % — ABNORMAL LOW (ref 36.0–46.0)
Hemoglobin: 10 g/dL — ABNORMAL LOW (ref 12.0–15.0)
Immature Granulocytes: 1 %
Lymphocytes Relative: 37 %
Lymphs Abs: 2.8 10*3/uL (ref 0.7–4.0)
MCH: 26.8 pg (ref 26.0–34.0)
MCHC: 31.9 g/dL (ref 30.0–36.0)
MCV: 83.9 fL (ref 80.0–100.0)
Monocytes Absolute: 0.7 10*3/uL (ref 0.1–1.0)
Monocytes Relative: 9 %
Neutro Abs: 3.8 10*3/uL (ref 1.7–7.7)
Neutrophils Relative %: 50 %
Platelets: 333 10*3/uL (ref 150–400)
RBC: 3.73 MIL/uL — ABNORMAL LOW (ref 3.87–5.11)
RDW: 14.7 % (ref 11.5–15.5)
WBC: 7.5 10*3/uL (ref 4.0–10.5)
nRBC: 0 % (ref 0.0–0.2)

## 2021-10-17 LAB — GLUCOSE, CAPILLARY
Glucose-Capillary: 155 mg/dL — ABNORMAL HIGH (ref 70–99)
Glucose-Capillary: 192 mg/dL — ABNORMAL HIGH (ref 70–99)

## 2021-10-17 LAB — MAGNESIUM: Magnesium: 1.9 mg/dL (ref 1.7–2.4)

## 2021-10-17 LAB — PHOSPHORUS: Phosphorus: 3.1 mg/dL (ref 2.5–4.6)

## 2021-10-17 MED ORDER — RISAQUAD PO CAPS: 2.0000 | ORAL_CAPSULE | Freq: Every day | ORAL | 0 refills | Status: DC

## 2021-10-17 MED ORDER — DICLOFENAC SODIUM 1 % EX GEL: 2.0000 g | Freq: Four times a day (QID) | CUTANEOUS | Status: DC

## 2021-10-17 MED ORDER — OXYCODONE HCL 5 MG PO TABS
5.0000 mg | ORAL_TABLET | Freq: Four times a day (QID) | ORAL | 0 refills | Status: DC | PRN
Start: 2021-10-17 — End: 2022-06-09

## 2021-10-17 MED ORDER — CEFDINIR 300 MG PO CAPS: 300.0000 mg | ORAL_CAPSULE | Freq: Two times a day (BID) | ORAL | 0 refills | Status: AC

## 2021-10-17 MED ORDER — METRONIDAZOLE 500 MG PO TABS: 500.0000 mg | ORAL_TABLET | Freq: Two times a day (BID) | ORAL | 0 refills | Status: AC

## 2021-10-17 MED ORDER — NYSTATIN 100000 UNIT/GM EX POWD: Freq: Three times a day (TID) | CUTANEOUS | 0 refills | Status: DC

## 2021-10-17 MED ORDER — ACETAMINOPHEN 325 MG PO TABS: 650.0000 mg | ORAL_TABLET | Freq: Four times a day (QID) | ORAL | 0 refills | Status: DC | PRN

## 2021-10-17 MED ORDER — ONDANSETRON HCL 4 MG PO TABS: 4.0000 mg | ORAL_TABLET | Freq: Four times a day (QID) | ORAL | 0 refills | Status: DC | PRN

## 2021-10-17 NOTE — Discharge Summary (Signed)
?Physician Discharge Summary ?  ?Patient: Melanie Spencer MRN: [id number] DOB: Mar 17, 1961  ?Admit date:     10/13/2021  ?Discharge date: 10/17/21  ?Discharge Physician: Marguerita Merles, DO  ? ?PCP: Beatrix Fetters, MD  ? ?Recommendations at discharge:  ?Follow-up with PCP within 1 to 2 weeks and repeat CBC, CMP, mag, Phos within 1 week ?Follow-up with Neurosurgery in outpatient setting if necessary further grade 1 borderline grade 2 L5-S1 spondylolisthesis and chronic disc collapse with bidirectional osteophytes and severe L5-S1 vertical foraminal stenosis and the chronic appearing upper plate anterior wedge compression fractures without retropulsion of the T11 and T12 vertebral bodies ?Complete 10-day course of antibiotics ?Follow-up with orthopedic surgery in outpatient setting for DJD ? ?Discharge Diagnoses: ?Principal Problem: ?  Acute metabolic encephalopathy ?Active Problems: ?  Diabetes mellitus type 2 in obese [facility]) ?  Essential hypertension, benign ?  GERD (gastroesophageal reflux disease) ?  Colitis ?  AKI (acute kidney injury) (HCC) ?  Hyperlipidemia ?  UTI (urinary tract infection) ?  Left leg pain ?  Obesity (BMI 30-39.9) ?  Normocytic anemia ? ?Resolved Problems: ?  Sepsis secondary to UTI [facility]) ? ?Hospital Course: ?The patient is a 61 year old obese Caucasian female with a past medical history significant for but not limited to CVA, history of essential hypertension, hyperlipidemia, diabetes mellitus type 2 which is uncontrolled as well as other comorbidities who presented with a chief complaint of pain in the upper lateral aspect of her leg as well as complaints of diarrhea and not feeling well since the 28th of this month.  She states the pain started yesterday and upon further questioning in the ED she had some nausea and vomiting on Saturday and Sunday and had diarrhea several times a day for a week.  She denies any blood in her stool or abdominal pain and states that her last normal meal was the night  before but she stated she was hungry.  She denies any dysuria but admits to having increased frequency and urgency.  Also notes that her last normal bowel movement was about approximately 10 days ago.  She denied any chest pain, shortness of breath or cough but states that she had pain in the left upper leg that was sharp and vague in nature and worse with movement and has been constant.  She says the pain is intense and is a severe pain and she was given fentanyl in the ED which did not relieve the pain.  Of note she does not smoke or drink alcohol and she is not vaccinated for COVID-19 disease ? ?**Interim History  ?Her diarrhea is improving her renal function is improved as well.  Her IV fluids were changed sodium bicarbonate given that her bicarbonate level was still low.  Urine is growing out 70,000 colonies of gram-negative rods which turned out to be Klebsiella Pneumoniae.  PT OT recommending SNF and after reevaluation they still recommend SNF given that she is extremely weak and is a high fall risk.  Patient's husband is unable to take care of her so she will go to SNF short-term for rehabilitative efforts. ? ?She is now complaining of some upper thigh pain on the left and knee pain secondary to knee x-ray and start Voltaren gel. X-Ray of the Knee showed "Minimal medial and patellofemoral compartment osteoarthritis." ? ?The case was discussed with Dr. Conchita Paris of neurosurgery who feels she needs no further inpatient work-up and recommends outpatient follow-up as necessary and continued rehabilitative efforts and pain control.  Her labs are all stable and she has been deemed medically stable for discharge at this time will need to continue antibiotic course to completion and follow-up with PCP, neurosurgery if necessary and orthopedic surgery in outpatient setting.  ? ?Assessment and Plan: ?* Acute metabolic encephalopathy ?-Secondary to suspected Urinary Tract infection ?-Described as being " foggy  headed" ?-Multifactorial with pain medication also contributing ?-Patient getting confused during history taking, but easily reoriented ?-Likely to improve with treatment of infections and she is improving slowly ?-Delirium Precautions ?-Had some intermittent confusion but is much improved since admission ? ?Diabetes mellitus type 2 in obese [facility](HCC) ?-Holding Trulicity, Actos ?-Continue Gabapentin 100 mg p.o. daily for her diabetic neuropathy ?-Insulin 40 units daily, continue reduced dose at 30 units ?-Now on a carb modified diet ?-Continue to Monitor CBGs; CBG's ranging from 126-201 ?-Hemoglobin A1c was 7.8 ? ?Normocytic anemia ?- Patient's hemoglobin/hematocrit has been steadily dropping from 10.3/34.7 -> 9.7/31.9 -> 9.0/30.5 -> 9.5/30.6 -> 10.0/31.3 with an MCV of 83.9. ?-Likely has a dilutional drop with all IV fluids she was received. ?-Check anemia panel in the outpatient setting ?-Continue to monitor for signs and symptoms of bleeding; currently no overt bleeding noted ?-Repeat CBC within 1 week ? ?Obesity (BMI 30-39.9) ?-Complicates overall prognosis and care ?-Estimated body mass index is 37.85 kg/m? as calculated from the following: ?  Height as of this encounter: 5' 6.5" (1.689 m). ?  Weight as of this encounter: 108 kg.  ?-Weight Loss and Dietary Counseling given ? ? ?Left leg pain ?-Patient reports excruciating pain in her left upper thigh which had improved but now a little worse yesterday ?-Abnormal finding on CT calls possible tumor at the -T12/L1 -recommends MRI ?-MRI Thoracic and Lumbar Spine showed "Well-circumscribed 3.0 cm lobulated T2 hyperintense, T1 hypointense,  on-enhancing mass in the right T12-L1 neural foramen with scalloping/remodeling of the adjacent right posterior T12 vertebral body. No surrounding marrow or soft tissue edema. 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 Mild multilevel thoracic and lumbar spondylosis as described above. No high-grade stenosis or impingement.   Chronic bilateral L5 pars defects with grade 1 anterolisthesis at L5-S1." ?-Will discuss with Neurosurgery and I spoke to Dr. Conchita ParisNundkumar personally today who feels that she needs no further inpatient work-up and feels that she can be discharged safely to rehab and followed in the outpatient setting with neurosurgery for her CT scan findings ?-Pain control with pain scale we will also add Voltaren gel ?-Left Knee X-ray showed "Minimal medial and patellofemoral compartment osteoarthritis." ?-Continue to Monitor closely ?-Will obtain PT/OT to further evaluate and Treat and recommending SNF and after re-evaluation they are still recommending SNF so [facility] given that she is medically stable ? ?UTI (urinary tract infection) ?-UA indicative of UTI as it showed a hazy appearance with 50 glucose, moderate leukocytes, negative nitrites, many bacteria, 0-5 squamous epithelial cells, 21 WBCs ?-Urine culture showing >70,000 CFU of GNR and turned out to be Klebsiella Pneumoniae that was only resistant to Ampicillin ?-Previous cultures have grown Klebsiella, E. coli, Citrobacter ?The most recent culture grew Klebsiella that was sensitive to Rocephin ?-Continued IV ceftriaxone and change to po Cefdinir 300 mg po q12h and will continue antibiotic course but because she also has a colitis noted we will continue antibiotics for 10 days total with the addition of probiotics ?-Continue monitor and follow cultures; unfortunately no blood cultures were sent on admission  ? ?Hyperlipidemia ?-Continue simvastatin but may need to  hold given her left leg pain and questionable myopathy ?-Continue to monitor closely and if continues to have upper thigh pain recommend holding the statin in the outpatient setting ? ?AKI (acute kidney injury) (HCC) ?Metabolic Acidosis ?Hyperkalemia, Improved ?-Baseline 1.07, her BUNs/creatinine on admission was 51/1.76; Now BUN/Cr is improved and at baseline at 12/0.80 ?-BUN also  elevated, bicarb decreased ?-Patient had a metabolic acidosis with a CO2 of 15, chloride level of 112, and anion gap of 8; now CO2 is 28, chloride level is 106, anion gap is 8 ?-Avoid further nephrotoxic medi

## 2021-10-17 NOTE — Progress Notes (Signed)
Patient discharged to [facility], transported by EMS to facility. Discharge paperwork placed in packet to give to facility. Report called to [facility] LPN at facility. Belongings sent with husband to bring to facility per patient request.  ?

## 2021-10-17 NOTE — Progress Notes (Signed)
CSW spoke with Eunice Blase at [facility] who states the facility can accept the patient today.  ? ?Patient will go to room #A17-1. Patient will be transported via EMS, transport has been scheduled for pick up as first available. The number to call for report is [phone]. ? ?CSW informed RN of discharge plan. ? ?Edwin Dada, MSW, LCSW ?Transitions of Care  Clinical Social Worker II ?[phone] ? ?

## 2021-11-05 ENCOUNTER — Ambulatory Visit: Payer: Medicare PPO | Admitting: Podiatry

## 2021-11-05 DIAGNOSIS — E1149 Type 2 diabetes mellitus with other diabetic neurological complication: Secondary | ICD-10-CM

## 2021-11-05 DIAGNOSIS — M79674 Pain in right toe(s): Secondary | ICD-10-CM | POA: Diagnosis not present

## 2021-11-05 DIAGNOSIS — M2032 Hallux varus (acquired), left foot: Secondary | ICD-10-CM

## 2021-11-05 DIAGNOSIS — M79675 Pain in left toe(s): Secondary | ICD-10-CM | POA: Diagnosis not present

## 2021-11-05 DIAGNOSIS — B351 Tinea unguium: Secondary | ICD-10-CM | POA: Diagnosis not present

## 2021-11-05 DIAGNOSIS — M2041 Other hammer toe(s) (acquired), right foot: Secondary | ICD-10-CM

## 2021-11-05 DIAGNOSIS — M2042 Other hammer toe(s) (acquired), left foot: Secondary | ICD-10-CM

## 2021-11-05 DIAGNOSIS — Z899 Acquired absence of limb, unspecified: Secondary | ICD-10-CM

## 2021-11-08 NOTE — Progress Notes (Signed)
Subjective: ?61 y.o. returns the office today for painful, elongated, thickened toenails which she cannot trim herself.  Denies any swelling or redness or any open sores.  She is continue with the brace on the left side which has been fitting well.  Asking about new diabetic shoes today.  She has no new concerns today.  No fevers or chills.   ? ?PCP: Beatrix Fetters, MD-last seen October 23, 2021 ?Last A1c was 7.8 on October 15, 2021 ? 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 ?Nails hypertrophic, dystrophic, elongated, brittle, discolored ?9. There is tenderness overlying the nails 1-5 bilaterally. There is no surrounding erythema or drainage along the nail sites.  Right hallux nail has some mild subungual hematoma still evident but appears to be improving.  There is incurvation of the nail border.  I was able to debride some the loose dried blood today. ?Incision for the right foot surgery is well-healed with a scar no new ulcerations are noted. ?No pain with calf compression, swelling, warmth, erythema. ? ?Assessment: ?Patient presents with symptomatic onychomycosis ? ?Plan: ?-Treatment options including alternatives, risks, complications were discussed ?-Nails sharply debrided ?9 without complication/bleeding.  Monitor the right hallux toenail for the subungual hematoma.  Monitoring signs or symptoms of infection. ?-Order for new diabetic shoes written today.  She will follow-up with Arlys John for this. ?-Discussed daily foot inspection. If there are any changes, to call the office immediately.  ?-Follow-up in 3 months or sooner if any problems are to arise. In the meantime, encouraged to call the office with any questions, concerns, changes symptoms. ? ?Ovid Curd, DPM ? ? ?

## 2021-11-10 ENCOUNTER — Other Ambulatory Visit: Payer: Medicare PPO

## 2021-11-12 ENCOUNTER — Other Ambulatory Visit: Payer: Medicare PPO

## 2021-11-17 ENCOUNTER — Ambulatory Visit: Payer: Medicare PPO

## 2021-11-17 DIAGNOSIS — Z899 Acquired absence of limb, unspecified: Secondary | ICD-10-CM

## 2021-11-17 DIAGNOSIS — E1149 Type 2 diabetes mellitus with other diabetic neurological complication: Secondary | ICD-10-CM

## 2021-11-17 DIAGNOSIS — M2041 Other hammer toe(s) (acquired), right foot: Secondary | ICD-10-CM

## 2021-11-17 NOTE — Progress Notes (Signed)
SITUATION ?Reason for Consult: Evaluation for Prefabricated Diabetic Shoes and Custom Diabetic Inserts. ?Patient / Caregiver Report: Patient would like well fitting shoes ? ?OBJECTIVE DATA: ?Patient History / Diagnosis:  ?  ICD-10-CM   ?1. Type II diabetes mellitus with neurological manifestations (HCC)  E11.49   ?  ?2. Hammertoes of both feet  M20.41   ? M20.42   ?  ?3. History of amputation  Z89.9   ?  ? ? ?Physician Treating Diabetes:  Beatrix Fetters, MD ? ?Current or Previous Devices:   Current user ? ?In-Person Foot Examination: ?Ulcers & Callousing:   Historical ?Deformities:    Amputation of toes, Hammertoes ?Sensation:    Compromised  ?Shoe Size:     11W ? ?ORTHOTIC RECOMMENDATION ?Recommended Devices: ?- 1x pair prefabricated PDAC approved diabetic shoes; Patient Selected Orthofeet [address] Grey Size 11W ?- 3x pair custom-to-patient PDAC approved vacuum formed diabetic insoles. ? ?GOALS OF SHOES AND INSOLES ?- Reduce shear and pressure ?- Reduce / Prevent callus formation ?- Reduce / Prevent ulceration ?- Protect the fragile healing compromised diabetic foot. ? ?Patient would benefit from diabetic shoes and inserts as patient has diabetes mellitus and the patient has one or more of the following conditions: ?- History of partial or complete amputation of the foot ?- History of previous foot ulceration. ?- History of pre-ulcerative callus ?- Peripheral neuropathy with evidence of callus formation ?- Foot deformity ?- Poor circulation ? ?ACTIONS PERFORMED ?Potential out of pocket cost was communicated to patient. Patient understood and consented to measurement and casting. Patient was casted for insoles via crush box and measured for shoes via brannock device. Procedure was explained and patient tolerated procedure well. All questions were answered and concerns addressed. Casts were shipped to central fabrication for HOLD until Certificate of Medical Necessity or otherwise necessary authorization  from insurance is obtained. ? ?PLAN ?Shoes are to be ordered and casts released from hold once all appropriate paperwork is complete. Patient is to be contacted and scheduled for fitting once shoes and insoles have been fabricated and received. ? ?

## 2021-11-28 ENCOUNTER — Other Ambulatory Visit (INDEPENDENT_AMBULATORY_CARE_PROVIDER_SITE_OTHER): Payer: Self-pay | Admitting: Nurse Practitioner

## 2021-12-07 ENCOUNTER — Ambulatory Visit: Payer: Medicare PPO | Admitting: Physician Assistant

## 2021-12-07 ENCOUNTER — Telehealth: Payer: Self-pay

## 2021-12-07 VITALS — BP 191/75 | HR 83 | Ht 66.5 in | Wt 243.0 lb

## 2021-12-07 DIAGNOSIS — R32 Unspecified urinary incontinence: Secondary | ICD-10-CM | POA: Diagnosis not present

## 2021-12-07 DIAGNOSIS — R8271 Bacteriuria: Secondary | ICD-10-CM

## 2021-12-07 DIAGNOSIS — N3281 Overactive bladder: Secondary | ICD-10-CM | POA: Diagnosis not present

## 2021-12-07 DIAGNOSIS — Z8619 Personal history of other infectious and parasitic diseases: Secondary | ICD-10-CM

## 2021-12-07 LAB — MICROSCOPIC EXAMINATION
RBC, Urine: NONE SEEN /hpf (ref 0–2)
Renal Epithel, UA: NONE SEEN /hpf

## 2021-12-07 LAB — URINALYSIS, ROUTINE W REFLEX MICROSCOPIC
Bilirubin, UA: NEGATIVE
Leukocytes,UA: NEGATIVE
Nitrite, UA: NEGATIVE
RBC, UA: NEGATIVE
Specific Gravity, UA: >1.03 — ABNORMAL HIGH (ref 1.005–1.030)
Urobilinogen, Ur: 0.2 mg/dL (ref 0.2–1.0)
pH, UA: 5.5 (ref 5.0–7.5)

## 2021-12-07 LAB — BLADDER SCAN AMB NON-IMAGING: Scan Result: 0

## 2021-12-07 MED ORDER — MIRABEGRON ER 25 MG PO TB24: 25.0000 mg | ORAL_TABLET | Freq: Every day | ORAL | 0 refills | Status: DC

## 2021-12-07 NOTE — Progress Notes (Addendum)
12/07/2021 3:53 PM   Melanie Spencer Cogburn 1961/01/16 [id number]   Assessment:  1. Urinary incontinence, unspecified type - Urinalysis, Routine w reflex microscopic - BLADDER SCAN AMB NON-IMAGING  2. OAB (overactive bladder)  3. Bacteriuria  4. History of sepsis    Plan: Samples of Myrbetriq 25 mg given.  Will send in a prescription if they are effective.  Discussed findings and plan of care at length with the patient.  Due to her history of CVA in 2007, she may need urodynamics in the future to assess whether catheterization is indicated.  If she develops symptoms of UTI again, she is asked to come in for appointment for assessment and culture.  No additional antibiotics indicated today. Mdx cx ordered. Also discussed association between elevated glucose levels and her recent A1c of 7.8 that will contribute to urinary frequency and urgency.  She has planned follow-up with her primary care provider for follow-up of her other issues including acute kidney injury diagnosed during hospitalization. FU here in 6-8 weeks for UA and PVR.  Chief Complaint: Incontinence and follow-up after hospital admission.  Referring provider: Beatrix FettersKotturi, Vinay K, MD [address] [place],  [place] [id number]   History of Present Illness:  Melanie Spencer Riedlinger is a 61 y.o. year old female with a history of diabetes mellitus, stroke, hypertension who is seen in consultation from Beatrix FettersKotturi, Vinay K, MD for evaluation of recent UTI with admission on 10/13/2021 for diagnosis of urosepsis.  Urine culture revealed greater than 70,000 colonies of Klebsiella pneumonia resistant only to ampicillin.  Patient was treated with IV Rocephin and discharged with cefdinir to complete a 10-day course.  CT of the abdomen pelvis revealed no evidence of urinary stone disease.  No obstructive uropathy appreciated. The patient was discharged on 10/17/2021 and referred to us for FU. Currently, she is with her husband who assists with history and states that  she is having significant issues with urge incontinence, occasional stress incontinence.  She is going through multiple pads daily and soaks depends during the night.  This issue has been ongoing for several years since her CVA in 2007, but seem to resolve somewhat.  For the past several years and months, symptoms have worsened again.  She denies burning, dysuria, gross hematuria.  Since recent admission and treatment for UTI, she continues to have urinary frequency, urgency, intermittency, occasional difficulty starting stream.  She has had several UTIs in the past, but none over the past year.  She has also been diagnosed with overactive bladder in the past and is on Toviaz for her symptoms without improvement.  She denies constipation, but has had significant issues diarrhea in the past.  She is independently ambulatory with the assistance of her spouse due to left-sided weakness.  She has no use of her left upper extremity.  She is working toward better control of diabetes.  Most recent hemoglobin A1c was 7.8.  No history of renal failure.  No history of stone disease. PVR=700ml UA= 0-5 WBCs, few bacteria, nitrite negative  Cx 11/10/21=mixed urogen flora 10-25 colonies 01/20/21=Klebsiella pneumoniae 100,000 colonies, resistant only to ampicillin 09/26/2020 = E. coli, and 100,000 colonies, multiresistance  Medical records, imaging, and lab reports personally reviewed during patient's office visit  Past Medical History:  Past Medical History:  Diagnosis Date   Cerebrovascular disease 05/23/2019   Essential hypertension, benign 05/23/2019   History of stroke 2007   left leg weakness   HLD (hyperlipidemia) 05/23/2019   Morbid obesity (HCC) 05/23/2019  Type II diabetes mellitus, uncontrolled 05/23/2019   Vitamin D deficiency     Past Surgical History:  Past Surgical History:  Procedure Laterality Date   AMPUTATION Right 11/09/2019   Procedure: 5TH AMPUTATION RAY;  Surgeon: Vivi Barrack,  DPM;  Location: [facility] OR;  Service: Podiatry;  Laterality: Right;   CHOLECYSTECTOMY     COLONOSCOPY N/A 11/12/2020   Procedure: COLONOSCOPY;  Surgeon: Corbin Ade, MD;  [facility]: [facility];  Service: Endoscopy;  Laterality: N/A;  ASA III / PM procedure   POLYPECTOMY  11/12/2020   Procedure: POLYPECTOMY;  Surgeon: Corbin Ade, MD;  [facility]: [facility];  Service: Endoscopy;;    Allergies:  No Known Allergies  Family History:  Family History  Problem Relation Age of Onset   Heart disease Mother    Diabetes Mother    Hypertension Mother    Cancer Father        Lung   Hypertension Sister    Macular degeneration Sister    Appendicitis Brother    Hypertension Sister    Diabetes Sister     Social History:  Social History   Tobacco Use   Smoking status: Former    Packs/day: 2.00    Years: 20.00    Pack years: 40.00    Types: Cigarettes    Quit date: 07/26/2005    Years since quitting: 16.3   Smokeless tobacco: Never  Vaping Use   Vaping Use: Never used  Substance Use Topics   Alcohol use: No    Alcohol/week: 0.0 standard drinks   Drug use: No    Review of symptoms:  Constitutional:  Negative for unexplained weight loss, night sweats, fever, chills ENT:  Negative for nose bleeds, sinus pain, painful swallowing CV:  Negative for chest pain, shortness of breath Resp:  Negative for cough, wheezing, shortness of breath GI:  Negative for nausea, vomiting GU:  Positives noted in HPI; otherwise negative for gross hematuria Neuro: Positive for poor balance, limb weakness, slurred speech Psych:  Negative for lack of energy, depression, anxiety Endocrine:  Negative for polydipsia, polyuria, symptoms of hypoglycemia (dizziness, hunger, sweating) Hematologic:  Negative for anemia, purpura, petechia,  Physical Exam: BP (!) 191/75   Pulse 83   Ht 5' 6.5" (1.689 m)   Wt 243 lb (110.2 kg)   LMP 03/02/2012   BMI 38.63 kg/m   Constitutional:  Alert and oriented, No  acute distress.  HEENT: [place]AT, moist mucus membranes.  Trachea midline, no masses. Cardiovascular: Regular rate and rhythm without murmur, rub, or gallops No clubbing, cyanosis, or edema Respiratory: Normal respiratory effort, clear to auscultation bilaterally Abdomen is soft, nontender, nondistended, no abdominal masses Skin: No obvious rashes, warm, dry, intact Neurologic: Alert and oriented, flexion contractures noted left upper extremity.  Patient has obvious antalgia with her gait but ambulates with assistance of her husband.  Speech pattern easily understandable Psychiatric: Appropriate. Normal mood and affect.  Laboratory Data: No results found for this or any previous visit (from the past 24 hour(s)).  Lab Results  Component Value Date   WBC 7.5 10/17/2021   HGB 10.0 (L) 10/17/2021   HCT 31.3 (L) 10/17/2021   MCV 83.9 10/17/2021   PLT 333 10/17/2021    Lab Results  Component Value Date   CREATININE 0.88 10/17/2021   Lab Results  Component Value Date   HGBA1C 7.8 (H) 10/15/2021    Urinalysis    Component Value Date/Time   COLORURINE YELLOW 10/14/2021 0319  APPEARANCEUR HAZY (A) 10/14/2021 0319   LABSPEC 1.026 10/14/2021 0319   PHURINE 5.0 10/14/2021 0319   GLUCOSEU 50 (A) 10/14/2021 0319   HGBUR NEGATIVE 10/14/2021 0319   BILIRUBINUR NEGATIVE 10/14/2021 0319   KETONESUR NEGATIVE 10/14/2021 0319   PROTEINUR 30 (A) 10/14/2021 0319   NITRITE NEGATIVE 10/14/2021 0319   LEUKOCYTESUR MODERATE (A) 10/14/2021 0319    Lab Results  Component Value Date   BACTERIA MANY (A) 10/14/2021    Pertinent Imaging: No results found for this or any previous visit.  No results found for this or any previous visit.     Summerlin, Regan Rakers, PA-C [facility] [place]

## 2021-12-07 NOTE — Telephone Encounter (Signed)
Casts released from fabrication hold ? ?Shoes ordered - Madelyn Flavors [id number] ?

## 2021-12-07 NOTE — Progress Notes (Signed)
post void residual =0mL 

## 2021-12-10 ENCOUNTER — Telehealth: Payer: Self-pay

## 2021-12-10 ENCOUNTER — Other Ambulatory Visit: Payer: Self-pay | Admitting: Physician Assistant

## 2021-12-10 NOTE — Telephone Encounter (Signed)
-----   Message from Sydnee Levans, [place] sent at 12/10/2021 12:15 PM EDT ----- Please let pt know that her Mdx cx is negative for bacterial growth. No antibx indicated. ----- Message ----- From: Karlton Lemon Sent: 12/10/2021  10:45 AM EDT To: Sydnee Levans, PA-C

## 2021-12-10 NOTE — Telephone Encounter (Signed)
Patient aware and voiced understanding

## 2021-12-17 ENCOUNTER — Telehealth: Payer: Self-pay

## 2021-12-17 NOTE — Telephone Encounter (Signed)
Patient called stating she has seen no changes with the myrbetriq. She continues to have leakage and incontinence at night. Patient informed that it can take 4 weeks or more to see improvement of symptoms and to continue medication until her f/u in June.

## 2021-12-22 ENCOUNTER — Telehealth: Payer: Self-pay

## 2021-12-22 NOTE — Telephone Encounter (Signed)
Patients spouse called advising that the medication she was trying was not helping with patients symptoms. He advised that at patients last appt yall discussed a previous medication she was on and wanted to know if that medication could be sent to patients Pharmacy.   Pharmacy: [facility] [phone] - , [place] - [address] AT [facility] [address]

## 2021-12-23 NOTE — Telephone Encounter (Signed)
Patient will try Gemtesa sameples, 1 month supply at front desk for her to pick up.  She did not see much improvement with Toviaz.

## 2022-01-04 ENCOUNTER — Other Ambulatory Visit: Payer: Medicare PPO

## 2022-01-12 ENCOUNTER — Ambulatory Visit: Payer: Medicare PPO

## 2022-01-12 DIAGNOSIS — M2041 Other hammer toe(s) (acquired), right foot: Secondary | ICD-10-CM

## 2022-01-12 DIAGNOSIS — M2042 Other hammer toe(s) (acquired), left foot: Secondary | ICD-10-CM

## 2022-01-12 DIAGNOSIS — E1149 Type 2 diabetes mellitus with other diabetic neurological complication: Secondary | ICD-10-CM

## 2022-01-12 NOTE — Progress Notes (Signed)
SITUATION Reason for Visit: Fitting of Diabetic Shoes & Insoles Patient / Caregiver Report:  Patient is satisfied with fit and function of shoes and insoles.  OBJECTIVE DATA: Patient History / Diagnosis:     ICD-10-CM   1. Type II diabetes mellitus with neurological manifestations (HCC)  E11.49     2. Hammertoes of both feet  M20.41    M20.42       Change in Status:   None  ACTIONS PERFORMED: In-Person Delivery, patient was fit with: - 1x pair [id number] PDAC approved prefabricated Diabetic Shoes: Orthofeet [address] grey 11W - 3x pair [id number] PDAC approved vacuum formed custom diabetic insoles; [employer]: [id number]  Shoes and insoles were verified for structural integrity and safety. Patient wore shoes and insoles in office. Skin was inspected and free of areas of concern after wearing shoes and inserts. Shoes and inserts fit properly. Patient / Caregiver provided with ferbal instruction and demonstration regarding donning, doffing, wear, care, proper fit, function, purpose, cleaning, and use of shoes and insoles ' and in all related precautions and risks and benefits regarding shoes and insoles. Patient / Caregiver was instructed to wear properly fitting socks with shoes at all times. Patient was also provided with verbal instruction regarding how to report any failures or malfunctions of shoes or inserts, and necessary follow up care. Patient / Caregiver was also instructed to contact physician regarding change in status that may affect function of shoes and inserts.   Patient / Caregiver verbalized undersatnding of instruction provided. Patient / Caregiver demonstrated independence with proper donning and doffing of shoes and inserts.  PLAN Patient to follow with treating physician as recommended. Plan of care was discussed with and agreed upon by patient and/or caregiver. All questions were answered and concerns addressed.

## 2022-01-13 ENCOUNTER — Other Ambulatory Visit: Payer: Medicare PPO | Admitting: Urology

## 2022-01-19 ENCOUNTER — Telehealth: Payer: Self-pay

## 2022-01-19 NOTE — Telephone Encounter (Signed)
Patient came by office.  1 month supply of Myrbetriq given per last office note.  Patient aware rx can be sent after her apt with Dr. Ronne Binning on 02/01/2022.

## 2022-01-19 NOTE — Telephone Encounter (Signed)
Patient left a voice message 01-19-2022.  She is currently out of Gemtesa samples.  Would like more.  Please advise.  Call back:  [phone]  Thanks, Rosey Bath

## 2022-02-01 ENCOUNTER — Ambulatory Visit (INDEPENDENT_AMBULATORY_CARE_PROVIDER_SITE_OTHER): Payer: Medicare PPO | Admitting: Urology

## 2022-02-01 ENCOUNTER — Encounter: Payer: Self-pay | Admitting: Urology

## 2022-02-01 ENCOUNTER — Other Ambulatory Visit: Payer: Medicare PPO | Admitting: Urology

## 2022-02-01 VITALS — BP 141/55 | HR 59

## 2022-02-01 DIAGNOSIS — R32 Unspecified urinary incontinence: Secondary | ICD-10-CM

## 2022-02-01 DIAGNOSIS — N3281 Overactive bladder: Secondary | ICD-10-CM | POA: Diagnosis not present

## 2022-02-01 LAB — MICROSCOPIC EXAMINATION
Renal Epithel, UA: NONE SEEN /hpf
WBC, UA: >30 /hpf — AB (ref 0–5)

## 2022-02-01 LAB — URINALYSIS, ROUTINE W REFLEX MICROSCOPIC
Bilirubin, UA: NEGATIVE
Ketones, UA: NEGATIVE
Nitrite, UA: NEGATIVE
Specific Gravity, UA: >1.03 — ABNORMAL HIGH (ref 1.005–1.030)
Urobilinogen, Ur: 0.2 mg/dL (ref 0.2–1.0)
pH, UA: 5 (ref 5.0–7.5)

## 2022-02-01 LAB — BLADDER SCAN AMB NON-IMAGING: Scan Result: 13

## 2022-02-01 MED ORDER — TOLTERODINE TARTRATE ER 4 MG PO CP24: 4.0000 mg | ORAL_CAPSULE | Freq: Every day | ORAL | 11 refills | Status: DC

## 2022-02-01 MED ORDER — CIPROFLOXACIN HCL 500 MG PO TABS: 500.0000 mg | ORAL_TABLET | Freq: Once | ORAL | Status: DC

## 2022-02-01 NOTE — Progress Notes (Signed)
02/01/2022 2:19 PM   Melanie Spencer [birth year] [id number]  Referring provider: Beatrix Fetters, MD [address] [place],  [place] [address]  Followup OAB   HPI: [name] is a 60yo here for followup for urge incontinence and OAB. She originally was prescribed Gemtesa 75mg  daily which did improve her urgency but not to her satisfaction. She was then tried on mirabegron which made no improvement in her urinary urgency and urge incontinence. She had a CVA in 2007 which is when her urinary urgency and urge incontinence. Urine stream strong. No straining to urinate. NO dysuria or hematuria. She uses 5-6 diapers per day.    PMH: Past Medical History:  Diagnosis Date   Cerebrovascular disease 05/23/2019   Essential hypertension, benign 05/23/2019   History of stroke 2007   left leg weakness   HLD (hyperlipidemia) 05/23/2019   Morbid obesity (HCC) 05/23/2019   Type II diabetes mellitus, uncontrolled 05/23/2019   Vitamin D deficiency     Surgical History: Past Surgical History:  Procedure Laterality Date   AMPUTATION Right 11/09/2019   Procedure: 5TH AMPUTATION RAY;  Surgeon: 11/11/2019, DPM;  Location: [facility] OR;  Service: Podiatry;  Laterality: Right;   CHOLECYSTECTOMY     COLONOSCOPY N/A 11/12/2020   Procedure: COLONOSCOPY;  Surgeon: 11/14/2020, MD;  [facility]: [facility];  Service: Endoscopy;  Laterality: N/A;  ASA III / PM procedure   POLYPECTOMY  11/12/2020   Procedure: POLYPECTOMY;  Surgeon: 11/14/2020, MD;  [facility]: [facility];  Service: Endoscopy;;    Home Medications:  Allergies as of 02/01/2022   No Known Allergies      Medication List        Accurate as of February 01, 2022  2:19 PM. If you have any questions, ask your nurse or doctor.          Accu-Chek Guide test strip Generic drug: glucose blood TEST FOUR TIMES A DAY   acetaminophen 325 MG tablet Commonly known as: TYLENOL Take 2 tablets (650 mg total) by mouth every 6 (six) hours as needed  for mild pain (or Fever >/= 101).   acidophilus Caps capsule Take 2 capsules by mouth daily.   amLODipine 2.5 MG tablet Commonly known as: NORVASC Take 5 mg by mouth daily.   aspirin 325 MG tablet Take 325 mg by mouth daily.   cholecalciferol 1000 units tablet Commonly known as: VITAMIN D Take 1,000 Units by mouth daily.   diclofenac Sodium 1 % Gel Commonly known as: VOLTAREN Apply 2 g topically 4 (four) times daily.   fluticasone 50 MCG/ACT nasal spray Commonly known as: FLONASE SHAKE LIQUID AND USE 2 SPRAYS IN EACH NOSTRIL EVERY DAY What changed: See the new instructions.   gabapentin 300 MG capsule Commonly known as: NEURONTIN Take 600 mg by mouth 2 (two) times daily.   hydrOXYzine 25 MG tablet Commonly known as: ATARAX Take by mouth.   Insulin Pen Needle 29G X 12.7MM Misc 1 Units by Does not apply route daily at 12 noon.   Lantus SoloStar 100 UNIT/ML Solostar Pen Generic drug: insulin glargine ADMINISTER 40 UNITS UNDER THE SKIN AT BEDTIME What changed:  how much to take how to take this when to take this additional instructions   metFORMIN 1000 MG tablet Commonly known as: GLUCOPHAGE Take 1 tablet (1,000 mg total) by mouth 2 (two) times daily.   mirabegron ER 25 MG Tb24 tablet Commonly known as: MYRBETRIQ Take 1 tablet (25 mg total)  by mouth daily.   nystatin powder Commonly known as: MYCOSTATIN/NYSTOP Apply topically 3 (three) times daily.   omeprazole 20 MG capsule Commonly known as: PRILOSEC Take 1 capsule by mouth daily.   ondansetron 4 MG tablet Commonly known as: ZOFRAN Take 1 tablet (4 mg total) by mouth every 6 (six) hours as needed for nausea.   oxyCODONE 5 MG immediate release tablet Commonly known as: Oxy IR/ROXICODONE Take 1 tablet (5 mg total) by mouth every 6 (six) hours as needed for moderate pain.   pioglitazone 15 MG tablet Commonly known as: ACTOS Take 1 tablet by mouth daily.   ramipril 10 MG capsule Commonly known as:  ALTACE TAKE 2 CAPSULES(20 MG) BY MOUTH DAILY What changed: See the new instructions.   simvastatin 40 MG tablet Commonly known as: ZOCOR Take 1 tablet by mouth at bedtime.   Trulicity 4.5 MG/0.5ML Sopn Generic drug: Dulaglutide Inject 4.5 mg as directed once a week.        Allergies: No Known Allergies  Family History: Family History  Problem Relation Age of Onset   Heart disease Mother    Diabetes Mother    Hypertension Mother    Cancer Father        Lung   Hypertension Sister    Macular degeneration Sister    Appendicitis Brother    Hypertension Sister    Diabetes Sister     Social History:  reports that she quit smoking about 16 years ago. Her smoking use included cigarettes. She has a 40.00 pack-year smoking history. She has never used smokeless tobacco. She reports that she does not drink alcohol and does not use drugs.  ROS: All other review of systems were reviewed and are negative except what is noted above in HPI  Physical Exam: BP (!) 141/55   Pulse (!) 59   LMP 03/02/2012   Constitutional:  Alert and oriented, No acute distress. HEENT: [place] AT, moist mucus membranes.  Trachea midline, no masses. Cardiovascular: No clubbing, cyanosis, or edema. Respiratory: Normal respiratory effort, no increased work of breathing. GI: Abdomen is soft, nontender, nondistended, no abdominal masses GU: No CVA tenderness.  Lymph: No cervical or inguinal lymphadenopathy. Skin: No rashes, bruises or suspicious lesions. Neurologic: Grossly intact, no focal deficits, moving all 4 extremities. Psychiatric: Normal mood and affect.  Laboratory Data: Lab Results  Component Value Date   WBC 7.5 10/17/2021   HGB 10.0 (L) 10/17/2021   HCT 31.3 (L) 10/17/2021   MCV 83.9 10/17/2021   PLT 333 10/17/2021    Lab Results  Component Value Date   CREATININE 0.88 10/17/2021    No results found for: "PSA"  No results found for: "TESTOSTERONE"  Lab Results  Component Value Date    HGBA1C 7.8 (H) 10/15/2021    Urinalysis    Component Value Date/Time   COLORURINE YELLOW 10/14/2021 0319   APPEARANCEUR Clear 12/07/2021 1553   LABSPEC 1.026 10/14/2021 0319   PHURINE 5.0 10/14/2021 0319   GLUCOSEU 1+ (A) 12/07/2021 1553   HGBUR NEGATIVE 10/14/2021 0319   BILIRUBINUR Negative 12/07/2021 1553   KETONESUR NEGATIVE 10/14/2021 0319   PROTEINUR 2+ (A) 12/07/2021 1553   PROTEINUR 30 (A) 10/14/2021 0319   NITRITE Negative 12/07/2021 1553   NITRITE NEGATIVE 10/14/2021 0319   LEUKOCYTESUR Negative 12/07/2021 1553   LEUKOCYTESUR MODERATE (A) 10/14/2021 0319    Lab Results  Component Value Date   LABMICR See below: 12/07/2021   WBCUA 0-5 12/07/2021   LABEPIT 0-10 12/07/2021  MUCUS Present 12/07/2021   BACTERIA Few 12/07/2021    Pertinent Imaging:  No results found for this or any previous visit.  No results found for this or any previous visit.  No results found for this or any previous visit.  No results found for this or any previous visit.  No results found for this or any previous visit.  No results found for this or any previous visit.  No results found for this or any previous visit.  No results found for this or any previous visit.   Assessment & Plan:    1. Urinary incontinence, unspecified type We will trial detrol LA 4mg  daily - Urinalysis, Routine w reflex microscopic  2. OAB (overactive bladder) -We will trial Detrol LA 4mg  daily   No follow-ups on file.  Nicolette Bang, MD  [facility] [place]

## 2022-02-01 NOTE — Patient Instructions (Signed)

## 2022-02-01 NOTE — Progress Notes (Signed)
post void residual=13 

## 2022-02-04 ENCOUNTER — Ambulatory Visit: Payer: Medicare PPO | Admitting: Podiatry

## 2022-02-08 ENCOUNTER — Other Ambulatory Visit: Payer: Medicare PPO | Admitting: Urology

## 2022-02-16 ENCOUNTER — Ambulatory Visit: Payer: Medicare PPO | Admitting: Podiatry

## 2022-02-16 DIAGNOSIS — B351 Tinea unguium: Secondary | ICD-10-CM

## 2022-02-16 DIAGNOSIS — M79674 Pain in right toe(s): Secondary | ICD-10-CM

## 2022-02-16 DIAGNOSIS — M79675 Pain in left toe(s): Secondary | ICD-10-CM | POA: Diagnosis not present

## 2022-02-16 DIAGNOSIS — E1149 Type 2 diabetes mellitus with other diabetic neurological complication: Secondary | ICD-10-CM

## 2022-02-23 NOTE — Progress Notes (Signed)
Subjective: 61 y.o. returns the office today for painful, elongated, thickened toenails which she cannot trim herself.  Denies any recent ulcerations or new concerns today.   PCP: Beatrix Fetters, MD-last seen October 23, 2021 Last A1c was 7.8 on October 23, 2021  Objective: AAO 3, NAD DP/PT pulses palpable, CRT less than 3 seconds Protective sensation decreased with Dorann Ou monofilament Dropfoot on the left. Nails hypertrophic, dystrophic, elongated, brittle, discolored 9. There is tenderness overlying the nails 1-5 bilaterally. There is no surrounding erythema or drainage along the nail sites.  She does get tenderness to nails 1 through 5 on the left and 1 through 4 on the right are thickened elongated. Incision for the right foot surgery is well-healed with a scar no new ulcerations are noted. No pain with calf compression, swelling, warmth, erythema.  Assessment: Patient presents with symptomatic onychomycosis  Plan: -Treatment options including alternatives, risks, complications were discussed -Nails sharply debrided 9 without complication/bleeding.  Monitor the right hallux toenail for the subungual hematoma.  Monitoring signs or symptoms of infection. -Discussed daily foot inspection. If there are any changes, to call the office immediately.  -Follow-up in 3 months or sooner if any problems are to arise. In the meantime, encouraged to call the office with any questions, concerns, changes symptoms.  Ovid Curd, DPM

## 2022-03-08 ENCOUNTER — Encounter: Payer: Self-pay | Admitting: Urology

## 2022-03-08 ENCOUNTER — Ambulatory Visit (INDEPENDENT_AMBULATORY_CARE_PROVIDER_SITE_OTHER): Payer: Medicare PPO | Admitting: Urology

## 2022-03-08 VITALS — BP 169/75 | HR 64 | Ht 65.5 in | Wt 220.0 lb

## 2022-03-08 DIAGNOSIS — R32 Unspecified urinary incontinence: Secondary | ICD-10-CM

## 2022-03-08 LAB — BLADDER SCAN AMB NON-IMAGING: Scan Result: 1

## 2022-03-08 MED ORDER — OXYBUTYNIN CHLORIDE 5 MG PO TABS: 5.0000 mg | ORAL_TABLET | Freq: Two times a day (BID) | ORAL | 11 refills | Status: DC

## 2022-03-08 NOTE — Progress Notes (Signed)
post void residual =1ml 

## 2022-03-08 NOTE — Progress Notes (Signed)
03/08/2022 4:12 PM   Melanie Spencer 1961-05-21 [id number]  Referring provider: Wilburt Finlay, MD [place],  [place] [address]  Followup OAB   HPI: Melanie Spencer is a 61yo here for followup for OAB and urge incontinence. Detrol LA failed to improve her urgency and urge incontinence. She took oxybutynin in 2020 which improved her OAB. She uses 5-7 pads per day. No straining to urinate. Urine stream strong. No other complaints todsy   PMH: Past Medical History:  Diagnosis Date   Cerebrovascular disease 05/23/2019   Essential hypertension, benign 05/23/2019   History of stroke 2007   left leg weakness   HLD (hyperlipidemia) 05/23/2019   Morbid obesity ([place]) 05/23/2019   Type II diabetes mellitus, uncontrolled 05/23/2019   Vitamin D deficiency     Surgical History: Past Surgical History:  Procedure Laterality Date   AMPUTATION Right 11/09/2019   Procedure: 5TH AMPUTATION RAY;  Surgeon: Trula Slade, DPM;  Location: [facility];  Service: Podiatry;  Laterality: Right;   CHOLECYSTECTOMY     COLONOSCOPY N/A 11/12/2020   Procedure: COLONOSCOPY;  Surgeon: Daneil Dolin, MD;  [facility];  Service: Endoscopy;  Laterality: N/A;  ASA III / PM procedure   POLYPECTOMY  11/12/2020   Procedure: POLYPECTOMY;  Surgeon: Daneil Dolin, MD;  [facility];  Service: Endoscopy;;    Home Medications:  Allergies as of 03/08/2022   No Known Allergies      Medication List        Accurate as of March 08, 2022  4:12 PM. If you have any questions, ask your nurse or doctor.          Accu-Chek Guide test strip Generic drug: glucose blood TEST FOUR TIMES A DAY   acetaminophen 325 MG tablet Commonly known as: TYLENOL Take 2 tablets (650 mg total) by mouth every 6 (six) hours as needed for mild pain (or Fever >/= 101).   acidophilus Caps capsule Take 2 capsules by mouth daily.   amLODipine 2.5 MG tablet Commonly known as: NORVASC Take 5 mg by mouth  daily.   aspirin 325 MG tablet Take 325 mg by mouth daily.   cholecalciferol 1000 units tablet Commonly known as: VITAMIN D Take 1,000 Units by mouth daily.   diclofenac Sodium 1 % Gel Commonly known as: VOLTAREN Apply 2 g topically 4 (four) times daily.   fluticasone 50 MCG/ACT nasal spray Commonly known as: FLONASE SHAKE LIQUID AND USE 2 SPRAYS IN EACH NOSTRIL EVERY DAY What changed: See the new instructions.   gabapentin 300 MG capsule Commonly known as: NEURONTIN Take 600 mg by mouth 2 (two) times daily.   hydrOXYzine 25 MG tablet Commonly known as: ATARAX Take by mouth.   Insulin Pen Needle 29G X 12.7MM Misc 1 Units by Does not apply route daily at 12 noon.   Lantus SoloStar 100 UNIT/ML Solostar Pen Generic drug: insulin glargine ADMINISTER 40 UNITS UNDER THE SKIN AT BEDTIME What changed:  how much to take how to take this when to take this additional instructions   metFORMIN 1000 MG tablet Commonly known as: GLUCOPHAGE Take 1 tablet (1,000 mg total) by mouth 2 (two) times daily.   mirabegron ER 25 MG Tb24 tablet Commonly known as: MYRBETRIQ Take 1 tablet (25 mg total) by mouth daily.   nystatin powder Commonly known as: MYCOSTATIN/NYSTOP Apply topically 3 (three) times daily.   omeprazole 20 MG capsule Commonly known as: PRILOSEC Take 1 capsule by  mouth daily.   ondansetron 4 MG tablet Commonly known as: ZOFRAN Take 1 tablet (4 mg total) by mouth every 6 (six) hours as needed for nausea.   oxyCODONE 5 MG immediate release tablet Commonly known as: Oxy IR/ROXICODONE Take 1 tablet (5 mg total) by mouth every 6 (six) hours as needed for moderate pain.   pioglitazone 15 MG tablet Commonly known as: ACTOS Take 1 tablet by mouth daily.   ramipril 10 MG capsule Commonly known as: ALTACE TAKE 2 CAPSULES(20 MG) BY MOUTH DAILY What changed: See the new instructions.   simvastatin 40 MG tablet Commonly known as: ZOCOR Take 1 tablet by mouth at  bedtime.   tolterodine 4 MG 24 hr capsule Commonly known as: Detrol LA Take 1 capsule (4 mg total) by mouth daily.   Trulicity 4.5 MG/0.5ML Sopn Generic drug: Dulaglutide Inject 4.5 mg as directed once a week.        Allergies: No Known Allergies  Family History: Family History  Problem Relation Age of Onset   Heart disease Mother    Diabetes Mother    Hypertension Mother    Cancer Father        Lung   Hypertension Sister    Macular degeneration Sister    Appendicitis Brother    Hypertension Sister    Diabetes Sister     Social History:  reports that she quit smoking about 16 years ago. Her smoking use included cigarettes. She has a 40.00 pack-year smoking history. She has never used smokeless tobacco. She reports that she does not drink alcohol and does not use drugs.  ROS: All other review of systems were reviewed and are negative except what is noted above in HPI  Physical Exam: BP (!) 169/75   Pulse 64   Ht 5' 5.5" (1.664 m)   Wt 220 lb (99.8 kg)   LMP 03/02/2012   BMI 36.05 kg/m   Constitutional:  Alert and oriented, No acute distress. HEENT: [place] AT, moist mucus membranes.  Trachea midline, no masses. Cardiovascular: No clubbing, cyanosis, or edema. Respiratory: Normal respiratory effort, no increased work of breathing. GI: Abdomen is soft, nontender, nondistended, no abdominal masses GU: No CVA tenderness.  Lymph: No cervical or inguinal lymphadenopathy. Skin: No rashes, bruises or suspicious lesions. Neurologic: Grossly intact, no focal deficits, moving all 4 extremities. Psychiatric: Normal mood and affect.  Laboratory Data: Lab Results  Component Value Date   WBC 7.5 10/17/2021   HGB 10.0 (L) 10/17/2021   HCT 31.3 (L) 10/17/2021   MCV 83.9 10/17/2021   PLT 333 10/17/2021    Lab Results  Component Value Date   CREATININE 0.88 10/17/2021    No results found for: "PSA"  No results found for: "TESTOSTERONE"  Lab Results  Component Value  Date   HGBA1C 7.8 (H) 10/15/2021    Urinalysis    Component Value Date/Time   COLORURINE YELLOW 10/14/2021 0319   APPEARANCEUR Clear 02/01/2022 1352   LABSPEC 1.026 10/14/2021 0319   PHURINE 5.0 10/14/2021 0319   GLUCOSEU Trace (A) 02/01/2022 1352   HGBUR NEGATIVE 10/14/2021 0319   BILIRUBINUR Negative 02/01/2022 1352   KETONESUR NEGATIVE 10/14/2021 0319   PROTEINUR 2+ (A) 02/01/2022 1352   PROTEINUR 30 (A) 10/14/2021 0319   NITRITE Negative 02/01/2022 1352   NITRITE NEGATIVE 10/14/2021 0319   LEUKOCYTESUR 1+ (A) 02/01/2022 1352   LEUKOCYTESUR MODERATE (A) 10/14/2021 0319    Lab Results  Component Value Date   LABMICR See below: 02/01/2022  WBCUA >30 (A) 02/01/2022   LABEPIT 0-10 02/01/2022   MUCUS Present 02/01/2022   BACTERIA Moderate (A) 02/01/2022    Pertinent Imaging:  No results found for this or any previous visit.  No results found for this or any previous visit.  No results found for this or any previous visit.  No results found for this or any previous visit.  No results found for this or any previous visit.  No results found for this or any previous visit.  No results found for this or any previous visit.  No results found for this or any previous visit.   Assessment & Plan:    1. Urinary incontinence, unspecified type -We will trial oxybutynin 5mg  BID. RTc 8 weeks - BLADDER SCAN AMB NON-IMAGING - Urinalysis, Routine w reflex microscopic   No follow-ups on file.  , MD  [facility] [place]

## 2022-03-10 LAB — URINALYSIS, ROUTINE W REFLEX MICROSCOPIC
Bilirubin, UA: NEGATIVE
Nitrite, UA: NEGATIVE
RBC, UA: NEGATIVE
Specific Gravity, UA: 1.025 (ref 1.005–1.030)
Urobilinogen, Ur: 0.2 mg/dL (ref 0.2–1.0)
pH, UA: 5 (ref 5.0–7.5)

## 2022-03-10 LAB — MICROSCOPIC EXAMINATION

## 2022-04-21 ENCOUNTER — Ambulatory Visit: Payer: Medicare PPO | Admitting: Urology

## 2022-04-21 IMAGING — DX DG SHOULDER 2+V*L*
3 series · 4 of 4 positions shown · non-contrast
Comparison: None.

CLINICAL DATA: Fall and trauma to the left shoulder.

EXAM:
LEFT SHOULDER - 2+ VIEW

[shoulder ap]
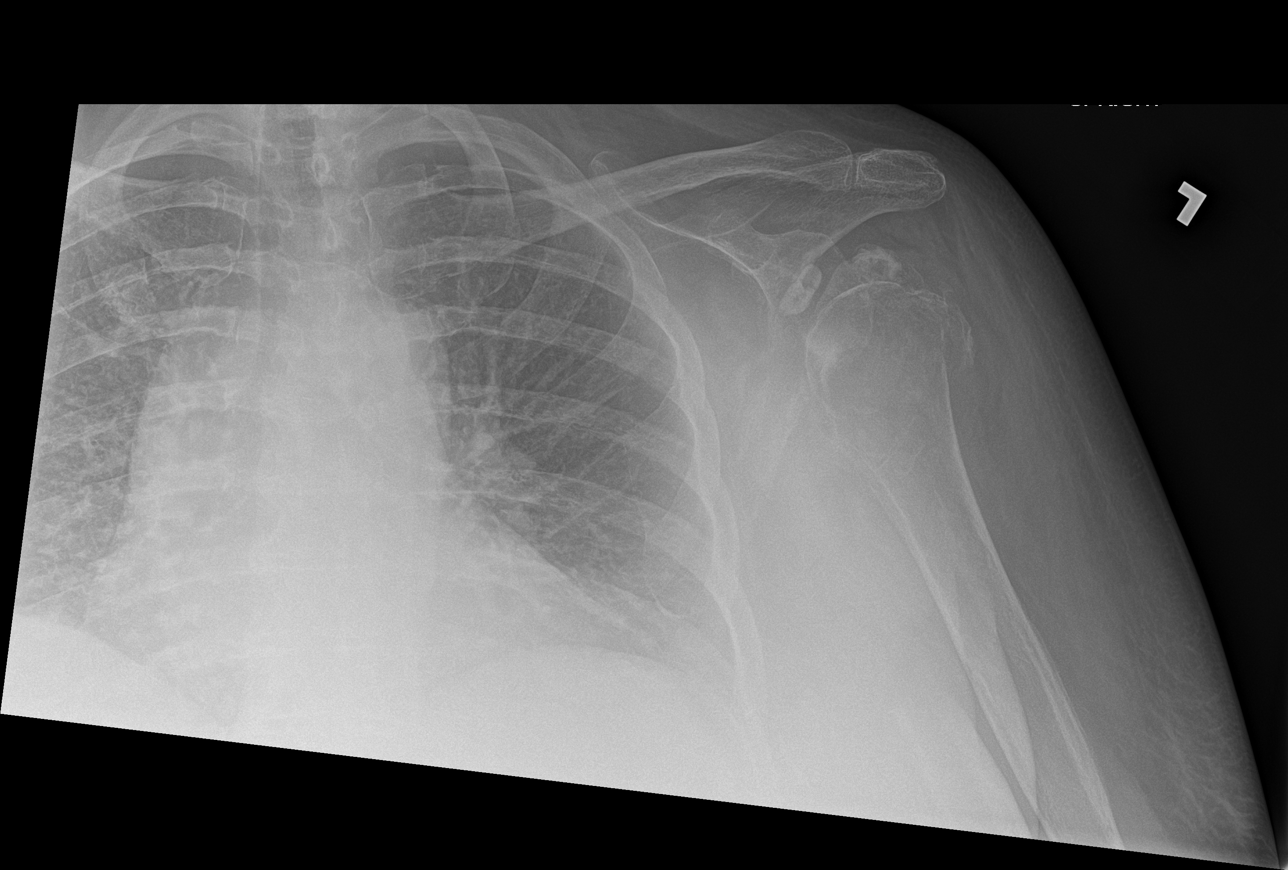

[Series 4: shoulder obl · 0.14mm/px · 2 of 2 slices shown]
[im 1/2]
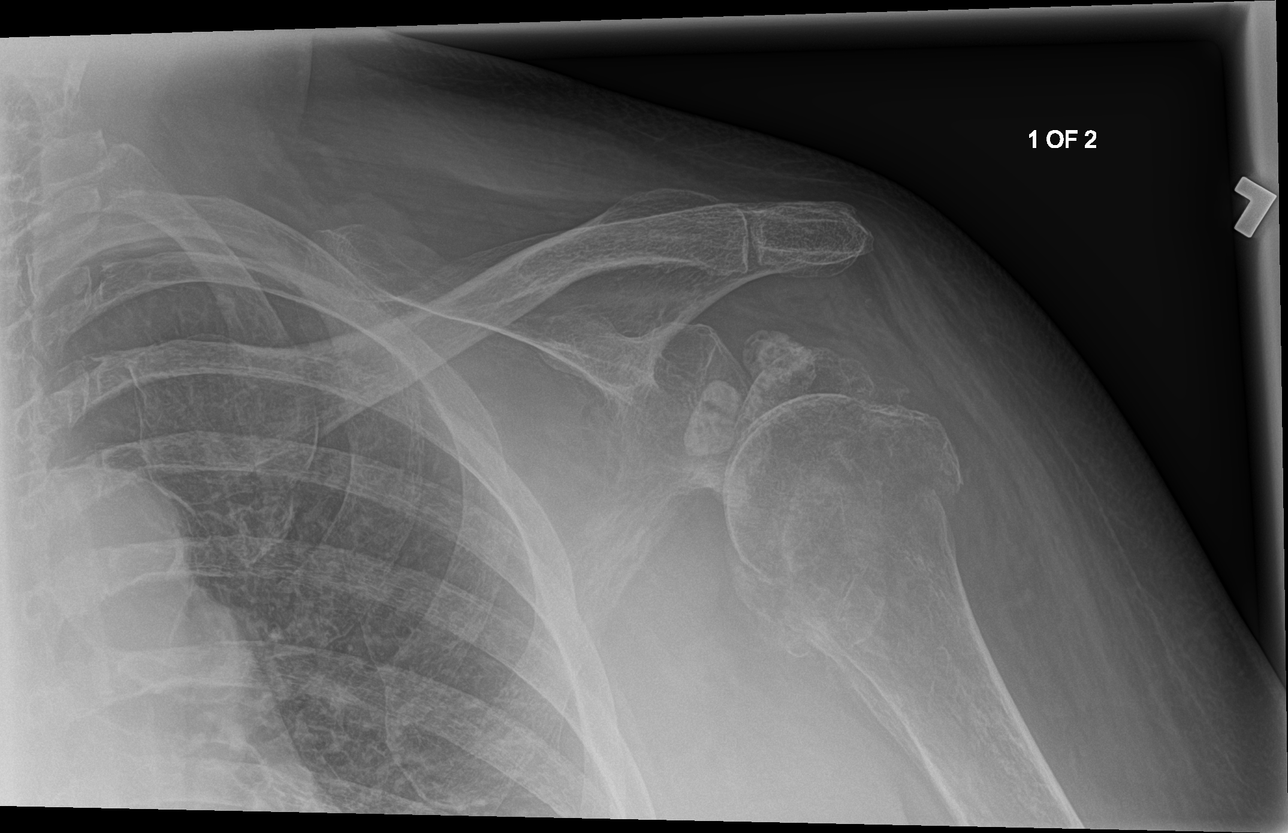
[im 2/2]
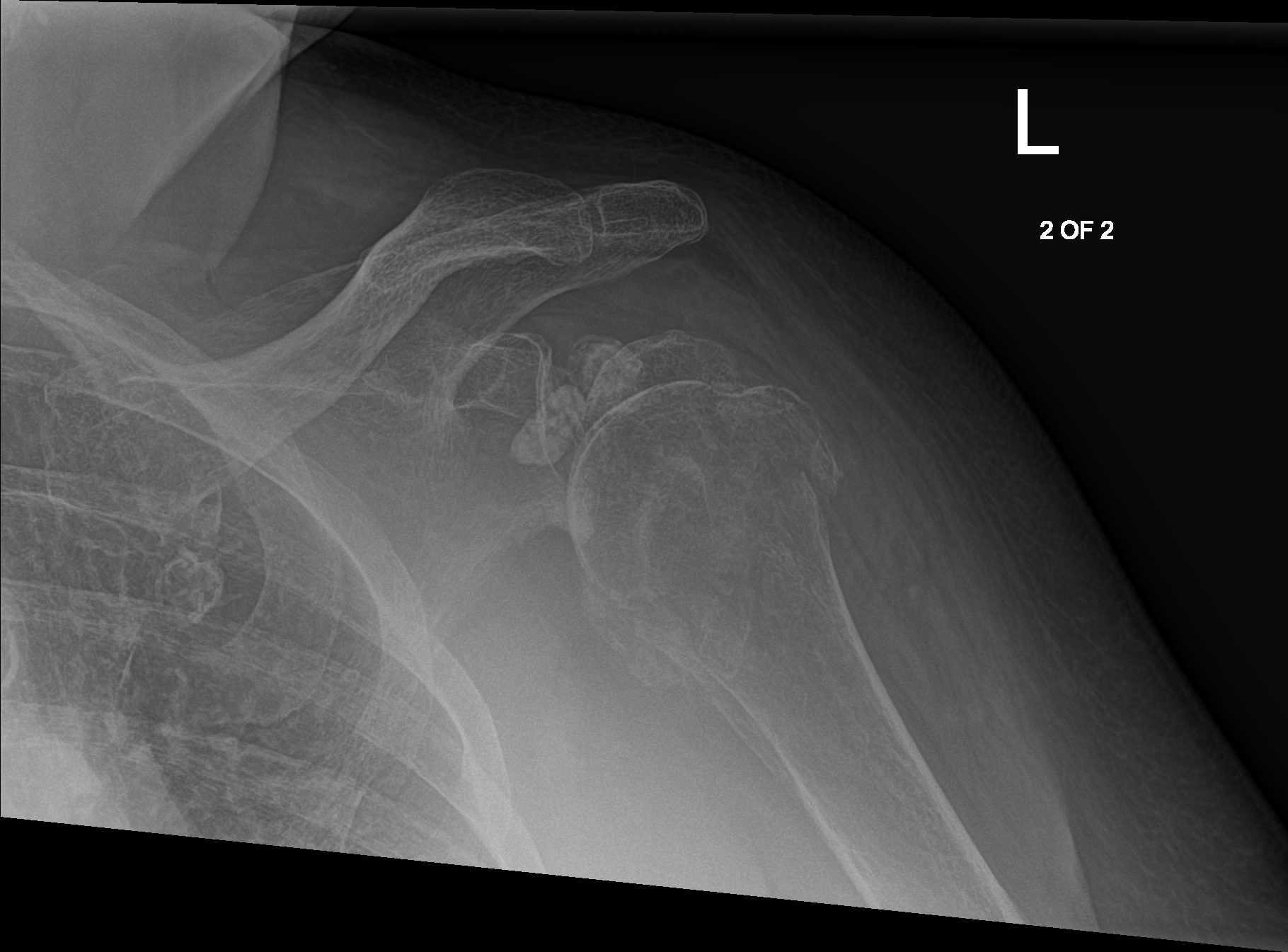

[shoulder y view]
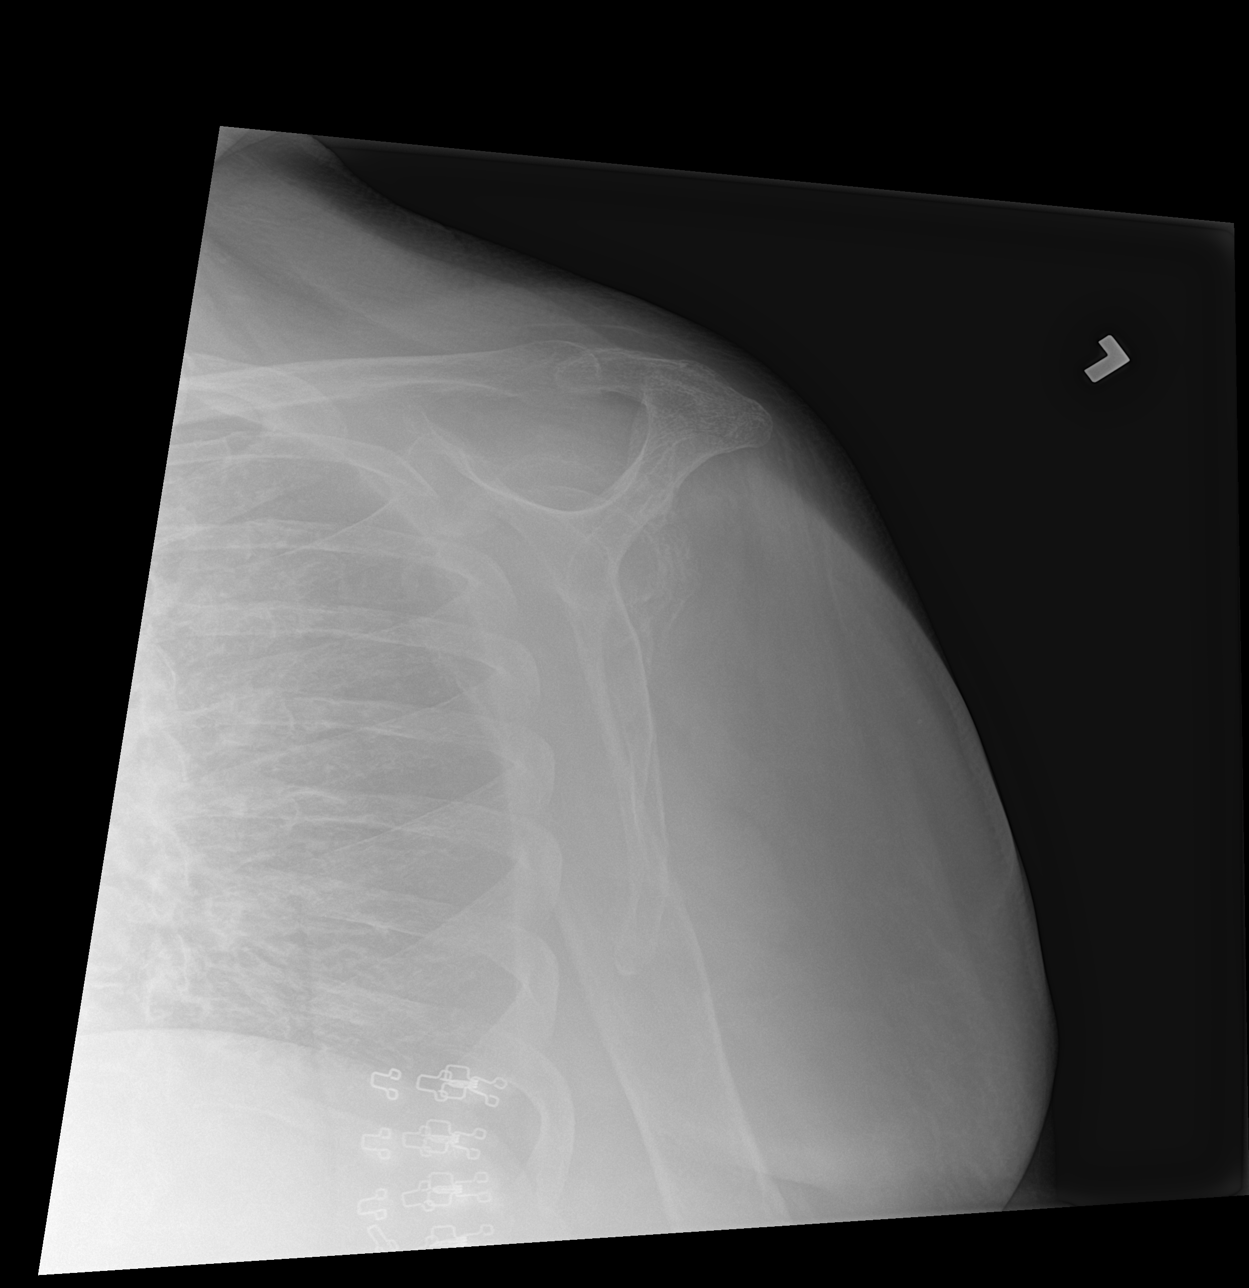

[4 of 4 positions shown; findings below may reference images not displayed]

FINDINGS: There is a displaced comminuted appearing fracture of the left
humeral head and neck. Evaluation for fracture is limited due to
advanced osteopenia. There may be slight anterior subluxation of the
humeral head in relation to the glenoid. Evaluation for dislocation
is limited on the provided views. Several ovoid corticated
structures within the joint space likely represent loose bodies. The
soft tissues are unremarkable.
IMPRESSION: 1. Displaced comminuted appearing fracture of the left humeral head
and neck.
2. Possible partial anterior subluxation of the left shoulder.
3. Advanced osteopenia.

## 2022-04-28 ENCOUNTER — Ambulatory Visit: Payer: Medicare PPO | Admitting: Urology

## 2022-05-20 ENCOUNTER — Ambulatory Visit: Payer: Medicare PPO | Admitting: Podiatry

## 2022-05-20 DIAGNOSIS — M79675 Pain in left toe(s): Secondary | ICD-10-CM | POA: Diagnosis not present

## 2022-05-20 DIAGNOSIS — E1149 Type 2 diabetes mellitus with other diabetic neurological complication: Secondary | ICD-10-CM

## 2022-05-20 DIAGNOSIS — M79674 Pain in right toe(s): Secondary | ICD-10-CM

## 2022-05-20 DIAGNOSIS — B351 Tinea unguium: Secondary | ICD-10-CM | POA: Diagnosis not present

## 2022-05-21 NOTE — Progress Notes (Signed)
Subjective: 61 y.o. returns the office today for painful, elongated, thickened toenails which she cannot trim herself.  Denies any recent ulcerations or new concerns today.   PCP: Wilburt Finlay, MD-last seen October 23, 2021 Last A1c was 8.5 on 04/22/2022  Objective: AAO 3, NAD DP/PT pulses palpable, CRT less than 3 seconds Protective sensation decreased with Derrel Nip monofilament Dropfoot on the left. Nails hypertrophic, dystrophic, elongated, brittle, discolored 9. There is tenderness overlying the nails 1-5 bilaterally. There is no surrounding erythema or drainage along the nail sites.  She does get tenderness to nails 1 through 5 on the left and 1 through 4 on the right are thickened elongated. Incision for the right foot surgery is well-healed with a scar no new ulcerations are noted. Minimal callus on the right heel, no ulceration or signs of infection  No pain with calf compression, swelling, warmth, erythema.  Assessment: Patient presents with symptomatic onychomycosis  Plan: -Treatment options including alternatives, risks, complications were discussed -Nails sharply debrided 9 without complication/bleeding.  Monitor the right hallux toenail for the subungual hematoma.  Monitoring signs or symptoms of infection. -As a courtesy debrided callus without any complications or bleeding as it was very minimal. Recommended moisturizer daily.  -Discussed daily foot inspection. If there are any changes, to call the office immediately.  -Follow-up in 3 months or sooner if any problems are to arise. In the meantime, encouraged to call the office with any questions, concerns, changes symptoms.  Celesta Gentile, DPM

## 2022-06-02 ENCOUNTER — Other Ambulatory Visit (HOSPITAL_COMMUNITY): Payer: Self-pay | Admitting: Family Medicine

## 2022-06-02 DIAGNOSIS — Z1231 Encounter for screening mammogram for malignant neoplasm of breast: Secondary | ICD-10-CM

## 2022-06-09 ENCOUNTER — Encounter: Payer: Self-pay | Admitting: Urology

## 2022-06-09 ENCOUNTER — Ambulatory Visit: Payer: Medicare PPO | Admitting: Urology

## 2022-06-09 VITALS — BP 149/77 | HR 80

## 2022-06-09 DIAGNOSIS — N3281 Overactive bladder: Secondary | ICD-10-CM | POA: Diagnosis not present

## 2022-06-09 DIAGNOSIS — R32 Unspecified urinary incontinence: Secondary | ICD-10-CM

## 2022-06-09 MED ORDER — FESOTERODINE FUMARATE ER 8 MG PO TB24: 8.0000 mg | ORAL_TABLET | Freq: Every day | ORAL | 11 refills | Status: DC

## 2022-06-09 NOTE — Patient Instructions (Signed)

## 2022-06-09 NOTE — Progress Notes (Signed)
06/09/2022 4:09 PM   Melanie Spencer 1961/01/17 [id number]  Referring provider: Beatrix Fetters, MD [address] [place],  [place] [address]  Followup OAB   HPI: [name] is a 61yo here for followup for urge incontinence and OAB. She tried oxybutynin 5mg  BID which failed to improve her incontinence and OAb symptoms. She then took detrol LA 8mg  daily which partially improved her nocturnal enuresis and daytime urge incontinence.    PMH: Past Medical History:  Diagnosis Date   Cerebrovascular disease 05/23/2019   Essential hypertension, benign 05/23/2019   History of stroke 2007   left leg weakness   HLD (hyperlipidemia) 05/23/2019   Morbid obesity ([facility]) 05/23/2019   Type II diabetes mellitus, uncontrolled 05/23/2019   Vitamin D deficiency     Surgical History: Past Surgical History:  Procedure Laterality Date   AMPUTATION Right 11/09/2019   Procedure: 5TH AMPUTATION RAY;  Surgeon: 05/25/2019, DPM;  [facility];  Service: Podiatry;  Laterality: Right;   CHOLECYSTECTOMY     COLONOSCOPY N/A 11/12/2020   Procedure: COLONOSCOPY;  Surgeon: Vivi Barrack, MD;  [facility];  Service: Endoscopy;  Laterality: N/A;  ASA III / PM procedure   POLYPECTOMY  11/12/2020   Procedure: POLYPECTOMY;  Surgeon: Corbin Ade, MD;  [facility];  Service: Endoscopy;;    Home Medications:  Allergies as of 06/09/2022   No Known Allergies      Medication List        Accurate as of June 09, 2022  4:09 PM. If you have any questions, ask your nurse or doctor.          Accu-Chek Guide test strip Generic drug: glucose blood TEST FOUR TIMES A DAY   acetaminophen 325 MG tablet Commonly known as: TYLENOL Take 2 tablets (650 mg total) by mouth every 6 (six) hours as needed for mild pain (or Fever >/= 101).   acidophilus Caps capsule Take 2 capsules by mouth daily.   amLODipine 2.5 MG tablet Commonly known as: NORVASC Take 5 mg by mouth daily.    aspirin 325 MG tablet Take 325 mg by mouth daily.   cholecalciferol 1000 units tablet Commonly known as: VITAMIN D Take 1,000 Units by mouth daily.   diclofenac Sodium 1 % Gel Commonly known as: VOLTAREN Apply 2 g topically 4 (four) times daily.   fluticasone 50 MCG/ACT nasal spray Commonly known as: FLONASE SHAKE LIQUID AND USE 2 SPRAYS IN EACH NOSTRIL EVERY DAY What changed: See the new instructions.   gabapentin 300 MG capsule Commonly known as: NEURONTIN Take 600 mg by mouth 2 (two) times daily.   hydrOXYzine 25 MG tablet Commonly known as: ATARAX Take by mouth.   Insulin Pen Needle 29G X 12.7MM Misc 1 Units by Does not apply route daily at 12 noon.   Lantus SoloStar 100 UNIT/ML Solostar Pen Generic drug: insulin glargine ADMINISTER 40 UNITS UNDER THE SKIN AT BEDTIME What changed:  how much to take how to take this when to take this additional instructions   metFORMIN 1000 MG tablet Commonly known as: GLUCOPHAGE Take 1 tablet (1,000 mg total) by mouth 2 (two) times daily.   mirabegron ER 25 MG Tb24 tablet Commonly known as: MYRBETRIQ Take 1 tablet (25 mg total) by mouth daily.   nystatin powder Commonly known as: MYCOSTATIN/NYSTOP Apply topically 3 (three) times daily.   omeprazole 20 MG capsule Commonly known as: PRILOSEC Take 1 capsule by mouth daily.  ondansetron 4 MG tablet Commonly known as: ZOFRAN Take 1 tablet (4 mg total) by mouth every 6 (six) hours as needed for nausea.   oxybutynin 5 MG tablet Commonly known as: DITROPAN Take 1 tablet (5 mg total) by mouth 2 (two) times daily.   oxyCODONE 5 MG immediate release tablet Commonly known as: Oxy IR/ROXICODONE Take 1 tablet (5 mg total) by mouth every 6 (six) hours as needed for moderate pain.   pioglitazone 15 MG tablet Commonly known as: ACTOS Take 1 tablet by mouth daily.   ramipril 10 MG capsule Commonly known as: ALTACE TAKE 2 CAPSULES(20 MG) BY MOUTH DAILY What changed: See the  new instructions.   simvastatin 40 MG tablet Commonly known as: ZOCOR Take 1 tablet by mouth at bedtime.   tolterodine 4 MG 24 hr capsule Commonly known as: Detrol LA Take 1 capsule (4 mg total) by mouth daily.   Trulicity 4.5 0000000 Sopn Generic drug: Dulaglutide Inject 4.5 mg as directed once a week.        Allergies: No Known Allergies  Family History: Family History  Problem Relation Age of Onset   Heart disease Mother    Diabetes Mother    Hypertension Mother    Cancer Father        Lung   Hypertension Sister    Macular degeneration Sister    Appendicitis Brother    Hypertension Sister    Diabetes Sister     Social History:  reports that she quit smoking about 16 years ago. Her smoking use included cigarettes. She has a 40.00 pack-year smoking history. She has never used smokeless tobacco. She reports that she does not drink alcohol and does not use drugs.  ROS: All other review of systems were reviewed and are negative except what is noted above in HPI  Physical Exam: BP (!) 149/77   Pulse 80   LMP 03/02/2012   Constitutional:  Alert and oriented, No acute distress. HEENT:  AT, moist mucus membranes.  Trachea midline, no masses. Cardiovascular: No clubbing, cyanosis, or edema. Respiratory: Normal respiratory effort, no increased work of breathing. GI: Abdomen is soft, nontender, nondistended, no abdominal masses GU: No CVA tenderness.  Lymph: No cervical or inguinal lymphadenopathy. Skin: No rashes, bruises or suspicious lesions. Neurologic: Grossly intact, no focal deficits, moving all 4 extremities. Psychiatric: Normal mood and affect.  Laboratory Data: Lab Results  Component Value Date   WBC 7.5 10/17/2021   HGB 10.0 (L) 10/17/2021   HCT 31.3 (L) 10/17/2021   MCV 83.9 10/17/2021   PLT 333 10/17/2021    Lab Results  Component Value Date   CREATININE 0.88 10/17/2021    No results found for: "PSA"  No results found for:  "TESTOSTERONE"  Lab Results  Component Value Date   HGBA1C 7.8 (H) 10/15/2021    Urinalysis    Component Value Date/Time   COLORURINE YELLOW 10/14/2021 0319   APPEARANCEUR Cloudy (A) 03/08/2022 1613   LABSPEC 1.026 10/14/2021 0319   PHURINE 5.0 10/14/2021 0319   GLUCOSEU 1+ (A) 03/08/2022 1613   HGBUR NEGATIVE 10/14/2021 0319   BILIRUBINUR Negative 03/08/2022 1613   KETONESUR NEGATIVE 10/14/2021 0319   PROTEINUR 2+ (A) 03/08/2022 1613   PROTEINUR 30 (A) 10/14/2021 0319   NITRITE Negative 03/08/2022 1613   NITRITE NEGATIVE 10/14/2021 0319   LEUKOCYTESUR Trace (A) 03/08/2022 1613   LEUKOCYTESUR MODERATE (A) 10/14/2021 0319    Lab Results  Component Value Date   LABMICR See below: 03/08/2022  WBCUA 11-30 (A) 03/08/2022   LABEPIT 0-10 03/08/2022   MUCUS Present 02/01/2022   BACTERIA Moderate (A) 03/08/2022    Pertinent Imaging:  No results found for this or any previous visit.  No results found for this or any previous visit.  No results found for this or any previous visit.  No results found for this or any previous visit.  No results found for this or any previous visit.  No valid procedures specified. No results found for this or any previous visit.  No results found for this or any previous visit.   Assessment & Plan:    1. Urinary incontinence, unspecified type Toviaz 8mg  daily - Urinalysis, Routine w reflex microscopic  2. OAB (overactive bladder) -Toviaz 8mg  daily   No follow-ups on file.  Nicolette Bang, MD  [facility] [place]

## 2022-06-10 LAB — URINALYSIS, ROUTINE W REFLEX MICROSCOPIC
Bilirubin, UA: NEGATIVE
Ketones, UA: NEGATIVE
Nitrite, UA: NEGATIVE
RBC, UA: NEGATIVE
Specific Gravity, UA: 1.025 (ref 1.005–1.030)
Urobilinogen, Ur: 0.2 mg/dL (ref 0.2–1.0)
pH, UA: 5 (ref 5.0–7.5)

## 2022-06-10 LAB — MICROSCOPIC EXAMINATION

## 2022-07-15 ENCOUNTER — Ambulatory Visit (HOSPITAL_COMMUNITY): Payer: Medicare PPO

## 2022-07-28 ENCOUNTER — Inpatient Hospital Stay (HOSPITAL_COMMUNITY): Admission: RE | Admit: 2022-07-28 | Payer: Medicare PPO | Source: Ambulatory Visit

## 2022-08-02 ENCOUNTER — Encounter (HOSPITAL_COMMUNITY): Payer: Self-pay

## 2022-08-02 ENCOUNTER — Ambulatory Visit (HOSPITAL_COMMUNITY)
Admission: RE | Admit: 2022-08-02 | Discharge: 2022-08-02 | Disposition: A | Discharge status: Home / Self Care | Payer: Medicare PPO | Source: Ambulatory Visit | Attending: Family Medicine | Admitting: Family Medicine

## 2022-08-02 DIAGNOSIS — Z1231 Encounter for screening mammogram for malignant neoplasm of breast: Secondary | ICD-10-CM | POA: Insufficient documentation

## 2022-08-04 ENCOUNTER — Encounter (HOSPITAL_COMMUNITY): Payer: Self-pay | Admitting: *Deleted

## 2022-08-04 ENCOUNTER — Other Ambulatory Visit: Payer: Self-pay

## 2022-08-04 ENCOUNTER — Emergency Department (HOSPITAL_COMMUNITY): Payer: Medicare PPO

## 2022-08-04 ENCOUNTER — Emergency Department (HOSPITAL_COMMUNITY)
Admission: EM | Admit: 2022-08-04 | Discharge: 2022-08-04 | Disposition: A | Discharge status: Home / Self Care | Payer: Medicare PPO | Attending: Emergency Medicine | Admitting: Emergency Medicine

## 2022-08-04 DIAGNOSIS — Z79899 Other long term (current) drug therapy: Secondary | ICD-10-CM | POA: Insufficient documentation

## 2022-08-04 DIAGNOSIS — N179 Acute kidney failure, unspecified: Secondary | ICD-10-CM

## 2022-08-04 DIAGNOSIS — E119 Type 2 diabetes mellitus without complications: Secondary | ICD-10-CM | POA: Insufficient documentation

## 2022-08-04 DIAGNOSIS — I10 Essential (primary) hypertension: Secondary | ICD-10-CM | POA: Insufficient documentation

## 2022-08-04 DIAGNOSIS — N3001 Acute cystitis with hematuria: Secondary | ICD-10-CM | POA: Insufficient documentation

## 2022-08-04 DIAGNOSIS — R109 Unspecified abdominal pain: Secondary | ICD-10-CM | POA: Diagnosis present

## 2022-08-04 DIAGNOSIS — Z7984 Long term (current) use of oral hypoglycemic drugs: Secondary | ICD-10-CM | POA: Insufficient documentation

## 2022-08-04 DIAGNOSIS — Z794 Long term (current) use of insulin: Secondary | ICD-10-CM | POA: Diagnosis not present

## 2022-08-04 DIAGNOSIS — Z7982 Long term (current) use of aspirin: Secondary | ICD-10-CM | POA: Diagnosis not present

## 2022-08-04 DIAGNOSIS — Z8673 Personal history of transient ischemic attack (TIA), and cerebral infarction without residual deficits: Secondary | ICD-10-CM | POA: Insufficient documentation

## 2022-08-04 HISTORY — DX: Urinary tract infection, site not specified: N39.0

## 2022-08-04 LAB — CBC WITH DIFFERENTIAL/PLATELET
Abs Immature Granulocytes: 0.04 10*3/uL (ref 0.00–0.07)
Basophils Absolute: 0 10*3/uL (ref 0.0–0.1)
Basophils Relative: 1 %
Eosinophils Absolute: 0.1 10*3/uL (ref 0.0–0.5)
Eosinophils Relative: 1 %
HCT: 34.9 % — ABNORMAL LOW (ref 36.0–46.0)
Hemoglobin: 10.5 g/dL — ABNORMAL LOW (ref 12.0–15.0)
Immature Granulocytes: 1 %
Lymphocytes Relative: 29 %
Lymphs Abs: 1.9 10*3/uL (ref 0.7–4.0)
MCH: 26.2 pg (ref 26.0–34.0)
MCHC: 30.1 g/dL (ref 30.0–36.0)
MCV: 87 fL (ref 80.0–100.0)
Monocytes Absolute: 0.5 10*3/uL (ref 0.1–1.0)
Monocytes Relative: 8 %
Neutro Abs: 4 10*3/uL (ref 1.7–7.7)
Neutrophils Relative %: 60 %
Platelets: 265 10*3/uL (ref 150–400)
RBC: 4.01 MIL/uL (ref 3.87–5.11)
RDW: 14.8 % (ref 11.5–15.5)
WBC: 6.6 10*3/uL (ref 4.0–10.5)
nRBC: 0 % (ref 0.0–0.2)

## 2022-08-04 LAB — COMPREHENSIVE METABOLIC PANEL
ALT: 13 U/L (ref 0–44)
AST: 17 U/L (ref 15–41)
Albumin: 3.6 g/dL (ref 3.5–5.0)
Alkaline Phosphatase: 86 U/L (ref 38–126)
Anion gap: 8 (ref 5–15)
BUN: 31 mg/dL — ABNORMAL HIGH (ref 8–23)
CO2: 24 mmol/L (ref 22–32)
Calcium: 9.1 mg/dL (ref 8.9–10.3)
Chloride: 106 mmol/L (ref 98–111)
Creatinine, Ser: 1.3 mg/dL — ABNORMAL HIGH (ref 0.44–1.00)
GFR, Estimated: 46 mL/min — ABNORMAL LOW (ref >60)
Glucose, Bld: 109 mg/dL — ABNORMAL HIGH (ref 70–99)
Potassium: 4.9 mmol/L (ref 3.5–5.1)
Sodium: 138 mmol/L (ref 135–145)
Total Bilirubin: 0.6 mg/dL (ref 0.3–1.2)
Total Protein: 8 g/dL (ref 6.5–8.1)

## 2022-08-04 LAB — URINALYSIS, ROUTINE W REFLEX MICROSCOPIC
Bilirubin Urine: NEGATIVE
Glucose, UA: 50 mg/dL — AB
Ketones, ur: NEGATIVE mg/dL
Nitrite: NEGATIVE
Protein, ur: 100 mg/dL — AB
Specific Gravity, Urine: 1.027 (ref 1.005–1.030)
WBC, UA: >50 WBC/hpf — ABNORMAL HIGH (ref 0–5)
pH: 5 (ref 5.0–8.0)

## 2022-08-04 LAB — LIPASE, BLOOD: Lipase: 30 U/L (ref 11–51)

## 2022-08-04 MED ORDER — SODIUM CHLORIDE 0.9 % IV BOLUS
500.0000 mL | Freq: Once | INTRAVENOUS | Status: AC
  Administered 2022-08-04: 500 mL via INTRAVENOUS

## 2022-08-04 MED ORDER — CEFTRIAXONE SODIUM 1 G IJ SOLR
1.0000 g | Freq: Once | INTRAMUSCULAR | Status: AC
  Administered 2022-08-04: 1 g via INTRAVENOUS
  Filled 2022-08-04: qty 10

## 2022-08-04 MED ORDER — PHENAZOPYRIDINE HCL 200 MG PO TABS: 200.0000 mg | ORAL_TABLET | Freq: Three times a day (TID) | ORAL | 0 refills | Status: DC

## 2022-08-04 MED ORDER — CEPHALEXIN 500 MG PO CAPS: 500.0000 mg | ORAL_CAPSULE | Freq: Four times a day (QID) | ORAL | 0 refills | Status: DC

## 2022-08-04 MED ORDER — HYDROCODONE-ACETAMINOPHEN 5-325 MG PO TABS
1.0000 | ORAL_TABLET | Freq: Once | ORAL | Status: AC
  Administered 2022-08-04: 1 via ORAL
  Filled 2022-08-04: qty 1

## 2022-08-04 NOTE — Discharge Instructions (Addendum)
You were diagnosed with a urinary tract infection today.  We have given you 1 round of antibiotics in the emergency department this evening and I have also sent you in a prescription for Keflex for you to take as prescribed in its entirety for management of your symptoms.  I have also sent a prescription for Pyridium which can help with pain from a urinary tract infection.  I recommend that you follow-up with your primary care doctor in the next few days for continued evaluation and management of your symptoms  Return if development of any new or worsening symptoms.

## 2022-08-04 NOTE — ED Provider Notes (Signed)
[facility] EMERGENCY DEPARTMENT Provider Note   CSN: [id number] Arrival date & time: 08/04/22  1645     History {Add pertinent medical, surgical, social history, OB history to HPI:1} Chief Complaint  Patient presents with   Flank Pain    Melanie Spencer is a 62 y.o. 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 History obtained from pt and husband. Patient also reports urgency and frequency.  She denies fever, chills, nausea, vomiting, chest pain, shortness of breath, new weakness or other new symptoms. Pt/husband deny confusion. She has a longstanding history of mixed stress and urge urinary incontinence decently managed with fesoteridine.  She denies a history of frequent UTIs though did require hospitalization approximately 1 year ago for a urinary tract infection and dehydration. She denies a history of kidney disease or kidney stones.      Home Medications Prior to Admission medications   Medication Sig Start Date End Date Taking? Authorizing Provider  acetaminophen (TYLENOL) 325 MG tablet Take 2 tablets (650 mg total) by mouth every 6 (six) hours as needed for mild pain (or Fever >/= 101). 10/17/21   Sheikh, Omair Latif, DO  acidophilus (RISAQUAD) CAPS capsule Take 2 capsules by mouth daily. 10/18/21   Marguerita Merles Latif, DO  amLODipine (NORVASC) 2.5 MG tablet Take 5 mg by mouth daily. 07/03/21   [provider]  aspirin 325 MG tablet Take 325 mg by mouth daily.    [provider]  cholecalciferol (VITAMIN D) 1000 UNITS tablet Take 1,000 Units by mouth daily.     [provider]  diclofenac Sodium (VOLTAREN) 1 % GEL Apply 2 g topically 4 (four) times daily. 10/17/21   Sheikh, Omair Latif, DO  Dulaglutide (TRULICITY) 4.5 MG/0.5ML SOPN Inject 4.5  mg as directed once a week. 03/11/21   Elenore Paddy, NP  fesoterodine (TOVIAZ) 8 MG TB24 tablet Take 1 tablet (8 mg total) by mouth daily. 06/09/22   McKenzie, Mardene Celeste, MD  fluticasone (FLONASE) 50 MCG/ACT nasal spray SHAKE LIQUID AND USE 2 SPRAYS IN EACH NOSTRIL EVERY DAY Patient taking differently: Place 2 sprays into both nostrils daily as needed for allergies. 04/17/21 10/14/21  Elenore Paddy, NP  gabapentin (NEURONTIN) 300 MG capsule Take 600 mg by mouth 2 (two) times daily. 07/03/21   [provider]  glucose blood (ACCU-CHEK GUIDE) test strip TEST FOUR TIMES A DAY 12/27/20   Lilly Cove C, MD  hydrOXYzine (ATARAX) 25 MG tablet Take by mouth. 10/23/21   [provider]  Insulin Pen Needle 29G X 12.7MM MISC 1 Units by Does not apply route daily at 12 noon. 12/02/20   Gosrani, Nimish C, MD  LANTUS SOLOSTAR 100 UNIT/ML Solostar Pen ADMINISTER 40 UNITS UNDER THE SKIN AT BEDTIME Patient taking differently: Inject 40 Units into the skin at bedtime. 05/18/21   Oliver Barre, MD  metFORMIN (GLUCOPHAGE) 1000 MG tablet Take 1 tablet (1,000 mg total) by mouth 2 (two) times daily. 03/19/21 10/14/21  Elenore Paddy, NP  nystatin (MYCOSTATIN/NYSTOP) powder Apply topically 3 (three) times daily. 10/17/21   Marguerita Merles Latif, DO  omeprazole (PRILOSEC) 20 MG capsule Take 1 capsule by mouth daily. 07/03/21   [provider]  ondansetron (ZOFRAN) 4 MG tablet Take 1 tablet (4 mg total) by  mouth every 6 (six) hours as needed for nausea. 10/17/21   Raiford Noble Latif, DO  pioglitazone (ACTOS) 15 MG tablet Take 1 tablet by mouth daily. 07/03/21 07/03/22  [provider]  ramipril (ALTACE) 10 MG capsule TAKE 2 CAPSULES(20 MG) BY MOUTH DAILY Patient taking differently: Take 20 mg by mouth daily. 01/03/21   Doree Albee, MD  simvastatin (ZOCOR) 40 MG tablet Take 1 tablet by mouth at bedtime. 07/03/21   [provider]      Allergies    Patient has no known allergies.     Review of Systems   Review of Systems  Constitutional:  Negative for chills and fever.  HENT:  Negative for ear pain and sore throat.   Eyes:  Negative for pain and visual disturbance.  Respiratory:  Negative for cough, chest tightness and shortness of breath.   Cardiovascular:  Negative for chest pain and palpitations.  Gastrointestinal:  Positive for abdominal pain. Negative for blood in stool, constipation, diarrhea, nausea and vomiting.  Genitourinary:  Positive for difficulty urinating, dysuria, flank pain, frequency and urgency. Negative for decreased urine volume, enuresis and hematuria.  Musculoskeletal:  Positive for back pain. Negative for arthralgias.  Skin:  Negative for color change and rash.  Neurological:  Negative for dizziness, seizures, syncope, facial asymmetry, speech difficulty, weakness, light-headedness, numbness and headaches.  Psychiatric/Behavioral:  Negative for confusion.   All other systems reviewed and are negative.   Physical Exam Updated Vital Signs BP (!) 176/67 (BP Location: Right Arm)   Pulse 67   Temp 97.6 F (36.4 C) (Temporal)   Resp 16   Ht 5' 5.5" (1.664 m)   Wt 108.9 kg   LMP 03/02/2012   SpO2 99%   BMI 39.33 kg/m  Physical Exam Vitals and nursing note reviewed.  Constitutional:      General: She is not in acute distress.    Appearance: Normal appearance. She is not ill-appearing or toxic-appearing.  HENT:     Head: Normocephalic and atraumatic.     Mouth/Throat:     Comments: Mildly dry mucus membranes Eyes:     Conjunctiva/sclera: Conjunctivae normal.  Cardiovascular:     Rate and Rhythm: Normal rate and regular rhythm.     Heart sounds: Normal heart sounds. No murmur heard. Pulmonary:     Effort: Pulmonary effort is normal. No respiratory distress.     Breath sounds: Normal breath sounds. No wheezing, rhonchi or rales.  Abdominal:     General: Abdomen is flat. There is no distension.     Palpations: Abdomen is soft.      Tenderness: There is no abdominal tenderness. There is no right CVA tenderness, left CVA tenderness, guarding or rebound.  Musculoskeletal:        General: Normal range of motion.     Cervical back: Neck supple.     Right lower leg: Edema (chronic, pt/husband state at baseline, equal bilaterally, nontender extremity) present.     Left lower leg: Edema (chronic, pt/husband state at baseline, equal bilaterally, nontender extremity) present.  Skin:    General: Skin is warm and dry.     Capillary Refill: Capillary refill takes less than 2 seconds.     Coloration: Skin is not jaundiced or pale.  Neurological:     Mental Status: She is alert. Mental status is at baseline.     Cranial Nerves: No cranial nerve deficit.     Comments: Normal speech, answering questions appropriately and briskly, contracture to left  hand that pt/husband state is unchanged from pt's baseline  Psychiatric:        Mood and Affect: Mood normal.        Behavior: Behavior normal.        Thought Content: Thought content normal.     ED Results / Procedures / Treatments   Labs (all labs ordered are listed, but only abnormal results are displayed) Labs Reviewed  COMPREHENSIVE METABOLIC PANEL - Abnormal; Notable for the following components:      Result Value   Glucose, Bld 109 (*)    BUN 31 (*)    Creatinine, Ser 1.30 (*)    GFR, Estimated 46 (*)    All other components within normal limits  CBC WITH DIFFERENTIAL/PLATELET - Abnormal; Notable for the following components:   Hemoglobin 10.5 (*)    HCT 34.9 (*)    All other components within normal limits  LIPASE, BLOOD  URINALYSIS, ROUTINE W REFLEX MICROSCOPIC    EKG None  Radiology No results found.  Procedures None  Medications Ordered in ED Medications  sodium chloride 0.9 % bolus 500 mL (has no administration in time range)    ED Course/ Medical Decision Making/ A&P                           Medical Decision Making Amount and/or Complexity of  Data Reviewed Labs: ordered. Decision-making details documented in ED Course.   This is a 62 year old female presenting to ED with intermittent right flank pain and dysuria for the last week. She is alert and oriented, abdomen is soft and non-tender with no CVA tenderness, rebound, guarding, or signs of peritonitis on exam. She has residual contracture of left hand she/husband report is at baseline. No new focal weakness, no facial droop on my exam. Pt previously hospitalized in March 2023 for acute metabolic encephalopathy, acute kidney injury, and acute cystitis. Pt/husband report symptoms are similar today though less severe than they were at that time. Pt does not have history of recurrent UTIs and has not required treatment since that previous hospitalization. She is neurologically intact today without signs of acute abdomen. Low suspicion for ureterolithiasis, appendicitis, or acute abdomen with unremarkable vital signs and benign abdominal exam. She did appear a bit dry on my exam and reports not eating well recently due to symptoms so I ordered 586mL bolus for fluid repletion while awaiting lab work. Pending urinalysis at shift change so care signed out to afternoon PA, Sarah Smooth. She does have an acute kidney injury on blood work today. Not currently meeting sepsis criteria with no fever, no tachycardia, and no leukocytosis. Anticipate admission in setting of pt's history of severe UTI but if pt/husband prefer pt may be stable enough for outpatient management. Disposition pending UA and reassessment.   {Document critical care time when appropriate:1} {Document review of labs and clinical decision tools ie heart score, Chads2Vasc2 etc:1}  {Document your independent review of radiology images, and any outside records:1} {Document your discussion with family members, caretakers, and with consultants:1} {Document social determinants of health affecting pt's care:1} {Document your decision making  why or why not admission, treatments were needed:1} Final Clinical Impression(s) / ED Diagnoses Final diagnoses:  Acute kidney injury ([place])    Rx / DC Orders ED Discharge Orders     None

## 2022-08-04 NOTE — ED Notes (Signed)
Pt ambulating to the bathroom with husband

## 2022-08-04 NOTE — ED Triage Notes (Signed)
Pt with right flank pain for a week.  Burning with urination today.  Denies fever or N/V

## 2022-08-04 NOTE — ED Provider Notes (Signed)
Care assumed from [facility], PA-C at shift change. Please see their note for further information.   HPI:   Melanie Spencer is a 62 y.o. 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 History obtained from pt and husband. Patient also reports urgency and frequency.  She denies fever, chills, nausea, vomiting, chest pain, shortness of breath, new weakness or other new symptoms. Pt/husband deny confusion. She has a longstanding history of mixed stress and urge urinary incontinence decently managed with fesoteridine.  She denies a history of frequent UTIs though did require hospitalization approximately 1 year ago for a urinary tract infection and dehydration. She denies a history of kidney disease or kidney stones.    Plan: Creatinine 1.30 up from 0.88 9 months ago. Hemoglobin 10.5 which is baseline. UA pending and shift change.  UA resulted and is infectious. Given Rocephin for same. Will order CT renal study to rule out infected kidney stone.   CT resulted and reveals  1. Small amount of air within the urinary bladder, suspicious for cystitis in the absence of recent instrumentation. Correlate with urinalysis. 2. Otherwise, no acute abdominopelvic findings. Specifically, no evidence of obstructive uropathy. 3. Moderate volume of stool throughout the colon.  I have personally reviewed and interpreted this imaging and agree with radiology interpretation.  Considered admission for patient, however upon reassessment after antibiotics and fluids, feel patient is stable to be discharged home. Will send with Keflex. Patient is understanding and amenable with plan, educated on red flag symptoms that would prompt immediate return. Also emphasized importance of close PCP follow-up.  Patient  discharged in stable condition.  Findings and plan of care discussed with supervising physician Dr. Alvino Chapel who is in agreement.     Nestor Lewandowsky 08/04/22 2132    Davonna Belling, MD 08/04/22 [phone]

## 2022-08-18 IMAGING — MG MM DIGITAL SCREENING BILAT W/ TOMO AND CAD
8 of 14 series · 8 of 40 positions shown · non-contrast
Comparison: Previous exam(s).

ACR Breast Density Category a: The breast tissue is almost entirely
fatty.

CLINICAL DATA: Screening.

EXAM:
DIGITAL SCREENING BILATERAL MAMMOGRAM WITH TOMOSYNTHESIS AND CAD
TECHNIQUE: Bilateral screening digital craniocaudal and mediolateral oblique
mammograms were obtained. Bilateral screening digital breast
tomosynthesis was performed. The images were evaluated with
computer-aided detection.

[R CC synth-2D (1 of 3)]
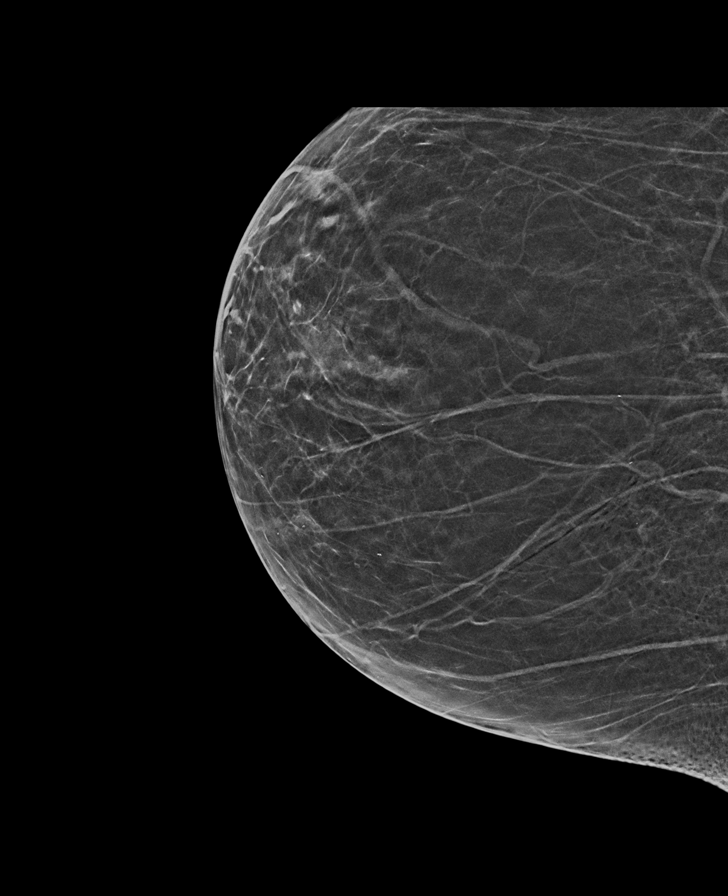

[R CC synth-2D (2 of 3)]
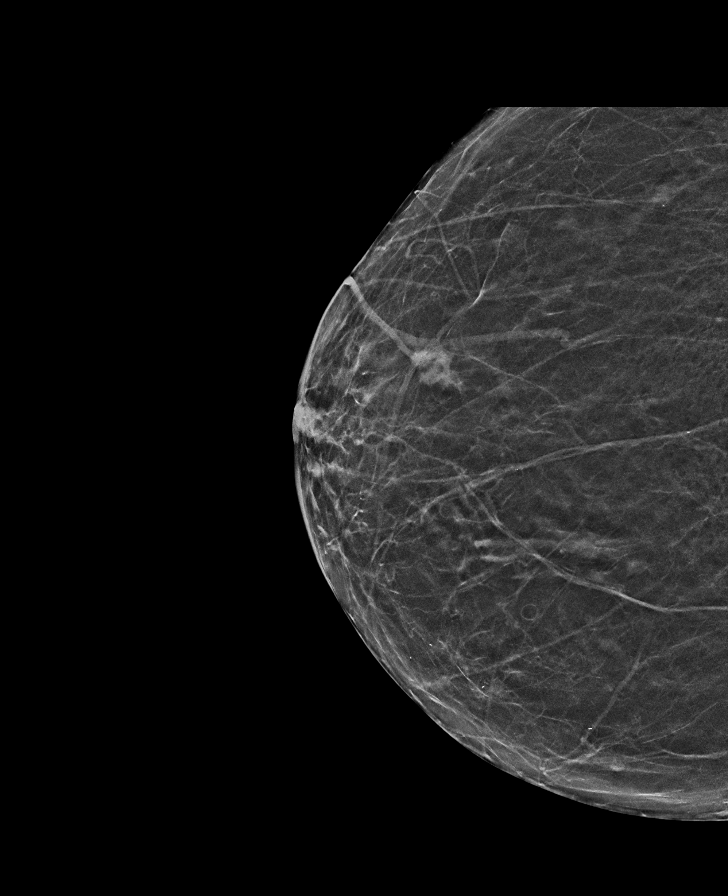

[R CC synth-2D (3 of 3)]
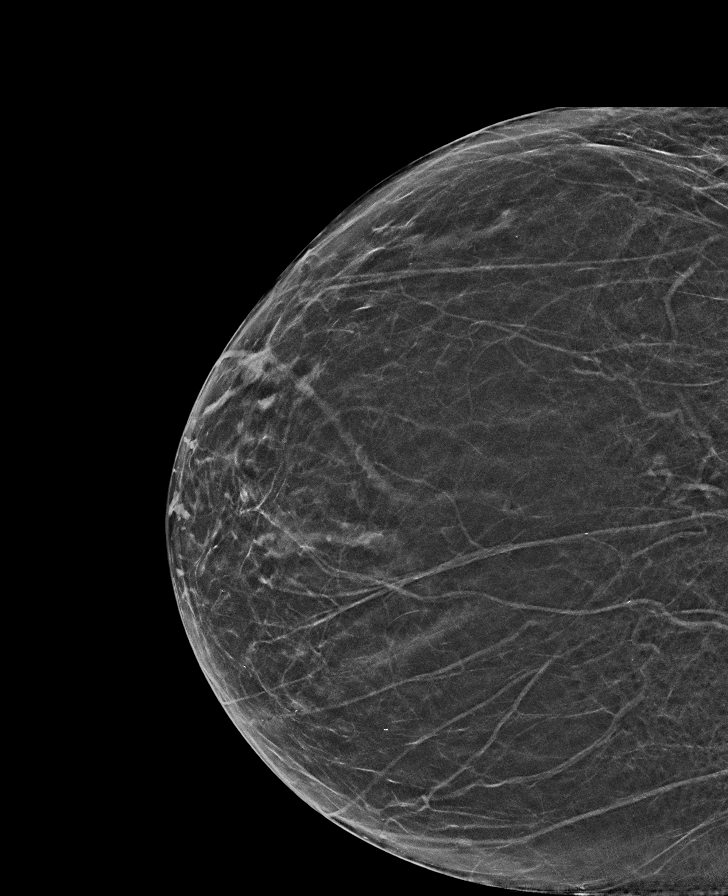

[L MLO synth-2D]
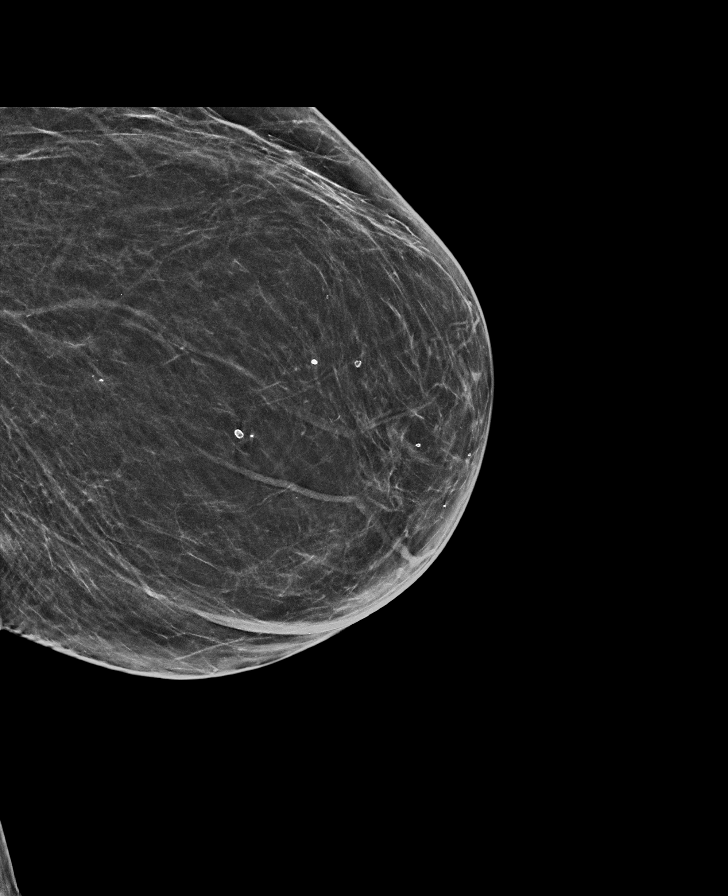

[L CC synth-2D]
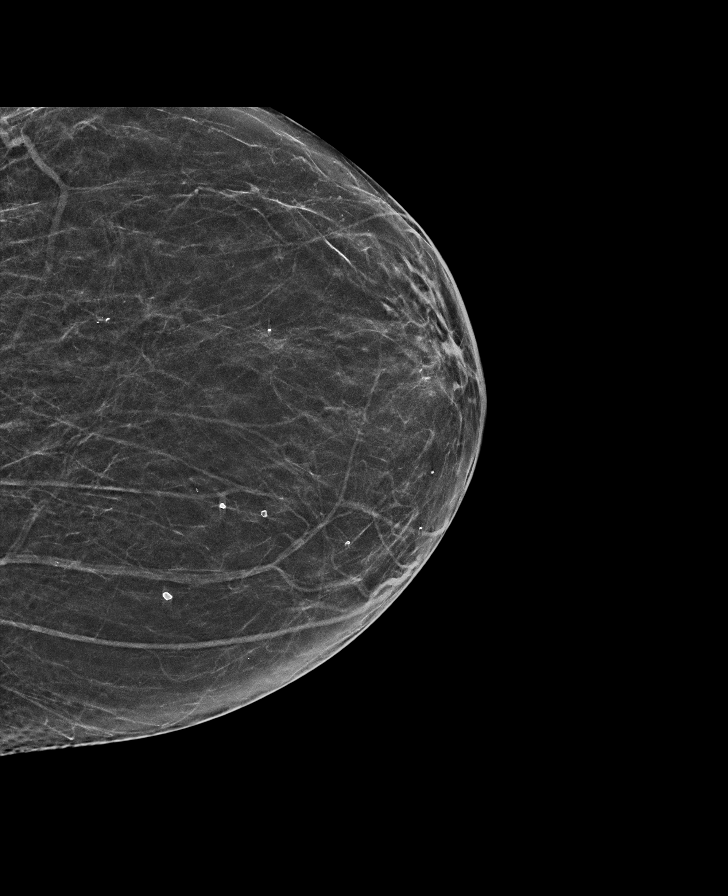

[R MLO synth-2D (1 of 2)]
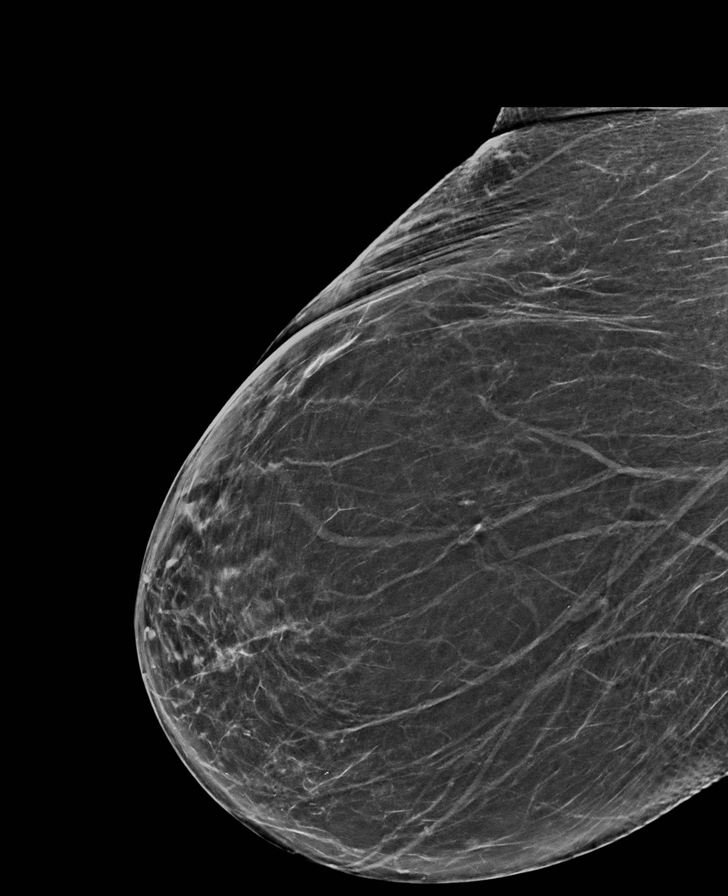

[R MLO synth-2D (2 of 2)]
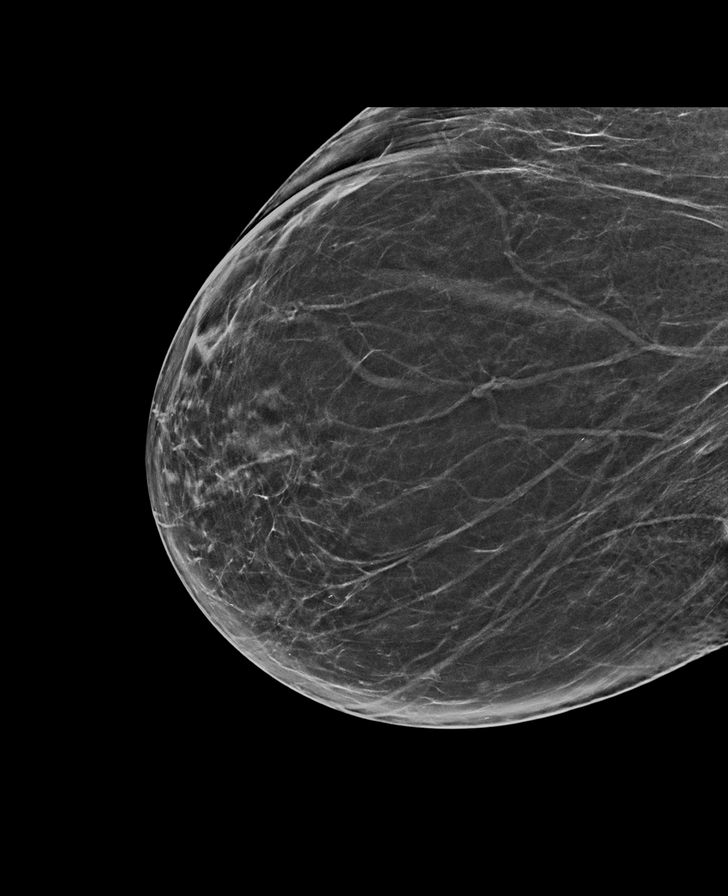

[L CC tomo · tomo slice 29/58.0]
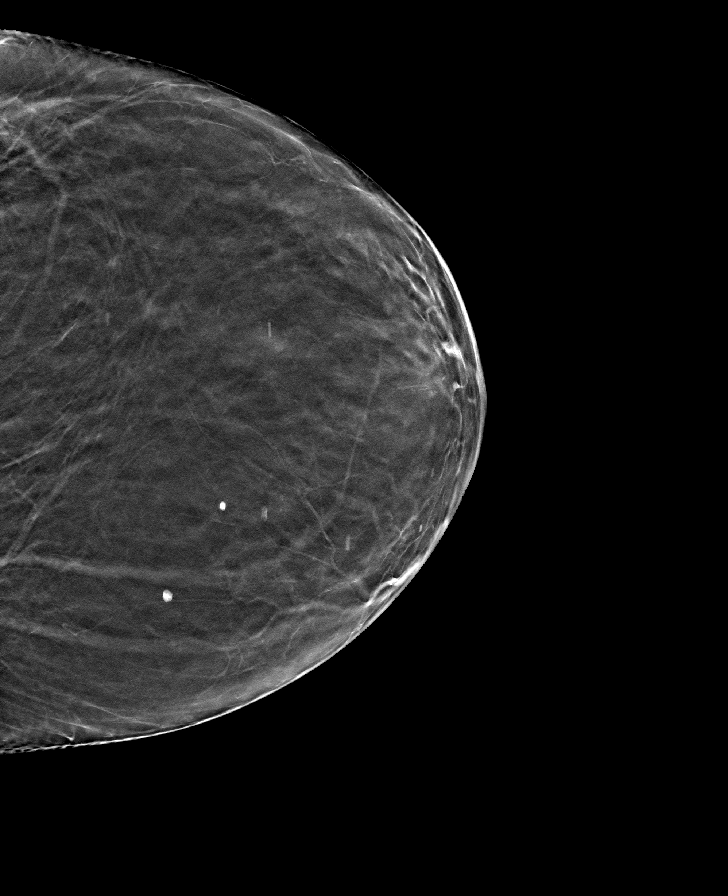

[8 of 40 positions shown; findings below may reference images not displayed]

FINDINGS: There are no findings suspicious for malignancy.
IMPRESSION: No mammographic evidence of malignancy. A result letter of this
screening mammogram will be mailed directly to the patient.

RECOMMENDATION:
Screening mammogram in one year. (Code:0E-3-N98)

BI-RADS CATEGORY  1: Negative.

## 2022-08-24 ENCOUNTER — Ambulatory Visit (INDEPENDENT_AMBULATORY_CARE_PROVIDER_SITE_OTHER): Payer: Medicare PPO | Admitting: Podiatry

## 2022-08-24 DIAGNOSIS — B351 Tinea unguium: Secondary | ICD-10-CM | POA: Diagnosis not present

## 2022-08-24 DIAGNOSIS — E1149 Type 2 diabetes mellitus with other diabetic neurological complication: Secondary | ICD-10-CM | POA: Diagnosis not present

## 2022-08-24 DIAGNOSIS — M79675 Pain in left toe(s): Secondary | ICD-10-CM | POA: Diagnosis not present

## 2022-08-24 DIAGNOSIS — M79674 Pain in right toe(s): Secondary | ICD-10-CM

## 2022-08-31 NOTE — Progress Notes (Signed)
Subjective: 62 y.o. returns the office today for painful, elongated, thickened toenails which she cannot trim herself.  Denies any recent ulcerations or new concerns today.   PCP: Wilburt Finlay, MD-last seen June 29, 2022 Last A1c was 8.5 on 04/22/2022  Objective: AAO 3, NAD DP/PT pulses palpable, CRT less than 3 seconds Protective sensation decreased with Derrel Nip monofilament Dropfoot on the left. Nails hypertrophic, dystrophic, elongated, brittle, discolored 9.There is no surrounding erythema or drainage along the nail sites.  She does get tenderness to nails 1 through 5 on the left and 1 through 4 on the right are thickened elongated. No open lesions. No pain with calf compression, swelling, warmth, erythema.  Assessment: Patient presents with symptomatic onychomycosis  Plan: -Treatment options including alternatives, risks, complications were discussed -Nails sharply debrided 9 without complication/bleeding.   Monitoring signs or symptoms of infection. -Discussed daily foot inspection. If there are any changes, to call the office immediately.  -Follow-up in 3 months or sooner if any problems are to arise. In the meantime, encouraged to call the office with any questions, concerns, changes symptoms.  Celesta Gentile, DPM

## 2022-09-07 ENCOUNTER — Ambulatory Visit: Payer: Medicare PPO | Admitting: Urology

## 2022-10-06 ENCOUNTER — Ambulatory Visit: Payer: Medicare PPO | Admitting: Urology

## 2022-11-22 ENCOUNTER — Ambulatory Visit: Payer: Medicare PPO | Admitting: Podiatry

## 2022-11-23 ENCOUNTER — Ambulatory Visit: Payer: Medicare PPO | Admitting: Podiatry

## 2022-11-23 DIAGNOSIS — M160 Bilateral primary osteoarthritis of hip: Secondary | ICD-10-CM | POA: Insufficient documentation

## 2022-11-24 ENCOUNTER — Ambulatory Visit: Payer: Medicare PPO | Admitting: Urology

## 2022-11-24 VITALS — BP 154/73 | HR 80

## 2022-11-24 DIAGNOSIS — R32 Unspecified urinary incontinence: Secondary | ICD-10-CM | POA: Diagnosis not present

## 2022-11-24 DIAGNOSIS — N3281 Overactive bladder: Secondary | ICD-10-CM | POA: Diagnosis not present

## 2022-11-24 MED ORDER — FESOTERODINE FUMARATE ER 8 MG PO TB24: 8.0000 mg | ORAL_TABLET | Freq: Every day | ORAL | 11 refills | Status: DC

## 2022-11-24 NOTE — Progress Notes (Signed)
11/24/2022 3:44 PM   [name] [birth year] [id number]  Referring provider: Beatrix Fetters, MD [address] [place],  [place] [address]  Followup OAb and urge incontinence   HPI: Ms Hardwicke is a 62yo here for followup for urge incontinence and OAB. She tried [place] last visit which only partially improved her incontinent episodes. Urine stream is strong. No straining to urinate. She has nocturia 3-5x depending on fluid consumtpion. No other complaints today   PMH: Past Medical History:  Diagnosis Date   Cerebrovascular disease 05/23/2019   Essential hypertension, benign 05/23/2019   History of stroke 2007   left leg weakness   HLD (hyperlipidemia) 05/23/2019   Morbid obesity (HCC) 05/23/2019   Type II diabetes mellitus, uncontrolled 05/23/2019   UTI (urinary tract infection)    Vitamin D deficiency     Surgical History: Past Surgical History:  Procedure Laterality Date   AMPUTATION Right 11/09/2019   Procedure: 5TH AMPUTATION RAY;  Surgeon: Vivi Barrack, DPM;  Location: [facility] OR;  Service: Podiatry;  Laterality: Right;   CHOLECYSTECTOMY     COLONOSCOPY N/A 11/12/2020   Procedure: COLONOSCOPY;  Surgeon: Corbin Ade, MD;  Location: [facility] ENDO SUITE;  Service: Endoscopy;  Laterality: N/A;  ASA III / PM procedure   POLYPECTOMY  11/12/2020   Procedure: POLYPECTOMY;  Surgeon: Corbin Ade, MD;  Location: [facility] ENDO SUITE;  Service: Endoscopy;;    Home Medications:  Allergies as of 11/24/2022   No Known Allergies      Medication List        Accurate as of Nov 24, 2022  3:44 PM. If you have any questions, ask your nurse or doctor.          Accu-Chek Guide test strip Generic drug: glucose blood TEST FOUR TIMES A DAY   aspirin 325 MG tablet Take 325 mg by mouth daily.   cephALEXin 500 MG capsule Commonly known as: KEFLEX Take 1 capsule (500 mg total) by mouth 4 (four) times daily.   cholecalciferol 1000 units tablet Commonly known as: VITAMIN D Take 1,000 Units  by mouth daily.   fesoterodine 8 MG Tb24 tablet Commonly known as: TOVIAZ Take 1 tablet (8 mg total) by mouth daily.   fluticasone 50 MCG/ACT nasal spray Commonly known as: FLONASE SHAKE LIQUID AND USE 2 SPRAYS IN EACH NOSTRIL EVERY DAY What changed: See the new instructions.   gabapentin 300 MG capsule Commonly known as: NEURONTIN Take 600 mg by mouth 2 (two) times daily. May take additional capsule if needed for pain   Insulin Pen Needle 29G X 12.7MM Misc 1 Units by Does not apply route daily at 12 noon.   Lantus SoloStar 100 UNIT/ML Solostar Pen Generic drug: insulin glargine ADMINISTER 40 UNITS UNDER THE SKIN AT BEDTIME What changed:  how much to take how to take this when to take this additional instructions   metFORMIN 1000 MG tablet Commonly known as: GLUCOPHAGE Take 1 tablet (1,000 mg total) by mouth 2 (two) times daily.   omeprazole 20 MG capsule Commonly known as: PRILOSEC Take 1 capsule by mouth daily.   phenazopyridine 200 MG tablet Commonly known as: PYRIDIUM Take 1 tablet (200 mg total) by mouth 3 (three) times daily.   pioglitazone 15 MG tablet Commonly known as: ACTOS Take 1 tablet by mouth daily.   ramipril 10 MG capsule Commonly known as: ALTACE TAKE 2 CAPSULES(20 MG) BY MOUTH DAILY What changed: See the new instructions.   simvastatin 40 MG tablet  Commonly known as: ZOCOR Take 1 tablet by mouth at bedtime.   Trulicity 4.5 MG/0.5ML Sopn Generic drug: Dulaglutide Inject 4.5 mg as directed once a week.        Allergies: No Known Allergies  Family History: Family History  Problem Relation Age of Onset   Heart disease Mother    Diabetes Mother    Hypertension Mother    Cancer Father        Lung   Hypertension Sister    Macular degeneration Sister    Appendicitis Brother    Hypertension Sister    Diabetes Sister     Social History:  reports that she quit smoking about 17 years ago. Her smoking use included cigarettes. She has a  40.00 pack-year smoking history. She has never used smokeless tobacco. She reports that she does not drink alcohol and does not use drugs.  ROS: All other review of systems were reviewed and are negative except what is noted above in HPI  Physical Exam: BP (!) 154/73   Pulse 80   LMP 03/02/2012   Constitutional:  Alert and oriented, No acute distress. HEENT: [place] AT, moist mucus membranes.  Trachea midline, no masses. Cardiovascular: No clubbing, cyanosis, or edema. Respiratory: Normal respiratory effort, no increased work of breathing. GI: Abdomen is soft, nontender, nondistended, no abdominal masses GU: No CVA tenderness.  Lymph: No cervical or inguinal lymphadenopathy. Skin: No rashes, bruises or suspicious lesions. Neurologic: Grossly intact, no focal deficits, moving all 4 extremities. Psychiatric: Normal mood and affect.  Laboratory Data: Lab Results  Component Value Date   WBC 6.6 08/04/2022   HGB 10.5 (L) 08/04/2022   HCT 34.9 (L) 08/04/2022   MCV 87.0 08/04/2022   PLT 265 08/04/2022    Lab Results  Component Value Date   CREATININE 1.30 (H) 08/04/2022    No results found for: "PSA"  No results found for: "TESTOSTERONE"  Lab Results  Component Value Date   HGBA1C 7.8 (H) 10/15/2021    Urinalysis    Component Value Date/Time   COLORURINE YELLOW 08/04/2022 1900   APPEARANCEUR CLOUDY (A) 08/04/2022 1900   APPEARANCEUR Cloudy (A) 06/09/2022 1611   LABSPEC 1.027 08/04/2022 1900   PHURINE 5.0 08/04/2022 1900   GLUCOSEU 50 (A) 08/04/2022 1900   HGBUR SMALL (A) 08/04/2022 1900   BILIRUBINUR NEGATIVE 08/04/2022 1900   BILIRUBINUR Negative 06/09/2022 1611   KETONESUR NEGATIVE 08/04/2022 1900   PROTEINUR 100 (A) 08/04/2022 1900   NITRITE NEGATIVE 08/04/2022 1900   LEUKOCYTESUR MODERATE (A) 08/04/2022 1900    Lab Results  Component Value Date   LABMICR See below: 06/09/2022   WBCUA 11-30 (A) 06/09/2022   LABEPIT 0-10 06/09/2022   MUCUS Present 02/01/2022    BACTERIA MANY (A) 08/04/2022    Pertinent Imaging:  No results found for this or any previous visit.  No results found for this or any previous visit.  No results found for this or any previous visit.  No results found for this or any previous visit.  No results found for this or any previous visit.  No valid procedures specified. No results found for this or any previous visit.  Results for orders placed during the hospital encounter of 08/04/22  CT Renal Stone Study  Narrative CLINICAL DATA:  Right flank pain, dysuria  EXAM: CT ABDOMEN AND PELVIS WITHOUT CONTRAST  TECHNIQUE: Multidetector CT imaging of the abdomen and pelvis was performed following the standard protocol without IV contrast.  RADIATION DOSE REDUCTION: This exam was  performed according to the departmental dose-optimization program which includes automated exposure control, adjustment of the mA and/or kV according to patient size and/or use of iterative reconstruction technique.  COMPARISON:  10/14/2021  FINDINGS: Lower chest: Included lung bases are clear. Coronary artery atherosclerosis.  Hepatobiliary: No focal liver abnormality is seen. Status post cholecystectomy. No biliary dilatation.  Pancreas: Unremarkable. No pancreatic ductal dilatation or surrounding inflammatory changes.  Spleen: Normal in size without focal abnormality.  Adrenals/Urinary Tract: Adrenal glands are unremarkable. Kidneys are normal, without renal calculi, focal lesion, or hydronephrosis. Small amount of air is present within the urinary bladder.  Stomach/Bowel: Stomach is within normal limits. Appendix appears normal. No evidence of bowel wall thickening, distention, or inflammatory changes. Moderate volume of stool throughout the colon.  Vascular/Lymphatic: Aortic atherosclerosis. No enlarged abdominal or pelvic lymph nodes.  Reproductive: Uterus and bilateral adnexa are unremarkable.  Other: No free  fluid. No abdominopelvic fluid collection. No pneumoperitoneum. No abdominal wall hernia.  Musculoskeletal: Grade 2 anterolisthesis of L5 on S1 secondary to bilateral pars interarticularis defects, unchanged. 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 No new or acute bony findings.  IMPRESSION: 1. Small amount of air within the urinary bladder, suspicious for cystitis in the absence of recent instrumentation. Correlate with urinalysis. 2. Otherwise, no acute abdominopelvic findings. Specifically, no evidence of obstructive uropathy. 3. Moderate volume of stool throughout the colon. 4. Aortic and coronary artery atherosclerosis (ICD10-I70.0).   Electronically Signed By: Duanne Guess D.O. On: 08/04/2022 20:49   Assessment & Plan:    1. Urinary incontinence, unspecified type We discussed PTNS, intravesical botox and interstim. After discussing the options the patient wishes to proceed with intervesical botox - Urinalysis, Routine w reflex microscopic  2. OAB (overactive bladder) Patient wishes to proceed with intravesical botox   No follow-ups on file.  Wilkie Aye, MD  [facility] [place]

## 2022-11-26 ENCOUNTER — Ambulatory Visit (INDEPENDENT_AMBULATORY_CARE_PROVIDER_SITE_OTHER): Payer: Medicare PPO

## 2022-11-26 DIAGNOSIS — R8271 Bacteriuria: Secondary | ICD-10-CM

## 2022-11-26 DIAGNOSIS — R32 Unspecified urinary incontinence: Secondary | ICD-10-CM

## 2022-11-26 DIAGNOSIS — N3281 Overactive bladder: Secondary | ICD-10-CM

## 2022-11-26 LAB — MICROSCOPIC EXAMINATION

## 2022-11-26 LAB — URINALYSIS, ROUTINE W REFLEX MICROSCOPIC
Bilirubin, UA: NEGATIVE
Nitrite, UA: NEGATIVE
Specific Gravity, UA: 1.025 (ref 1.005–1.030)
Urobilinogen, Ur: 0.2 mg/dL (ref 0.2–1.0)
pH, UA: 5 (ref 5.0–7.5)

## 2022-11-26 NOTE — Progress Notes (Deleted)
Patient was an office visit 05/01 and drop off urine today due to unable to void at office visit. Janaysia Mcleroy, CMA   

## 2022-11-26 NOTE — Addendum Note (Signed)
Addended by: Gustavus Messing on: 11/26/2022 11:42 AM   Modules accepted: Orders

## 2022-11-26 NOTE — Progress Notes (Signed)
Patient was an office visit 05/01 and drop off urine today due to unable to void at office visit. [id number], CMA

## 2022-11-30 ENCOUNTER — Encounter: Payer: Self-pay | Admitting: Urology

## 2022-11-30 LAB — URINE CULTURE

## 2022-11-30 NOTE — Patient Instructions (Signed)
Botulinum Toxin Bladder Injection  A botulinum toxin bladder injection is a procedure to treat an overactive bladder. During the procedure, a drug called botulinum toxin is injected into the bladder through a long, thin needle. This drug relaxes the bladder muscles and reduces overactivity. You may need this procedure if your medicines are not working or you cannot take them. The procedure may be repeated as needed. The treatment is done once and it usually lasts for 6 months. Your health care provider will monitor you to see how well you respond. Tell a health care provider about: Any allergies you have. All medicines you are taking, including vitamins, herbs, eye drops, creams, and over-the-counter medicines. Any problems you or family members have had with anesthetic medicines. Any bleeding problems you have. Any surgeries you have had. Any medical conditions you have. Any previous reactions to a botulinum toxin injection. Any symptoms of urinary tract infection. These include chills, fever, a burning feeling when passing urine, and needing to pass urine often. Whether you are pregnant or may be pregnant. What are the risks? Generally this is a safe procedure. However, problems may occur, including: Not being able to pass urine. If this happens, you may need to have your bladder emptied with a thin tube (urinary catheter). Bleeding. Urinary tract infection. Allergic reaction to the botulinum toxin. Pain or burning when passing urine. Damage to nearby structures or organs. What happens before the procedure? When to stop eating and drinking Follow instructions from your health care provider about what you may eat and drink before your procedure. These may include: 8 hours before the procedure Stop eating most foods. Do not eat meat, fried foods, or fatty foods. Eat only light foods, such as toast or crackers. All liquids are okay except energy drinks and alcohol. 6 hours before the  procedure Stop eating. Drink only clear liquids, such as water, clear fruit juice, black coffee, plain tea, and sports drinks. Do not drink energy drinks or alcohol. 2 hours before the procedure Stop drinking all liquids. You may be allowed to take medicines with small sips of water. If you do not follow your health care provider's instructions, your procedure may be delayed or canceled. Medicines Ask your health care provider about: Changing or stopping your regular medicines. This is especially important if you are taking diabetes medicines or blood thinners. Taking medicines such as aspirin and ibuprofen. These medicines can thin your blood. Do not take these medicines unless your health care provider tells you to take them. Taking over-the-counter medicines, vitamins, herbs, and supplements. General instructions Ask your health care provider what steps will be taken to help prevent infection. These steps may include: Removing hair at the procedure site. Washing skin with a germ-killing soap. Taking antibiotic medicine. If you will be going home right after the procedure, plan to have a responsible adult: Take you home from the hospital or clinic. You will not be allowed to drive. Care for you for the time you are told. What happens during the procedure?  You will be asked to empty your bladder. An IV will be inserted into one of your veins. You will be given one or more of the following: A medicine to help you relax (sedative). A medicine to numb the area (local anesthetic). A medicine to make you fall asleep (general anesthetic). A long, thin scope called a cystoscope will be passed into your bladder through the part of the body that carries urine from your bladder (urethra). The cystoscope   will be used to fill your bladder with water. A long needle will be passed through the cystoscope and into the bladder. The botulinum toxin will be injected into your bladder. It may be  injected into multiple areas of your bladder. The cystoscope will be removed and your bladder will be emptied with a urinary catheter. The procedure may vary among health care providers and hospitals. What can I expect after the procedure? After your procedure, it is common to have: Blood-tinged urine. Burning or soreness when you pass urine. Follow these instructions at home: Medicines Take over-the-counter and prescription medicines only as told by your health care provider. If you were prescribed an antibiotic medicine, take it as told by your health care provider. Do not stop using the antibiotic even if you start to feel better. General instructions  If you were given a sedative during the procedure, it can affect you for several hours. Do not drive or operate machinery until your health care provider says that it is safe. Drink enough fluid to keep your urine pale yellow. Return to your normal activities as told by your health care provider. Ask your health care provider what activities are safe for you. Keep all follow-up visits. Contact a health care provider if you have: A fever or chills. Blood-tinged urine for more than one day after your procedure. Worsening pain or burning when you pass urine. Pain or burning when passing urine for more than two days after your procedure. Trouble emptying your bladder. Get help right away if you: Have bright red blood in your urine. Are unable to pass urine. Summary A botulinum toxin bladder injection is a procedure to treat an overactive bladder. This is generally a safe procedure. However, problems may occur, including not being able to pass urine, bleeding, infection, pain, and an allergic reaction to the botulinum toxin. You will be told when to stop eating and drinking, and what medicines to change or stop. Follow instructions carefully. After the procedure, it is common to have blood in your urine and to have soreness or burning when  passing urine. Contact a health care provider if you have a fever, blood in your urine for more than a few days, or trouble passing urine. Get help right away if you have bright red blood in your urine, or if you are unable to pass urine. This information is not intended to replace advice given to you by your health care provider. Make sure you discuss any questions you have with your health care provider. Document Revised: 01/16/2021 Document Reviewed: 01/16/2021 Elsevier Patient Education  2023 Elsevier Inc.  

## 2022-12-01 ENCOUNTER — Telehealth: Payer: Self-pay

## 2022-12-01 MED ORDER — SULFAMETHOXAZOLE-TRIMETHOPRIM 800-160 MG PO TABS: 1.0000 | ORAL_TABLET | Freq: Two times a day (BID) | ORAL | 0 refills | Status: DC

## 2022-12-01 NOTE — Telephone Encounter (Signed)
Patient called and made aware of positive urine culture and Bactrim DS sent to pharmacy per Dr. Ronne Binning.

## 2022-12-13 ENCOUNTER — Ambulatory Visit: Payer: Medicare PPO | Admitting: Podiatry

## 2022-12-13 ENCOUNTER — Telehealth: Payer: Self-pay

## 2022-12-13 DIAGNOSIS — M79675 Pain in left toe(s): Secondary | ICD-10-CM | POA: Diagnosis not present

## 2022-12-13 DIAGNOSIS — B351 Tinea unguium: Secondary | ICD-10-CM | POA: Diagnosis not present

## 2022-12-13 DIAGNOSIS — M79674 Pain in right toe(s): Secondary | ICD-10-CM | POA: Diagnosis not present

## 2022-12-13 DIAGNOSIS — E1149 Type 2 diabetes mellitus with other diabetic neurological complication: Secondary | ICD-10-CM | POA: Diagnosis not present

## 2022-12-13 NOTE — Progress Notes (Signed)
Subjective: Chief Complaint  Patient presents with   Diabetes    Diabetic foot care     62 y.o. returns the office today for painful, elongated, thickened toenails which she cannot trim herself.  Denies any recent ulcerations or new concerns today.   PCP: Beatrix Fetters, MD-last seen November 23, 2022 Last A1c was 8.5 on 04/22/2022  Objective: AAO 3, NAD DP/PT pulses palpable, CRT less than 3 seconds Protective sensation decreased with Dorann Ou monofilament Dropfoot on the left. Nails hypertrophic, dystrophic, elongated, brittle, discolored 9.There is no surrounding erythema or drainage along the nail sites.  She does get tenderness to nails 1 through 5 on the left and 1 through 4 on the right are thickened elongated. No open lesions. No pain with calf compression, swelling, warmth, erythema.  Assessment: Patient presents with symptomatic onychomycosis  Plan: -Treatment options including alternatives, risks, complications were discussed -Nails sharply debrided 9 without complication/bleeding.   Monitoring signs or symptoms of infection. -Discussed daily foot inspection. If there are any changes, to call the office immediately.  -Follow-up in 3 months or sooner if any problems are to arise. In the meantime, encouraged to call the office with any questions, concerns, changes symptoms.  Ovid Curd, DPM

## 2022-12-13 NOTE — Telephone Encounter (Addendum)
I spoke with Melanie Spencer. We have discussed possible surgery dates and 01/20/2023 was agreed upon by all parties. Patient given information about surgery date, what to expect pre-operatively and post operatively.    We discussed that a pre-op nurse will be calling to set up the pre-op visit that will take place prior to surgery. Informed patient that our office will communicate any additional care to be provided after surgery.    Patients questions or concerns were discussed during our call. Advised to call our office should there be any additional information, questions or concerns that arise. Patient verbalized understanding.    Patient aware to hold Trulicity 7 days prior to her procedure.

## 2023-01-03 ENCOUNTER — Telehealth: Payer: Self-pay

## 2023-01-03 NOTE — Telephone Encounter (Signed)
Patient's husband called advising he received a call from the Hospital and they told him he would be required to pay $800.00 prior to the patient's Botox procedure. He advised they are unable to to afford that cost. They just wanted to make you aware and cancel the procedure.

## 2023-01-03 NOTE — Telephone Encounter (Signed)
Please see below.  Can you cancel surgery scheduled for 06/27

## 2023-01-18 ENCOUNTER — Other Ambulatory Visit (HOSPITAL_COMMUNITY): Payer: Medicare PPO

## 2023-01-20 ENCOUNTER — Ambulatory Visit (HOSPITAL_COMMUNITY): Admission: RE | Admit: 2023-01-20 | Payer: Medicare PPO | Source: Home / Self Care | Admitting: Urology

## 2023-01-20 ENCOUNTER — Encounter (HOSPITAL_COMMUNITY): Admission: RE | Payer: Self-pay | Source: Home / Self Care

## 2023-01-20 SURGERY — CYSTOSCOPY, WITH INJECTION OF BLADDER NECK OR BLADDER WALL
Anesthesia: General

## 2023-01-26 ENCOUNTER — Telehealth: Payer: Self-pay

## 2023-01-26 NOTE — Telephone Encounter (Signed)
Patient is aware of Maralyn Sago recommendation and voiced understanding.  Patient states she would like to proceed with PTNS treatment. Patient is aware that PTNS will need authorization and approval.  PTNS needs authorization and approval

## 2023-01-26 NOTE — Telephone Encounter (Signed)
-----   Message from Donnita Falls, FNP sent at 01/26/2023  1:05 PM EDT ----- This patient is on my schedule for Wednesday 02/02/23 for 2 week postop check - she was scheduled for Botox with Dr. Ronne Binning on 01/20/2023 but she canceled procedure due to cost.   Please advise patient that per Dr. Dimas Millin last visit note alternatives at this time for her OAB symptoms are nurse visits weekly x12 for PTNS or InterStim sacral neuromodulation (surgical implant).   If she wants to do InterStim, please schedule surgical consult visit with Dr. Ronne Binning. Unless she has other acute concerns at this time, can cancel appt with me next week and return in 6 months for routine follow up with me or Dr. Ronne Binning. Thanks.

## 2023-02-02 ENCOUNTER — Encounter: Payer: Medicare PPO | Admitting: Urology

## 2023-02-07 NOTE — Telephone Encounter (Signed)
PTNS pending on [employer] Tracking number [id number]

## 2023-02-14 ENCOUNTER — Ambulatory Visit: Payer: Medicare PPO | Admitting: Urology

## 2023-02-17 NOTE — Telephone Encounter (Signed)
I called patient to offer schedule for PTNS. Patient does not wish to move forward with PTNS. Informed patient we had approval from insurance. Patient still decided not to proceed with PTNS>

## 2023-02-17 NOTE — Telephone Encounter (Signed)
Approval PTNS [id number] 02/14/2023-10/28-2024

## 2023-03-16 ENCOUNTER — Ambulatory Visit: Payer: Medicare PPO | Admitting: Urology

## 2023-03-21 ENCOUNTER — Ambulatory Visit: Payer: Medicare PPO | Admitting: Podiatry

## 2023-03-24 ENCOUNTER — Ambulatory Visit (INDEPENDENT_AMBULATORY_CARE_PROVIDER_SITE_OTHER): Payer: Medicare PPO | Admitting: Podiatry

## 2023-03-24 DIAGNOSIS — B351 Tinea unguium: Secondary | ICD-10-CM

## 2023-03-24 DIAGNOSIS — M79674 Pain in right toe(s): Secondary | ICD-10-CM | POA: Diagnosis not present

## 2023-03-24 DIAGNOSIS — E1149 Type 2 diabetes mellitus with other diabetic neurological complication: Secondary | ICD-10-CM | POA: Diagnosis not present

## 2023-03-24 DIAGNOSIS — M79675 Pain in left toe(s): Secondary | ICD-10-CM | POA: Diagnosis not present

## 2023-03-30 NOTE — Progress Notes (Signed)
Subjective: No chief complaint on file.    62 y.o. returns the office today for painful, elongated, thickened toenails which she cannot trim herself.  Denies any recent ulcerations or new concerns today.   PCP: Beatrix Fetters, MD-last seen November 23, 2022 Last A1c was 8.1 on December 31, 2022  Objective: AAO 3, NAD DP/PT pulses palpable, CRT less than 3 seconds Protective sensation decreased with Dorann Ou monofilament Dropfoot on the left. Nails hypertrophic, dystrophic, elongated, brittle, discolored 9.There is no surrounding erythema or drainage along the nail sites.  She does get tenderness to nails 1 through 5 on the left and 1 through 4 on the right are thickened elongated. Incision prior surgery well-healed. No open lesions. No pain with calf compression, swelling, warmth, erythema.  Assessment: Patient presents with symptomatic onychomycosis  Plan: -Treatment options including alternatives, risks, complications were discussed -Nails sharply debrided 9 without complication/bleeding.   Monitoring signs or symptoms of infection. -Discussed daily foot inspection. If there are any changes, to call the office immediately.  -Follow-up in 3 months or sooner if any problems are to arise. In the meantime, encouraged to call the office with any questions, concerns, changes symptoms.  Return in about 3 months (around 06/24/2023).  Ovid Curd, DPM

## 2023-06-27 ENCOUNTER — Ambulatory Visit: Payer: Medicare PPO | Admitting: Podiatry

## 2023-07-14 DIAGNOSIS — Z9181 History of falling: Secondary | ICD-10-CM | POA: Insufficient documentation

## 2023-07-26 DIAGNOSIS — N3281 Overactive bladder: Secondary | ICD-10-CM | POA: Insufficient documentation

## 2023-07-26 DIAGNOSIS — N3941 Urge incontinence: Secondary | ICD-10-CM | POA: Insufficient documentation

## 2023-07-26 NOTE — Progress Notes (Deleted)
 Name: Melanie Spencer DOB: Aug 07, 1960 MRN: [id number]  History of Present Illness: Ms. Melanie Spencer is a 62 y.o. female who presents today for follow up visit at [facility].  ***She is accompanied by ***. - GU history: 1. OAB with urinary frequency, nocturia, urgency, and urge incontinence.  At last visit with Dr. Sherrilee on 11/24/2022: - Toviaz  only partially improved her incontinent episodes.  - Reported nocturia 3-5x depending on fluid intake. - The plan was bladder Botox procedure on 01/20/2023; she canceled due to cost. She was subsequently offered PTNS, which she declined.  Today: She reports ***  She reports {Blank multiple:19197::improved,persistent / unchanged} urinary ***frequency, ***nocturia, ***urgency, and ***urge incontinence. Voiding ***x/day and ***x/night on average. Leaking ***x/day on average; using *** ***pads / ***diapers per day on average.  She {Actions; denies-reports:120008} caffeine intake (*** caffeinated beverages per day on average).  She {Actions; denies-reports:120008} dysuria, gross hematuria, straining to void, or sensations of incomplete emptying.   Fall Screening: Do you usually have a device to assist in your mobility? {yes/no:20286} ***cane / ***walker / ***wheelchair   Medications: Current Outpatient Medications  Medication Sig Dispense Refill   aspirin  325 MG tablet Take 325 mg by mouth daily.     cephALEXin  (KEFLEX ) 500 MG capsule Take 1 capsule (500 mg total) by mouth 4 (four) times daily. (Patient not taking: Reported on 11/24/2022) 20 capsule 0   cholecalciferol (VITAMIN D ) 1000 UNITS tablet Take 1,000 Units by mouth daily.  (Patient not taking: Reported on 11/24/2022)     Dulaglutide  (TRULICITY ) 4.5 MG/0.5ML SOPN Inject 4.5 mg as directed once a week. 2 mL 3   fesoterodine  (TOVIAZ ) 8 MG TB24 tablet Take 1 tablet (8 mg total) by mouth daily. 30 tablet 11   fluticasone  (FLONASE ) 50 MCG/ACT nasal spray SHAKE LIQUID AND USE 2  SPRAYS IN EACH NOSTRIL EVERY DAY (Patient taking differently: Place 2 sprays into both nostrils daily as needed for allergies.) 16 g 3   gabapentin  (NEURONTIN ) 300 MG capsule Take 600 mg by mouth 2 (two) times daily. May take additional capsule if needed for pain     glucose blood (ACCU-CHEK GUIDE) test strip TEST FOUR TIMES A DAY 300 strip 1   Insulin  Pen Needle 29G X 12.7MM MISC 1 Units by Does not apply route daily at 12 noon. 100 each 3   LANTUS  SOLOSTAR 100 UNIT/ML Solostar Pen ADMINISTER 40 UNITS UNDER THE SKIN AT BEDTIME (Patient taking differently: Inject 30-45 Units into the skin at bedtime.) 15 mL 3   metFORMIN  (GLUCOPHAGE ) 1000 MG tablet Take 1 tablet (1,000 mg total) by mouth 2 (two) times daily. 60 tablet 2   omeprazole (PRILOSEC) 20 MG capsule Take 1 capsule by mouth daily.     phenazopyridine  (PYRIDIUM ) 200 MG tablet Take 1 tablet (200 mg total) by mouth 3 (three) times daily. (Patient not taking: Reported on 11/24/2022) 6 tablet 0   pioglitazone (ACTOS) 15 MG tablet Take 1 tablet by mouth daily.     ramipril  (ALTACE ) 10 MG capsule TAKE 2 CAPSULES(20 MG) BY MOUTH DAILY (Patient taking differently: Take 20 mg by mouth daily.) 180 capsule 0   simvastatin  (ZOCOR ) 40 MG tablet Take 1 tablet by mouth at bedtime.     sulfamethoxazole -trimethoprim  (BACTRIM  DS) 800-160 MG tablet Take 1 tablet by mouth every 12 (twelve) hours. 14 tablet 0   No current facility-administered medications for this visit.    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;  Surgeon: Gershon Donnice SAUNDERS, DPM;  Location: [facility] OR;  Service: Podiatry;  Laterality: Right;   CHOLECYSTECTOMY     COLONOSCOPY N/A 11/12/2020   Procedure: COLONOSCOPY;  Surgeon: Shaaron Lamar HERO, MD;  Location: [facility];  Service: Endoscopy;  Laterality: N/A;  ASA III / PM procedure   POLYPECTOMY  11/12/2020   Procedure: POLYPECTOMY;  Surgeon: Shaaron Lamar HERO, MD;  Location: [facility];  Service: Endoscopy;;   Family History  Problem Relation Age of Onset   Heart disease Mother    Diabetes Mother    Hypertension Mother    Cancer Father        Lung   Hypertension Sister    Macular degeneration Sister    Appendicitis Brother    Hypertension Sister    Diabetes Sister    Social History   Socioeconomic History   Marital status: Married    Spouse name: Not on file   Number of children: Not on file   Years of education: 13   Highest education level: Some college, no degree  Occupational History   Occupation: Disability    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  Tobacco Use   Smoking status: Former    Current packs/day: 0.00    Average packs/day: 2.0 packs/day for 20.0 years (40.0 ttl pk-yrs)    Types: Cigarettes    Start date: 07/26/1985    Quit date: 07/26/2005    Years since quitting: 18.0   Smokeless tobacco: Never  Vaping Use   Vaping status: Never Used  Substance and Sexual Activity   Alcohol use: No    Alcohol/week: 0.0 standard drinks of alcohol   Drug use: No   Sexual activity: Not Currently    Birth control/protection: Post-menopausal  Other Topics Concern   Not on file  Social History Narrative   Married for 31 yrs.On disability secondary to CVA.Previously licensed advertising account planner.   Social Drivers of Corporate Investment Banker Strain: Low Risk  (11/23/2022)   [facility]   Overall Financial Resource Strain (CARDIA)    Difficulty of Paying Living Expenses: Not very hard  Food Insecurity: No Food Insecurity  (07/14/2023)   Received from [facility]   Hunger Vital Sign    Worried About Running Out of Food in the Last Year: Never true    Ran Out of Food in the Last Year: Never true  Transportation Needs: No Transportation Needs (07/14/2023)   Received from [facility] - Transportation    Lack of Transportation (Medical): No    Lack of Transportation (Non-Medical): No  Physical Activity: Inactive (07/14/2023)   Received from [facility]   Exercise Vital Sign    Days of Exercise per Week: 0 days    Minutes of Exercise per Session: 0 min  Stress: No Stress Concern Present (07/14/2023)   Received from [facility] - Occupational Stress Questionnaire    Feeling of Stress : Only a little  Social Connections: Moderately Integrated (07/14/2023)   [facility]   Social Connection and Isolation Panel [NHANES]    Frequency of Communication with Friends and Family: Three times a week    Frequency of Social Gatherings with Friends and Family: Three times a week    Attends Religious Services: More than 4 times per year    Active Member of Clubs or Organizations: No    Attends Banker Meetings: Never    Marital Status: Married  Catering Manager Violence: Not At Risk (04/22/2022)   Received from [facility]   Humiliation, Afraid, Rape, and Kick questionnaire    Fear of Current or Ex-Partner: No    Emotionally Abused: No    Physically Abused: No    Sexually Abused: No    Review of Systems Constitutional: Patient denies any unintentional weight loss or change in strength lntegumentary: Patient denies any rashes or pruritus Eyes: Patient {Actions; denies-reports:120008} dry eyes ENT: Patient {Actions; denies-reports:120008} dry mouth Cardiovascular: Patient denies chest pain or syncope Respiratory: Patient denies shortness of breath Gastrointestinal: Patient ***denies nausea,  vomiting, constipation, or diarrhea Musculoskeletal: Patient denies muscle cramps or weakness Neurologic: Patient denies convulsions or seizures Allergic/Immunologic: Patient denies recent allergic reaction(s) Hematologic/Lymphatic: Patient denies bleeding tendencies Endocrine: Patient denies heat/cold intolerance  GU: As per HPI.  OBJECTIVE There were no vitals filed for this visit. There is no height or weight on file to calculate BMI.  Physical Examination Constitutional: No obvious distress; patient is non-toxic appearing  Cardiovascular: No visible lower extremity edema.  Respiratory: The patient does not have audible wheezing/stridor; respirations do not appear labored  Gastrointestinal: Abdomen non-distended Musculoskeletal: Normal ROM of UEs  Skin: No obvious rashes/open sores  Neurologic: CN 2-12 grossly intact Psychiatric: Answered questions appropriately with normal affect  Hematologic/Lymphatic/Immunologic: No obvious bruises or sites of spontaneous bleeding  ***Urine microscopy: ***negative *** WBC/hpf, *** RBC/hpf, *** bacteria ***UA: ***negative *** WBC/hpf, *** RBC/hpf, *** bacteria ***with no evidence of UTI ***with no evidence of microscopic hematuria ***otherwise unremarkable  PVR: *** ml  ASSESSMENT No diagnosis found. *** We discussed the symptoms of overactive bladder (OAB), which include urinary urgency, frequency, nocturia, with or without urge incontinence.   While we may not know the exact etiology of OAB, several risk factors can be identified.  - We discussed this patient's neurogenic risk factors for OAB-type symptoms including ***T2DM, ***nicotine use, ***.  - Likely exacerbated by ***diuretic use, ***caffeine intake, ***consumption of bladder irritants such as (acidic foods, spicy foods, alcohol).   We discussed the following management options in detail including potential benefits, risks, and side effects: Behavioral therapy: Modify fluid  intake Decreasing bladder irritants (such as caffeine, acidic foods, spicy foods, alcohol) Urge suppression strategies Bladder retraining / timed voiding Double voiding Medication(s): - For anticholinergic medications, we discussed the potential side effects of anticholinergics including dry eyes, dry mouth, constipation, cognitive impairment and urinary retention.  - For beta-3 agonist medication, we discussed the risk for urinary retention and the potential side effect of elevated blood pressure specific to Myrbetriq  (which is more likely to occur in individuals with uncontrolled hypertension).  For refractory cases: PTNS (posterior tibial nerve stimulation) Sacral neuromodulation trial (Medtronic lnterStim or Axonics implant) Bladder Botox injections In extreme cases, SP tube placement  ***In order to further evaluate urinary incontinence, She was instructed to complete a 3-day bladder diary.  ***Discussed recommendation for urodynamic testing, which was described in detail including risks such as bleeding,  infection, organ/tissue/nerve damage.  She decided to proceed with *** ***work on behavioral modifications including ***minimizing caffeine intake and working on ***timed voiding.  Will plan for follow up in ***8 weeks / *** months / ***1 year or sooner if needed. Pt verbalized understanding and agreement. All questions were answered.  PLAN Advised the following: *** ***. Minimize caffeine intake. ***. Work on timed voiding. ***. Urge suppression strategies. ***. Double/ triple voiding. ***. No follow-ups on file.  No orders of the defined types were placed in this encounter.   It has been explained that the patient is to follow regularly with their PCP in addition to all other providers involved in their care and to follow instructions provided by these respective offices. Patient advised to contact urology clinic if any urologic-pertaining questions, concerns, new symptoms  or problems arise in the interim period.  There are no Patient Instructions on file for this visit.  Electronically signed by:  Lauraine JAYSON Oz, FNP   07/26/23    10:17 AM

## 2023-08-02 ENCOUNTER — Ambulatory Visit: Payer: Medicare PPO | Admitting: Urology

## 2023-08-02 DIAGNOSIS — N3941 Urge incontinence: Secondary | ICD-10-CM

## 2023-08-02 DIAGNOSIS — N3281 Overactive bladder: Secondary | ICD-10-CM

## 2023-08-22 ENCOUNTER — Ambulatory Visit (INDEPENDENT_AMBULATORY_CARE_PROVIDER_SITE_OTHER): Payer: Medicare PPO

## 2023-08-22 ENCOUNTER — Ambulatory Visit: Payer: Medicare PPO | Admitting: Podiatry

## 2023-08-22 ENCOUNTER — Other Ambulatory Visit (HOSPITAL_COMMUNITY): Payer: Self-pay | Admitting: Internal Medicine

## 2023-08-22 DIAGNOSIS — B351 Tinea unguium: Secondary | ICD-10-CM | POA: Diagnosis not present

## 2023-08-22 DIAGNOSIS — E1149 Type 2 diabetes mellitus with other diabetic neurological complication: Secondary | ICD-10-CM

## 2023-08-22 DIAGNOSIS — L03032 Cellulitis of left toe: Secondary | ICD-10-CM | POA: Diagnosis not present

## 2023-08-22 DIAGNOSIS — M79672 Pain in left foot: Secondary | ICD-10-CM | POA: Diagnosis not present

## 2023-08-22 DIAGNOSIS — M79675 Pain in left toe(s): Secondary | ICD-10-CM | POA: Diagnosis not present

## 2023-08-22 DIAGNOSIS — Z1231 Encounter for screening mammogram for malignant neoplasm of breast: Secondary | ICD-10-CM

## 2023-08-22 DIAGNOSIS — L97522 Non-pressure chronic ulcer of other part of left foot with fat layer exposed: Secondary | ICD-10-CM

## 2023-08-22 DIAGNOSIS — M79674 Pain in right toe(s): Secondary | ICD-10-CM

## 2023-08-22 MED ORDER — DOXYCYCLINE HYCLATE 100 MG PO TABS: 100.0000 mg | ORAL_TABLET | Freq: Two times a day (BID) | ORAL | 0 refills | Status: DC

## 2023-08-22 MED ORDER — MUPIROCIN 2 % EX OINT: 1.0000 | TOPICAL_OINTMENT | Freq: Two times a day (BID) | CUTANEOUS | 2 refills | Status: DC

## 2023-08-27 NOTE — Progress Notes (Signed)
Subjective: Chief Complaint  Patient presents with   dfc    RM#11 DFC/ Patient states fell a few weeks ago and wants the left foot looked at has been treating at home refused xray at this time.    63 y.o. returns the office today for painful, elongated, thickened toenails which she cannot trim herself.    She has new concerns about wound on her left fifth toe.  Her husband states that she had a fall a couple weeks ago.  She does not have any other injuries during the fall but noticed the wound to the foot her husband's been taking care of.  No formal treatment otherwise.  No fevers or chills.  PCP: Beatrix Fetters, MD-last seen August 04, 2023 Last A1c was 9.1 on July 14, 2023  Objective: AAO 3, NAD DP/PT pulses palpable, CRT less than 3 seconds Protective sensation decreased with Dorann Ou monofilament Dropfoot on the left. Nails hypertrophic, dystrophic, elongated, brittle, discolored 9.There is no surrounding erythema or drainage along the nail sites.  She does get tenderness to nails 1 through 5 on the left and 1 through 4 on the right are thickened elongated. Incision prior surgery well-healed. On the sulcus of the left fifth toe there is a ulceration measuring 0.8 x 0.3 x 0.4 cm.  The wound does probe not down to the bone today.  Probe down to the tendon.  There is mild edema of the toe there is no significant cellulitis present.  There is no fluctuation or crepitation but has been noted. No pain with calf compression, swelling, warmth, erythema.  Assessment: Patient presents with symptomatic onychomycosis; ulceration left fifth toe  Plan: Symptomatic onychomycosis -Nails sharply debrided 9 without complication/bleeding.   Monitoring signs or symptoms of infection.  Ulceration left fifth toe -Wound base is granular however it does seem to probe.  Recommend mupirocin ointment dressing changes daily.  Surgical shoe which she already has.  Prescribed doxycycline.   Unfortunate she is at risk of amputation of the toe given her uncontrolled diabetes, ulceration. -Monitor for any clinical signs or symptoms of infection and directed to call the office immediately should any occur or go to the ER.  Radiology: 3 views of the foot were obtained.  X3 views possible however motion does limit evaluation.  There is some edema present of the fifth toe on the proximal thigh.  On the AP view difficult evaluate for fracture and concerned about fracture of the proximal phalanx.  Return in about 2 weeks (around 09/05/2023) for toe ulcer.   Ovid Curd, DPM

## 2023-08-29 ENCOUNTER — Ambulatory Visit (HOSPITAL_COMMUNITY): Payer: Medicare PPO

## 2023-09-08 ENCOUNTER — Ambulatory Visit: Payer: Medicare PPO | Admitting: Podiatry

## 2023-09-15 ENCOUNTER — Ambulatory Visit: Payer: Medicare PPO | Admitting: Podiatry

## 2023-09-19 ENCOUNTER — Ambulatory Visit: Payer: Medicare PPO | Admitting: Podiatry

## 2023-10-10 ENCOUNTER — Ambulatory Visit: Payer: Medicare PPO | Admitting: Podiatry

## 2023-10-10 ENCOUNTER — Encounter: Payer: Self-pay | Admitting: Podiatry

## 2023-10-10 DIAGNOSIS — L97522 Non-pressure chronic ulcer of other part of left foot with fat layer exposed: Secondary | ICD-10-CM | POA: Diagnosis not present

## 2023-10-10 DIAGNOSIS — B351 Tinea unguium: Secondary | ICD-10-CM

## 2023-10-12 NOTE — Progress Notes (Signed)
 Subjective: Chief Complaint  Patient presents with   Foot Ulcer    RM#12 toe ulcer left      63 y.o. returns the office today for follow-up evaluation of the wound on the left fifth toe.  She said that she is doing well and her husband thinks the wound is healed.  Did not see any drainage or pus or increasing swelling or redness.  Does not report any fevers or chills.  They have no other concerns today.  PCP: Beatrix Fetters, MD-last seen August 04, 2023 Last A1c was 9.1 on July 14, 2023  Objective: AAO 3, NAD DP/PT pulses palpable, CRT less than 3 seconds Protective sensation decreased with Dorann Ou monofilament Dropfoot on the left. Nails are mildly hypertrophic, dystrophic, elongated, brittle, discolored 9.There is no surrounding erythema or drainage along the nail sites.  She does get tenderness to nails 1 through 5 on the left and 1 through 4 on the right are thickened elongated. Incision prior surgery well-healed. On the sulcus of the left fifth only of the previous wound this is healed.  There is no skin breakdown.  There is no edema, erythema or signs of infection.  No new ulcerations identified at this time. No pain with calf compression, swelling, warmth, erythema.  Assessment: Patient presents with symptomatic onychomycosis; ulceration left fifth toe  Plan: Symptomatic onychomycosis -Nails sharply debrided 9 without complication/bleeding as a courtesy as they are minimally elongated.  Monitoring signs or symptoms of infection.  Ulceration left fifth toe -Wound appears to have healed.  Monitor for any signs or symptoms of reoccurrence or any new ulcerations.  Discussed importance of daily foot inspection as well as glucose control.  Return in about 3 months (around 01/10/2024) for routine care.  Vivi Barrack DPM

## 2023-10-29 ENCOUNTER — Inpatient Hospital Stay (HOSPITAL_COMMUNITY)

## 2023-10-29 ENCOUNTER — Other Ambulatory Visit: Payer: Self-pay

## 2023-10-29 ENCOUNTER — Encounter (HOSPITAL_COMMUNITY): Payer: Self-pay | Admitting: *Deleted

## 2023-10-29 ENCOUNTER — Inpatient Hospital Stay (HOSPITAL_COMMUNITY)
Admission: EM | Admit: 2023-10-29 | Discharge: 2023-11-08 | DRG: 871 | Disposition: A | Discharge status: Skilled Nursing Facility | Attending: Family Medicine | Admitting: Family Medicine

## 2023-10-29 DIAGNOSIS — D631 Anemia in chronic kidney disease: Secondary | ICD-10-CM | POA: Diagnosis present

## 2023-10-29 DIAGNOSIS — Z1152 Encounter for screening for COVID-19: Secondary | ICD-10-CM | POA: Diagnosis not present

## 2023-10-29 DIAGNOSIS — Z8249 Family history of ischemic heart disease and other diseases of the circulatory system: Secondary | ICD-10-CM | POA: Diagnosis not present

## 2023-10-29 DIAGNOSIS — E66812 Obesity, class 2: Secondary | ICD-10-CM | POA: Diagnosis present

## 2023-10-29 DIAGNOSIS — Z7982 Long term (current) use of aspirin: Secondary | ICD-10-CM

## 2023-10-29 DIAGNOSIS — R6521 Severe sepsis with septic shock: Secondary | ICD-10-CM | POA: Diagnosis not present

## 2023-10-29 DIAGNOSIS — G9341 Metabolic encephalopathy: Secondary | ICD-10-CM | POA: Diagnosis present

## 2023-10-29 DIAGNOSIS — I69354 Hemiplegia and hemiparesis following cerebral infarction affecting left non-dominant side: Secondary | ICD-10-CM | POA: Diagnosis not present

## 2023-10-29 DIAGNOSIS — Z87891 Personal history of nicotine dependence: Secondary | ICD-10-CM

## 2023-10-29 DIAGNOSIS — E1165 Type 2 diabetes mellitus with hyperglycemia: Secondary | ICD-10-CM | POA: Diagnosis present

## 2023-10-29 DIAGNOSIS — Z6841 Body Mass Index (BMI) 40.0 and over, adult: Secondary | ICD-10-CM

## 2023-10-29 DIAGNOSIS — Z83518 Family history of other specified eye disorder: Secondary | ICD-10-CM

## 2023-10-29 DIAGNOSIS — A419 Sepsis, unspecified organism: Secondary | ICD-10-CM | POA: Diagnosis present

## 2023-10-29 DIAGNOSIS — E8770 Fluid overload, unspecified: Secondary | ICD-10-CM | POA: Diagnosis present

## 2023-10-29 DIAGNOSIS — Z860100 Personal history of colon polyps, unspecified: Secondary | ICD-10-CM

## 2023-10-29 DIAGNOSIS — N3001 Acute cystitis with hematuria: Secondary | ICD-10-CM

## 2023-10-29 DIAGNOSIS — N179 Acute kidney failure, unspecified: Secondary | ICD-10-CM | POA: Diagnosis not present

## 2023-10-29 DIAGNOSIS — N170 Acute kidney failure with tubular necrosis: Secondary | ICD-10-CM | POA: Diagnosis not present

## 2023-10-29 DIAGNOSIS — Z79899 Other long term (current) drug therapy: Secondary | ICD-10-CM | POA: Diagnosis not present

## 2023-10-29 DIAGNOSIS — Z7985 Long-term (current) use of injectable non-insulin antidiabetic drugs: Secondary | ICD-10-CM | POA: Diagnosis not present

## 2023-10-29 DIAGNOSIS — R531 Weakness: Principal | ICD-10-CM

## 2023-10-29 DIAGNOSIS — N1831 Chronic kidney disease, stage 3a: Secondary | ICD-10-CM | POA: Diagnosis present

## 2023-10-29 DIAGNOSIS — E1122 Type 2 diabetes mellitus with diabetic chronic kidney disease: Secondary | ICD-10-CM | POA: Diagnosis present

## 2023-10-29 DIAGNOSIS — N390 Urinary tract infection, site not specified: Secondary | ICD-10-CM | POA: Diagnosis present

## 2023-10-29 DIAGNOSIS — E782 Mixed hyperlipidemia: Secondary | ICD-10-CM | POA: Diagnosis present

## 2023-10-29 DIAGNOSIS — J9601 Acute respiratory failure with hypoxia: Secondary | ICD-10-CM | POA: Diagnosis not present

## 2023-10-29 DIAGNOSIS — Z7984 Long term (current) use of oral hypoglycemic drugs: Secondary | ICD-10-CM

## 2023-10-29 DIAGNOSIS — F05 Delirium due to known physiological condition: Secondary | ICD-10-CM | POA: Diagnosis not present

## 2023-10-29 DIAGNOSIS — I129 Hypertensive chronic kidney disease with stage 1 through stage 4 chronic kidney disease, or unspecified chronic kidney disease: Secondary | ICD-10-CM | POA: Diagnosis present

## 2023-10-29 DIAGNOSIS — Z801 Family history of malignant neoplasm of trachea, bronchus and lung: Secondary | ICD-10-CM

## 2023-10-29 DIAGNOSIS — Z751 Person awaiting admission to adequate facility elsewhere: Secondary | ICD-10-CM

## 2023-10-29 DIAGNOSIS — R410 Disorientation, unspecified: Secondary | ICD-10-CM | POA: Diagnosis present

## 2023-10-29 DIAGNOSIS — Z794 Long term (current) use of insulin: Secondary | ICD-10-CM | POA: Diagnosis not present

## 2023-10-29 DIAGNOSIS — Z89421 Acquired absence of other right toe(s): Secondary | ICD-10-CM

## 2023-10-29 DIAGNOSIS — Z833 Family history of diabetes mellitus: Secondary | ICD-10-CM

## 2023-10-29 DIAGNOSIS — N3000 Acute cystitis without hematuria: Secondary | ICD-10-CM | POA: Diagnosis not present

## 2023-10-29 LAB — BLOOD GAS, VENOUS
Acid-base deficit: 2.5 mmol/L — ABNORMAL HIGH (ref 0.0–2.0)
Bicarbonate: 24.2 mmol/L (ref 20.0–28.0)
Drawn by: 27160
O2 Saturation: 39 %
Patient temperature: 37
pCO2, Ven: 48 mmHg (ref 44–60)
pH, Ven: 7.31 (ref 7.25–7.43)
pO2, Ven: <31 mmHg — CL (ref 32–45)

## 2023-10-29 LAB — GLUCOSE, CAPILLARY: Glucose-Capillary: 143 mg/dL — ABNORMAL HIGH (ref 70–99)

## 2023-10-29 LAB — URINALYSIS, ROUTINE W REFLEX MICROSCOPIC
Bilirubin Urine: NEGATIVE
Glucose, UA: >=500 mg/dL — AB
Ketones, ur: NEGATIVE mg/dL
Nitrite: POSITIVE — AB
Protein, ur: 100 mg/dL — AB
RBC / HPF: >50 RBC/hpf (ref 0–5)
Specific Gravity, Urine: 1.021 (ref 1.005–1.030)
WBC, UA: >50 WBC/hpf (ref 0–5)
pH: 5 (ref 5.0–8.0)

## 2023-10-29 LAB — BASIC METABOLIC PANEL WITH GFR
Anion gap: 11 (ref 5–15)
Anion gap: 13 (ref 5–15)
BUN: 31 mg/dL — ABNORMAL HIGH (ref 8–23)
BUN: 37 mg/dL — ABNORMAL HIGH (ref 8–23)
CO2: 20 mmol/L — ABNORMAL LOW (ref 22–32)
CO2: 18 mmol/L — ABNORMAL LOW (ref 22–32)
Calcium: 8.6 mg/dL — ABNORMAL LOW (ref 8.9–10.3)
Calcium: 8.3 mg/dL — ABNORMAL LOW (ref 8.9–10.3)
Chloride: 107 mmol/L (ref 98–111)
Chloride: 107 mmol/L (ref 98–111)
Creatinine, Ser: 1.59 mg/dL — ABNORMAL HIGH (ref 0.44–1.00)
Creatinine, Ser: 1.99 mg/dL — ABNORMAL HIGH (ref 0.44–1.00)
GFR, Estimated: 36 mL/min — ABNORMAL LOW (ref >60)
GFR, Estimated: 28 mL/min — ABNORMAL LOW (ref >60)
Glucose, Bld: 231 mg/dL — ABNORMAL HIGH (ref 70–99)
Glucose, Bld: 163 mg/dL — ABNORMAL HIGH (ref 70–99)
Potassium: 4.2 mmol/L (ref 3.5–5.1)
Potassium: 3.5 mmol/L (ref 3.5–5.1)
Sodium: 138 mmol/L (ref 135–145)
Sodium: 138 mmol/L (ref 135–145)

## 2023-10-29 LAB — HEMOGLOBIN A1C
Hgb A1c MFr Bld: 8.5 % — ABNORMAL HIGH (ref 4.8–5.6)
Hgb A1c MFr Bld: 8.5 % — ABNORMAL HIGH (ref 4.8–5.6)
Mean Plasma Glucose: 197.25 mg/dL
Mean Plasma Glucose: 197.25 mg/dL

## 2023-10-29 LAB — HEPATIC FUNCTION PANEL
ALT: 27 U/L (ref 0–44)
AST: 42 U/L — ABNORMAL HIGH (ref 15–41)
Albumin: 2.5 g/dL — ABNORMAL LOW (ref 3.5–5.0)
Alkaline Phosphatase: 111 U/L (ref 38–126)
Bilirubin, Direct: 0.2 mg/dL (ref 0.0–0.2)
Indirect Bilirubin: 0.5 mg/dL (ref 0.3–0.9)
Total Bilirubin: 0.7 mg/dL (ref 0.0–1.2)
Total Protein: 5.6 g/dL — ABNORMAL LOW (ref 6.5–8.1)

## 2023-10-29 LAB — VITAMIN B12: Vitamin B-12: 169 pg/mL — ABNORMAL LOW (ref 180–914)

## 2023-10-29 LAB — CBC
HCT: 33.6 % — ABNORMAL LOW (ref 36.0–46.0)
HCT: 29.5 % — ABNORMAL LOW (ref 36.0–46.0)
Hemoglobin: 10.1 g/dL — ABNORMAL LOW (ref 12.0–15.0)
Hemoglobin: 9.3 g/dL — ABNORMAL LOW (ref 12.0–15.0)
MCH: 25.5 pg — ABNORMAL LOW (ref 26.0–34.0)
MCH: 26.1 pg (ref 26.0–34.0)
MCHC: 30.1 g/dL (ref 30.0–36.0)
MCHC: 31.5 g/dL (ref 30.0–36.0)
MCV: 84.8 fL (ref 80.0–100.0)
MCV: 82.6 fL (ref 80.0–100.0)
Platelets: 233 10*3/uL (ref 150–400)
Platelets: 152 10*3/uL (ref 150–400)
RBC: 3.96 MIL/uL (ref 3.87–5.11)
RBC: 3.57 MIL/uL — ABNORMAL LOW (ref 3.87–5.11)
RDW: 14.4 % (ref 11.5–15.5)
RDW: 14.9 % (ref 11.5–15.5)
WBC: 10.9 10*3/uL — ABNORMAL HIGH (ref 4.0–10.5)
WBC: 12.2 10*3/uL — ABNORMAL HIGH (ref 4.0–10.5)
nRBC: 0 % (ref 0.0–0.2)
nRBC: 0 % (ref 0.0–0.2)

## 2023-10-29 LAB — HIV ANTIBODY (ROUTINE TESTING W REFLEX): HIV Screen 4th Generation wRfx: NONREACTIVE

## 2023-10-29 LAB — RESP PANEL BY RT-PCR (RSV, FLU A&B, COVID)  RVPGX2
Influenza A by PCR: NEGATIVE
Influenza B by PCR: NEGATIVE
Resp Syncytial Virus by PCR: NEGATIVE
SARS Coronavirus 2 by RT PCR: NEGATIVE

## 2023-10-29 LAB — LACTIC ACID, PLASMA
Lactic Acid, Venous: 3.7 mmol/L (ref 0.5–1.9)
Lactic Acid, Venous: 4.2 mmol/L (ref 0.5–1.9)
Lactic Acid, Venous: 5.2 mmol/L (ref 0.5–1.9)
Lactic Acid, Venous: 4.7 mmol/L (ref 0.5–1.9)

## 2023-10-29 LAB — MRSA NEXT GEN BY PCR, NASAL: MRSA by PCR Next Gen: NOT DETECTED

## 2023-10-29 LAB — CBG MONITORING, ED: Glucose-Capillary: 188 mg/dL — ABNORMAL HIGH (ref 70–99)

## 2023-10-29 LAB — TSH: TSH: 0.751 u[IU]/mL (ref 0.350–4.500)

## 2023-10-29 LAB — FOLATE: Folate: 10.4 ng/mL (ref >5.9)

## 2023-10-29 LAB — T4, FREE: Free T4: 1.07 ng/dL (ref 0.61–1.12)

## 2023-10-29 LAB — PROCALCITONIN: Procalcitonin: 18.27 ng/mL

## 2023-10-29 MED ORDER — SODIUM CHLORIDE 0.9 % IV SOLN: 250.0000 mL | INTRAVENOUS | Status: AC

## 2023-10-29 MED ORDER — PANTOPRAZOLE SODIUM 40 MG PO TBEC
40.0000 mg | DELAYED_RELEASE_TABLET | Freq: Every day | ORAL | Status: DC
  Administered 2023-10-29 – 2023-11-08 (×10): 40 mg via ORAL
  Filled 2023-10-29 (×10): qty 1

## 2023-10-29 MED ORDER — ACETAMINOPHEN 650 MG RE SUPP: 650.0000 mg | Freq: Four times a day (QID) | RECTAL | Status: DC | PRN

## 2023-10-29 MED ORDER — NOREPINEPHRINE 4 MG/250ML-% IV SOLN
2.0000 ug/min | INTRAVENOUS | Status: DC
  Administered 2023-10-29: 2 ug/min via INTRAVENOUS
  Filled 2023-10-29: qty 250

## 2023-10-29 MED ORDER — ACETAMINOPHEN 325 MG PO TABS
650.0000 mg | ORAL_TABLET | Freq: Four times a day (QID) | ORAL | Status: DC | PRN
  Administered 2023-10-29 – 2023-11-07 (×14): 650 mg via ORAL
  Filled 2023-10-29 (×15): qty 2

## 2023-10-29 MED ORDER — SODIUM CHLORIDE 0.9 % IV SOLN
1.0000 g | Freq: Two times a day (BID) | INTRAVENOUS | Status: DC
  Administered 2023-10-29 – 2023-11-03 (×12): 1 g via INTRAVENOUS
  Filled 2023-10-29 (×13): qty 20

## 2023-10-29 MED ORDER — SIMVASTATIN 20 MG PO TABS
40.0000 mg | ORAL_TABLET | Freq: Every day | ORAL | Status: DC
  Administered 2023-10-30 – 2023-11-07 (×9): 40 mg via ORAL
  Filled 2023-10-29 (×9): qty 2

## 2023-10-29 MED ORDER — LINEZOLID 600 MG/300ML IV SOLN
600.0000 mg | Freq: Two times a day (BID) | INTRAVENOUS | Status: AC
Start: 2023-10-29 — End: 2023-10-31
  Administered 2023-10-29 – 2023-10-31 (×5): 600 mg via INTRAVENOUS
  Filled 2023-10-29 (×7): qty 300

## 2023-10-29 MED ORDER — CHLORHEXIDINE GLUCONATE CLOTH 2 % EX PADS
6.0000 | MEDICATED_PAD | Freq: Every day | CUTANEOUS | Status: DC
  Administered 2023-10-30 – 2023-11-03 (×5): 6 via TOPICAL

## 2023-10-29 MED ORDER — ONDANSETRON HCL 4 MG PO TABS: 4.0000 mg | ORAL_TABLET | Freq: Four times a day (QID) | ORAL | Status: DC | PRN

## 2023-10-29 MED ORDER — LACTATED RINGERS IV SOLN: INTRAVENOUS | Status: AC

## 2023-10-29 MED ORDER — ASPIRIN 325 MG PO TABS
325.0000 mg | ORAL_TABLET | Freq: Every day | ORAL | Status: DC
  Administered 2023-10-29 – 2023-11-02 (×4): 325 mg via ORAL
  Filled 2023-10-29 (×4): qty 1

## 2023-10-29 MED ORDER — SODIUM CHLORIDE 0.9 % IV SOLN: 2.0000 g | Freq: Two times a day (BID) | INTRAVENOUS | Status: DC

## 2023-10-29 MED ORDER — CYANOCOBALAMIN 1000 MCG/ML IJ SOLN
1000.0000 ug | Freq: Once | INTRAMUSCULAR | Status: AC
  Administered 2023-10-29: 1000 ug via INTRAMUSCULAR
  Filled 2023-10-29: qty 1

## 2023-10-29 MED ORDER — LACTATED RINGERS IV BOLUS: 1000.0000 mL | Freq: Once | INTRAVENOUS | Status: DC

## 2023-10-29 MED ORDER — ENOXAPARIN SODIUM 40 MG/0.4ML IJ SOSY
40.0000 mg | PREFILLED_SYRINGE | INTRAMUSCULAR | Status: DC
Start: 2023-10-29 — End: 2023-10-30
  Administered 2023-10-29: 40 mg via SUBCUTANEOUS
  Filled 2023-10-29: qty 0.4

## 2023-10-29 MED ORDER — LACTATED RINGERS IV BOLUS
1000.0000 mL | Freq: Once | INTRAVENOUS | Status: AC
Start: 2023-10-29 — End: 2023-10-29
  Administered 2023-10-29: 1000 mL via INTRAVENOUS

## 2023-10-29 MED ORDER — LACTATED RINGERS IV BOLUS
500.0000 mL | Freq: Once | INTRAVENOUS | Status: AC
  Administered 2023-10-29: 500 mL via INTRAVENOUS

## 2023-10-29 MED ORDER — ONDANSETRON HCL 4 MG/2ML IJ SOLN
4.0000 mg | Freq: Four times a day (QID) | INTRAMUSCULAR | Status: DC | PRN
  Administered 2023-10-31: 4 mg via INTRAVENOUS
  Filled 2023-10-29: qty 2

## 2023-10-29 MED ORDER — HYDROCORTISONE SOD SUC (PF) 100 MG IJ SOLR
100.0000 mg | Freq: Three times a day (TID) | INTRAMUSCULAR | Status: DC
  Administered 2023-10-29 – 2023-10-30 (×3): 100 mg via INTRAVENOUS
  Filled 2023-10-29 (×3): qty 2

## 2023-10-29 MED ORDER — LACTATED RINGERS IV BOLUS
1000.0000 mL | Freq: Once | INTRAVENOUS | Status: AC
  Administered 2023-10-29: 1000 mL via INTRAVENOUS

## 2023-10-29 MED ORDER — SODIUM CHLORIDE 0.9 % IV SOLN
2.0000 g | Freq: Once | INTRAVENOUS | Status: AC
  Administered 2023-10-29: 2 g via INTRAVENOUS
  Filled 2023-10-29: qty 12.5

## 2023-10-29 MED ORDER — ACETAMINOPHEN 325 MG PO TABS
ORAL_TABLET | ORAL | Status: AC
Start: 2023-10-29 — End: 2023-10-29
  Filled 2023-10-29: qty 2

## 2023-10-29 MED ORDER — VITAMIN B-12 100 MCG PO TABS
500.0000 ug | ORAL_TABLET | Freq: Every day | ORAL | Status: DC
  Administered 2023-10-31 – 2023-11-08 (×9): 500 ug via ORAL
  Filled 2023-10-29 (×10): qty 5

## 2023-10-29 NOTE — ED Triage Notes (Signed)
 Pt arrives via [facility] from home. Per report, husband called, tried to get her up to get to the toilet, and she was experiencing more weakness than normal- unable to ambulate on her own- normally able to. (previous stroke with left arm paralysis). CBG 256. En route, vitals 97/63, hr 96, 95% RA. Hx of memory issues.

## 2023-10-29 NOTE — H&P (Signed)
 History and Physical    Patient: Melanie Spencer DOB: 1960-09-15 DOA: 10/29/2023 DOS: the patient was seen and examined on 10/29/2023 PCP: Beatrix Fetters, MD  Patient coming from: Home  Chief Complaint:  Chief Complaint  Patient presents with   Weakness   HPI: Melanie Spencer is a 63 year old female with a history of hypertension, hyperlipidemia, diabetes mellitus type 2, stroke with residual left hemiparesis presenting with generalized weakness and confusion that began on 10/25/2023.  The patient has underlying cognitive impairment.  History is obtained from speaking with the patient and spouse at the bedside.  At baseline, the patient is able to ambulate with a cane and standby assist.  However spouse has noted that the patient has had increasing weakness as the week has progressed.  On the evening of 10/28/2023, the patient was not able to get out of bed because of worsening weakness.  Spouse has also endorsed increasing somnolence.  At baseline, the patient speaks fluently, but is pleasantly confused and A&O x 1.  Spouse states that she has been started on Lyrica about a week prior to this admission.  She was previously taking gabapentin.  There is no other new medications. There is been no complaints of chest pain, shortness breath, coughing, hemoptysis, vomiting, diarrhea, abdominal pain.  Oral intake has been decreased this week.  Patient has approximately 20-pack-year history of tobacco after quitting smoking in 2007. In the ED, the patient was afebrile and hemodynamically stable with oxygen saturation 94% room air.  WBC 10.9, hemoglobin 10.1, platelets 233.  Sodium 138, potassium 4.2, bicarbonate 20, serum creatinine 1.59.  Lactic acid 3.7.  UA >50 WBC, >50 RBC.  The patient was started on cefepime.  She was admitted for further evaluation and treatment.  Review of Systems: As mentioned in the history of present illness. All other systems reviewed and are negative. Past Medical  History:  Diagnosis Date   Cerebrovascular disease 05/23/2019   Essential hypertension, benign 05/23/2019   History of stroke 2007   left leg weakness   HLD (hyperlipidemia) 05/23/2019   Morbid obesity (HCC) 05/23/2019   Type II diabetes mellitus, uncontrolled 05/23/2019   UTI (urinary tract infection)    Vitamin D deficiency    Past Surgical History:  Procedure Laterality Date   AMPUTATION Right 11/09/2019   Procedure: 5TH AMPUTATION RAY;  Surgeon: Vivi Barrack, DPM;  Location: [facility] OR;  Service: Podiatry;  Laterality: Right;   CHOLECYSTECTOMY     COLONOSCOPY N/A 11/12/2020   Procedure: COLONOSCOPY;  Surgeon: Corbin Ade, MD;  Location: [facility];  Service: Endoscopy;  Laterality: N/A;  ASA III / PM procedure   POLYPECTOMY  11/12/2020   Procedure: POLYPECTOMY;  Surgeon: Corbin Ade, MD;  Location: [facility];  Service: Endoscopy;;   Social History:  reports that she quit smoking about 18 years ago. Her smoking use included cigarettes. She started smoking about 38 years ago. She has a 40 pack-year smoking history. She has never used smokeless tobacco. She reports that she does not drink alcohol and does not use drugs.  No Known Allergies  Family History  Problem Relation Age of Onset   Heart disease Mother    Diabetes Mother    Hypertension Mother    Cancer Father        Lung   Hypertension Sister    Macular degeneration Sister    Appendicitis Brother    Hypertension Sister    Diabetes Sister  Prior to Admission medications   Medication Sig Start Date End Date Taking? Authorizing Provider  aspirin 325 MG tablet Take 325 mg by mouth daily.    [provider]  cephALEXin (KEFLEX) 500 MG capsule Take 1 capsule (500 mg total) by mouth 4 (four) times daily. Patient not taking: Reported on 10/10/2023 08/04/22   Smoot, Shawn Route, PA-C  cholecalciferol (VITAMIN D) 1000 UNITS tablet Take 1,000 Units by mouth daily.    [provider]  doxycycline  (VIBRA-TABS) 100 MG tablet Take 1 tablet (100 mg total) by mouth 2 (two) times daily. Patient not taking: Reported on 10/10/2023 08/22/23   Vivi Barrack, DPM  Dulaglutide (TRULICITY) 4.5 MG/0.5ML SOPN Inject 4.5 mg as directed once a week. 03/11/21   Elenore Paddy, NP  fesoterodine (TOVIAZ) 8 MG TB24 tablet Take 1 tablet (8 mg total) by mouth daily. 11/24/22   McKenzie, Mardene Celeste, MD  fluticasone (FLONASE) 50 MCG/ACT nasal spray SHAKE LIQUID AND USE 2 SPRAYS IN EACH NOSTRIL EVERY DAY Patient taking differently: Place 2 sprays into both nostrils daily as needed for allergies. 04/17/21 08/04/22  Elenore Paddy, NP  gabapentin (NEURONTIN) 300 MG capsule Take 600 mg by mouth 2 (two) times daily. May take additional capsule if needed for pain 07/03/21   [provider]  glucose blood (ACCU-CHEK GUIDE) test strip TEST FOUR TIMES A DAY 12/27/20   Gosrani, Nimish C, MD  Insulin Pen Needle 29G X 12.7MM MISC 1 Units by Does not apply route daily at 12 noon. 12/02/20   Gosrani, Nimish C, MD  LANTUS SOLOSTAR 100 UNIT/ML Solostar Pen ADMINISTER 40 UNITS UNDER THE SKIN AT BEDTIME Patient taking differently: Inject 30-45 Units into the skin at bedtime. 05/18/21   Oliver Barre, MD  metFORMIN (GLUCOPHAGE) 1000 MG tablet Take 1 tablet (1,000 mg total) by mouth 2 (two) times daily. 03/19/21 08/04/22  Elenore Paddy, NP  mupirocin ointment (BACTROBAN) 2 % Apply 1 Application topically 2 (two) times daily. 08/22/23   Vivi Barrack, DPM  omeprazole (PRILOSEC) 20 MG capsule Take 1 capsule by mouth daily. 07/03/21   [provider]  phenazopyridine (PYRIDIUM) 200 MG tablet Take 1 tablet (200 mg total) by mouth 3 (three) times daily. 08/04/22   Smoot, Sarah A, PA-C  pioglitazone (ACTOS) 15 MG tablet Take 1 tablet by mouth daily. 07/03/21 08/04/22  [provider]  ramipril (ALTACE) 10 MG capsule TAKE 2 CAPSULES(20 MG) BY MOUTH DAILY Patient taking differently: Take 20 mg by mouth daily. 01/03/21    [name], MD  simvastatin (ZOCOR) 40 MG tablet Take 1 tablet by mouth at bedtime. 07/03/21   [provider]  sulfamethoxazole-trimethoprim (BACTRIM DS) 800-160 MG tablet Take 1 tablet by mouth every 12 (twelve) hours. 12/01/22   Malen Gauze, MD    Physical Exam: Vitals:   10/29/23 0345 10/29/23 0530 10/29/23 0615 10/29/23 0721  BP: 122/65 121/67 123/72   Pulse:   (!) 101   Resp:      Temp:    99.8 F (37.7 C)  TempSrc:    Oral  SpO2:   93%   Weight:      Height:       GENERAL:  A&O x 1, NAD, well developed, cooperative, follows commands HEENT: /AT, No thrush, No icterus, No oral ulcers Neck:  No neck mass, No meningismus, soft, supple CV: RRR, no S3, no S4, no rub, no JVD Lungs: Bibasilar crackles but no wheezing.  Good  air movement Abd: soft/NT +BS, nondistended Ext: trace LE edema, no lymphangitis, no cyanosis, no rashes Neuro:  CN II-XII intact, strength 4/5 in RUE, RLE, strength 4/5 LUE, LLE; sensation intact bilateral; no dysmetria; babinski equivocal  Data Reviewed: Data reviewed above the history  Assessment and Plan: Severe sepsis -Presented with tachycardia, tachypnea, confusion -Lactic acid 3.7 -Continue IV fluids -Continue IV cefepime -Follow blood and urine cultures  UTI -UA>50 WBC -Follow urine cultures  Acute metabolic encephalopathy -Secondary to UTI -Check VBG--7.31/48/<31/24 -B12 -Holding Lyrica temporarily  Uncontrolled diabetes mellitus type 2 with hyperglycemia -NovoLog sliding scale -07/14/2023 hemoglobin A1c 9.1 -Start reduced dose Semglee -Hold metformin and pioglitazone  CKD 3a -baseline creatinine 1.3-1.4  Essential hypertension -Holding ramipril secondary to soft BPs  Mixed hyperlipidemia -Continue statin  Morbid obesity -BMI 41.60 -Lifestyle modification  History of stroke -Continue aspirin     Advance Care Planning: FULL  Consults: none  Family Communication: spouse 41/5  Severity of  Illness: The appropriate patient status for this patient is INPATIENT. Inpatient status is judged to be reasonable and necessary in order to provide the required intensity of service to ensure the patient's safety. The patient's presenting symptoms, physical exam findings, and initial radiographic and laboratory data in the context of their chronic comorbidities is felt to place them at high risk for further clinical deterioration. Furthermore, it is not anticipated that the patient will be medically stable for discharge from the hospital within 2 midnights of admission.   * I certify that at the point of admission it is my clinical judgment that the patient will require inpatient hospital care spanning beyond 2 midnights from the point of admission due to high intensity of service, high risk for further deterioration and high frequency of surveillance required.*  Author: Catarina Hartshorn, MD 10/29/2023 8:03 AM  For on call review [email or web address].

## 2023-10-29 NOTE — Sepsis Progress Note (Signed)
 Notified provider of increased lactic acid of 4.2. Requested consideration of ordering additional fluid to meet sepsis protocol and repeat lactic acid.

## 2023-10-29 NOTE — Progress Notes (Signed)
   10/29/23 1229  Assess: MEWS Score  Temp (!) 101.3 F (38.5 C)  BP (!) 98/54  Pulse Rate (!) 115  Resp 20  Assess: MEWS Score  MEWS Temp 1  MEWS Systolic 1  MEWS Pulse 2  MEWS RR 0  MEWS LOC 0  MEWS Score 4  MEWS Score Color Red  Assess: if the MEWS score is Yellow or Red  Were vital signs accurate and taken at a resting state? Yes  Does the patient meet 2 or more of the SIRS criteria? Yes  Does the patient have a confirmed or suspected source of infection? Yes  MEWS guidelines implemented  Yes, red  Treat  MEWS Interventions Considered administering scheduled or prn medications/treatments as ordered  Take Vital Signs  Increase Vital Sign Frequency  Red: Q1hr x2, continue Q4hrs until patient remains green for 12hrs  Escalate  MEWS: Escalate Red: Discuss with charge nurse and notify provider. Consider notifying RRT. If remains red for 2 hours consider need for higher level of care  Notify: Charge Nurse/RN  Name of Charge Nurse/RN Notified Delvonte Berenson RN  Provider Notification  Provider Name/Title Tat  Date Provider Notified 10/29/23  Assess: SIRS CRITERIA  SIRS Temperature  1  SIRS Respirations  0  SIRS Pulse 1  SIRS WBC 0  SIRS Score Sum  2     Dr. Lisabeth Register paged. Awaiting new order.

## 2023-10-29 NOTE — ED Provider Notes (Signed)
 [place] EMERGENCY DEPARTMENT AT [facility] Provider Note   CSN: [id number] Arrival date & time: 10/29/23  0241     History  Chief Complaint  Patient presents with   Weakness    Melanie Spencer is a 63 y.o. female with PMH as listed below who presents accompanied by husband who provides history.  She is brought in by EMS from home.  Husband states that he was try to get her up from bed last night to go to the bathroom but she was so generally weak that she could not even stand up from the bed.  She was unable to ambulate by herself and normally she is able to.  She has a history of a prior stroke with left arm paralysis and some residual confusion and is normally only A&Ox1, but husband states that it is somewhat more confusion and somnolence than normal.  Denies any fever/chills.  Patient endorses suprapubic pain but denies nausea vomiting.  She is A&Ox1 and requires constant attention to stay awake.  Per EMS, CBG 256. En route, vitals 97/63, hr 96, 95% RA. Hx of memory issues.   Past Medical History:  Diagnosis Date   Cerebrovascular disease 05/23/2019   Essential hypertension, benign 05/23/2019   History of stroke 2007   left leg weakness   HLD (hyperlipidemia) 05/23/2019   Morbid obesity (HCC) 05/23/2019   Type II diabetes mellitus, uncontrolled 05/23/2019   UTI (urinary tract infection)    Vitamin D deficiency        Home Medications Prior to Admission medications   Medication Sig Start Date End Date Taking? Authorizing Provider  aspirin 325 MG tablet Take 325 mg by mouth daily.    [provider]  cephALEXin (KEFLEX) 500 MG capsule Take 1 capsule (500 mg total) by mouth 4 (four) times daily. Patient not taking: Reported on 10/10/2023 08/04/22   Smoot, Shawn Route, PA-C  cholecalciferol (VITAMIN D) 1000 UNITS tablet Take 1,000 Units by mouth daily.    [provider]  doxycycline (VIBRA-TABS) 100 MG tablet Take 1 tablet (100 mg total) by mouth 2  (two) times daily. Patient not taking: Reported on 10/10/2023 08/22/23   Vivi Barrack, DPM  Dulaglutide (TRULICITY) 4.5 MG/0.5ML SOPN Inject 4.5 mg as directed once a week. 03/11/21   Elenore Paddy, NP  fesoterodine (TOVIAZ) 8 MG TB24 tablet Take 1 tablet (8 mg total) by mouth daily. 11/24/22   McKenzie, Mardene Celeste, MD  fluticasone (FLONASE) 50 MCG/ACT nasal spray SHAKE LIQUID AND USE 2 SPRAYS IN EACH NOSTRIL EVERY DAY Patient taking differently: Place 2 sprays into both nostrils daily as needed for allergies. 04/17/21 08/04/22  Elenore Paddy, NP  gabapentin (NEURONTIN) 300 MG capsule Take 600 mg by mouth 2 (two) times daily. May take additional capsule if needed for pain 07/03/21   [provider]  glucose blood (ACCU-CHEK GUIDE) test strip TEST FOUR TIMES A DAY 12/27/20   Gosrani, Nimish C, MD  Insulin Pen Needle 29G X 12.7MM MISC 1 Units by Does not apply route daily at 12 noon. 12/02/20   Gosrani, Nimish C, MD  LANTUS SOLOSTAR 100 UNIT/ML Solostar Pen ADMINISTER 40 UNITS UNDER THE SKIN AT BEDTIME Patient taking differently: Inject 30-45 Units into the skin at bedtime. 05/18/21   Oliver Barre, MD  metFORMIN (GLUCOPHAGE) 1000 MG tablet Take 1 tablet (1,000 mg total) by mouth 2 (two) times daily. 03/19/21 08/04/22  Elenore Paddy, NP  mupirocin ointment (BACTROBAN) 2 %  Apply 1 Application topically 2 (two) times daily. 08/22/23   Vivi Barrack, DPM  omeprazole (PRILOSEC) 20 MG capsule Take 1 capsule by mouth daily. 07/03/21   [provider]  phenazopyridine (PYRIDIUM) 200 MG tablet Take 1 tablet (200 mg total) by mouth 3 (three) times daily. 08/04/22   Smoot, Sarah A, PA-C  pioglitazone (ACTOS) 15 MG tablet Take 1 tablet by mouth daily. 07/03/21 08/04/22  [provider]  ramipril (ALTACE) 10 MG capsule TAKE 2 CAPSULES(20 MG) BY MOUTH DAILY Patient taking differently: Take 20 mg by mouth daily. 01/03/21   [name], MD  simvastatin (ZOCOR) 40 MG tablet Take 1 tablet by  mouth at bedtime. 07/03/21   [provider]  sulfamethoxazole-trimethoprim (BACTRIM DS) 800-160 MG tablet Take 1 tablet by mouth every 12 (twelve) hours. 12/01/22   McKenzie, Mardene Celeste, MD      Allergies    Patient has no known allergies.    Review of Systems   Review of Systems A 10 point review of systems was performed and is negative unless otherwise reported in HPI.  Physical Exam Updated Vital Signs BP 121/67   Pulse (!) 101   Temp 98.6 F (37 C) (Oral)   Resp (!) 22   Ht 5\' 5"  (1.651 m)   Wt 113.4 kg   LMP 03/02/2012   SpO2 95%   BMI 41.60 kg/m  Physical Exam General: Obese female, lying in bed.  HEENT: PERRLA, Sclera anicteric, dry mucous membranes, trachea midline.  Cardiology: RRR, no murmurs/rubs/gallops.  Resp: Normal respiratory rate and effort. CTAB, no wheezes, rhonchi, crackles.  Abd: Soft, mild lower abdominal TTP, non-distended. No rebound tenderness or guarding.  GU: Deferred. MSK: No peripheral edema or signs of trauma. Extremities without deformity or TTP. No cyanosis or clubbing. Skin: warm, dry.  Back: No CVA tenderness Neuro: somnolent, oriented x1, CNs II-XII grossly intact. Weak in LUE. Sensation grossly intact.  ED Results / Procedures / Treatments   Labs (all labs ordered are listed, but only abnormal results are displayed) Labs Reviewed  BASIC METABOLIC PANEL WITH GFR - Abnormal; Notable for the following components:      Result Value   CO2 20 (*)    Glucose, Bld 231 (*)    BUN 31 (*)    Creatinine, Ser 1.59 (*)    Calcium 8.6 (*)    GFR, Estimated 36 (*)    All other components within normal limits  CBC - Abnormal; Notable for the following components:   WBC 10.9 (*)    Hemoglobin 10.1 (*)    HCT 33.6 (*)    MCH 25.5 (*)    All other components within normal limits  URINALYSIS, ROUTINE W REFLEX MICROSCOPIC - Abnormal; Notable for the following components:   APPearance CLOUDY (*)    Glucose, UA >=500 (*)    Hgb urine  dipstick MODERATE (*)    Protein, ur 100 (*)    Nitrite POSITIVE (*)    Leukocytes,Ua LARGE (*)    Bacteria, UA MANY (*)    All other components within normal limits  LACTIC ACID, PLASMA - Abnormal; Notable for the following components:   Lactic Acid, Venous 3.7 (*)    All other components within normal limits  CBG MONITORING, ED - Abnormal; Notable for the following components:   Glucose-Capillary 188 (*)    All other components within normal limits  CULTURE, BLOOD (ROUTINE X 2)  CULTURE, BLOOD (ROUTINE X 2)  URINE CULTURE  BLOOD  GAS, VENOUS    EKG None  Radiology No results found.  Procedures .Critical Care  Performed by: Loetta Rough, MD Authorized by: Loetta Rough, MD   Critical care provider statement:    Critical care time (minutes):  30   Critical care was necessary to treat or prevent imminent or life-threatening deterioration of the following conditions:  Sepsis   Critical care was time spent personally by me on the following activities:  Development of treatment plan with patient or surrogate, discussions with consultants, evaluation of patient's response to treatment, examination of patient, ordering and review of laboratory studies, ordering and review of radiographic studies, ordering and performing treatments and interventions, pulse oximetry, re-evaluation of patient's condition and review of old charts   Care discussed with: admitting provider       Medications Ordered in ED Medications  ceFEPIme (MAXIPIME) 2 g in sodium chloride 0.9 % 100 mL IVPB (0 g Intravenous Stopped 10/29/23 0543)  lactated ringers bolus 1,000 mL (0 mLs Intravenous Stopped 10/29/23 0544)    ED Course/ Medical Decision Making/ A&P                          Medical Decision Making Amount and/or Complexity of Data Reviewed Labs: ordered. Decision-making details documented in ED Course.  Risk Decision regarding hospitalization.    This patient presents to the ED for concern of  gen weakness, AMS, this involves an extensive number of treatment options, and is a complaint that carries with it a high risk of complications and morbidity.  I considered the following differential and admission for this acute, potentially life threatening condition.   MDM:    Ddx of generalized weakness, possibly increased acute altered mental status or encephalopathy considered but not limited to: -Infection - ultimately UA does demonstrate UTI, likely the cause of her sxs -No report of head trauma, NCAT on exam -Electrolyte abnormalities or hyper/hypoglycemia -Hypercarbia or hypoxia - VBG pending -ACS or arrhythmia = telemetry  w/ sinus tachycardia, no signs of ischemia   Clinical Course as of 10/29/23 0558  Sat Oct 29, 2023  0359 Urinalysis, Routine w reflex microscopic -Urine, Clean Catch(!) +UTI [HN]  0401 Creatinine(!): 1.59 Recent baseline ~1.3 [HN]  0401 Hemoglobin(!): 10.1 At baseline [HN]  0401 WBC(!): 10.9 +leukocytosis; with mild tachycardia and mild tachypnea w/ source of infection she screens positive for sepsis. Blood cultures/lactate ordered. Will also order fluids and IV abx. [HN]  0404 Reviewed prior culture data.  Has history of Citrobacter, E. coli, and Klebsiella infections that are mostly pansensitive.  However most recent urine culture shows Klebsiella that is resistant to most cephalosporins.  Does appear to be sensitive to cefepime.  Will give IV cefepime here.  That Klebsiella infection was treated with Bactrim p.o. as an outpatient. [HN]  0541 Lactic Acid, Venous(!!): 3.7 Receiving fluids  [HN]    Clinical Course User Index [HN] Loetta Rough, MD    Labs: I Ordered, and personally interpreted labs.  The pertinent results include:  those listed above  Additional history obtained from chart review, husband at bedside.    Cardiac Monitoring: The patient was maintained on a cardiac monitor.  I personally viewed and interpreted the cardiac monitored  which showed an underlying rhythm of: sinus tachycardia  Reevaluation: After the interventions noted above, I reevaluated the patient and found that they have :improved  Social Determinants of Health: Lives with husband  Disposition:  Admit to medicine  Co morbidities that complicate the patient evaluation  Past Medical History:  Diagnosis Date   Cerebrovascular disease 05/23/2019   Essential hypertension, benign 05/23/2019   History of stroke 2007   left leg weakness   HLD (hyperlipidemia) 05/23/2019   Morbid obesity (HCC) 05/23/2019   Type II diabetes mellitus, uncontrolled 05/23/2019   UTI (urinary tract infection)    Vitamin D deficiency      Medicines Meds ordered this encounter  Medications   ceFEPIme (MAXIPIME) 2 g in sodium chloride 0.9 % 100 mL IVPB   lactated ringers bolus 1,000 mL    I have reviewed the patients home medicines and have made adjustments as needed  Problem List / ED Course: Problem List Items Addressed This Visit       Genitourinary   UTI (urinary tract infection)   Other Visit Diagnoses       Generalized weakness    -  Primary     Confusion                       This note was created using dictation software, which may contain spelling or grammatical errors.    Loetta Rough, MD 10/29/23 [phone]

## 2023-10-29 NOTE — Progress Notes (Signed)
 Verbal order Dr Tat In and out catheter obtained and drained 130cc urine sent to lab for culture per MD order.Pt tolerated well. Transferred to ICU via stretcher.

## 2023-10-29 NOTE — ED Notes (Signed)
 Lab called to add on lactic

## 2023-10-29 NOTE — Progress Notes (Signed)
 Responded to nursing call:  red MEWS   Subjective: Pt awake and alert, but somnolent.  Denies f/c, cp, sob, abd pain, headache  Vitals:   10/29/23 0721 10/29/23 0817 10/29/23 1222 10/29/23 1229  BP:  126/67 (!) 87/60 (!) 98/54  Pulse:  (!) 109 (!) 108 (!) 115  Resp:  20 20 20   Temp: 99.8 F (37.7 C) 99.1 F (37.3 C) 99.9 F (37.7 C) (!) 101.3 F (38.5 C)  TempSrc: Oral     SpO2:  95% 93%   Weight:      Height:       CV--RRR Lung--fine bibasilar crackles Abd--soft+BS/NT   Assessment/Plan: Sepsis -now red MEWS, BPs now soft -anticipate may need vasopressors -move to SDU     Catarina Hartshorn, DO [facility]

## 2023-10-29 NOTE — Plan of Care (Signed)

## 2023-10-29 NOTE — Sepsis Progress Note (Addendum)
 Elink monitoring for the code sepsis protocol.  Initial labs & antibiotics given prior to code sepsis order.

## 2023-10-29 NOTE — Hospital Course (Signed)
 63 year old female with a history of hypertension, hyperlipidemia, diabetes mellitus type 2, stroke with residual left hemiparesis presenting with generalized weakness and confusion that began on 10/25/2023.  The patient has underlying cognitive impairment.  History is obtained from speaking with the patient and spouse at the bedside.  At baseline, the patient is able to ambulate with a cane and standby assist.  However spouse has noted that the patient has had increasing weakness as the week has progressed.  On the evening of 10/28/2023, the patient was not able to get out of bed because of worsening weakness.  Spouse has also endorsed increasing somnolence.  At baseline, the patient speaks fluently, but is pleasantly confused and A&O x 1.  Spouse states that she has been started on Lyrica about a week prior to this admission.  She was previously taking gabapentin.  There is no other new medications. There is been no complaints of chest pain, shortness breath, coughing, hemoptysis, vomiting, diarrhea, abdominal pain.  Oral intake has been decreased this week.  Patient has approximately 20-pack-year history of tobacco after quitting smoking in 2007. In the ED, the patient was afebrile and hemodynamically stable with oxygen saturation 94% room air.  WBC 10.9, hemoglobin 10.1, platelets 233.  Sodium 138, potassium 4.2, bicarbonate 20, serum creatinine 1.59.  Lactic acid 3.7.  UA >50 WBC, >50 RBC.  The patient was started on cefepime.  She was admitted for further evaluation and treatment.

## 2023-10-30 ENCOUNTER — Inpatient Hospital Stay (HOSPITAL_COMMUNITY)

## 2023-10-30 DIAGNOSIS — N3001 Acute cystitis with hematuria: Secondary | ICD-10-CM | POA: Diagnosis not present

## 2023-10-30 DIAGNOSIS — A419 Sepsis, unspecified organism: Secondary | ICD-10-CM

## 2023-10-30 DIAGNOSIS — R531 Weakness: Principal | ICD-10-CM

## 2023-10-30 LAB — BLOOD GAS, VENOUS
Acid-base deficit: 5.6 mmol/L — ABNORMAL HIGH (ref 0.0–2.0)
Bicarbonate: 20.1 mmol/L (ref 20.0–28.0)
Drawn by: 27160
O2 Saturation: 71.1 %
Patient temperature: 37.1
pCO2, Ven: 39 mmHg — ABNORMAL LOW (ref 44–60)
pH, Ven: 7.32 (ref 7.25–7.43)
pO2, Ven: 42 mmHg (ref 32–45)

## 2023-10-30 LAB — BASIC METABOLIC PANEL WITH GFR
Anion gap: 15 (ref 5–15)
BUN: 46 mg/dL — ABNORMAL HIGH (ref 8–23)
CO2: 18 mmol/L — ABNORMAL LOW (ref 22–32)
Calcium: 7.9 mg/dL — ABNORMAL LOW (ref 8.9–10.3)
Chloride: 103 mmol/L (ref 98–111)
Creatinine, Ser: 2.58 mg/dL — ABNORMAL HIGH (ref 0.44–1.00)
GFR, Estimated: 20 mL/min — ABNORMAL LOW (ref >60)
Glucose, Bld: 304 mg/dL — ABNORMAL HIGH (ref 70–99)
Potassium: 4.9 mmol/L (ref 3.5–5.1)
Sodium: 136 mmol/L (ref 135–145)

## 2023-10-30 LAB — CBC
HCT: 33.3 % — ABNORMAL LOW (ref 36.0–46.0)
Hemoglobin: 10.1 g/dL — ABNORMAL LOW (ref 12.0–15.0)
MCH: 25.6 pg — ABNORMAL LOW (ref 26.0–34.0)
MCHC: 30.3 g/dL (ref 30.0–36.0)
MCV: 84.5 fL (ref 80.0–100.0)
Platelets: 140 10*3/uL — ABNORMAL LOW (ref 150–400)
RBC: 3.94 MIL/uL (ref 3.87–5.11)
RDW: 15.1 % (ref 11.5–15.5)
WBC: 27.8 10*3/uL — ABNORMAL HIGH (ref 4.0–10.5)
nRBC: 0 % (ref 0.0–0.2)

## 2023-10-30 LAB — C DIFFICILE QUICK SCREEN W PCR REFLEX
C Diff antigen: NEGATIVE
C Diff interpretation: NOT DETECTED
C Diff toxin: NEGATIVE

## 2023-10-30 LAB — GLUCOSE, CAPILLARY
Glucose-Capillary: 278 mg/dL — ABNORMAL HIGH (ref 70–99)
Glucose-Capillary: 366 mg/dL — ABNORMAL HIGH (ref 70–99)

## 2023-10-30 LAB — URINE CULTURE: Culture: NO GROWTH

## 2023-10-30 LAB — LACTIC ACID, PLASMA: Lactic Acid, Venous: 2.6 mmol/L (ref 0.5–1.9)

## 2023-10-30 LAB — BRAIN NATRIURETIC PEPTIDE: B Natriuretic Peptide: 533 pg/mL — ABNORMAL HIGH (ref 0.0–100.0)

## 2023-10-30 MED ORDER — HALOPERIDOL LACTATE 5 MG/ML IJ SOLN
2.0000 mg | Freq: Once | INTRAMUSCULAR | Status: AC
  Administered 2023-10-30: 2 mg via INTRAMUSCULAR
  Filled 2023-10-30: qty 1

## 2023-10-30 MED ORDER — INSULIN ASPART 100 UNIT/ML IJ SOLN
0.0000 [IU] | Freq: Three times a day (TID) | INTRAMUSCULAR | Status: DC
  Administered 2023-10-30 – 2023-10-31 (×3): 5 [IU] via SUBCUTANEOUS
  Administered 2023-10-31 – 2023-11-01 (×3): 3 [IU] via SUBCUTANEOUS
  Administered 2023-11-01 – 2023-11-02 (×3): 5 [IU] via SUBCUTANEOUS
  Administered 2023-11-02: 2 [IU] via SUBCUTANEOUS
  Administered 2023-11-03 (×2): 3 [IU] via SUBCUTANEOUS
  Administered 2023-11-03: 5 [IU] via SUBCUTANEOUS
  Administered 2023-11-04 (×2): 3 [IU] via SUBCUTANEOUS
  Administered 2023-11-04: 5 [IU] via SUBCUTANEOUS
  Administered 2023-11-05: 1 [IU] via SUBCUTANEOUS
  Administered 2023-11-05: 2 [IU] via SUBCUTANEOUS
  Administered 2023-11-06: 7 [IU] via SUBCUTANEOUS
  Administered 2023-11-06 – 2023-11-07 (×2): 3 [IU] via SUBCUTANEOUS
  Administered 2023-11-07: 5 [IU] via SUBCUTANEOUS
  Administered 2023-11-07: 2 [IU] via SUBCUTANEOUS
  Administered 2023-11-08 (×2): 3 [IU] via SUBCUTANEOUS

## 2023-10-30 MED ORDER — LOPERAMIDE HCL 2 MG PO CAPS
4.0000 mg | ORAL_CAPSULE | Freq: Once | ORAL | Status: AC
Start: 2023-10-30 — End: 2023-10-30
  Administered 2023-10-30: 4 mg via ORAL
  Filled 2023-10-30: qty 2

## 2023-10-30 MED ORDER — SALINE SPRAY 0.65 % NA SOLN
1.0000 | NASAL | Status: DC | PRN
  Administered 2023-10-30: 1 via NASAL
  Filled 2023-10-30: qty 44

## 2023-10-30 MED ORDER — POTASSIUM CHLORIDE 10 MEQ/100ML IV SOLN
10.0000 meq | INTRAVENOUS | Status: AC
  Administered 2023-10-30 (×2): 10 meq via INTRAVENOUS
  Filled 2023-10-30 (×2): qty 100

## 2023-10-30 MED ORDER — OXYCODONE HCL 5 MG PO TABS
5.0000 mg | ORAL_TABLET | ORAL | Status: DC | PRN
  Administered 2023-10-30: 5 mg via ORAL
  Filled 2023-10-30: qty 1

## 2023-10-30 MED ORDER — ENOXAPARIN SODIUM 30 MG/0.3ML IJ SOSY
30.0000 mg | PREFILLED_SYRINGE | INTRAMUSCULAR | Status: DC
Start: 2023-10-30 — End: 2023-11-02
  Administered 2023-10-30 – 2023-11-01 (×3): 30 mg via SUBCUTANEOUS
  Filled 2023-10-30 (×3): qty 0.3

## 2023-10-30 MED ORDER — INSULIN GLARGINE-YFGN 100 UNIT/ML ~~LOC~~ SOLN
10.0000 [IU] | Freq: Every day | SUBCUTANEOUS | Status: DC
  Administered 2023-10-30 – 2023-10-31 (×2): 10 [IU] via SUBCUTANEOUS
  Filled 2023-10-30 (×3): qty 0.1

## 2023-10-30 MED ORDER — INSULIN ASPART 100 UNIT/ML IJ SOLN
0.0000 [IU] | Freq: Every day | INTRAMUSCULAR | Status: DC
  Administered 2023-10-30: 5 [IU] via SUBCUTANEOUS
  Administered 2023-11-01 – 2023-11-02 (×2): 2 [IU] via SUBCUTANEOUS
  Administered 2023-11-07: 4 [IU] via SUBCUTANEOUS

## 2023-10-30 MED ORDER — POTASSIUM CHLORIDE CRYS ER 20 MEQ PO TBCR: 40.0000 meq | EXTENDED_RELEASE_TABLET | Freq: Once | ORAL | Status: DC

## 2023-10-30 MED ORDER — AMLODIPINE BESYLATE 5 MG PO TABS
5.0000 mg | ORAL_TABLET | Freq: Every day | ORAL | Status: DC
  Administered 2023-10-30 – 2023-10-31 (×2): 5 mg via ORAL
  Filled 2023-10-30 (×2): qty 1

## 2023-10-30 MED ORDER — HYDRALAZINE HCL 20 MG/ML IJ SOLN
5.0000 mg | Freq: Four times a day (QID) | INTRAMUSCULAR | Status: DC | PRN
  Administered 2023-10-30 – 2023-10-31 (×3): 5 mg via INTRAVENOUS
  Filled 2023-10-30 (×3): qty 1

## 2023-10-30 MED ORDER — LACTATED RINGERS IV BOLUS: 1000.0000 mL | Freq: Once | INTRAVENOUS | Status: DC

## 2023-10-30 NOTE — Progress Notes (Addendum)
 PROGRESS NOTE  Melanie Spencer JYN:[id number] DOB: 01/30/1961 DOA: 10/29/2023 PCP: No primary care provider on file.  Brief History:  63 year old female with a history of hypertension, hyperlipidemia, diabetes mellitus type 2, stroke with residual left hemiparesis presenting with generalized weakness and confusion that began on 10/25/2023.  The patient has underlying cognitive impairment.  History is obtained from speaking with the patient and spouse at the bedside.  At baseline, the patient is able to ambulate with a cane and standby assist.  However spouse has noted that the patient has had increasing weakness as the week has progressed.  On the evening of 10/28/2023, the patient was not able to get out of bed because of worsening weakness.  Spouse has also endorsed increasing somnolence.  At baseline, the patient speaks fluently, but is pleasantly confused and A&O x 1.  Spouse states that she has been started on Lyrica about a week prior to this admission.  She was previously taking gabapentin.  There is no other new medications. There is been no complaints of chest pain, shortness breath, coughing, hemoptysis, vomiting, diarrhea, abdominal pain.  Oral intake has been decreased this week.  Patient has approximately 20-pack-year history of tobacco after quitting smoking in 2007. In the ED, the patient was afebrile and hemodynamically stable with oxygen saturation 94% room air.  WBC 10.9, hemoglobin 10.1, platelets 233.  Sodium 138, potassium 4.2, bicarbonate 20, serum creatinine 1.59.  Lactic acid 3.7.  UA >50 WBC, >50 RBC.  The patient was started on cefepime.  She was admitted for further evaluation and treatment.   Assessment/Plan: Severe sepsis with septic shock -Presented with tachycardia, tachypnea, confusion -Lactic acid 3.7>>5.2 -Patient required Levophed on 10/29/2023>> weaned off and on for 625 -Continue IV fluids -Broadened antibiotic coverage to include Merrem and linezolid -Follow  blood and urine cultures -Personally reviewed chest x-ray--increased interstitial markings   UTI -UA>50 WBC -Unfortunately, initial urine cultures were not sent  Acute respiratory failure with hypoxia -Secondary to fluid overload -Stable on 2 L -10/30/2023 IV furosemide x 1   Acute metabolic encephalopathy -Secondary to UTI -Check VBG--7.32/39/42/20 -B12--169>>> replete -Holding Lyrica temporarily   Uncontrolled diabetes mellitus type 2 with hyperglycemia -NovoLog sliding scale -07/14/2023 hemoglobin A1c 9.1 -Start reduced dose Semglee -Hold metformin and pioglitazone   Acute on chronic CKD 3a -baseline creatinine 1.3-1.4 -Secondary to sepsis and hypotension -Serum creatinine peaked 2.58 -Renal ultrasound   Essential hypertension -Holding ramipril secondary to AKI   Mixed hyperlipidemia -Continue statin   Morbid obesity -BMI 41.60 -Lifestyle modification   History of stroke -Continue aspirin           Family Communication:   Spouse at bedside 10/30/2023  Consultants:  none  Code Status:  FULL   DVT Prophylaxis:  [place] Heparin / [place] Lovenox   Procedures: As Listed in Progress Note Above  Antibiotics: Meropenem 10/29/23>> Linezolid 10/29/23>>    The patient is critically ill with multiple organ systems failure and requires high complexity decision making for assessment and support, frequent evaluation and titration of therapies, application of advanced monitoring technologies and extensive interpretation of multiple databases.  Critical care time - 40 mins.      Subjective: Patient is awake and alert.  She is pleasantly confused.  She complains of some shortness of breath.  She denies any chest pain, abdominal pain, nausea, vomiting or diarrhea  Objective: Vitals:   10/30/23 1200 10/30/23 1230 10/30/23 1251 10/30/23 1300  BP: Marland Kitchen)  188/83 (!) 192/65 (!) 183/91   Pulse: 98 (!) 104 (!) 105   Resp: 19 (!) 23 (!) 21   Temp:    98.8 F (37.1 C)   TempSrc:    Rectal  SpO2: 96% 93%    Weight:      Height:        Intake/Output Summary (Last 24 hours) at 10/30/2023 1326 Last data filed at 10/30/2023 0531 Gross per 24 hour  Intake 4289.48 ml  Output --  Net 4289.48 ml   Weight change:  Exam:  General:  Pt is alert, follows commands appropriately, not in acute distress HEENT: No icterus, No thrush, No neck mass, [place]/AT Cardiovascular: RRR, S1/S2, no rubs, no gallops Respiratory: Bilateral crackles but no wheezing Abdomen: Soft/+BS, non tender, non distended, no guarding Extremities: 1+LE edema, No lymphangitis, No petechiae, No rashes, no synovitis   Data Reviewed: I have personally reviewed following labs and imaging studies Basic Metabolic Panel: Recent Labs  Lab 10/29/23 0300 10/29/23 1341 10/30/23 0513  NA 138 138 136  K 4.2 3.5 4.9  CL 107 107 103  CO2 20* 18* 18*  GLUCOSE 231* 163* 304*  BUN 31* 37* 46*  CREATININE 1.59* 1.99* 2.58*  CALCIUM 8.6* 8.3* 7.9*   Liver Function Tests: Recent Labs  Lab 10/29/23 1341  AST 42*  ALT 27  ALKPHOS 111  BILITOT 0.7  PROT 5.6*  ALBUMIN 2.5*   No results for input(s): "LIPASE", "AMYLASE" in the last 168 hours. No results for input(s): "AMMONIA" in the last 168 hours. Coagulation Profile: No results for input(s): "INR", "PROTIME" in the last 168 hours. CBC: Recent Labs  Lab 10/29/23 0300 10/29/23 1341 10/30/23 0513  WBC 10.9* 12.2* 27.8*  HGB 10.1* 9.3* 10.1*  HCT 33.6* 29.5* 33.3*  MCV 84.8 82.6 84.5  PLT 233 152 140*   Cardiac Enzymes: No results for input(s): "CKTOTAL", "CKMB", "CKMBINDEX", "TROPONINI" in the last 168 hours. BNP: Invalid input(s): "POCBNP" CBG: Recent Labs  Lab 10/29/23 0307 10/29/23 1546  GLUCAP 188* 143*   HbA1C: Recent Labs    10/29/23 0833 10/29/23 1341  HGBA1C 8.5* 8.5*   Urine analysis:    Component Value Date/Time   COLORURINE YELLOW 10/29/2023 0300   APPEARANCEUR CLOUDY (A) 10/29/2023 0300   APPEARANCEUR  Cloudy (A) 11/25/2022 1655   LABSPEC 1.021 10/29/2023 0300   PHURINE 5.0 10/29/2023 0300   GLUCOSEU >=500 (A) 10/29/2023 0300   HGBUR MODERATE (A) 10/29/2023 0300   BILIRUBINUR NEGATIVE 10/29/2023 0300   BILIRUBINUR Negative 11/25/2022 1655   KETONESUR NEGATIVE 10/29/2023 0300   PROTEINUR 100 (A) 10/29/2023 0300   NITRITE POSITIVE (A) 10/29/2023 0300   LEUKOCYTESUR LARGE (A) 10/29/2023 0300   Sepsis Labs: @LABRCNTIP (procalcitonin:4,lacticidven:4) ) Recent Results (from the past 240 hours)  Blood culture (routine x 2)     Status: None (Preliminary result)   Collection Time: 10/29/23  4:34 AM   Specimen: Left Antecubital; Blood  Result Value Ref Range Status   Specimen Description LEFT ANTECUBITAL  Final   Special Requests   Final    BOTTLES DRAWN AEROBIC AND ANAEROBIC Blood Culture adequate volume   Culture   Final    NO GROWTH 1 DAY Performed at [facility], [address] [place], [place] [address]    Report Status PENDING  Incomplete  Blood culture (routine x 2)     Status: None (Preliminary result)   Collection Time: 10/29/23  4:56 AM   Specimen: BLOOD RIGHT HAND  Result Value Ref Range  Status   Specimen Description BLOOD RIGHT HAND  Final   Special Requests   Final    BOTTLES DRAWN AEROBIC ONLY Blood Culture adequate volume   Culture   Final    NO GROWTH 1 DAY Performed at [facility], [address], [place], [place] [address]    Report Status PENDING  Incomplete  Resp panel by RT-PCR (RSV, Flu A&B, Covid) Anterior Nasal Swab     Status: None   Collection Time: 10/29/23  5:58 AM   Specimen: Anterior Nasal Swab  Result Value Ref Range Status   SARS Coronavirus 2 by RT PCR NEGATIVE NEGATIVE Final    Comment: (NOTE) SARS-CoV-2 target nucleic acids are NOT DETECTED.  The SARS-CoV-2 RNA is generally detectable in upper respiratory specimens during the acute phase of infection. The lowest concentration of SARS-CoV-2 viral copies this assay can detect is 138  copies/mL. A negative result does not preclude SARS-Cov-2 infection and should not be used as the sole basis for treatment or other patient management decisions. A negative result may occur with  improper specimen collection/handling, submission of specimen other than nasopharyngeal swab, presence of viral mutation(s) within the areas targeted by this assay, and inadequate number of viral copies(<138 copies/mL). A negative result must be combined with clinical observations, patient history, and epidemiological information. The expected result is Negative.  Fact Sheet for Patients:  BloggerCourse.com  Fact Sheet for Healthcare Providers:  SeriousBroker.it  This test is no t yet approved or cleared by the [place] FDA and  has been authorized for detection and/or diagnosis of SARS-CoV-2 by FDA under an Emergency Use Authorization (EUA). This EUA will remain  in effect (meaning this test can be used) for the duration of the COVID-19 declaration under Section 564(b)(1) of the Act, 21 U.S.C.section 360bbb-3(b)(1), unless the authorization is terminated  or revoked sooner.       Influenza A by PCR NEGATIVE NEGATIVE Final   Influenza B by PCR NEGATIVE NEGATIVE Final    Comment: (NOTE) The Xpert Xpress SARS-CoV-2/FLU/RSV plus assay is intended as an aid in the diagnosis of influenza from Nasopharyngeal swab specimens and should not be used as a sole basis for treatment. Nasal washings and aspirates are unacceptable for Xpert Xpress SARS-CoV-2/FLU/RSV testing.  Fact Sheet for Patients: BloggerCourse.com  Fact Sheet for Healthcare Providers: SeriousBroker.it  This test is not yet approved or cleared by the [place] FDA and has been authorized for detection and/or diagnosis of SARS-CoV-2 by FDA under an Emergency Use Authorization (EUA). This EUA will remain in effect (meaning  this test can be used) for the duration of the COVID-19 declaration under Section 564(b)(1) of the Act, 21 U.S.C. section 360bbb-3(b)(1), unless the authorization is terminated or revoked.     Resp Syncytial Virus by PCR NEGATIVE NEGATIVE Final    Comment: (NOTE) Fact Sheet for Patients: BloggerCourse.com  Fact Sheet for Healthcare Providers: SeriousBroker.it  This test is not yet approved or cleared by the [place] FDA and has been authorized for detection and/or diagnosis of SARS-CoV-2 by FDA under an Emergency Use Authorization (EUA). This EUA will remain in effect (meaning this test can be used) for the duration of the COVID-19 declaration under Section 564(b)(1) of the Act, 21 U.S.C. section 360bbb-3(b)(1), unless the authorization is terminated or revoked.  Performed at [facility], [address], [place], [place] [address]   MRSA Next Gen by PCR, Nasal     Status: None   Collection Time: 10/29/23  1:21 PM  Specimen: Nasal Mucosa; Nasal Swab  Result Value Ref Range Status   MRSA by PCR Next Gen NOT DETECTED NOT DETECTED Final    Comment: (NOTE) The GeneXpert MRSA Assay (FDA approved for NASAL specimens only), is one component of a comprehensive MRSA colonization surveillance program. It is not intended to diagnose MRSA infection nor to guide or monitor treatment for MRSA infections. Test performance is not FDA approved in patients less than 57 years old. Performed at [facility], [address], [place], [place] [address]      Scheduled Meds:  aspirin  325 mg Oral Daily   Chlorhexidine Gluconate Cloth  6 each Topical Q0600   enoxaparin (LOVENOX) injection  30 mg Subcutaneous Q24H   hydrocortisone sod succinate (SOLU-CORTEF) inj  100 mg Intravenous Q8H   pantoprazole  40 mg Oral Daily   simvastatin  40 mg Oral QHS   vitamin B-12  500 mcg Oral Daily   Continuous Infusions:  sodium chloride     linezolid  (ZYVOX) IV 600 mg (10/30/23 1140)   meropenem (MERREM) IV 1 g (10/30/23 1101)   norepinephrine (LEVOPHED) Adult infusion Stopped (10/30/23 0032)    Procedures/Studies: DG Chest Port 1 View Result Date: 10/29/2023 CLINICAL DATA:  Shortness of breath EXAM: PORTABLE CHEST 1 VIEW COMPARISON:  None Available. FINDINGS: Mild cardiomegaly with mild central vascular congestion. No focal consolidation, pleural effusion, pneumothorax. Osseous pathology. IMPRESSION: Mild cardiomegaly with mild central vascular congestion. Electronically Signed   By: Elgie Collard M.D.   On: 10/29/2023 13:54    Catarina Hartshorn, DO  [facility]  If 7PM-7AM, please contact night-coverage [email or web address] Password [email or web address] 10/30/2023, 1:26 PM   LOS: 1 day

## 2023-10-30 NOTE — Plan of Care (Signed)

## 2023-10-30 NOTE — Progress Notes (Signed)
 Glucometer's are not transferring data over to the chart. Completed two capillary glucose checks on Melanie Spencer. First was at 1720, sugar was 290. Second reading was at 1740, sugar was then 278. MD notified. New orders placed for coverage.

## 2023-10-30 NOTE — Progress Notes (Signed)
   10/30/23 1723  TOC Brief Assessment  Insurance and Status Reviewed  Patient has primary care physician Yes  Home environment has been reviewed Single family home  Prior level of function: Needs assistance with ambulation  Prior/Current Home Services No current home services  Readmission risk has been reviewed Yes  Transition of care needs transition of care needs identified, TOC will continue to follow   Admitted No active principal problem. TOC will continue to monitor patient advancement through interdisciplinary progression rounds.

## 2023-10-30 NOTE — Progress Notes (Signed)
   10/30/23 1723  TOC Brief Assessment  Insurance and Status Reviewed  Patient has primary care physician Yes  Home environment has been reviewed Single family home  Prior level of function: Needs assistance with ambulation  Prior/Current Home Services No current home services  Readmission risk has been reviewed Yes  Transition of care needs transition of care needs identified, TOC will continue to follow

## 2023-10-31 DIAGNOSIS — N3001 Acute cystitis with hematuria: Secondary | ICD-10-CM | POA: Diagnosis not present

## 2023-10-31 DIAGNOSIS — A419 Sepsis, unspecified organism: Secondary | ICD-10-CM | POA: Diagnosis not present

## 2023-10-31 LAB — BASIC METABOLIC PANEL WITH GFR
Anion gap: 13 (ref 5–15)
BUN: 57 mg/dL — ABNORMAL HIGH (ref 8–23)
CO2: 21 mmol/L — ABNORMAL LOW (ref 22–32)
Calcium: 7.8 mg/dL — ABNORMAL LOW (ref 8.9–10.3)
Chloride: 102 mmol/L (ref 98–111)
Creatinine, Ser: 3.05 mg/dL — ABNORMAL HIGH (ref 0.44–1.00)
GFR, Estimated: 17 mL/min — ABNORMAL LOW (ref >60)
Glucose, Bld: 315 mg/dL — ABNORMAL HIGH (ref 70–99)
Potassium: 4.2 mmol/L (ref 3.5–5.1)
Sodium: 136 mmol/L (ref 135–145)

## 2023-10-31 LAB — CBC
HCT: 30 % — ABNORMAL LOW (ref 36.0–46.0)
Hemoglobin: 9.4 g/dL — ABNORMAL LOW (ref 12.0–15.0)
MCH: 26.2 pg (ref 26.0–34.0)
MCHC: 31.3 g/dL (ref 30.0–36.0)
MCV: 83.6 fL (ref 80.0–100.0)
Platelets: 103 10*3/uL — ABNORMAL LOW (ref 150–400)
RBC: 3.59 MIL/uL — ABNORMAL LOW (ref 3.87–5.11)
RDW: 15.5 % (ref 11.5–15.5)
WBC: 25.6 10*3/uL — ABNORMAL HIGH (ref 4.0–10.5)
nRBC: 0 % (ref 0.0–0.2)

## 2023-10-31 LAB — GLUCOSE, CAPILLARY
Glucose-Capillary: 254 mg/dL — ABNORMAL HIGH (ref 70–99)
Glucose-Capillary: 271 mg/dL — ABNORMAL HIGH (ref 70–99)
Glucose-Capillary: 236 mg/dL — ABNORMAL HIGH (ref 70–99)
Glucose-Capillary: 178 mg/dL — ABNORMAL HIGH (ref 70–99)

## 2023-10-31 MED ORDER — INSULIN ASPART 100 UNIT/ML IJ SOLN
3.0000 [IU] | Freq: Three times a day (TID) | INTRAMUSCULAR | Status: DC
  Administered 2023-10-31 – 2023-11-01 (×3): 3 [IU] via SUBCUTANEOUS

## 2023-10-31 MED ORDER — FUROSEMIDE 10 MG/ML IJ SOLN
40.0000 mg | Freq: Once | INTRAMUSCULAR | Status: AC
  Administered 2023-10-31: 40 mg via INTRAVENOUS
  Filled 2023-10-31: qty 4

## 2023-10-31 MED ORDER — METOPROLOL TARTRATE 25 MG PO TABS
12.5000 mg | ORAL_TABLET | Freq: Two times a day (BID) | ORAL | Status: DC
  Administered 2023-10-31 – 2023-11-01 (×2): 12.5 mg via ORAL
  Filled 2023-10-31 (×2): qty 1

## 2023-10-31 MED ORDER — INSULIN GLARGINE-YFGN 100 UNIT/ML ~~LOC~~ SOLN
17.0000 [IU] | Freq: Every day | SUBCUTANEOUS | Status: DC
Start: 2023-11-01 — End: 2023-11-01
  Administered 2023-11-01: 17 [IU] via SUBCUTANEOUS
  Filled 2023-10-31 (×2): qty 0.17

## 2023-10-31 NOTE — Inpatient Diabetes Management (Signed)
 Inpatient Diabetes Program Recommendations  AACE/ADA: New Consensus Statement on Inpatient Glycemic Control   Target Ranges:  Prepandial:   less than 140 mg/dL      Peak postprandial:   less than 180 mg/dL (1-2 hours)      Critically ill patients:  140 - 180 mg/dL    Latest Reference Range & Units 10/31/23 04:06  Glucose 70 - 99 mg/dL 914 (H)    Latest Reference Range & Units 10/29/23 03:07 10/29/23 15:46 10/30/23 17:39 10/30/23 20:55  Glucose-Capillary 70 - 99 mg/dL 782 (H) 956 (H) 213 (H) 366 (H)    Latest Reference Range & Units 10/29/23 13:41  Hemoglobin A1C 4.8 - 5.6 % 8.5 (H)   Review of Glycemic Control  Diabetes history: DM2 Outpatient Diabetes medications: Lantus 30-45 units at bedtime, Metformin 1000 mg BID, Trulicity 4.5 mg Qweek (Sunday) Current orders for Inpatient glycemic control: Semglee 10 units daily, Novolog 0-9 units TID with meals, Novolog 0-5 units QHS  Inpatient Diabetes Program Recommendations:    Insulin: Lab glucose 315 mg/dl this morning. Please consider increasing Semglee to 17 units daily (based on 113.4 kg x 0.15 units) and adding Novolog 3 units TID with meals for meal coverage if patient eats at least 50% of meals.  Thanks, Orlando Penner, RN, MSN, CDCES Diabetes Coordinator Inpatient Diabetes Program [phone] (Team Pager from 8am to 5pm)

## 2023-10-31 NOTE — Plan of Care (Signed)

## 2023-10-31 NOTE — Progress Notes (Signed)
 Patient's temperature currently 100.4. Cooling blanket turned back on with a temperature set of 98.7. Turned patient's oxygen down to 2L with a saturation of 94%.

## 2023-10-31 NOTE — Plan of Care (Signed)
  Problem: Elimination: Goal: Will not experience complications related to bowel motility Outcome: Progressing   Problem: Clinical Measurements: Goal: Respiratory complications will improve Outcome: Not Progressing   Problem: Activity: Goal: Risk for activity intolerance will decrease Outcome: Not Progressing   Problem: Nutrition: Goal: Adequate nutrition will be maintained Outcome: Not Progressing   Problem: Nutritional: Goal: Maintenance of adequate nutrition will improve Outcome: Not Progressing

## 2023-10-31 NOTE — Plan of Care (Signed)
  Problem: Education: Goal: Knowledge of General Education information will improve Description: Including pain rating scale, medication(s)/side effects and non-pharmacologic comfort measures Outcome: Not Progressing   Problem: Health Behavior/Discharge Planning: Goal: Ability to manage health-related needs will improve Outcome: Not Progressing   Problem: Clinical Measurements: Goal: Ability to maintain clinical measurements within normal limits will improve Outcome: Progressing Goal: Will remain free from infection Outcome: Progressing Goal: Diagnostic test results will improve Outcome: Progressing Goal: Respiratory complications will improve Outcome: Progressing Goal: Cardiovascular complication will be avoided Outcome: Progressing   Problem: Activity: Goal: Risk for activity intolerance will decrease Outcome: Not Progressing   Problem: Nutrition: Goal: Adequate nutrition will be maintained Outcome: Progressing   Problem: Coping: Goal: Level of anxiety will decrease Outcome: Progressing   Problem: Elimination: Goal: Will not experience complications related to bowel motility Outcome: Progressing Goal: Will not experience complications related to urinary retention Outcome: Progressing   Problem: Pain Managment: Goal: General experience of comfort will improve and/or be controlled Outcome: Progressing   Problem: Safety: Goal: Ability to remain free from injury will improve Outcome: Progressing   Problem: Skin Integrity: Goal: Risk for impaired skin integrity will decrease Outcome: Progressing   Problem: Education: Goal: Ability to describe self-care measures that may prevent or decrease complications (Diabetes Survival Skills Education) will improve Outcome: Not Progressing Goal: Individualized Educational Video(s) Outcome: Not Progressing   Problem: Coping: Goal: Ability to adjust to condition or change in health will improve Outcome: Progressing    Problem: Fluid Volume: Goal: Ability to maintain a balanced intake and output will improve Outcome: Progressing   Problem: Health Behavior/Discharge Planning: Goal: Ability to identify and utilize available resources and services will improve Outcome: Progressing Goal: Ability to manage health-related needs will improve Outcome: Progressing   Problem: Metabolic: Goal: Ability to maintain appropriate glucose levels will improve Outcome: Progressing   Problem: Nutritional: Goal: Maintenance of adequate nutrition will improve Outcome: Progressing Goal: Progress toward achieving an optimal weight will improve Outcome: Progressing   Problem: Skin Integrity: Goal: Risk for impaired skin integrity will decrease Outcome: Progressing   Problem: Tissue Perfusion: Goal: Adequacy of tissue perfusion will improve Outcome: Progressing

## 2023-10-31 NOTE — Progress Notes (Signed)
 Patient complaining of being cold with the cooling blanket on. Patient's current temperature is 98.8 rectal. Turning cooling blanket off per patient and husband request.

## 2023-10-31 NOTE — Progress Notes (Signed)
 PROGRESS NOTE  Melanie Spencer VOZ:[id number] DOB: 02/14/1961 DOA: 10/29/2023 PCP: No primary care provider on file.  Brief History:  63 year old female with a history of hypertension, hyperlipidemia, diabetes mellitus type 2, stroke with residual left hemiparesis presenting with generalized weakness and confusion that began on 10/25/2023.  The patient has underlying cognitive impairment.  History is obtained from speaking with the patient and spouse at the bedside.  At baseline, the patient is able to ambulate with a cane and standby assist.  However spouse has noted that the patient has had increasing weakness as the week has progressed.  On the evening of 10/28/2023, the patient was not able to get out of bed because of worsening weakness.  Spouse has also endorsed increasing somnolence.  At baseline, the patient speaks fluently, but is pleasantly confused and A&O x 1.  Spouse states that she has been started on Lyrica about a week prior to this admission.  She was previously taking gabapentin.  There is no other new medications. There is been no complaints of chest pain, shortness breath, coughing, hemoptysis, vomiting, diarrhea, abdominal pain.  Oral intake has been decreased this week.  Patient has approximately 20-pack-year history of tobacco after quitting smoking in 2007. In the ED, the patient was afebrile and hemodynamically stable with oxygen saturation 94% room air.  WBC 10.9, hemoglobin 10.1, platelets 233.  Sodium 138, potassium 4.2, bicarbonate 20, serum creatinine 1.59.  Lactic acid 3.7.  UA >50 WBC, >50 RBC.  The patient was started on cefepime.  She was admitted for further evaluation and treatment.  She became hypotension and she was started on levophed.  Her antibiotic coverage was broadened with zyvox and merrem.  She was weaned off of levophed.   Assessment/Plan: Severe sepsis with septic shock -Presented with tachycardia, tachypnea, confusion -Lactic acid  3.7>>5.2>>2.6 -Patient required Levophed on 10/29/2023>> weaned off and on 10/30/23 -Continue IV fluids -Broadened antibiotic coverage to include Merrem and linezolid -Follow blood and urine cultures--neg to date -Personally reviewed chest x-ray--increased interstitial markings -sepsis physiology has improved   UTI -UA>50 WBC -Unfortunately, initial urine cultures were not sent at time of admission   Acute respiratory failure with hypoxia -Secondary to fluid overload -Stable on 2 L -10/31/2023 IV furosemide x 1   Acute metabolic encephalopathy -Secondary to UTI and sepsis -Check VBG--7.32/39/42/20 -B12--169>>> replete -Holding Lyrica temporarily -d/c oxycodone -now having hospital delirium   Uncontrolled diabetes mellitus type 2 with hyperglycemia -NovoLog sliding scale -07/14/2023 hemoglobin A1c 9.1 -Start reduced dose Semglee>>increase to 17 units -add novolog 3 units with meals -Hold metformin and pioglitazone   Acute on chronic CKD 3a -baseline creatinine 1.3-1.4 -Secondary to sepsis and hypotension -Serum creatinine peaked 2.58>>3.05 -Renal ultrasound--No significant sonographic abnormality of the kidneys.   Essential hypertension -Holding ramipril secondary to AKI -start amlodipine   Mixed hyperlipidemia -Continue statin   Morbid obesity -BMI 41.60 -Lifestyle modification   History of stroke -Continue aspirin                   Family Communication:   Spouse at bedside 10/31/2023   Consultants:  none   Code Status:  FULL    DVT Prophylaxis:   [place] Lovenox     Procedures: As Listed in Progress Note Above   Antibiotics: Meropenem 10/29/23>> Linezolid 10/29/23>>4/7            Subjective: Pt is pleasantly confused.  Denies f/c, cp, sob, abd pain  Objective: Vitals:   10/31/23 1000 10/31/23 1100 10/31/23 1200 10/31/23 1208  BP: (!) 146/70 (!) 158/81 (!) 153/78   Pulse: 93 90 96   Resp:  (!) 25 (!) 28   Temp:    98.5 F (36.9 C)   TempSrc:    Axillary  SpO2: 92% 97% 92%   Weight:      Height:        Intake/Output Summary (Last 24 hours) at 10/31/2023 1237 Last data filed at 10/31/2023 2956 Gross per 24 hour  Intake 1000 ml  Output 350 ml  Net 650 ml   Weight change:  Exam:  General:  Pt is alert, follows commands appropriately, not in acute distress HEENT: No icterus, No thrush, No neck mass, [place]/AT Cardiovascular: RRR, S1/S2, no rubs, no gallops Respiratory: bibasilar crackles. No wheeze Abdomen: Soft/+BS, non tender, non distended, no guarding Extremities: 1 + LE edema, No lymphangitis, No petechiae, No rashes, no synovitis   Data Reviewed: I have personally reviewed following labs and imaging studies Basic Metabolic Panel: Recent Labs  Lab 10/29/23 0300 10/29/23 1341 10/30/23 0513 10/31/23 0406  NA 138 138 136 136  K 4.2 3.5 4.9 4.2  CL 107 107 103 102  CO2 20* 18* 18* 21*  GLUCOSE 231* 163* 304* 315*  BUN 31* 37* 46* 57*  CREATININE 1.59* 1.99* 2.58* 3.05*  CALCIUM 8.6* 8.3* 7.9* 7.8*   Liver Function Tests: Recent Labs  Lab 10/29/23 1341  AST 42*  ALT 27  ALKPHOS 111  BILITOT 0.7  PROT 5.6*  ALBUMIN 2.5*   No results for input(s): "LIPASE", "AMYLASE" in the last 168 hours. No results for input(s): "AMMONIA" in the last 168 hours. Coagulation Profile: No results for input(s): "INR", "PROTIME" in the last 168 hours. CBC: Recent Labs  Lab 10/29/23 0300 10/29/23 1341 10/30/23 0513 10/31/23 0406  WBC 10.9* 12.2* 27.8* 25.6*  HGB 10.1* 9.3* 10.1* 9.4*  HCT 33.6* 29.5* 33.3* 30.0*  MCV 84.8 82.6 84.5 83.6  PLT 233 152 140* 103*   Cardiac Enzymes: No results for input(s): "CKTOTAL", "CKMB", "CKMBINDEX", "TROPONINI" in the last 168 hours. BNP: Invalid input(s): "POCBNP" CBG: Recent Labs  Lab 10/29/23 1546 10/30/23 1739 10/30/23 2055 10/31/23 0800 10/31/23 1200  GLUCAP 143* 278* 366* 254* 271*   HbA1C: Recent Labs    10/29/23 0833 10/29/23 1341  HGBA1C 8.5* 8.5*    Urine analysis:    Component Value Date/Time   COLORURINE YELLOW 10/29/2023 0300   APPEARANCEUR CLOUDY (A) 10/29/2023 0300   APPEARANCEUR Cloudy (A) 11/25/2022 1655   LABSPEC 1.021 10/29/2023 0300   PHURINE 5.0 10/29/2023 0300   GLUCOSEU >=500 (A) 10/29/2023 0300   HGBUR MODERATE (A) 10/29/2023 0300   BILIRUBINUR NEGATIVE 10/29/2023 0300   BILIRUBINUR Negative 11/25/2022 1655   KETONESUR NEGATIVE 10/29/2023 0300   PROTEINUR 100 (A) 10/29/2023 0300   NITRITE POSITIVE (A) 10/29/2023 0300   LEUKOCYTESUR LARGE (A) 10/29/2023 0300   Sepsis Labs: @LABRCNTIP (procalcitonin:4,lacticidven:4) ) Recent Results (from the past 240 hours)  Blood culture (routine x 2)     Status: None (Preliminary result)   Collection Time: 10/29/23  4:34 AM   Specimen: Left Antecubital; Blood  Result Value Ref Range Status   Specimen Description LEFT ANTECUBITAL  Final   Special Requests   Final    BOTTLES DRAWN AEROBIC AND ANAEROBIC Blood Culture adequate volume   Culture   Final    NO GROWTH 2 DAYS Performed at [facility], [address], [place], [place] [address]  Report Status PENDING  Incomplete  Blood culture (routine x 2)     Status: None (Preliminary result)   Collection Time: 10/29/23  4:56 AM   Specimen: BLOOD RIGHT HAND  Result Value Ref Range Status   Specimen Description BLOOD RIGHT HAND  Final   Special Requests   Final    BOTTLES DRAWN AEROBIC ONLY Blood Culture adequate volume   Culture   Final    NO GROWTH 2 DAYS Performed at [facility], [address], [place], [place] [address]    Report Status PENDING  Incomplete  Resp panel by RT-PCR (RSV, Flu A&B, Covid) Anterior Nasal Swab     Status: None   Collection Time: 10/29/23  5:58 AM   Specimen: Anterior Nasal Swab  Result Value Ref Range Status   SARS Coronavirus 2 by RT PCR NEGATIVE NEGATIVE Final    Comment: (NOTE) SARS-CoV-2 target nucleic acids are NOT DETECTED.  The SARS-CoV-2 RNA is generally detectable in upper  respiratory specimens during the acute phase of infection. The lowest concentration of SARS-CoV-2 viral copies this assay can detect is 138 copies/mL. A negative result does not preclude SARS-Cov-2 infection and should not be used as the sole basis for treatment or other patient management decisions. A negative result may occur with  improper specimen collection/handling, submission of specimen other than nasopharyngeal swab, presence of viral mutation(s) within the areas targeted by this assay, and inadequate number of viral copies(<138 copies/mL). A negative result must be combined with clinical observations, patient history, and epidemiological information. The expected result is Negative.  Fact Sheet for Patients:  BloggerCourse.com  Fact Sheet for Healthcare Providers:  SeriousBroker.it  This test is no t yet approved or cleared by the [place] FDA and  has been authorized for detection and/or diagnosis of SARS-CoV-2 by FDA under an Emergency Use Authorization (EUA). This EUA will remain  in effect (meaning this test can be used) for the duration of the COVID-19 declaration under Section 564(b)(1) of the Act, 21 U.S.C.section 360bbb-3(b)(1), unless the authorization is terminated  or revoked sooner.       Influenza A by PCR NEGATIVE NEGATIVE Final   Influenza B by PCR NEGATIVE NEGATIVE Final    Comment: (NOTE) The Xpert Xpress SARS-CoV-2/FLU/RSV plus assay is intended as an aid in the diagnosis of influenza from Nasopharyngeal swab specimens and should not be used as a sole basis for treatment. Nasal washings and aspirates are unacceptable for Xpert Xpress SARS-CoV-2/FLU/RSV testing.  Fact Sheet for Patients: BloggerCourse.com  Fact Sheet for Healthcare Providers: SeriousBroker.it  This test is not yet approved or cleared by the [place] FDA and has been  authorized for detection and/or diagnosis of SARS-CoV-2 by FDA under an Emergency Use Authorization (EUA). This EUA will remain in effect (meaning this test can be used) for the duration of the COVID-19 declaration under Section 564(b)(1) of the Act, 21 U.S.C. section 360bbb-3(b)(1), unless the authorization is terminated or revoked.     Resp Syncytial Virus by PCR NEGATIVE NEGATIVE Final    Comment: (NOTE) Fact Sheet for Patients: BloggerCourse.com  Fact Sheet for Healthcare Providers: SeriousBroker.it  This test is not yet approved or cleared by the [place] FDA and has been authorized for detection and/or diagnosis of SARS-CoV-2 by FDA under an Emergency Use Authorization (EUA). This EUA will remain in effect (meaning this test can be used) for the duration of the COVID-19 declaration under Section 564(b)(1) of the Act, 21 U.S.C. section 360bbb-3(b)(1), unless the authorization  is terminated or revoked.  Performed at [facility], [address] [place], [place] [address]   Urine Culture     Status: None   Collection Time: 10/29/23  1:15 PM   Specimen: Urine, Clean Catch  Result Value Ref Range Status   Specimen Description   Final    URINE, CLEAN CATCH Performed at [facility], [address] [place], [place] [address]    Special Requests   Final    NONE Performed at [facility], [address] [place], [place] [address]    Culture   Final    NO GROWTH Performed at [facility], [address], [place], [place] [address]    Report Status 10/30/2023 FINAL  Final  MRSA Next Gen by PCR, Nasal     Status: None   Collection Time: 10/29/23  1:21 PM   Specimen: Nasal Mucosa; Nasal Swab  Result Value Ref Range Status   MRSA by PCR Next Gen NOT DETECTED NOT DETECTED Final    Comment: (NOTE) The GeneXpert MRSA Assay (FDA approved for NASAL specimens only), is one component of a comprehensive MRSA colonization  surveillance program. It is not intended to diagnose MRSA infection nor to guide or monitor treatment for MRSA infections. Test performance is not FDA approved in patients less than 92 years old. Performed at [facility], [address], [place], [place] [address]   C Difficile Quick Screen w PCR reflex     Status: None   Collection Time: 10/30/23 12:11 PM   Specimen: STOOL  Result Value Ref Range Status   C Diff antigen NEGATIVE NEGATIVE Final   C Diff toxin NEGATIVE NEGATIVE Final   C Diff interpretation No C. difficile detected.  Final    Comment: Performed at [facility], [address], [place], [place] [address]     Scheduled Meds:  amLODipine  5 mg Oral Daily   aspirin  325 mg Oral Daily   Chlorhexidine Gluconate Cloth  6 each Topical Q0600   enoxaparin (LOVENOX) injection  30 mg Subcutaneous Q24H   insulin aspart  0-5 Units Subcutaneous QHS   insulin aspart  0-9 Units Subcutaneous TID WC   insulin aspart  3 Units Subcutaneous TID WC   [START ON 11/01/2023] insulin glargine-yfgn  17 Units Subcutaneous Daily   pantoprazole  40 mg Oral Daily   simvastatin  40 mg Oral QHS   vitamin B-12  500 mcg Oral Daily   Continuous Infusions:  linezolid (ZYVOX) IV 600 mg (10/31/23 1016)   meropenem (MERREM) IV 1 g (10/31/23 1018)   norepinephrine (LEVOPHED) Adult infusion Stopped (10/30/23 0032)    Procedures/Studies: US RENAL Result Date: 10/30/2023 CLINICAL DATA:  Acute kidney injury EXAM: RENAL / URINARY TRACT ULTRASOUND COMPLETE COMPARISON:  Renal stone protocol CT 08/04/2022 FINDINGS: Right Kidney: Renal measurements: 12.0 x 5.8 x 4.9 cm = volume: 120 mL. Echogenicity within normal limits. No mass or hydronephrosis visualized. Trace right perinephric fluid, of doubtful clinical significance. Left Kidney: Renal measurements: 12.8 x 5.8 x 5.4 cm = volume: 209 mL. Echogenicity within normal limits. No mass or hydronephrosis visualized. Bladder: Unable to evaluate due to collapse  configuration. Other: None. IMPRESSION: No significant sonographic abnormality of the kidneys. Electronically Signed   By: Acquanetta Belling M.D.   On: 10/30/2023 18:38   DG CHEST PORT 1 VIEW Result Date: 10/30/2023 CLINICAL DATA:  Shortness of breath.  Weakness. EXAM: PORTABLE CHEST 1 VIEW COMPARISON:  Chest pain and dated 10/29/2023. FINDINGS: There  is mild cardiomegaly and mild vascular congestion. No focal consolidation, pleural effusion or pneumothorax. Acute osseous pathology. IMPRESSION: Mild cardiomegaly and mild vascular congestion. Electronically Signed   By: Elgie Collard M.D.   On: 10/30/2023 16:24   DG Chest Port 1 View Result Date: 10/29/2023 CLINICAL DATA:  Shortness of breath EXAM: PORTABLE CHEST 1 VIEW COMPARISON:  None Available. FINDINGS: Mild cardiomegaly with mild central vascular congestion. No focal consolidation, pleural effusion, pneumothorax. Osseous pathology. IMPRESSION: Mild cardiomegaly with mild central vascular congestion. Electronically Signed   By: Elgie Collard M.D.   On: 10/29/2023 13:54    Catarina Hartshorn, DO  [facility]  If 7PM-7AM, please contact night-coverage [email or web address] Password [facility] 10/31/2023, 12:37 PM   LOS: 2 days

## 2023-11-01 DIAGNOSIS — G9341 Metabolic encephalopathy: Secondary | ICD-10-CM | POA: Diagnosis not present

## 2023-11-01 DIAGNOSIS — N3000 Acute cystitis without hematuria: Secondary | ICD-10-CM

## 2023-11-01 DIAGNOSIS — N1831 Chronic kidney disease, stage 3a: Secondary | ICD-10-CM | POA: Diagnosis not present

## 2023-11-01 DIAGNOSIS — A419 Sepsis, unspecified organism: Secondary | ICD-10-CM | POA: Diagnosis not present

## 2023-11-01 DIAGNOSIS — R531 Weakness: Secondary | ICD-10-CM | POA: Diagnosis not present

## 2023-11-01 LAB — COMPREHENSIVE METABOLIC PANEL WITH GFR
ALT: 27 U/L (ref 0–44)
AST: 14 U/L — ABNORMAL LOW (ref 15–41)
Albumin: 2 g/dL — ABNORMAL LOW (ref 3.5–5.0)
Alkaline Phosphatase: 179 U/L — ABNORMAL HIGH (ref 38–126)
Anion gap: 15 (ref 5–15)
BUN: 59 mg/dL — ABNORMAL HIGH (ref 8–23)
CO2: 20 mmol/L — ABNORMAL LOW (ref 22–32)
Calcium: 8.1 mg/dL — ABNORMAL LOW (ref 8.9–10.3)
Chloride: 102 mmol/L (ref 98–111)
Creatinine, Ser: 3.22 mg/dL — ABNORMAL HIGH (ref 0.44–1.00)
GFR, Estimated: 16 mL/min — ABNORMAL LOW (ref >60)
Glucose, Bld: 319 mg/dL — ABNORMAL HIGH (ref 70–99)
Potassium: 3.7 mmol/L (ref 3.5–5.1)
Sodium: 137 mmol/L (ref 135–145)
Total Bilirubin: 0.6 mg/dL (ref 0.0–1.2)
Total Protein: 6.3 g/dL — ABNORMAL LOW (ref 6.5–8.1)

## 2023-11-01 LAB — CBC WITH DIFFERENTIAL/PLATELET
Abs Immature Granulocytes: 0 10*3/uL (ref 0.00–0.07)
Basophils Absolute: 0 10*3/uL (ref 0.0–0.1)
Basophils Relative: 0 %
Eosinophils Absolute: 0 10*3/uL (ref 0.0–0.5)
Eosinophils Relative: 0 %
HCT: 30.5 % — ABNORMAL LOW (ref 36.0–46.0)
Hemoglobin: 9.3 g/dL — ABNORMAL LOW (ref 12.0–15.0)
Lymphocytes Relative: 10 %
Lymphs Abs: 1.4 10*3/uL (ref 0.7–4.0)
MCH: 25.1 pg — ABNORMAL LOW (ref 26.0–34.0)
MCHC: 30.5 g/dL (ref 30.0–36.0)
MCV: 82.2 fL (ref 80.0–100.0)
Monocytes Absolute: 0.4 10*3/uL (ref 0.1–1.0)
Monocytes Relative: 3 %
Neutro Abs: 12.4 10*3/uL — ABNORMAL HIGH (ref 1.7–7.7)
Neutrophils Relative %: 87 %
Platelets: 101 10*3/uL — ABNORMAL LOW (ref 150–400)
RBC: 3.71 MIL/uL — ABNORMAL LOW (ref 3.87–5.11)
RDW: 15.5 % (ref 11.5–15.5)
WBC: 14.2 10*3/uL — ABNORMAL HIGH (ref 4.0–10.5)
nRBC: 0 % (ref 0.0–0.2)

## 2023-11-01 LAB — GLUCOSE, CAPILLARY
Glucose-Capillary: 245 mg/dL — ABNORMAL HIGH (ref 70–99)
Glucose-Capillary: 274 mg/dL — ABNORMAL HIGH (ref 70–99)
Glucose-Capillary: 227 mg/dL — ABNORMAL HIGH (ref 70–99)
Glucose-Capillary: 220 mg/dL — ABNORMAL HIGH (ref 70–99)

## 2023-11-01 MED ORDER — METOPROLOL TARTRATE 50 MG PO TABS
50.0000 mg | ORAL_TABLET | Freq: Two times a day (BID) | ORAL | Status: DC
  Administered 2023-11-01 – 2023-11-02 (×3): 50 mg via ORAL
  Filled 2023-11-01 (×3): qty 1

## 2023-11-01 MED ORDER — ORAL CARE MOUTH RINSE
15.0000 mL | OROMUCOSAL | Status: DC
  Administered 2023-11-01 – 2023-11-08 (×26): 15 mL via OROMUCOSAL

## 2023-11-01 MED ORDER — LACTATED RINGERS IV SOLN: INTRAVENOUS | Status: DC

## 2023-11-01 MED ORDER — HYDRALAZINE HCL 20 MG/ML IJ SOLN: 10.0000 mg | INTRAMUSCULAR | Status: DC | PRN

## 2023-11-01 MED ORDER — SODIUM CHLORIDE 0.45 % IV SOLN
INTRAVENOUS | Status: AC
  Filled 2023-11-01: qty 75

## 2023-11-01 MED ORDER — INSULIN ASPART 100 UNIT/ML IJ SOLN
5.0000 [IU] | Freq: Three times a day (TID) | INTRAMUSCULAR | Status: DC
  Administered 2023-11-01 – 2023-11-05 (×14): 5 [IU] via SUBCUTANEOUS

## 2023-11-01 MED ORDER — ENSURE ENLIVE PO LIQD
237.0000 mL | Freq: Two times a day (BID) | ORAL | Status: DC
  Administered 2023-11-01: 237 mL via ORAL

## 2023-11-01 MED ORDER — INSULIN GLARGINE-YFGN 100 UNIT/ML ~~LOC~~ SOLN
25.0000 [IU] | Freq: Every day | SUBCUTANEOUS | Status: DC
  Administered 2023-11-02: 25 [IU] via SUBCUTANEOUS
  Filled 2023-11-01 (×2): qty 0.25

## 2023-11-01 MED ORDER — ORAL CARE MOUTH RINSE: 15.0000 mL | OROMUCOSAL | Status: DC | PRN

## 2023-11-01 NOTE — Therapy (Signed)
 Attempted to see pt for PT however pt temperature elevated and required warming blanket therefore discussed with RN hold until medically stable and able to perform bed mobility/evaluation.   Placido Sou PT, DPT [facility] [phone] office

## 2023-11-01 NOTE — Plan of Care (Signed)

## 2023-11-01 NOTE — Plan of Care (Signed)
  Problem: Education: Goal: Knowledge of General Education information will improve Description: Including pain rating scale, medication(s)/side effects and non-pharmacologic comfort measures Outcome: Not Progressing   Problem: Health Behavior/Discharge Planning: Goal: Ability to manage health-related needs will improve Outcome: Progressing   Problem: Clinical Measurements: Goal: Ability to maintain clinical measurements within normal limits will improve Outcome: Progressing Goal: Will remain free from infection Outcome: Progressing Goal: Diagnostic test results will improve Outcome: Progressing Goal: Respiratory complications will improve Outcome: Progressing Goal: Cardiovascular complication will be avoided Outcome: Progressing   Problem: Activity: Goal: Risk for activity intolerance will decrease Outcome: Not Progressing   Problem: Nutrition: Goal: Adequate nutrition will be maintained Outcome: Progressing   Problem: Coping: Goal: Level of anxiety will decrease Outcome: Progressing   Problem: Elimination: Goal: Will not experience complications related to bowel motility Outcome: Progressing Goal: Will not experience complications related to urinary retention Outcome: Progressing   Problem: Pain Managment: Goal: General experience of comfort will improve and/or be controlled Outcome: Progressing   Problem: Safety: Goal: Ability to remain free from injury will improve Outcome: Progressing   Problem: Skin Integrity: Goal: Risk for impaired skin integrity will decrease Outcome: Progressing   Problem: Education: Goal: Ability to describe self-care measures that may prevent or decrease complications (Diabetes Survival Skills Education) will improve Outcome: Not Progressing Goal: Individualized Educational Video(s) Outcome: Not Progressing   Problem: Coping: Goal: Ability to adjust to condition or change in health will improve Outcome: Progressing   Problem:  Fluid Volume: Goal: Ability to maintain a balanced intake and output will improve Outcome: Progressing   Problem: Health Behavior/Discharge Planning: Goal: Ability to identify and utilize available resources and services will improve Outcome: Progressing Goal: Ability to manage health-related needs will improve Outcome: Progressing   Problem: Metabolic: Goal: Ability to maintain appropriate glucose levels will improve Outcome: Progressing   Problem: Nutritional: Goal: Maintenance of adequate nutrition will improve Outcome: Progressing Goal: Progress toward achieving an optimal weight will improve Outcome: Progressing   Problem: Skin Integrity: Goal: Risk for impaired skin integrity will decrease Outcome: Progressing   Problem: Tissue Perfusion: Goal: Adequacy of tissue perfusion will improve Outcome: Progressing

## 2023-11-01 NOTE — Progress Notes (Signed)
 PROGRESS NOTE  Melanie Spencer ZOX:[id number] DOB: 09-30-1960 DOA: 10/29/2023 PCP: No primary care provider on file.     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  The patient has underlying cognitive impairment.  History is obtained from speaking with the patient and spouse at the bedside.  At baseline, the patient is able to ambulate with a cane and standby assist.  However spouse has noted that the patient has had increasing weakness as the week has progressed.  On the evening of 10/28/2023, the patient was not able to get out of bed because of worsening weakness.  Spouse has also endorsed increasing somnolence.  At baseline, the patient speaks fluently, but is pleasantly confused and A&O x 1.  Spouse states that she has been started on Lyrica about a week prior to this admission.  She was previously taking gabapentin.  There is no other new medications. There is been no complaints of chest pain, shortness breath, coughing, hemoptysis, vomiting, diarrhea, abdominal pain.  Oral intake has been decreased this week.  Patient has approximately 20-pack-year history of tobacco after quitting smoking in 2007. In the ED, the patient was afebrile and hemodynamically stable with oxygen saturation 94% room air.  WBC 10.9, hemoglobin 10.1, platelets 233.  Sodium 138, potassium 4.2, bicarbonate 20, serum creatinine 1.59.  Lactic acid 3.7.  UA >50 WBC, >50 RBC.  The patient was started on cefepime.  She was admitted for further evaluation and treatment.  She became hypotension and she was started on levophed.  Her antibiotic coverage was broadened with zyvox and  merrem.  She was weaned off of levophed.   Assessment/Plan:  Severe sepsis with septic shock-[place] source UTI -Improving sepsis physiology, Tmax 100.4, currently 99.7 RR 24 Improving leukocytosis,  -POA:  tachycardia, tachypnea, confusion -Lactic acid 3.7>>5.2>>2.6 -Patient required Levophed on 10/29/2023>> weaned off and on 10/30/23 -Continue IV fluids -Broadened antibiotic coverage to include Merrem and linezolid -Follow blood and urine cultures--neg to date -Chest x-ray--increased interstitial markings    UTI -UA>50 WBC -Unfortunately, initial urine cultures were not sent at time of admission   Acute respiratory failure with hypoxia -Secondary to fluid overload -Remained on 2 L of oxygen, satting 93% -10/31/2023 IV furosemide x 1   Acute metabolic encephalopathy Improved back to baseline -Secondary to UTI and sepsis -Check VBG--7.32/39/42/20 -B12--169>>> replete -Holding Lyrica temporarily -d/c oxycodone -now having hospital delirium   Uncontrolled diabetes mellitus type 2 with hyperglycemia -NovoLog sliding scale -07/14/2023 hemoglobin A1c 9.1 -Start reduced dose Semglee>>increase from 17 >>> 25 units -add novolog 3 units with meals -Hold metformin and pioglitazone CBG (last 3)  Recent Labs    10/31/23 1543 10/31/23 2111 11/01/23 0723  GLUCAP 236* 178* 245*      Acute on chronic CKD 3a -baseline creatinine 1.3-1.4 -Secondary to sepsis and hypotension -Serum creatinine peaked 2.58>>3.05 >>3.22  With BUN of 59, GFR 16 -Renal ultrasound--No significant sonographic abnormality of the kidneys. Consulting nephrology for evaluation   Essential hypertension -Holding ramipril secondary to AKI -start amlodipine   Mixed hyperlipidemia -Continue statin   Morbid obesity -BMI 41.60 -Lifestyle modification   History of stroke -Continue aspirin -With right-sided weakness  Family Communication:   Spouse at bedside 10/31/2023   Consultants:   none   Code Status:  FULL    DVT Prophylaxis:   [place] Lovenox     Procedures: As Listed in Progress Note Above   Antibiotics: Meropenem 10/29/23>> Linezolid 10/29/23>>4/7           Objective: Vitals:   11/01/23 0600 11/01/23 0803 11/01/23 0900 11/01/23 1000  BP: 117/70 (!) 150/69 (!) 129/46 (!) 140/71  Pulse: 76 88 91 78  Resp: 16 (!) 21 (!) 25 (!) 24  Temp:    99.7 F (37.6 C)  TempSrc:    Rectal  SpO2: 94% 92% 93% 93%  Weight:      Height:        Intake/Output Summary (Last 24 hours) at 11/01/2023 1028 Last data filed at 11/01/2023 0406 Gross per 24 hour  Intake 800.25 ml  Output 1550 ml  Net -749.75 ml   Weight change:     General:  AAO x 3,  cooperative, no distress;   HEENT:  Normocephalic, PERRL, otherwise with in Normal limits   Neuro:  Right-sided weakness, right arm paralysis CNII-XII intact. , normal motor and sensation, reflexes intact   Lungs:   Clear to auscultation BL, Respirations unlabored,  No wheezes / crackles  Cardio:    S1/S2, RRR, No murmure, No Rubs or Gallops   Abdomen:  Soft, non-tender, bowel sounds active all four quadrants, no guarding or peritoneal signs.  Muscular  skeletal:  Sided weakness Limited exam -global generalized weaknesses - in bed, able to move all other 3 extremities,   2+ pulses,  symmetric, No pitting edema  Skin:  Dry, warm to touch, negative for any Rashes,  Wounds: Please see nursing documentation          Data Reviewed: I have personally reviewed following labs and imaging studies Basic Metabolic Panel: Recent Labs  Lab 10/29/23 0300 10/29/23 1341 10/30/23 0513 10/31/23 0406 11/01/23 0912  NA 138 138 136 136 137  K 4.2 3.5 4.9 4.2 3.7  CL 107 107 103 102 102  CO2 20* 18* 18* 21* 20*  GLUCOSE 231* 163* 304* 315* 319*  BUN 31* 37* 46* 57* 59*  CREATININE 1.59* 1.99* 2.58* 3.05* 3.22*  CALCIUM 8.6* 8.3* 7.9* 7.8* 8.1*   Liver Function Tests: Recent Labs  Lab 10/29/23 1341 11/01/23 0912  AST  42* 14*  ALT 27 27  ALKPHOS 111 179*  BILITOT 0.7 0.6  PROT 5.6* 6.3*  ALBUMIN 2.5* 2.0*   No results for input(s): "LIPASE", "AMYLASE" in the last 168 hours. No results for input(s): "AMMONIA" in the last 168 hours. Coagulation Profile: No results for input(s): "INR", "PROTIME" in the last 168 hours. CBC: Recent Labs  Lab 10/29/23 0300 10/29/23 1341 10/30/23 0513 10/31/23 0406 11/01/23 0912  WBC 10.9* 12.2* 27.8* 25.6* 14.2*  NEUTROABS  --   --   --   --  12.4*  HGB 10.1* 9.3* 10.1* 9.4* 9.3*  HCT 33.6* 29.5* 33.3* 30.0* 30.5*  MCV 84.8 82.6 84.5 83.6 82.2  PLT 233 152 140* 103* 101*   Cardiac Enzymes: No results for input(s): "CKTOTAL", "CKMB", "CKMBINDEX", "TROPONINI" in the last 168 hours. BNP: Invalid input(s): "POCBNP" CBG: Recent Labs  Lab 10/31/23 0800 10/31/23 1200 10/31/23 1543 10/31/23 2111 11/01/23 0723  GLUCAP 254* 271* 236* 178* 245*   HbA1C: Recent Labs    10/29/23 1341  HGBA1C 8.5*   Urine analysis:    Component Value Date/Time   COLORURINE  YELLOW 10/29/2023 0300   APPEARANCEUR CLOUDY (A) 10/29/2023 0300   APPEARANCEUR Cloudy (A) 11/25/2022 1655   LABSPEC 1.021 10/29/2023 0300   PHURINE 5.0 10/29/2023 0300   GLUCOSEU >=500 (A) 10/29/2023 0300   HGBUR MODERATE (A) 10/29/2023 0300   BILIRUBINUR NEGATIVE 10/29/2023 0300   BILIRUBINUR Negative 11/25/2022 1655   KETONESUR NEGATIVE 10/29/2023 0300   PROTEINUR 100 (A) 10/29/2023 0300   NITRITE POSITIVE (A) 10/29/2023 0300   LEUKOCYTESUR LARGE (A) 10/29/2023 0300   Sepsis Labs: @LABRCNTIP (procalcitonin:4,lacticidven:4) ) Recent Results (from the past 240 hours)  Blood culture (routine x 2)     Status: None (Preliminary result)   Collection Time: 10/29/23  4:34 AM   Specimen: Left Antecubital; Blood  Result Value Ref Range Status   Specimen Description LEFT ANTECUBITAL  Final   Special Requests   Final    BOTTLES DRAWN AEROBIC AND ANAEROBIC Blood Culture adequate volume   Culture    Final    NO GROWTH 3 DAYS Performed at [facility], [address], [place], [place] [address]    Report Status PENDING  Incomplete  Blood culture (routine x 2)     Status: None (Preliminary result)   Collection Time: 10/29/23  4:56 AM   Specimen: BLOOD RIGHT HAND  Result Value Ref Range Status   Specimen Description BLOOD RIGHT HAND  Final   Special Requests   Final    BOTTLES DRAWN AEROBIC ONLY Blood Culture adequate volume   Culture   Final    NO GROWTH 3 DAYS Performed at [facility], [address], [place], [place] [address]    Report Status PENDING  Incomplete  Resp panel by RT-PCR (RSV, Flu A&B, Covid) Anterior Nasal Swab     Status: None   Collection Time: 10/29/23  5:58 AM   Specimen: Anterior Nasal Swab  Result Value Ref Range Status   SARS Coronavirus 2 by RT PCR NEGATIVE NEGATIVE Final    Comment: (NOTE) SARS-CoV-2 target nucleic acids are NOT DETECTED.  The SARS-CoV-2 RNA is generally detectable in upper respiratory specimens during the acute phase of infection. The lowest concentration of SARS-CoV-2 viral copies this assay can detect is 138 copies/mL. A negative result does not preclude SARS-Cov-2 infection and should not be used as the sole basis for treatment or other patient management decisions. A negative result may occur with  improper specimen collection/handling, submission of specimen other than nasopharyngeal swab, presence of viral mutation(s) within the areas targeted by this assay, and inadequate number of viral copies(<138 copies/mL). A negative result must be combined with clinical observations, patient history, and epidemiological information. The expected result is Negative.  Fact Sheet for Patients:  BloggerCourse.com  Fact Sheet for Healthcare Providers:  SeriousBroker.it  This test is no t yet approved or cleared by the [place] FDA and  has been authorized for detection and/or  diagnosis of SARS-CoV-2 by FDA under an Emergency Use Authorization (EUA). This EUA will remain  in effect (meaning this test can be used) for the duration of the COVID-19 declaration under Section 564(b)(1) of the Act, 21 U.S.C.section 360bbb-3(b)(1), unless the authorization is terminated  or revoked sooner.       Influenza A by PCR NEGATIVE NEGATIVE Final   Influenza B by PCR NEGATIVE NEGATIVE Final    Comment: (NOTE) The Xpert Xpress SARS-CoV-2/FLU/RSV plus assay is intended as an aid in the diagnosis of influenza from Nasopharyngeal swab specimens and should not be used as a sole basis for treatment. Nasal washings  and aspirates are unacceptable for Xpert Xpress SARS-CoV-2/FLU/RSV testing.  Fact Sheet for Patients: BloggerCourse.com  Fact Sheet for Healthcare Providers: SeriousBroker.it  This test is not yet approved or cleared by the [place] FDA and has been authorized for detection and/or diagnosis of SARS-CoV-2 by FDA under an Emergency Use Authorization (EUA). This EUA will remain in effect (meaning this test can be used) for the duration of the COVID-19 declaration under Section 564(b)(1) of the Act, 21 U.S.C. section 360bbb-3(b)(1), unless the authorization is terminated or revoked.     Resp Syncytial Virus by PCR NEGATIVE NEGATIVE Final    Comment: (NOTE) Fact Sheet for Patients: BloggerCourse.com  Fact Sheet for Healthcare Providers: SeriousBroker.it  This test is not yet approved or cleared by the [place] FDA and has been authorized for detection and/or diagnosis of SARS-CoV-2 by FDA under an Emergency Use Authorization (EUA). This EUA will remain in effect (meaning this test can be used) for the duration of the COVID-19 declaration under Section 564(b)(1) of the Act, 21 U.S.C. section 360bbb-3(b)(1), unless the authorization is terminated  or revoked.  Performed at [facility], [address], [place], [place] [address]   Urine Culture     Status: None   Collection Time: 10/29/23  1:15 PM   Specimen: Urine, Clean Catch  Result Value Ref Range Status   Specimen Description   Final    URINE, CLEAN CATCH Performed at [facility], [address], [place], [place] [address]    Special Requests   Final    NONE Performed at [facility], [address], [place], [place] [address]    Culture   Final    NO GROWTH Performed at [facility], [address], [place], [place] [address]    Report Status 10/30/2023 FINAL  Final  MRSA Next Gen by PCR, Nasal     Status: None   Collection Time: 10/29/23  1:21 PM   Specimen: Nasal Mucosa; Nasal Swab  Result Value Ref Range Status   MRSA by PCR Next Gen NOT DETECTED NOT DETECTED Final    Comment: (NOTE) The GeneXpert MRSA Assay (FDA approved for NASAL specimens only), is one component of a comprehensive MRSA colonization surveillance program. It is not intended to diagnose MRSA infection nor to guide or monitor treatment for MRSA infections. Test performance is not FDA approved in patients less than 40 years old. Performed at [facility], [address] [place], [place] [address]   C Difficile Quick Screen w PCR reflex     Status: None   Collection Time: 10/30/23 12:11 PM   Specimen: STOOL  Result Value Ref Range Status   C Diff antigen NEGATIVE NEGATIVE Final   C Diff toxin NEGATIVE NEGATIVE Final   C Diff interpretation No C. difficile detected.  Final    Comment: Performed at [facility], [address] [place], [place] [address]     Scheduled Meds:  aspirin  325 mg Oral Daily   Chlorhexidine Gluconate Cloth  6 each Topical Q0600   enoxaparin (LOVENOX) injection  30 mg Subcutaneous Q24H   feeding supplement  237 mL Oral BID BM   insulin aspart  0-5 Units Subcutaneous QHS   insulin aspart  0-9 Units Subcutaneous TID WC   insulin aspart  3 Units Subcutaneous TID  WC   insulin glargine-yfgn  17 Units Subcutaneous Daily   metoprolol tartrate  12.5 mg Oral BID   mouth rinse  15 mL Mouth Rinse 4 times per  day   pantoprazole  40 mg Oral Daily   simvastatin  40 mg Oral QHS   vitamin B-12  500 mcg Oral Daily   Continuous Infusions:  meropenem (MERREM) IV 1 g (11/01/23 1009)   sodium bicarbonate 75 mEq in sodium chloride 0.45 % 1,075 mL infusion 50 mL/hr at 11/01/23 0858    Procedures/Studies: US RENAL Result Date: 10/30/2023 CLINICAL DATA:  Acute kidney injury EXAM: RENAL / URINARY TRACT ULTRASOUND COMPLETE COMPARISON:  Renal stone protocol CT 08/04/2022 FINDINGS: Right Kidney: Renal measurements: 12.0 x 5.8 x 4.9 cm = volume: 120 mL. Echogenicity within normal limits. No mass or hydronephrosis visualized. Trace right perinephric fluid, of doubtful clinical significance. Left Kidney: Renal measurements: 12.8 x 5.8 x 5.4 cm = volume: 209 mL. Echogenicity within normal limits. No mass or hydronephrosis visualized. Bladder: Unable to evaluate due to collapse configuration. Other: None. IMPRESSION: No significant sonographic abnormality of the kidneys. Electronically Signed   By: Acquanetta Belling M.D.   On: 10/30/2023 18:38   DG CHEST PORT 1 VIEW Result Date: 10/30/2023 CLINICAL DATA:  Shortness of breath.  Weakness. EXAM: PORTABLE CHEST 1 VIEW COMPARISON:  Chest pain and dated 10/29/2023. FINDINGS: There is mild cardiomegaly and mild vascular congestion. No focal consolidation, pleural effusion or pneumothorax. Acute osseous pathology. IMPRESSION: Mild cardiomegaly and mild vascular congestion. Electronically Signed   By: Elgie Collard M.D.   On: 10/30/2023 16:24   DG Chest Port 1 View Result Date: 10/29/2023 CLINICAL DATA:  Shortness of breath EXAM: PORTABLE CHEST 1 VIEW COMPARISON:  None Available. FINDINGS: Mild cardiomegaly with mild central vascular congestion. No focal consolidation, pleural effusion, pneumothorax. Osseous pathology. IMPRESSION: Mild  cardiomegaly with mild central vascular congestion. Electronically Signed   By: Elgie Collard M.D.   On: 10/29/2023 13:54     SIGNED: Kendell Bane, MD, FHM. FAAFP Critical care time spent 55 minutes seeing evaluating patient, reviewing medications, labs, drawn plan of care   [facility],  Pager (please use [email or web address] to page/text)  Please use Epic Secure Chat for non-urgent communication (7AM-7PM) If 7PM-7AM, please contact night-coverage [email or web address],  11/01/2023, 10:28 AM  11/01/2023, 10:28 AM   LOS: 3 days

## 2023-11-01 NOTE — Consult Note (Signed)
 Nephrology Consult   Requesting provider: Nevin Bloodgood Service requesting consult: Hospitalist Reason for consult: AKI on CKD 3a   Assessment/Recommendations: Melanie Spencer is a/an 63 y.o. female with a past medical history HTN, HLD, DM2, CVA who present w/ sepsis c/b AKI on CKD3a   Non-Oliguric AKI on CKD 3a: baseline creatinine around 1.3.likely ATN in the setting of shock.  Also exposed ace inhibitor at home as well as possible NSAID use.Seems to be improving with increasing urine output and stabilizing creatinine -nno Lasix or IV fluids needed -Continue to monitor daily Cr, Dose meds for GFR -Monitor Daily I/Os, Daily weight  -Maintain MAP>65 for optimal renal perfusion.  -Avoid nephrotoxic medications including NSAIDs -Use synthetic opioids (Fentanyl/Dilaudid) if needed -Currently no indication for HD -We will sign off tomorrow if creatinine is improving  Septic shock:required pressors during this admission.  Has received IV fluids.  Likely urinary source.  Management per primary  Acute hypoxic respiratory failure: Likely multifactorial with some volume overload.  Urine output spontaneously improving.  IV Lasix today must hypoxia worsens  Hypertension: Holding home ACE inhibitor.  Can restart amlodipine  Anemia: Multifactorial.  Management per primary team.  Transfuse if needed  Uncontrolled Diabetes Mellitus Type 2 with Hyperglycemia: management per primary team    Recommendations conveyed to primary service.    Darnell Level [facility] 11/01/2023 10:46 AM   _____________________________________________________________________________________ CC: confusion and weakness  History of Present Illness: Melanie Spencer is a/an 63 y.o. female with a past medical history of HTN, HLD, DM2, CVA who presents with confusion and weakness.  Patient presented 3 days ago after spouse noted worsening weakness at home with difficulties ambulating.  Was unable to get  out of bed and also appear very tired and more confused than at baseline.  They noted no other significant symptoms.  In the emergency department patient had urinalysis concerning for UTI and elevated lactic acid.  Was started on IV pressors and placed on antibiotics.  Patient continued to require pressors for 1-2 days.  Eventually weaned off.  Received significant IV fluids.  Patient is now more stable restarting home blood pressure medications.  Creatinine at baseline appears to be around 1.3.  Creatinine increased up to 3.2 today but urine output seems to be improving.patient states she feels like she repeat all night.  She is unsure if she took any NSAIDs at home.  Currently denies fevers, chills, chest pain, nausea, vomiting, diarrhea, dysuria, hematuria.  Feels a little short of breath   Medications:  Current Facility-Administered Medications  Medication Dose Route Frequency Provider Last Rate Last Admin   acetaminophen (TYLENOL) tablet 650 mg  650 mg Oral Q6H PRN Tat, Onalee Hua, MD   650 mg at 10/31/23 1801   Or   acetaminophen (TYLENOL) suppository 650 mg  650 mg Rectal Q6H PRN Tat, Onalee Hua, MD       aspirin tablet 325 mg  325 mg Oral Daily Tat, David, MD   325 mg at 11/01/23 0802   Chlorhexidine Gluconate Cloth 2 % PADS 6 each  6 each Topical Q0600 Tat, David, MD   6 each at 11/01/23 0335   enoxaparin (LOVENOX) injection 30 mg  30 mg Subcutaneous Q24H Earnie Larsson, RPH   30 mg at 10/31/23 2115   hydrALAZINE (APRESOLINE) injection 5 mg  5 mg Intravenous Q6H PRN Tat, Onalee Hua, MD   5 mg at 10/31/23 1733   insulin aspart (novoLOG) injection 0-5 Units  0-5 Units Subcutaneous QHS Tat,  Onalee Hua, MD   5 Units at 10/30/23 2148   insulin aspart (novoLOG) injection 0-9 Units  0-9 Units Subcutaneous TID WC Tat, Onalee Hua, MD   3 Units at 11/01/23 0803   insulin aspart (novoLOG) injection 5 Units  5 Units Subcutaneous TID WC Shahmehdi, Gemma Payor, MD       [START ON 11/02/2023] insulin glargine-yfgn (SEMGLEE) injection  25 Units  25 Units Subcutaneous Daily Shahmehdi, Seyed A, MD       meropenem (MERREM) 1 g in sodium chloride 0.9 % 100 mL IVPB  1 g Intravenous Q12H Earnie Larsson, RPH 200 mL/hr at 11/01/23 1009 1 g at 11/01/23 1009   metoprolol tartrate (LOPRESSOR) tablet 12.5 mg  12.5 mg Oral BID Tat, Onalee Hua, MD   12.5 mg at 11/01/23 0803   ondansetron (ZOFRAN) tablet 4 mg  4 mg Oral Q6H PRN Tat, Onalee Hua, MD       Or   ondansetron [facility]) injection 4 mg  4 mg Intravenous Q6H PRN Tat, Onalee Hua, MD   4 mg at 10/31/23 1954   Oral care mouth rinse  15 mL Mouth Rinse 4 times per day Catarina Hartshorn, MD   15 mL at 11/01/23 0804   Oral care mouth rinse  15 mL Mouth Rinse PRN Tat, Onalee Hua, MD       pantoprazole (PROTONIX) EC tablet 40 mg  40 mg Oral Daily Tat, David, MD   40 mg at 11/01/23 0802   simvastatin (ZOCOR) tablet 40 mg  40 mg Oral Maura Crandall, MD   40 mg at 10/31/23 2115   sodium bicarbonate 75 mEq in sodium chloride 0.45 % 1,075 mL infusion   Intravenous Continuous Nevin Bloodgood A, MD 50 mL/hr at 11/01/23 0858 New Bag at 11/01/23 0858   sodium chloride (OCEAN) 0.65 % nasal spray 1 spray  1 spray Each Nare PRN Adefeso, Oladapo, DO   1 spray at 10/30/23 2103   vitamin B-12 (CYANOCOBALAMIN) tablet 500 mcg  500 mcg Oral Daily Tat, Onalee Hua, MD   500 mcg at 11/01/23 1610     ALLERGIES Patient has no known allergies.  MEDICAL HISTORY Past Medical History:  Diagnosis Date   Cerebrovascular disease 05/23/2019   Essential hypertension, benign 05/23/2019   History of stroke 2007   left leg weakness   HLD (hyperlipidemia) 05/23/2019   Morbid obesity (HCC) 05/23/2019   Type II diabetes mellitus, uncontrolled 05/23/2019   UTI (urinary tract infection)    Vitamin D deficiency      SOCIAL HISTORY Social History   Socioeconomic History   Marital status: Married    Spouse name: Not on file   Number of children: Not on file   Years of education: 13   Highest education level: Some college, no degree  Occupational  History   Occupation: Disability    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  Tobacco Use   Smoking status: Former    Current packs/day: 0.00    Average packs/day: 2.0 packs/day for 20.0 years (40.0 ttl pk-yrs)    Types: Cigarettes    Start date: 07/26/1985    Quit date: 07/26/2005    Years since quitting: 18.2   Smokeless tobacco: Never  Vaping Use   Vaping status: Never Used  Substance and Sexual Activity   Alcohol use: No    Alcohol/week: 0.0 standard drinks of alcohol   Drug use: No   Sexual activity: Not Currently    Birth control/protection: Post-menopausal  Other Topics Concern   Not  on file  Social History Narrative   Married for 31 yrs.On disability secondary to CVA.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   Social Drivers of Corporate investment banker Strain: Low Risk  (11/23/2022)   Received [facility]   Overall Financial Resource Strain (CARDIA)    Difficulty of Paying Living Expenses: Not very hard  Food Insecurity: No Food Insecurity (10/29/2023)   Hunger Vital Sign    Worried About Running Out of Food in the Last Year: Never true    Ran Out of Food in the Last Year: Never true  Transportation Needs: No Transportation Needs (10/29/2023)   PRAPARE - Administrator, Civil Service (Medical): No    Lack of Transportation (Non-Medical): No  Physical Activity: Inactive (07/14/2023)   Received from [facility]   Exercise Vital Sign    Days of Exercise per Week: 0 days    Minutes of Exercise per Session: 0 min  Stress: No Stress Concern Present (07/14/2023)   Received from [facility] Occupational [facility] - Occupational Stress Questionnaire    Feeling of Stress : Only a little  Social Connections: Moderately Integrated (07/14/2023)   Received from [facility]   Social Connection and Isolation Panel [NHANES]    Frequency of Communication with Friends and Family: Three times a week    Frequency of  Social Gatherings with Friends and Family: Three times a week    Attends Religious Services: More than 4 times per year    Active Member of Clubs or Organizations: No    Attends Banker Meetings: Never    Marital Status: Married  Catering manager Violence: Not At Risk (10/29/2023)   Humiliation, Afraid, Rape, and Kick questionnaire    Fear of Current or Ex-Partner: No    Emotionally Abused: No    Physically Abused: No    Sexually Abused: No     FAMILY HISTORY Family History  Problem Relation Age of Onset   Heart disease Mother    Diabetes Mother    Hypertension Mother    Cancer Father        Lung   Hypertension Sister    Macular degeneration Sister    Appendicitis Brother    Hypertension Sister    Diabetes Sister       Review of Systems: 12 systems reviewed Otherwise as per HPI, all other systems reviewed and negative  Physical Exam: Vitals:   11/01/23 0900 11/01/23 1000  BP: (!) 129/46 (!) 140/71  Pulse: 91 78  Resp: (!) 25 (!) 24  Temp:  99.7 F (37.6 C)  SpO2: 93% 93%   Total I/O In: -  Out: 350 [Urine:350]  Intake/Output Summary (Last 24 hours) at 11/01/2023 1046 Last data filed at 11/01/2023 0830 Gross per 24 hour  Intake 800.25 ml  Output 1900 ml  Net -1099.75 ml   General: ill-appearing, no distress HEENT: anicteric sclera, oropharynx clear without lesions CV: normal rate, no murmurs, 1+ pitting edema Lungs: clear to auscultation anteriorly, no increased work of breathing Abd: soft, non-tender, non-distended Skin: no visible lesions or rashes Psych: alert, engaged, appropriate mood and affect Musculoskeletal: no obvious deformities Neuro: normal speech, Kipp Brood person but not place or time  Test Results Reviewed Lab Results  Component Value Date   NA 137 11/01/2023   K 3.7 11/01/2023   CL 102 11/01/2023   CO2 20 (L) 11/01/2023   BUN 59 (H) 11/01/2023  CREATININE 3.22 (H) 11/01/2023   CALCIUM 8.1 (L) 11/01/2023   ALBUMIN 2.0 (L)  11/01/2023   PHOS 3.1 10/17/2021    CBC Recent Labs  Lab 10/30/23 0513 10/31/23 0406 11/01/23 0912  WBC 27.8* 25.6* 14.2*  NEUTROABS  --   --  12.4*  HGB 10.1* 9.4* 9.3*  HCT 33.3* 30.0* 30.5*  MCV 84.5 83.6 82.2  PLT 140* 103* 101*    I have reviewed all relevant outside healthcare records related to the patient's current hospitalization

## 2023-11-01 NOTE — Progress Notes (Signed)
 Patient's temperature is elevated at 101.8, cooling blanket turned back on. Dr. Flossie Dibble made aware.

## 2023-11-02 DIAGNOSIS — R531 Weakness: Secondary | ICD-10-CM | POA: Diagnosis not present

## 2023-11-02 DIAGNOSIS — N1831 Chronic kidney disease, stage 3a: Secondary | ICD-10-CM | POA: Diagnosis not present

## 2023-11-02 DIAGNOSIS — G9341 Metabolic encephalopathy: Secondary | ICD-10-CM | POA: Diagnosis not present

## 2023-11-02 LAB — BASIC METABOLIC PANEL WITH GFR
Anion gap: 12 (ref 5–15)
BUN: 62 mg/dL — ABNORMAL HIGH (ref 8–23)
CO2: 21 mmol/L — ABNORMAL LOW (ref 22–32)
Calcium: 7.9 mg/dL — ABNORMAL LOW (ref 8.9–10.3)
Chloride: 104 mmol/L (ref 98–111)
Creatinine, Ser: 2.94 mg/dL — ABNORMAL HIGH (ref 0.44–1.00)
GFR, Estimated: 17 mL/min — ABNORMAL LOW (ref >60)
Glucose, Bld: 258 mg/dL — ABNORMAL HIGH (ref 70–99)
Potassium: 3.6 mmol/L (ref 3.5–5.1)
Sodium: 137 mmol/L (ref 135–145)

## 2023-11-02 LAB — GLUCOSE, CAPILLARY
Glucose-Capillary: 254 mg/dL — ABNORMAL HIGH (ref 70–99)
Glucose-Capillary: 282 mg/dL — ABNORMAL HIGH (ref 70–99)
Glucose-Capillary: 234 mg/dL — ABNORMAL HIGH (ref 70–99)
Glucose-Capillary: 236 mg/dL — ABNORMAL HIGH (ref 70–99)

## 2023-11-02 LAB — CBC
HCT: 27.8 % — ABNORMAL LOW (ref 36.0–46.0)
Hemoglobin: 8.4 g/dL — ABNORMAL LOW (ref 12.0–15.0)
MCH: 24.9 pg — ABNORMAL LOW (ref 26.0–34.0)
MCHC: 30.2 g/dL (ref 30.0–36.0)
MCV: 82.2 fL (ref 80.0–100.0)
Platelets: 78 10*3/uL — ABNORMAL LOW (ref 150–400)
RBC: 3.38 MIL/uL — ABNORMAL LOW (ref 3.87–5.11)
RDW: 15.7 % — ABNORMAL HIGH (ref 11.5–15.5)
WBC: 8.8 10*3/uL (ref 4.0–10.5)
nRBC: 0 % (ref 0.0–0.2)

## 2023-11-02 MED ORDER — RISAQUAD PO CAPS
2.0000 | ORAL_CAPSULE | Freq: Three times a day (TID) | ORAL | Status: DC
  Administered 2023-11-02 – 2023-11-05 (×10): 2 via ORAL
  Filled 2023-11-02 (×10): qty 2

## 2023-11-02 MED ORDER — AMLODIPINE BESYLATE 5 MG PO TABS
5.0000 mg | ORAL_TABLET | Freq: Every day | ORAL | Status: DC
  Administered 2023-11-02: 5 mg via ORAL
  Filled 2023-11-02: qty 1

## 2023-11-02 MED ORDER — INSULIN GLARGINE-YFGN 100 UNIT/ML ~~LOC~~ SOLN
30.0000 [IU] | Freq: Every day | SUBCUTANEOUS | Status: DC
  Filled 2023-11-02: qty 0.3

## 2023-11-02 MED ORDER — LIDOCAINE 5 % EX PTCH
1.0000 | MEDICATED_PATCH | CUTANEOUS | Status: DC
  Administered 2023-11-02 – 2023-11-07 (×5): 1 via TRANSDERMAL
  Filled 2023-11-02 (×6): qty 1

## 2023-11-02 MED ORDER — ASPIRIN 81 MG PO TBEC
81.0000 mg | DELAYED_RELEASE_TABLET | Freq: Every day | ORAL | Status: DC
  Administered 2023-11-03 – 2023-11-08 (×6): 81 mg via ORAL
  Filled 2023-11-02 (×6): qty 1

## 2023-11-02 MED ORDER — HEPARIN SODIUM (PORCINE) 5000 UNIT/ML IJ SOLN
5000.0000 [IU] | Freq: Three times a day (TID) | INTRAMUSCULAR | Status: DC
  Administered 2023-11-02 – 2023-11-08 (×16): 5000 [IU] via SUBCUTANEOUS
  Filled 2023-11-02 (×17): qty 1

## 2023-11-02 NOTE — Progress Notes (Signed)
 PROGRESS NOTE  Melanie Spencer ZOX:[id number] DOB: 08/02/1960 DOA: 10/29/2023 PCP: No primary care provider on file.     Subjective: The patient was seen and examined this morning, stable awake alert oriented following commands Still requiring 2 L of oxygen, satting 92% -Chronic left-sided weakness, hemiplegia due to previous stroke, with dense left arm paralysis -Per PT could not ambulate independently this morning-  -Per PT and her husband now she needs assist with all ADLs   Brief History:  63 year old female with a history of hypertension, hyperlipidemia, diabetes mellitus type 2, stroke with residual left hemiparesis presenting with generalized weakness and confusion that began on 10/25/2023.  The patient has underlying cognitive impairment.  History is obtained from speaking with the patient and spouse at the bedside.  At baseline, the patient is able to ambulate with a cane and standby assist.  However spouse has noted that the patient has had increasing weakness as the week has progressed.  On the evening of 10/28/2023, the patient was not able to get out of bed because of worsening weakness.  Spouse has also endorsed increasing somnolence.  At baseline, the patient speaks fluently, but is pleasantly confused and A&O x 1.  Spouse states that she has been started on Lyrica about a week prior to this admission.  She was previously taking gabapentin.  There is no other new medications. There is been no complaints of chest pain, shortness breath, coughing, hemoptysis, vomiting, diarrhea, abdominal pain.  Oral intake has been decreased this week.  Patient has approximately 20-pack-year history of tobacco after quitting smoking in 2007. In the ED, the patient was afebrile and hemodynamically stable with oxygen saturation 94% room air.  WBC 10.9, hemoglobin 10.1, platelets 233.  Sodium 138, potassium 4.2, bicarbonate 20, serum creatinine 1.59.  Lactic acid 3.7.  UA >50 WBC, >50 RBC.  The  patient was started on cefepime.  She was admitted for further evaluation and treatment.  She became hypotension and she was started on levophed.  Her antibiotic coverage was broadened with zyvox and merrem.  She was weaned off of levophed.   Assessment/Plan:  Severe sepsis with septic shock-[place] source UTI  -Continue to show signs of improvement, 101.8 in past 24 hours currently 98.3, RR 21, blood pressure elevated 161/60  -POA:  tachycardia, tachypnea, confusion -Lactic acid 3.7>>5.2>>2.6 -Patient required Levophed on 10/29/2023>> weaned off and on 10/30/23 -Tolerating p.o., reducing IVF, -Continuing IV antibiotics of meropenem  -Follow blood and urine cultures--neg to date -Chest x-ray--increased interstitial markings    UTI -UA>50 WBC -Unfortunately, initial urine cultures were not sent at time of admission   Acute respiratory failure with hypoxia -Secondary to fluid overload -Remained on 2 L of oxygen, satting 93% -10/31/2023 IV furosemide x 1   Acute on chronic CKD 3a -Nephrology consulted-appreciate follow-up and recommendations -baseline creatinine 1.3-1.4 -Secondary to sepsis and hypotension -Serum creatinine peaked 2.58>>3.05 >>3.22  With BUN of 59, GFR 16  Lab Results  Component Value Date   CREATININE 2.94 (H) 11/02/2023   CREATININE 3.22 (H) 11/01/2023   CREATININE 3.05 (H) 10/31/2023    -Renal ultrasound--No significant sonographic abnormality of the kidneys. Consulting nephrology for evaluation    Acute metabolic encephalopathy Improved back to baseline -Secondary to UTI and sepsis -Check VBG--7.32/39/42/20 -B12--169>>> replete -Holding Lyrica temporarily  -d/c oxycodone -now having hospital delirium   Uncontrolled diabetes mellitus type 2 with hyperglycemia -NovoLog sliding scale -07/14/2023 hemoglobin A1c 9.1 -Start reduced  dose Semglee>>increase from 17 >>> 30  units -add novolog with meals -Hold metformin and pioglitazone CBG (last 3)   Recent Labs    11/01/23 2124 11/02/23 0804 11/02/23 1125  GLUCAP 220* 254* 282*         Essential hypertension -Holding ramipril secondary to AKI -start amlodipine   Mixed hyperlipidemia -Continue statin   Morbid obesity -BMI 41.60 -Lifestyle modification   History of stroke -Continue aspirin -With left-sided weakness, hemiplegia, dense left arm paralysis, minimal left leg strength   Severe debility -With previous stroke, severe left-sided weakness, global generalized weaknesses -Per PT and patient's husband at bedside, patient needs minimum 2 person assist with ambulation, needs assist with all ADLs -Per PT recommendation to SNF, patient and husband agreeable            Family Communication:   Spouse at bedside 10/31/2023   Consultants:  none   Code Status:  FULL    DVT Prophylaxis:   [place] Lovenox     Procedures: As Listed in Progress Note Above   Antibiotics: Meropenem 10/29/23>> Linezolid 10/29/23>>4/7           Objective: Vitals:   11/02/23 0900 11/02/23 0959 11/02/23 1000 11/02/23 1129  BP: (!) 154/52 (!) 154/52 (!) 161/60   Pulse: 75 77 74   Resp: (!) 22 (!) 22 (!) 21   Temp:    98.3 F (36.8 C)  TempSrc:    Oral  SpO2: 92% 91% 92%   Weight:      Height:        Intake/Output Summary (Last 24 hours) at 11/02/2023 1143 Last data filed at 11/02/2023 1000 Gross per 24 hour  Intake 866.77 ml  Output 1350 ml  Net -483.23 ml   Weight change:         General:  AAO x 3,  cooperative, no distress; morbidly obese, chronically ill looking female On supplemental oxygen  HEENT:  Normocephalic, PERRL, otherwise with in Normal limits   Neuro:  CNII-XII intact. , normal motor and sensation, reflexes intact   Lungs:   Clear to auscultation BL, Respirations unlabored,  No wheezes / crackles  Cardio:    S1/S2, RRR, No murmure, No Rubs or Gallops   Abdomen:  Soft, non-tender, bowel sounds active all four quadrants, no guarding or peritoneal  signs.  Muscular  skeletal:  Left-sided hemiplegia-dense left arm paralysis (due to previous stroke) Limited exam -Sever global generalized weaknesses - in bed, able to move all 4 extremities,   2+ pulses,  symmetric, No pitting edema  Skin:  Dry, warm to touch, negative for any Rashes,  Wounds: Please see nursing documentation         Data Reviewed: I have personally reviewed following labs and imaging studies Basic Metabolic Panel: Recent Labs  Lab 10/29/23 1341 10/30/23 0513 10/31/23 0406 11/01/23 0912 11/02/23 0455  NA 138 136 136 137 137  K 3.5 4.9 4.2 3.7 3.6  CL 107 103 102 102 104  CO2 18* 18* 21* 20* 21*  GLUCOSE 163* 304* 315* 319* 258*  BUN 37* 46* 57* 59* 62*  CREATININE 1.99* 2.58* 3.05* 3.22* 2.94*  CALCIUM 8.3* 7.9* 7.8* 8.1* 7.9*   Liver Function Tests: Recent Labs  Lab 10/29/23 1341 11/01/23 0912  AST 42* 14*  ALT 27 27  ALKPHOS 111 179*  BILITOT 0.7 0.6  PROT 5.6* 6.3*  ALBUMIN 2.5* 2.0*   No results for input(s): "LIPASE", "AMYLASE" in the last 168 hours. No results for input(s): "AMMONIA"  in the last 168 hours. Coagulation Profile: No results for input(s): "INR", "PROTIME" in the last 168 hours. CBC: Recent Labs  Lab 10/29/23 1341 10/30/23 0513 10/31/23 0406 11/01/23 0912 11/02/23 0455  WBC 12.2* 27.8* 25.6* 14.2* 8.8  NEUTROABS  --   --   --  12.4*  --   HGB 9.3* 10.1* 9.4* 9.3* 8.4*  HCT 29.5* 33.3* 30.0* 30.5* 27.8*  MCV 82.6 84.5 83.6 82.2 82.2  PLT 152 140* 103* 101* 78*   Cardiac Enzymes: No results for input(s): "CKTOTAL", "CKMB", "CKMBINDEX", "TROPONINI" in the last 168 hours. BNP: Invalid input(s): "POCBNP" CBG: Recent Labs  Lab 11/01/23 1136 11/01/23 1700 11/01/23 2124 11/02/23 0804 11/02/23 1125  GLUCAP 274* 227* 220* 254* 282*   HbA1C: No results for input(s): "HGBA1C" in the last 72 hours.  Urine analysis:    Component Value Date/Time   COLORURINE YELLOW 10/29/2023 0300   APPEARANCEUR CLOUDY (A)  10/29/2023 0300   APPEARANCEUR Cloudy (A) 11/25/2022 1655   LABSPEC 1.021 10/29/2023 0300   PHURINE 5.0 10/29/2023 0300   GLUCOSEU >=500 (A) 10/29/2023 0300   HGBUR MODERATE (A) 10/29/2023 0300   BILIRUBINUR NEGATIVE 10/29/2023 0300   BILIRUBINUR Negative 11/25/2022 1655   KETONESUR NEGATIVE 10/29/2023 0300   PROTEINUR 100 (A) 10/29/2023 0300   NITRITE POSITIVE (A) 10/29/2023 0300   LEUKOCYTESUR LARGE (A) 10/29/2023 0300   Sepsis Labs: @LABRCNTIP (procalcitonin:4,lacticidven:4) ) Recent Results (from the past 240 hours)  Blood culture (routine x 2)     Status: None (Preliminary result)   Collection Time: 10/29/23  4:34 AM   Specimen: Left Antecubital; Blood  Result Value Ref Range Status   Specimen Description LEFT ANTECUBITAL  Final   Special Requests   Final    BOTTLES DRAWN AEROBIC AND ANAEROBIC Blood Culture adequate volume   Culture   Final    NO GROWTH 4 DAYS Performed at [facility], [address], [place], [place] [address]    Report Status PENDING  Incomplete  Blood culture (routine x 2)     Status: None (Preliminary result)   Collection Time: 10/29/23  4:56 AM   Specimen: BLOOD RIGHT HAND  Result Value Ref Range Status   Specimen Description BLOOD RIGHT HAND  Final   Special Requests   Final    BOTTLES DRAWN AEROBIC ONLY Blood Culture adequate volume   Culture   Final    NO GROWTH 4 DAYS Performed at [facility], [address], [place], [place] [address]    Report Status PENDING  Incomplete  Resp panel by RT-PCR (RSV, Flu A&B, Covid) Anterior Nasal Swab     Status: None   Collection Time: 10/29/23  5:58 AM   Specimen: Anterior Nasal Swab  Result Value Ref Range Status   SARS Coronavirus 2 by RT PCR NEGATIVE NEGATIVE Final    Comment: (NOTE) SARS-CoV-2 target nucleic acids are NOT DETECTED.  The SARS-CoV-2 RNA is generally detectable in upper respiratory specimens during the acute phase of infection. The lowest concentration of SARS-CoV-2 viral copies this  assay can detect is 138 copies/mL. A negative result does not preclude SARS-Cov-2 infection and should not be used as the sole basis for treatment or other patient management decisions. A negative result may occur with  improper specimen collection/handling, submission of specimen other than nasopharyngeal swab, presence of viral mutation(s) within the areas targeted by this assay, and inadequate number of viral copies(<138 copies/mL). A negative result must be combined with clinical observations, patient history, and epidemiological information. The  expected result is Negative.  Fact Sheet for Patients:  BloggerCourse.com  Fact Sheet for Healthcare Providers:  SeriousBroker.it  This test is no t yet approved or cleared by the [place] FDA and  has been authorized for detection and/or diagnosis of SARS-CoV-2 by FDA under an Emergency Use Authorization (EUA). This EUA will remain  in effect (meaning this test can be used) for the duration of the COVID-19 declaration under Section 564(b)(1) of the Act, 21 U.S.C.section 360bbb-3(b)(1), unless the authorization is terminated  or revoked sooner.       Influenza A by PCR NEGATIVE NEGATIVE Final   Influenza B by PCR NEGATIVE NEGATIVE Final    Comment: (NOTE) The Xpert Xpress SARS-CoV-2/FLU/RSV plus assay is intended as an aid in the diagnosis of influenza from Nasopharyngeal swab specimens and should not be used as a sole basis for treatment. Nasal washings and aspirates are unacceptable for Xpert Xpress SARS-CoV-2/FLU/RSV testing.  Fact Sheet for Patients: BloggerCourse.com  Fact Sheet for Healthcare Providers: SeriousBroker.it  This test is not yet approved or cleared by the [place] FDA and has been authorized for detection and/or diagnosis of SARS-CoV-2 by FDA under an Emergency Use Authorization (EUA). This EUA will  remain in effect (meaning this test can be used) for the duration of the COVID-19 declaration under Section 564(b)(1) of the Act, 21 U.S.C. section 360bbb-3(b)(1), unless the authorization is terminated or revoked.     Resp Syncytial Virus by PCR NEGATIVE NEGATIVE Final    Comment: (NOTE) Fact Sheet for Patients: BloggerCourse.com  Fact Sheet for Healthcare Providers: SeriousBroker.it  This test is not yet approved or cleared by the [place] FDA and has been authorized for detection and/or diagnosis of SARS-CoV-2 by FDA under an Emergency Use Authorization (EUA). This EUA will remain in effect (meaning this test can be used) for the duration of the COVID-19 declaration under Section 564(b)(1) of the Act, 21 U.S.C. section 360bbb-3(b)(1), unless the authorization is terminated or revoked.  Performed at [facility], [address], [place], [place] [address]   Urine Culture     Status: None   Collection Time: 10/29/23  1:15 PM   Specimen: Urine, Clean Catch  Result Value Ref Range Status   Specimen Description   Final    URINE, CLEAN CATCH Performed at [facility], [address], [place], [place] [address]    Special Requests   Final    NONE Performed at [facility], [address] [place], [place] [address]    Culture   Final    NO GROWTH Performed at [facility], [address], [place], [place] [address]    Report Status 10/30/2023 FINAL  Final  MRSA Next Gen by PCR, Nasal     Status: None   Collection Time: 10/29/23  1:21 PM   Specimen: Nasal Mucosa; Nasal Swab  Result Value Ref Range Status   MRSA by PCR Next Gen NOT DETECTED NOT DETECTED Final    Comment: (NOTE) The GeneXpert MRSA Assay (FDA approved for NASAL specimens only), is one component of a comprehensive MRSA colonization surveillance program. It is not intended to diagnose MRSA infection nor to guide or monitor treatment for MRSA  infections. Test performance is not FDA approved in patients less than 38 years old. Performed at [facility], [address] [place], [place] [address]   C Difficile Quick Screen w PCR reflex     Status: None   Collection Time: 10/30/23 12:11 PM   Specimen:  STOOL  Result Value Ref Range Status   C Diff antigen NEGATIVE NEGATIVE Final   C Diff toxin NEGATIVE NEGATIVE Final   C Diff interpretation No C. difficile detected.  Final    Comment: Performed at [facility], [address] [place], [place] [address]     Scheduled Meds:  acidophilus  2 capsule Oral TID   aspirin  325 mg Oral Daily   Chlorhexidine Gluconate Cloth  6 each Topical Q0600   enoxaparin (LOVENOX) injection  30 mg Subcutaneous Q24H   insulin aspart  0-5 Units Subcutaneous QHS   insulin aspart  0-9 Units Subcutaneous TID WC   insulin aspart  5 Units Subcutaneous TID WC   insulin glargine-yfgn  25 Units Subcutaneous Daily   metoprolol tartrate  50 mg Oral BID   mouth rinse  15 mL Mouth Rinse 4 times per day   pantoprazole  40 mg Oral Daily   simvastatin  40 mg Oral QHS   vitamin B-12  500 mcg Oral Daily   Continuous Infusions:  meropenem (MERREM) IV 1 g (11/02/23 1001)    Procedures/Studies: US RENAL Result Date: 10/30/2023 CLINICAL DATA:  Acute kidney injury EXAM: RENAL / URINARY TRACT ULTRASOUND COMPLETE COMPARISON:  Renal stone protocol CT 08/04/2022 FINDINGS: Right Kidney: Renal measurements: 12.0 x 5.8 x 4.9 cm = volume: 120 mL. Echogenicity within normal limits. No mass or hydronephrosis visualized. Trace right perinephric fluid, of doubtful clinical significance. Left Kidney: Renal measurements: 12.8 x 5.8 x 5.4 cm = volume: 209 mL. Echogenicity within normal limits. No mass or hydronephrosis visualized. Bladder: Unable to evaluate due to collapse configuration. Other: None. IMPRESSION: No significant sonographic abnormality of the kidneys. Electronically Signed   By: Acquanetta Belling M.D.   On: 10/30/2023 18:38    DG CHEST PORT 1 VIEW Result Date: 10/30/2023 CLINICAL DATA:  Shortness of breath.  Weakness. EXAM: PORTABLE CHEST 1 VIEW COMPARISON:  Chest pain and dated 10/29/2023. FINDINGS: There is mild cardiomegaly and mild vascular congestion. No focal consolidation, pleural effusion or pneumothorax. Acute osseous pathology. IMPRESSION: Mild cardiomegaly and mild vascular congestion. Electronically Signed   By: Elgie Collard M.D.   On: 10/30/2023 16:24   DG Chest Port 1 View Result Date: 10/29/2023 CLINICAL DATA:  Shortness of breath EXAM: PORTABLE CHEST 1 VIEW COMPARISON:  None Available. FINDINGS: Mild cardiomegaly with mild central vascular congestion. No focal consolidation, pleural effusion, pneumothorax. Osseous pathology. IMPRESSION: Mild cardiomegaly with mild central vascular congestion. Electronically Signed   By: Elgie Collard M.D.   On: 10/29/2023 13:54     SIGNED: Kendell Bane, MD, FHM. FAAFP Critical care time spent 55 minutes seeing evaluating patient, reviewing medications, labs, drawn plan of care   [facility],  Pager (please use [email or web address] to page/text)  Please use Epic Secure Chat for non-urgent communication (7AM-7PM) If 7PM-7AM, please contact night-coverage [email or web address],  11/02/2023, 11:43 AM  11/02/2023, 11:43 AM   LOS: 4 days

## 2023-11-02 NOTE — Progress Notes (Signed)
 Nephrology Follow-Up Consult note   Assessment/Recommendations: Melanie Spencer is a/an 62 y.o. female with a past medical history significant for HTN, HLD, DM2, CVA who present w/ sepsis c/b AKI on CKD3a    Non-Oliguric AKI on CKD 3a: baseline creatinine around 1.3.likely ATN in the setting of shock.  Also exposed ace inhibitor at home as well as possible NSAID use. Seems to be improving with increasing urine output and down trending creatinine -Mostly supportive care -Continue to monitor daily Cr, Dose meds for GFR -Monitor Daily I/Os, Daily weight  -Maintain MAP>65 for optimal renal perfusion.  -Avoid nephrotoxic medications including NSAIDs -Use synthetic opioids (Fentanyl/Dilaudid) if needed -Currently no indication for HD -Likely sign off soon as she improves   Septic shock:required pressors during this admission.  Has received IV fluids.  Likely urinary source.  Management per primary.  Overall improving   Acute hypoxic respiratory failure: Likely multifactorial with some volume overload.  Now resolved   Hypertension: Holding home ACE inhibitor.  On home amlodipine.   Anemia: Multifactorial.  Management per primary team.  Transfuse if needed   Uncontrolled Diabetes Mellitus Type 2 with Hyperglycemia: management per primary team   Recommendations conveyed to primary service.    Darnell [facility] 11/02/2023 8:51 AM  ___________________________________________________________  CC: Confusion and weakness  Interval History/Subjective: Patient feels well today.  Shortness of breath improved.  Feels like she is urinating a lot   Medications:  Current Facility-Administered Medications  Medication Dose Route Frequency Provider Last Rate Last Admin   acetaminophen (TYLENOL) tablet 650 mg  650 mg Oral Q6H PRN Tat, Onalee Hua, MD   650 mg at 11/02/23 0605   Or   acetaminophen (TYLENOL) suppository 650 mg  650 mg Rectal Q6H PRN Tat, Onalee Hua, MD       acidophilus  (RISAQUAD) capsule 2 capsule  2 capsule Oral TID Nevin Bloodgood A, MD       aspirin tablet 325 mg  325 mg Oral Daily Tat, David, MD   325 mg at 11/01/23 0802   Chlorhexidine Gluconate Cloth 2 % PADS 6 each  6 each Topical Q0600 Tat, David, MD   6 each at 11/02/23 0605   enoxaparin (LOVENOX) injection 30 mg  30 mg Subcutaneous Q24H Earnie Larsson, RPH   30 mg at 11/01/23 2106   hydrALAZINE (APRESOLINE) injection 10 mg  10 mg Intravenous Q4H PRN Shahmehdi, Seyed A, MD       insulin aspart (novoLOG) injection 0-5 Units  0-5 Units Subcutaneous QHS Catarina Hartshorn, MD   2 Units at 11/01/23 2127   insulin aspart (novoLOG) injection 0-9 Units  0-9 Units Subcutaneous TID WC Tat, Onalee Hua, MD   5 Units at 11/02/23 0828   insulin aspart (novoLOG) injection 5 Units  5 Units Subcutaneous TID WC Shahmehdi, Seyed A, MD   5 Units at 11/02/23 0828   insulin glargine-yfgn (SEMGLEE) injection 25 Units  25 Units Subcutaneous Daily Shahmehdi, Seyed A, MD       meropenem (MERREM) 1 g in sodium chloride 0.9 % 100 mL IVPB  1 g Intravenous Q12H Earnie Larsson, RPH 200 mL/hr at 11/01/23 2106 1 g at 11/01/23 2106   metoprolol tartrate (LOPRESSOR) tablet 50 mg  50 mg Oral BID Nevin Bloodgood A, MD   50 mg at 11/01/23 2107   ondansetron (ZOFRAN) tablet 4 mg  4 mg Oral Q6H PRN Catarina Hartshorn, MD       Or   ondansetron [facility]) injection 4  mg  4 mg Intravenous Q6H PRN Tat, Onalee Hua, MD   4 mg at 10/31/23 1954   Oral care mouth rinse  15 mL Mouth Rinse 4 times per day Tat, Onalee Hua, MD   15 mL at 11/02/23 1610   Oral care mouth rinse  15 mL Mouth Rinse PRN Tat, Onalee Hua, MD       pantoprazole (PROTONIX) EC tablet 40 mg  40 mg Oral Daily Tat, David, MD   40 mg at 11/01/23 0802   simvastatin (ZOCOR) tablet 40 mg  40 mg Oral Maura Crandall, MD   40 mg at 11/01/23 2107   sodium chloride (OCEAN) 0.65 % nasal spray 1 spray  1 spray Each Nare PRN Adefeso, Oladapo, DO   1 spray at 10/30/23 2103   vitamin B-12 (CYANOCOBALAMIN) tablet 500 mcg  500 mcg  Oral Daily Tat, David, MD   500 mcg at 11/01/23 9604      Review of Systems: 10 systems reviewed and negative except per interval history/subjective  Physical Exam: Vitals:   11/02/23 0727 11/02/23 0800  BP:  (!) 175/68  Pulse:  62  Resp:  18  Temp: 98.6 F (37 C)   SpO2:  97%   No intake/output data recorded.  Intake/Output Summary (Last 24 hours) at 11/02/2023 0851 Last data filed at 11/02/2023 0603 Gross per 24 hour  Intake 766.77 ml  Output 1350 ml  Net -583.23 ml   Constitutional: well-appearing, no acute distress ENMT: ears and nose without scars or lesions, MMM CV: normal rate, 1+ edema in BLE Respiratory: clear to auscultation, normal work of breathing Gastrointestinal: soft, non-tender, no palpable masses or hernias Skin: Bilateral venous stasis changes on the shins, otherwise no visible lesions or rashes Psych: alert, judgement/insight appropriate, appropriate mood and affect   Test Results I personally reviewed new and old clinical labs and radiology tests Lab Results  Component Value Date   NA 137 11/02/2023   K 3.6 11/02/2023   CL 104 11/02/2023   CO2 21 (L) 11/02/2023   BUN 62 (H) 11/02/2023   CREATININE 2.94 (H) 11/02/2023   CALCIUM 7.9 (L) 11/02/2023   ALBUMIN 2.0 (L) 11/01/2023   PHOS 3.1 10/17/2021    CBC Recent Labs  Lab 10/31/23 0406 11/01/23 0912 11/02/23 0455  WBC 25.6* 14.2* 8.8  NEUTROABS  --  12.4*  --   HGB 9.4* 9.3* 8.4*  HCT 30.0* 30.5* 27.8*  MCV 83.6 82.2 82.2  PLT 103* 101* 78*

## 2023-11-02 NOTE — Plan of Care (Signed)
  Problem: Acute Rehab OT Goals (only OT should resolve) Goal: Pt. Will Perform Grooming Flowsheets (Taken 11/02/2023 0949) Pt Will Perform Grooming:  with modified independence  with set-up  sitting Goal: Pt. Will Perform Upper Body Dressing Flowsheets (Taken 11/02/2023 0949) Pt Will Perform Upper Body Dressing:  with modified independence  sitting Goal: Pt. Will Perform Lower Body Dressing Flowsheets (Taken 11/02/2023 0949) Pt Will Perform Lower Body Dressing:  with mod assist  sitting/lateral leans  with adaptive equipment Goal: Pt. Will Transfer To Toilet Flowsheets (Taken 11/02/2023 [phone]) Pt Will Transfer to Toilet:  with mod assist  stand pivot transfer Goal: Pt. Will Perform Toileting-Clothing Manipulation Flowsheets (Taken 11/02/2023 0949) Pt Will Perform Toileting - Clothing Manipulation and hygiene:  with mod assist  sitting/lateral leans Goal: Pt/Caregiver Will Perform Home Exercise Program Flowsheets (Taken 11/02/2023 [id number]) Pt/caregiver will Perform Home Exercise Program:  Increased strength  Increased ROM  Right Upper extremity  Left upper extremity  With minimal assist  Elhadj Girton OT, MOT

## 2023-11-02 NOTE — Plan of Care (Signed)
  Problem: Education: Goal: Knowledge of General Education information will improve Description: Including pain rating scale, medication(s)/side effects and non-pharmacologic comfort measures Outcome: Progressing   Problem: Health Behavior/Discharge Planning: Goal: Ability to manage health-related needs will improve Outcome: Progressing   Problem: Clinical Measurements: Goal: Ability to maintain clinical measurements within normal limits will improve Outcome: Progressing Goal: Will remain free from infection Outcome: Progressing Goal: Diagnostic test results will improve Outcome: Progressing Goal: Respiratory complications will improve Outcome: Progressing Goal: Cardiovascular complication will be avoided Outcome: Progressing   Problem: Activity: Goal: Risk for activity intolerance will decrease Outcome: Not Progressing   Problem: Nutrition: Goal: Adequate nutrition will be maintained Outcome: Progressing   Problem: Coping: Goal: Level of anxiety will decrease Outcome: Progressing   Problem: Elimination: Goal: Will not experience complications related to bowel motility Outcome: Not Progressing Goal: Will not experience complications related to urinary retention Outcome: Not Progressing   Problem: Pain Managment: Goal: General experience of comfort will improve and/or be controlled Outcome: Not Progressing   Problem: Safety: Goal: Ability to remain free from injury will improve Outcome: Not Progressing   Problem: Skin Integrity: Goal: Risk for impaired skin integrity will decrease Outcome: Progressing   Problem: Education: Goal: Ability to describe self-care measures that may prevent or decrease complications (Diabetes Survival Skills Education) will improve Outcome: Not Progressing Goal: Individualized Educational Video(s) Outcome: Not Progressing   Problem: Coping: Goal: Ability to adjust to condition or change in health will improve Outcome: Progressing    Problem: Fluid Volume: Goal: Ability to maintain a balanced intake and output will improve Outcome: Progressing   Problem: Health Behavior/Discharge Planning: Goal: Ability to identify and utilize available resources and services will improve Outcome: Progressing Goal: Ability to manage health-related needs will improve Outcome: Progressing   Problem: Metabolic: Goal: Ability to maintain appropriate glucose levels will improve Outcome: Progressing   Problem: Nutritional: Goal: Maintenance of adequate nutrition will improve Outcome: Progressing Goal: Progress toward achieving an optimal weight will improve Outcome: Not Progressing   Problem: Skin Integrity: Goal: Risk for impaired skin integrity will decrease Outcome: Not Progressing   Problem: Tissue Perfusion: Goal: Adequacy of tissue perfusion will improve Outcome: Not Progressing

## 2023-11-02 NOTE — Plan of Care (Signed)
  Problem: Acute Rehab PT Goals(only PT should resolve) Goal: Pt Will Go Supine/Side To Sit Flowsheets (Taken 11/02/2023 1129) Pt will go Supine/Side to Sit: with minimal assist Goal: Patient Will Perform Sitting Balance Flowsheets (Taken 11/02/2023 1129) Patient will perform sitting balance: with contact guard assist Goal: Patient Will Transfer Sit To/From Stand Flowsheets (Taken 11/02/2023 1129) Patient will transfer sit to/from stand: with minimal assist Goal: Pt Will Ambulate Flowsheets (Taken 11/02/2023 1129) Pt will Ambulate: 15 feet Note: 1 person hand held assist  Nelida Meuse PT, DPT [facility]-  [phone] office

## 2023-11-02 NOTE — Progress Notes (Addendum)
 0730 patient alert able to make needs known on 2L [place] husband at bedside answers most questions for patient. Husband provides care for patient at home states a decline in mobility.  0900 therapy seen patient at this time patient not safe to get to chair. Therapy is recommending skilled nursing attending aware. 1550 patient refusing turns

## 2023-11-02 NOTE — [place] FL2 (Signed)
 [place] MEDICAID FL2 LEVEL OF CARE FORM     IDENTIFICATION  Patient Name: Melanie Spencer Birthdate: [birth year] Sex: female Admission Date (Current Location): 10/29/2023  [facility] and [place] Number:  [employer] and Address:  [facility],  [address], [place] [address]      Provider Number: [id number]  Attending Physician Name and Address:  Kendell Bane, MD  Relative Name and Phone Number:  Naseem, Adler (Spouse)  [phone]    Current Level of Care: Hospital Recommended Level of Care: Skilled Nursing Facility Prior Approval Number:    Date Approved/Denied:   PASRR Number: [id number]  Discharge Plan: SNF    Current Diagnoses: Patient Active Problem List   Diagnosis Date Noted   Generalized weakness 10/30/2023   UTI (urinary tract infection) 10/29/2023   Sepsis due to undetermined organism (HCC) 10/29/2023   Chronic kidney disease, stage 3a (HCC) 10/29/2023   Urge incontinence 07/26/2023   OAB (overactive bladder) 07/26/2023   Risk for falls 07/14/2023   Primary osteoarthritis of both hips 11/23/2022   Obesity (BMI 30-39.9) 10/15/2021   Normocytic anemia 10/15/2021   Acute metabolic encephalopathy 10/14/2021   GERD (gastroesophageal reflux disease) 10/14/2021   Hyperlipidemia 10/14/2021   Left leg pain 10/14/2021   Type 2 diabetes mellitus with obesity (HCC) 11/09/2019   Osteomyelitis (HCC) 11/09/2019   Essential hypertension, benign 05/23/2019   Morbid obesity (HCC) 05/23/2019   Cerebrovascular disease 05/23/2019   Stroke (HCC)     Orientation RESPIRATION BLADDER Height & Weight     Self, Time  Normal Incontinent Weight: 117.6 kg Height:  5\' 5"  (165.1 cm)  BEHAVIORAL SYMPTOMS/MOOD NEUROLOGICAL BOWEL NUTRITION STATUS      Continent Diet (see DC summary)  AMBULATORY STATUS COMMUNICATION OF NEEDS Skin     Verbally Skin abrasions, Bruising                       Personal Care Assistance Level of Assistance   Bathing, Feeding, Dressing Bathing Assistance: Maximum assistance Feeding assistance: Limited assistance Dressing Assistance: Maximum assistance     Functional Limitations Info  Hearing, Speech, Sight Sight Info: Adequate Hearing Info: Adequate Speech Info: Adequate    SPECIAL CARE FACTORS FREQUENCY  PT (By licensed PT)     PT Frequency: 5 times a week              Contractures Contractures Info: Not present    Additional Factors Info  Code Status, Allergies Code Status Info: Full Allergies Info: NKDA           Current Medications (11/02/2023):  This is the current hospital active medication list Current Facility-Administered Medications  Medication Dose Route Frequency Provider Last Rate Last Admin   acetaminophen (TYLENOL) tablet 650 mg  650 mg Oral Q6H PRN Tat, David, MD   650 mg at 11/02/23 1215   Or   acetaminophen (TYLENOL) suppository 650 mg  650 mg Rectal Q6H PRN Tat, Onalee Hua, MD       acidophilus (RISAQUAD) capsule 2 capsule  2 capsule Oral TID Nevin Bloodgood A, MD   2 capsule at 11/02/23 1002   amLODipine (NORVASC) tablet 5 mg  5 mg Oral Daily Shahmehdi, Seyed A, MD   5 mg at 11/02/23 1208   aspirin EC tablet 81 mg  81 mg Oral Daily Shahmehdi, Seyed A, MD       Chlorhexidine Gluconate Cloth 2 % PADS 6 each  6 each Topical Q0600 Tat, Onalee Hua, MD  6 each at 11/02/23 0605   heparin injection 5,000 Units  5,000 Units Subcutaneous Q8H Shahmehdi, Seyed A, MD   5,000 Units at 11/02/23 1332   hydrALAZINE (APRESOLINE) injection 10 mg  10 mg Intravenous Q4H PRN Shahmehdi, Seyed A, MD       insulin aspart (novoLOG) injection 0-5 Units  0-5 Units Subcutaneous QHS Catarina Hartshorn, MD   2 Units at 11/01/23 2127   insulin aspart (novoLOG) injection 0-9 Units  0-9 Units Subcutaneous TID WC Tat, Onalee Hua, MD   5 Units at 11/02/23 1146   insulin aspart (novoLOG) injection 5 Units  5 Units Subcutaneous TID WC Shahmehdi, Seyed A, MD   5 Units at 11/02/23 1145   [START ON 11/03/2023]  insulin glargine-yfgn (SEMGLEE) injection 30 Units  30 Units Subcutaneous Daily Shahmehdi, Seyed A, MD       meropenem (MERREM) 1 g in sodium chloride 0.9 % 100 mL IVPB  1 g Intravenous Q12H Earnie Larsson, RPH 200 mL/hr at 11/02/23 1300 Infusion Verify at 11/02/23 1300   metoprolol tartrate (LOPRESSOR) tablet 50 mg  50 mg Oral BID Shahmehdi, Seyed A, MD   50 mg at 11/02/23 0959   ondansetron (ZOFRAN) tablet 4 mg  4 mg Oral Q6H PRN Tat, Onalee Hua, MD       Or   ondansetron (ZOFRAN) injection 4 mg  4 mg Intravenous Q6H PRN Tat, Onalee Hua, MD   4 mg at 10/31/23 1954   Oral care mouth rinse  15 mL Mouth Rinse 4 times per day Catarina Hartshorn, MD   15 mL at 11/02/23 8119   Oral care mouth rinse  15 mL Mouth Rinse PRN Tat, Onalee Hua, MD       pantoprazole (PROTONIX) EC tablet 40 mg  40 mg Oral Daily Tat, David, MD   40 mg at 11/02/23 1002   simvastatin (ZOCOR) tablet 40 mg  40 mg Oral Maura Crandall, MD   40 mg at 11/01/23 2107   sodium chloride (OCEAN) 0.65 % nasal spray 1 spray  1 spray Each Nare PRN Adefeso, Oladapo, DO   1 spray at 10/30/23 2103   vitamin B-12 (CYANOCOBALAMIN) tablet 500 mcg  500 mcg Oral Daily Tat, David, MD   500 mcg at 11/02/23 1003     Discharge Medications: Please see discharge summary for a list of discharge medications.  Relevant Imaging Results:  Relevant Lab Results:   Additional Information SS#[id number]  Leitha Bleak, RN

## 2023-11-02 NOTE — Evaluation (Signed)
 Physical Therapy Evaluation Patient Details Name: Melanie Spencer MRN: [id number] DOB: 1961/02/11 Today's Date: 11/02/2023  History of Present Illness  Melanie Spencer is a 63 year old female with a history of hypertension, hyperlipidemia, diabetes mellitus type 2, stroke with residual left hemiparesis presenting with generalized weakness and confusion that began on 10/25/2023.  The patient has underlying cognitive impairment.  History is obtained from speaking with the patient and spouse at the bedside.  At baseline, the patient is able to ambulate with a cane and standby assist.  However spouse has noted that the patient has had increasing weakness as the week has progressed.  On the evening of 10/28/2023, the patient was not able to get out of bed because of worsening weakness.  Spouse has also endorsed increasing somnolence.  At baseline, the patient speaks fluently, but is pleasantly confused and A&O x 1.  Spouse states that she has been started on Lyrica about a week prior to this admission.  She was previously taking gabapentin.  There is no other new medications.  Clinical Impression   Pt tolerated today's Physical Therapy Evaluation, decently, poor carryover for standing and mobility at this time. Pt's baseline is min to mod assist from caregiver for bed mobility, transfers and 1 person HHA for ambulation at household distance. Currently, pt demonstrating significant decline in functional mobility due to muscle weakness, decreased conditioning, balance deficits in setting of medical admission and hx of CVA.   Based upon these deficits/impairments, patient will benefit from continued skilled physical therapy services during remainder of hospital stay and at the next recommended venue of care to address deficits and promote return to optimal function.                If plan is discharge home, recommend the following: Two people to help with walking and/or transfers;Two people to help with  bathing/dressing/bathroom   Can travel by private vehicle        Equipment Recommendations None recommended by PT  Recommendations for Other Services       Functional Status Assessment Patient has had a recent decline in their functional status and demonstrates the ability to make significant improvements in function in a reasonable and predictable amount of time.     Precautions / Restrictions Precautions Precautions: Fall Recall of Precautions/Restrictions: Intact Restrictions Weight Bearing Restrictions Per Provider Order: No      Mobility  Bed Mobility Overal bed mobility: Needs Assistance Bed Mobility: Rolling, Sidelying to Sit, Sit to Supine Rolling: Max assist Sidelying to sit: Max assist   Sit to supine: Max assist   General bed mobility comments: Much assist; poor trunk control ; assist to move B LE to EOB. Husband assist's pt at home. Max assist for scooting anterior. +2 for superior scooting pt in HOB. Patient Response: Cooperative  Transfers                   General transfer comment: PT attempted sit to stand twice with pt without success.    Ambulation/Gait               General Gait Details: Nonambulatory. This has been pt's baseline for 5-6 months.  Stairs            Wheelchair Mobility     Tilt Bed Tilt Bed Patient Response: Cooperative  Modified Rankin (Stroke Patients Only)       Balance Overall balance assessment: Needs assistance Sitting-balance support: Single extremity supported, Feet supported Sitting balance-Leahy Scale: Poor  Sitting balance - Comments: poor to fair Postural control: Posterior lean                                   Pertinent Vitals/Pain Pain Assessment Pain Score: 10-Worst pain ever Pain Location: R hip Pain Descriptors / Indicators: Sharp Pain Intervention(s): Monitored during session, Repositioned    Home Living Family/patient expects to be discharged to:: Private  residence Living Arrangements: Spouse/significant other Available Help at Discharge: Family;Available PRN/intermittently Type of Home: House (double wide) Home Access: Ramped entrance       Home Layout: One level Home Equipment: Agricultural consultant (2 wheels);Cane - single point;BSC/3in1;Other (comment);Wheelchair - manual (shower stool)      Prior Function Prior Level of Function : Needs assist       Physical Assist : Mobility (physical);ADLs (physical) Mobility (physical): Bed mobility;Transfers;Gait ADLs (physical): Dressing;IADLs;Bathing Mobility Comments: Husband provides assist while pt ambulates short distance in the house. SPC used as well. ADLs Comments: Assist for shower transfer; dressing; and IADL's.     Extremity/Trunk Assessment   Upper Extremity Assessment Upper Extremity Assessment: Defer to OT evaluation RUE Deficits / Details: generally weak; able to use functionally. LUE Deficits / Details: Limited by previous stroke. Mostly flaccid with 2-/5 grip strength. Limited to ~50 to 75% available P/ROM before pt reported pain. LUE Coordination: decreased fine motor;decreased gross motor    Lower Extremity Assessment Lower Extremity Assessment: RLE deficits/detail;LLE deficits/detail RLE Deficits / Details: antigravity movement in supine position RLE Sensation: decreased light touch LLE Deficits / Details: limited by previous stroke, majority flaccid with 2+/5 hip abd/adduction in supine. LLE Sensation: decreased light touch    Cervical / Trunk Assessment Cervical / Trunk Assessment: Kyphotic  Communication   Communication Communication: No apparent difficulties    Cognition Arousal: Alert Behavior During Therapy: WFL for tasks assessed/performed                             Following commands: Intact       Cueing Cueing Techniques: Verbal cues, Tactile cues     General Comments      Exercises     Assessment/Plan    PT Assessment  Patient needs continued PT services  PT Problem List Decreased strength;Decreased activity tolerance;Decreased balance;Decreased mobility       PT Treatment Interventions DME instruction;Gait training;Functional mobility training;Therapeutic activities;Therapeutic exercise;Balance training;Neuromuscular re-education    PT Goals (Current goals can be found in the Care Plan section)  Acute Rehab PT Goals Patient Stated Goal: return home PT Goal Formulation: With patient Time For Goal Achievement: 11/16/23 Potential to Achieve Goals: Good    Frequency Min 3X/week     Co-evaluation PT/OT/SLP Co-Evaluation/Treatment: Yes Reason for Co-Treatment: To address functional/ADL transfers PT goals addressed during session: Mobility/safety with mobility OT goals addressed during session: ADL's and self-care       AM-PAC PT "6 Clicks" Mobility  Outcome Measure Help needed turning from your back to your side while in a flat bed without using bedrails?: A Lot Help needed moving from lying on your back to sitting on the side of a flat bed without using bedrails?: A Lot Help needed moving to and from a bed to a chair (including a wheelchair)?: A Lot Help needed standing up from a chair using your arms (e.g., wheelchair or bedside chair)?: Total Help needed to walk in hospital room?: Total Help  needed climbing 3-5 steps with a railing? : Total 6 Click Score: 9    End of Session Equipment Utilized During Treatment: Gait belt Activity Tolerance: Patient tolerated treatment well Patient left: in bed;with call bell/phone within reach;with family/visitor present Nurse Communication: Mobility status PT Visit Diagnosis: Unsteadiness on feet (R26.81);Other abnormalities of gait and mobility (R26.89);Muscle weakness (generalized) (M62.81)    Time: 1610-9604 PT Time Calculation (min) (ACUTE ONLY): 24 min   Charges:   PT Evaluation $PT Eval Low Complexity: 1 Low PT Treatments $Therapeutic  Activity: 8-22 mins PT General Charges $$ ACUTE PT VISIT: 1 Visit         Elie Goody, DPT [facility]- [place] [phone] office  Nelida Meuse 11/02/2023, 11:26 AM

## 2023-11-02 NOTE — TOC Progression Note (Signed)
 Transition of Care [facility]) - Progression Note    Patient Details  Name: Melanie Spencer MRN: [id number] Date of Birth: Feb 03, 1961  [facility] CM/SW Contact  Leitha Bleak, RN Phone Number: 11/02/2023, 2:03 PM  Clinical Narrative:   PT is recommending SNF. CM spoke with patient's husband, they have discussed and are agreeable. They really want [facility]. FL2 completed and sent out. TOC waiting to hear back from [facility]. Will follow up with spouse on bed offers.     Expected Discharge Plan: Skilled Nursing Facility Barriers to Discharge: Continued Medical Work up  Expected Discharge Plan and Services       Living arrangements for the past 2 months: Single Family Home                          Social Determinants of Health (SDOH) Interventions SDOH Screenings   Food Insecurity: No Food Insecurity (10/29/2023)  Housing: Low Risk  (10/29/2023)  Transportation Needs: No Transportation Needs (10/29/2023)  Utilities: Not At Risk (10/29/2023)  Alcohol Screen: Low Risk  (07/13/2021)  Depression (PHQ2-9): Low Risk  (07/13/2021)  Financial Resource Strain: Low Risk  (11/23/2022)   Received from [facility]  Physical Activity: Inactive (07/14/2023)   Received from [facility]  Social Connections: Moderately Integrated (07/14/2023)   Received from [facility]  Stress: No Stress Concern Present (07/14/2023)   Received from [facility]  Tobacco Use: Medium Risk (10/31/2023)   Received from [facility] Literacy: Low Risk  (07/14/2023)   Received from [facility]    Readmission Risk Interventions    10/16/2021    3:21 PM  Readmission Risk Prevention Plan  Post Dischage Appt Complete  Medication Screening Complete  Transportation Screening Complete

## 2023-11-02 NOTE — Evaluation (Signed)
 Occupational Therapy Evaluation Patient Details Name: Melanie Spencer MRN: [id number] DOB: 10-13-1960 Today's Date: 11/02/2023   History of Present Illness   Melanie Spencer is a 63 year old female with a history of hypertension, hyperlipidemia, diabetes mellitus type 2, stroke with residual left hemiparesis presenting with generalized weakness and confusion that began on 10/25/2023.  The patient has underlying cognitive impairment.  History is obtained from speaking with the patient and spouse at the bedside.  At baseline, the patient is able to ambulate with a cane and standby assist.  However spouse has noted that the patient has had increasing weakness as the week has progressed.  On the evening of 10/28/2023, the patient was not able to get out of bed because of worsening weakness.  Spouse has also endorsed increasing somnolence.  At baseline, the patient speaks fluently, but is pleasantly confused and A&O x 1.  Spouse states that she has been started on Lyrica about a week prior to this admission.  She was previously taking gabapentin.  There is no other new medications. (per MD)     Clinical Impressions Pt agreeable to OT and PT co-evaluation. Pt has been assisted by husband for mobility with increased difficulty over the past 5 months. Pt is assisted for shower transfer, dressing, and IADL's per family report. L UE mostly flaccid by previous stroke. Max A for bed mobility today. Pt unable to stand despite 2 attempts with physical therapy. Pt is usually able to transfer with husband with less assist needed than what was presented today. Pt left in the bed with call bell within reach and family present. Pt will benefit from continued OT in the hospital and recommended venue below to increase strength, balance, and endurance for safe ADL's.        If plan is discharge home, recommend the following:   Two people to help with walking and/or transfers;A lot of help with  bathing/dressing/bathroom;Assistance with cooking/housework;Assistance with feeding;Assist for transportation;Help with stairs or ramp for entrance     Functional Status Assessment   Patient has had a recent decline in their functional status and demonstrates the ability to make significant improvements in function in a reasonable and predictable amount of time.     Equipment Recommendations   None recommended by OT             Precautions/Restrictions   Precautions Precautions: Fall Recall of Precautions/Restrictions: Intact Restrictions Weight Bearing Restrictions Per Provider Order: No     Mobility Bed Mobility Overal bed mobility: Needs Assistance Bed Mobility: Rolling, Sidelying to Sit, Sit to Supine Rolling: Max assist Sidelying to sit: Max assist   Sit to supine: Max assist   General bed mobility comments: Much assist; poor trunk control ; assist to move B LE to EOB. Husband assist's pt at home.    Transfers                   General transfer comment: PT attempted sit to stand twice with pt without success.      Balance Overall balance assessment: Needs assistance Sitting-balance support: Single extremity supported, Feet supported Sitting balance-Leahy Scale: Poor Sitting balance - Comments: poor to fair Postural control: Posterior lean                                 ADL either performed or assessed with clinical judgement   ADL Overall ADL's : Needs assistance/impaired Eating/Feeding: Set up;Sitting  Grooming: Set up;Sitting   Upper Body Bathing: Maximal assistance;Moderate assistance;Bed level   Lower Body Bathing: Total assistance;Bed level   Upper Body Dressing : Moderate assistance;Maximal assistance;Sitting;Bed level   Lower Body Dressing: Total assistance;Bed level;Sitting/lateral leans   Toilet Transfer: Total assistance;+2 for physical assistance Toilet Transfer Details (indicate cue type and reason):  uanble to complete due to pt weakness Toileting- Clothing Manipulation and Hygiene: Total assistance;Bed level               Vision Baseline Vision/History: 1 Wears glasses Ability to See in Adequate Light: 1 Impaired Patient Visual Report: Blurring of vision Additional Comments: Pt reported continued blurry vision but stated it was when she was not wearing her glasses. Monitory vision during time of stay.     Perception Perception: Not tested       Praxis Praxis: Not tested       Pertinent Vitals/Pain Pain Assessment Pain Assessment: 0-10 Pain Score: 10-Worst pain ever Pain Location: R hip Pain Descriptors / Indicators: Sharp Pain Intervention(s): Monitored during session, Repositioned     Extremity/Trunk Assessment Upper Extremity Assessment Upper Extremity Assessment: RUE deficits/detail;LUE deficits/detail RUE Deficits / Details: generally weak; able to use functionally. LUE Deficits / Details: Limited by previous stroke. Mostly flaccid with 2-/5 grip strength. Limited to ~50 to 75% available P/ROM before pt reported pain. LUE Coordination: decreased fine motor;decreased gross motor   Lower Extremity Assessment Lower Extremity Assessment: Defer to PT evaluation       Communication Communication Communication: No apparent difficulties   Cognition Arousal: Alert Behavior During Therapy: WFL for tasks assessed/performed Cognition: No apparent impairments                               Following commands: Intact       Cueing  General Comments   Cueing Techniques: Verbal cues;Tactile cues                 Home Living Family/patient expects to be discharged to:: Private residence Living Arrangements: Spouse/significant other Available Help at Discharge: Family;Available PRN/intermittently Type of Home: House (double wide) Home Access: Ramped entrance     Home Layout: One level     Bathroom Shower/Tub: Higher education careers adviser: Standard Bathroom Accessibility: No   Home Equipment: Agricultural consultant (2 wheels);Cane - single point;BSC/3in1;Other (comment);Wheelchair - manual (shower stool)          Prior Functioning/Environment Prior Level of Function : Needs assist       Physical Assist : Mobility (physical);ADLs (physical) Mobility (physical): Bed mobility;Transfers;Gait ADLs (physical): Dressing;IADLs;Bathing Mobility Comments: Husband provides assist while pt ambulates short distance in the house. SPC used as well. ADLs Comments: Assist for shower transfer; dressing; and IADL's.    OT Problem List: Decreased strength;Decreased range of motion;Decreased activity tolerance;Impaired balance (sitting and/or standing);Obesity;Impaired UE functional use   OT Treatment/Interventions: Self-care/ADL training;Therapeutic exercise;Therapeutic activities;Patient/family education;Balance training;Visual/perceptual remediation/compensation      OT Goals(Current goals can be found in the care plan section)   Acute Rehab OT Goals Patient Stated Goal: improve function OT Goal Formulation: With patient/family Time For Goal Achievement: 11/16/23 Potential to Achieve Goals: Fair   OT Frequency:  Min 2X/week    Co-evaluation PT/OT/SLP Co-Evaluation/Treatment: Yes Reason for Co-Treatment: To address functional/ADL transfers   OT goals addressed during session: ADL's and self-care  End of Session Equipment Utilized During Treatment: Gait belt  Activity Tolerance: Patient tolerated treatment well Patient left: in bed;with call bell/phone within reach;with family/visitor present  OT Visit Diagnosis: Unsteadiness on feet (R26.81);Other abnormalities of gait and mobility (R26.89);Muscle weakness (generalized) (M62.81)                Time: 1610-9604 OT Time Calculation (min): 27 min Charges:  OT General Charges $OT Visit: 1 Visit OT Evaluation $OT Eval Low Complexity: 1  Low  Maliea Grandmaison OT, MOT  Danie Chandler 11/02/2023, 9:47 AM

## 2023-11-03 DIAGNOSIS — N1831 Chronic kidney disease, stage 3a: Secondary | ICD-10-CM | POA: Diagnosis not present

## 2023-11-03 DIAGNOSIS — R531 Weakness: Secondary | ICD-10-CM | POA: Diagnosis not present

## 2023-11-03 DIAGNOSIS — G9341 Metabolic encephalopathy: Secondary | ICD-10-CM | POA: Diagnosis not present

## 2023-11-03 LAB — GLUCOSE, CAPILLARY
Glucose-Capillary: 203 mg/dL — ABNORMAL HIGH (ref 70–99)
Glucose-Capillary: 269 mg/dL — ABNORMAL HIGH (ref 70–99)
Glucose-Capillary: 242 mg/dL — ABNORMAL HIGH (ref 70–99)
Glucose-Capillary: 153 mg/dL — ABNORMAL HIGH (ref 70–99)

## 2023-11-03 LAB — CBC
HCT: 31.2 % — ABNORMAL LOW (ref 36.0–46.0)
Hemoglobin: 9.6 g/dL — ABNORMAL LOW (ref 12.0–15.0)
MCH: 25.1 pg — ABNORMAL LOW (ref 26.0–34.0)
MCHC: 30.8 g/dL (ref 30.0–36.0)
MCV: 81.7 fL (ref 80.0–100.0)
Platelets: 117 10*3/uL — ABNORMAL LOW (ref 150–400)
RBC: 3.82 MIL/uL — ABNORMAL LOW (ref 3.87–5.11)
RDW: 15.6 % — ABNORMAL HIGH (ref 11.5–15.5)
WBC: 6.8 10*3/uL (ref 4.0–10.5)
nRBC: 0 % (ref 0.0–0.2)

## 2023-11-03 LAB — BASIC METABOLIC PANEL WITH GFR
Anion gap: 10 (ref 5–15)
BUN: 53 mg/dL — ABNORMAL HIGH (ref 8–23)
CO2: 24 mmol/L (ref 22–32)
Calcium: 8.3 mg/dL — ABNORMAL LOW (ref 8.9–10.3)
Chloride: 106 mmol/L (ref 98–111)
Creatinine, Ser: 2.3 mg/dL — ABNORMAL HIGH (ref 0.44–1.00)
GFR, Estimated: 23 mL/min — ABNORMAL LOW (ref >60)
Glucose, Bld: 202 mg/dL — ABNORMAL HIGH (ref 70–99)
Potassium: 3.6 mmol/L (ref 3.5–5.1)
Sodium: 140 mmol/L (ref 135–145)

## 2023-11-03 LAB — CULTURE, BLOOD (ROUTINE X 2)
Culture: NO GROWTH
Culture: NO GROWTH
Special Requests: ADEQUATE
Special Requests: ADEQUATE

## 2023-11-03 MED ORDER — LABETALOL HCL 200 MG PO TABS
200.0000 mg | ORAL_TABLET | Freq: Two times a day (BID) | ORAL | Status: DC
  Administered 2023-11-03 – 2023-11-08 (×11): 200 mg via ORAL
  Filled 2023-11-03 (×11): qty 1

## 2023-11-03 MED ORDER — INSULIN GLARGINE-YFGN 100 UNIT/ML ~~LOC~~ SOLN
35.0000 [IU] | Freq: Every day | SUBCUTANEOUS | Status: DC
  Administered 2023-11-03 – 2023-11-05 (×3): 35 [IU] via SUBCUTANEOUS
  Filled 2023-11-03 (×4): qty 0.35

## 2023-11-03 MED ORDER — AMLODIPINE BESYLATE 5 MG PO TABS
10.0000 mg | ORAL_TABLET | Freq: Every day | ORAL | Status: DC
  Administered 2023-11-03 – 2023-11-08 (×6): 10 mg via ORAL
  Filled 2023-11-03 (×6): qty 2

## 2023-11-03 NOTE — Progress Notes (Signed)
 PROGRESS NOTE  Melanie Spencer:[id number] DOB: [birth year] DOA: 10/29/2023 PCP: No primary care provider on file.     Subjective:  The patient was seen and examined this morning, awake alert oriented, following command no acute distress Blood pressure noted to be elevated-denies any headaches visual changes, shortness of breath or chest pain   -Acute on chronic left-sided weakness, hemiplegia due to previous stroke, with dense left arm paralysis-exacerbated by this admission -Per PT could not ambulate independently this morning-  -Per PT and her husband now she needs assist with all ADLs   Brief History:  63 year old female with a history of hypertension, hyperlipidemia, diabetes mellitus type 2, stroke with residual left hemiparesis presenting with generalized weakness and confusion that began on 10/25/2023.  The patient has underlying cognitive impairment.  History is obtained from speaking with the patient and spouse at the bedside.  At baseline, the patient is able to ambulate with a cane and standby assist.  However spouse has noted that the patient has had increasing weakness as the week has progressed.  On the evening of 10/28/2023, the patient was not able to get out of bed because of worsening weakness.  Spouse has also endorsed increasing somnolence.  At baseline, the patient speaks fluently, but is pleasantly confused and A&O x 1.  Spouse states that she has been started on Lyrica about a week prior to this admission.  She was previously taking gabapentin.  There is no other new medications. There is been no complaints of chest pain, shortness breath, coughing, hemoptysis, vomiting, diarrhea, abdominal pain.  Oral intake has been decreased this week.  Patient has approximately 20-pack-year history of tobacco after quitting smoking in 2007. In the ED, the patient was afebrile and hemodynamically stable with oxygen saturation 94% room air.  WBC 10.9, hemoglobin 10.1, platelets  233.  Sodium 138, potassium 4.2, bicarbonate 20, serum creatinine 1.59.  Lactic acid 3.7.  UA >50 WBC, >50 RBC.  The patient was started on cefepime.  She was admitted for further evaluation and treatment.  She became hypotension and she was started on levophed.  Her antibiotic coverage was broadened with zyvox and merrem.  She was weaned off of levophed.   Assessment/Plan:  Severe sepsis with septic shock-[place] source UTI -Resolved sepsis physiology, now hypertensive  -POA:  tachycardia, tachypnea, confusion -Lactic acid 3.7>>5.2>>2.6 -Required Levophed on 10/29/2023>> weaned off and on 10/30/23 -Tolerating p.o., D/Ced  IVF, -Continuing IV antibiotics of meropenem   -Follow blood and urine cultures--neg to date -Chest x-ray--increased interstitial markings    UTI -UA>50 WBC -Unfortunately, initial urine cultures were not sent at time of admission   Acute respiratory failure with hypoxia -Secondary to fluid overload -Remained on 2 L of oxygen, satting 93% -10/31/2023 IV furosemide x 1   Acute on chronic CKD 3a -BUN/creatinine improving -Nephrology consulted-appreciate follow-up and recommendations -baseline creatinine 1.3-1.4 -Secondary to sepsis and hypotension -Serum creatinine peaked 2.58>>3.05 >>3.22  With BUN of 59, GFR 16  Lab Results  Component Value Date   CREATININE 2.30 (H) 11/03/2023   CREATININE 2.94 (H) 11/02/2023   CREATININE 3.22 (H) 11/01/2023    -Renal ultrasound--No significant sonographic abnormality of the kidneys. Consulting nephrology for evaluation    Acute metabolic encephalopathy Improved back to baseline -Secondary to UTI and sepsis -Check VBG--7.32/39/42/20 -B12--169>>> replete -Holding Lyrica temporarily  -d/c oxycodone -now having hospital delirium   Uncontrolled diabetes mellitus type 2 with hyperglycemia -NovoLog sliding scale -  07/14/2023 hemoglobin A1c 9.1 -Start reduced dose Semglee>>increase from 17 >>> 30  units -add novolog with  meals -Hold metformin and pioglitazone CBG (last 3)  Recent Labs    11/02/23 1547 11/02/23 2130 11/03/23 0850  GLUCAP 234* 236* 203*    Accelerated hypertension-with history of Essential hypertension -Holding ramipril secondary to AKI - Amlodipine-increase to 10 mg p.o., switch metoprolol to labetalol 200 mg p.o. twice daily for 05/14/2024   Mixed hyperlipidemia -Continue statin   Morbid obesity -BMI 41.60 -Lifestyle modification   History of stroke -Continue aspirin -With left-sided weakness, hemiplegia, dense left arm paralysis, minimal left leg strength   Severe debility -With previous stroke, severe left-sided weakness, global generalized weaknesses -Per PT and patient's husband at bedside, patient needs minimum 2 person assist with ambulation, needs assist with all ADLs -Per PT recommendation to SNF, patient and husband agreeable            Family Communication:   Spouse at bedside 10/31/2023  Consultants:  none   Code Status:  FULL   DVT Prophylaxis:   Fellows Lovenox        Antibiotics: Meropenem 10/29/23>> Linezolid 10/29/23>>4/7     Disposition -patient may be stable to be discharged to SNF in a.m. 11/04/2023        objective: Vitals:   11/03/23 0732 11/03/23 0800 11/03/23 0900 11/03/23 1019  BP:  (!) 184/77 (!) 195/85 (!) 170/77  Pulse:  74 77   Resp:  15 (!) 28 20  Temp: 98.1 F (36.7 C)     TempSrc: Oral     SpO2:  95% 94%   Weight:      Height:        Intake/Output Summary (Last 24 hours) at 11/03/2023 1142 Last data filed at 11/03/2023 1020 Gross per 24 hour  Intake 0 ml  Output 2850 ml  Net -2850 ml   Weight change:     General:  AAO x 3,  cooperative, no distress;   HEENT:  Normocephalic, PERRL, otherwise with in Normal limits   Neuro:  Left-sided hemiplegia, -due to previous stroke, sensation intact  CNII-XII intact.-Normal motor and sensation on the right reflexes intact   Lungs:   Clear to auscultation BL, Respirations  unlabored,  No wheezes / crackles  Cardio:    S1/S2, RRR, No murmure, No Rubs or Gallops   Abdomen:  Soft, non-tender, bowel sounds active all four quadrants, no guarding or peritoneal signs.  Muscular  skeletal:  Dense left-sided hemiplegia -right upper extremity chronically edematous Limited exam -Sever global generalized weaknesses - in bed, able to move extremities somewhat in bed  Mainly bedbound now  2+ pulses,  symmetric, No pitting edema  Skin:  Dry, warm to touch, negative for any Rashes,  Wounds: Please see nursing documentation             Data Reviewed: I have personally reviewed following labs and imaging studies Basic Metabolic Panel: Recent Labs  Lab 10/30/23 0513 10/31/23 0406 11/01/23 0912 11/02/23 0455 11/03/23 0527  NA 136 136 137 137 140  K 4.9 4.2 3.7 3.6 3.6  CL 103 102 102 104 106  CO2 18* 21* 20* 21* 24  GLUCOSE 304* 315* 319* 258* 202*  BUN 46* 57* 59* 62* 53*  CREATININE 2.58* 3.05* 3.22* 2.94* 2.30*  CALCIUM 7.9* 7.8* 8.1* 7.9* 8.3*   Liver Function Tests: Recent Labs  Lab 10/29/23 1341 11/01/23 0912  AST 42* 14*  ALT 27 27  ALKPHOS 111 179*  BILITOT 0.7 0.6  PROT 5.6* 6.3*  ALBUMIN 2.5* 2.0*   No results for input(s): "LIPASE", "AMYLASE" in the last 168 hours. No results for input(s): "AMMONIA" in the last 168 hours. Coagulation Profile: No results for input(s): "INR", "PROTIME" in the last 168 hours. CBC: Recent Labs  Lab 10/30/23 0513 10/31/23 0406 11/01/23 0912 11/02/23 0455 11/03/23 0527  WBC 27.8* 25.6* 14.2* 8.8 6.8  NEUTROABS  --   --  12.4*  --   --   HGB 10.1* 9.4* 9.3* 8.4* 9.6*  HCT 33.3* 30.0* 30.5* 27.8* 31.2*  MCV 84.5 83.6 82.2 82.2 81.7  PLT 140* 103* 101* 78* 117*   Cardiac Enzymes: No results for input(s): "CKTOTAL", "CKMB", "CKMBINDEX", "TROPONINI" in the last 168 hours. BNP: Invalid input(s): "POCBNP" CBG: Recent Labs  Lab 11/02/23 0804 11/02/23 1125 11/02/23 1547 11/02/23 2130  11/03/23 0850  GLUCAP 254* 282* 234* 236* 203*   HbA1C: No results for input(s): "HGBA1C" in the last 72 hours.  Urine analysis:    Component Value Date/Time   COLORURINE YELLOW 10/29/2023 0300   APPEARANCEUR CLOUDY (A) 10/29/2023 0300   APPEARANCEUR Cloudy (A) 11/25/2022 1655   LABSPEC 1.021 10/29/2023 0300   PHURINE 5.0 10/29/2023 0300   GLUCOSEU >=500 (A) 10/29/2023 0300   HGBUR MODERATE (A) 10/29/2023 0300   BILIRUBINUR NEGATIVE 10/29/2023 0300   BILIRUBINUR Negative 11/25/2022 1655   KETONESUR NEGATIVE 10/29/2023 0300   PROTEINUR 100 (A) 10/29/2023 0300   NITRITE POSITIVE (A) 10/29/2023 0300   LEUKOCYTESUR LARGE (A) 10/29/2023 0300   Sepsis Labs: @LABRCNTIP (procalcitonin:4,lacticidven:4) ) Recent Results (from the past 240 hours)  Blood culture (routine x 2)     Status: None   Collection Time: 10/29/23  4:34 AM   Specimen: Left Antecubital; Blood  Result Value Ref Range Status   Specimen Description LEFT ANTECUBITAL  Final   Special Requests   Final    BOTTLES DRAWN AEROBIC AND ANAEROBIC Blood Culture adequate volume   Culture   Final    NO GROWTH 5 DAYS Performed at [facility], [address], [place], [place] [address]    Report Status 11/03/2023 FINAL  Final  Blood culture (routine x 2)     Status: None   Collection Time: 10/29/23  4:56 AM   Specimen: BLOOD RIGHT HAND  Result Value Ref Range Status   Specimen Description BLOOD RIGHT HAND  Final   Special Requests   Final    BOTTLES DRAWN AEROBIC ONLY Blood Culture adequate volume   Culture   Final    NO GROWTH 5 DAYS Performed at [facility], [address], [place], [place] [address]    Report Status 11/03/2023 FINAL  Final  Resp panel by RT-PCR (RSV, Flu A&B, Covid) Anterior Nasal Swab     Status: None   Collection Time: 10/29/23  5:58 AM   Specimen: Anterior Nasal Swab  Result Value Ref Range Status   SARS Coronavirus 2 by RT PCR NEGATIVE NEGATIVE Final    Comment: (NOTE) SARS-CoV-2 target nucleic  acids are NOT DETECTED.  The SARS-CoV-2 RNA is generally detectable in upper respiratory specimens during the acute phase of infection. The lowest concentration of SARS-CoV-2 viral copies this assay can detect is 138 copies/mL. A negative result does not preclude SARS-Cov-2 infection and should not be used as the sole basis for treatment or other patient management decisions. A negative result may occur with  improper specimen collection/handling, submission of specimen other than nasopharyngeal swab, presence of viral mutation(s) within the  areas targeted by this assay, and inadequate number of viral copies(<138 copies/mL). A negative result must be combined with clinical observations, patient history, and epidemiological information. The expected result is Negative.  Fact Sheet for Patients:  BloggerCourse.com  Fact Sheet for Healthcare Providers:  SeriousBroker.it  This test is no t yet approved or cleared by the [place] FDA and  has been authorized for detection and/or diagnosis of SARS-CoV-2 by FDA under an Emergency Use Authorization (EUA). This EUA will remain  in effect (meaning this test can be used) for the duration of the COVID-19 declaration under Section 564(b)(1) of the Act, 21 U.S.C.section 360bbb-3(b)(1), unless the authorization is terminated  or revoked sooner.       Influenza A by PCR NEGATIVE NEGATIVE Final   Influenza B by PCR NEGATIVE NEGATIVE Final    Comment: (NOTE) The Xpert Xpress SARS-CoV-2/FLU/RSV plus assay is intended as an aid in the diagnosis of influenza from Nasopharyngeal swab specimens and should not be used as a sole basis for treatment. Nasal washings and aspirates are unacceptable for Xpert Xpress SARS-CoV-2/FLU/RSV testing.  Fact Sheet for Patients: BloggerCourse.com  Fact Sheet for Healthcare Providers: SeriousBroker.it  This  test is not yet approved or cleared by the [place] FDA and has been authorized for detection and/or diagnosis of SARS-CoV-2 by FDA under an Emergency Use Authorization (EUA). This EUA will remain in effect (meaning this test can be used) for the duration of the COVID-19 declaration under Section 564(b)(1) of the Act, 21 U.S.C. section 360bbb-3(b)(1), unless the authorization is terminated or revoked.     Resp Syncytial Virus by PCR NEGATIVE NEGATIVE Final    Comment: (NOTE) Fact Sheet for Patients: BloggerCourse.com  Fact Sheet for Healthcare Providers: SeriousBroker.it  This test is not yet approved or cleared by the [place] FDA and has been authorized for detection and/or diagnosis of SARS-CoV-2 by FDA under an Emergency Use Authorization (EUA). This EUA will remain in effect (meaning this test can be used) for the duration of the COVID-19 declaration under Section 564(b)(1) of the Act, 21 U.S.C. section 360bbb-3(b)(1), unless the authorization is terminated or revoked.  Performed at [facility], [address] [place], [place] [address]   Urine Culture     Status: None   Collection Time: 10/29/23  1:15 PM   Specimen: Urine, Clean Catch  Result Value Ref Range Status   Specimen Description   Final    URINE, CLEAN CATCH Performed at [facility], [address], [place], [place] [address]    Special Requests   Final    NONE Performed at [facility], [address], [place], [place] [address]    Culture   Final    NO GROWTH Performed at [facility], [address], [place], [place] [address]    Report Status 10/30/2023 FINAL  Final  MRSA Next Gen by PCR, Nasal     Status: None   Collection Time: 10/29/23  1:21 PM   Specimen: Nasal Mucosa; Nasal Swab  Result Value Ref Range Status   MRSA by PCR Next Gen NOT DETECTED NOT DETECTED Final    Comment: (NOTE) The GeneXpert MRSA Assay (FDA approved for NASAL  specimens only), is one component of a comprehensive MRSA colonization surveillance program. It is not intended to diagnose MRSA infection nor to guide or monitor treatment for MRSA infections. Test performance is not FDA approved in patients less than 23 years old. Performed at [facility], [address] [place],  [place] [address]   C Difficile Quick Screen w PCR reflex     Status: None   Collection Time: 10/30/23 12:11 PM   Specimen: STOOL  Result Value Ref Range Status   C Diff antigen NEGATIVE NEGATIVE Final   C Diff toxin NEGATIVE NEGATIVE Final   C Diff interpretation No C. difficile detected.  Final    Comment: Performed at [facility], [address], [place], [place] [address]     Scheduled Meds:  acidophilus  2 capsule Oral TID   amLODipine  10 mg Oral Daily   aspirin EC  81 mg Oral Daily   Chlorhexidine Gluconate Cloth  6 each Topical Q0600   heparin  5,000 Units Subcutaneous Q8H   insulin aspart  0-5 Units Subcutaneous QHS   insulin aspart  0-9 Units Subcutaneous TID WC   insulin aspart  5 Units Subcutaneous TID WC   insulin glargine-yfgn  35 Units Subcutaneous Daily   labetalol  200 mg Oral BID   lidocaine  1 patch Transdermal Q24H   mouth rinse  15 mL Mouth Rinse 4 times per day   pantoprazole  40 mg Oral Daily   simvastatin  40 mg Oral QHS   vitamin B-12  500 mcg Oral Daily   Continuous Infusions:  meropenem (MERREM) IV 1 g (11/03/23 1003)    Procedures/Studies: US RENAL Result Date: 10/30/2023 CLINICAL DATA:  Acute kidney injury EXAM: RENAL / URINARY TRACT ULTRASOUND COMPLETE COMPARISON:  Renal stone protocol CT 08/04/2022 FINDINGS: Right Kidney: Renal measurements: 12.0 x 5.8 x 4.9 cm = volume: 120 mL. Echogenicity within normal limits. No mass or hydronephrosis visualized. Trace right perinephric fluid, of doubtful clinical significance. Left Kidney: Renal measurements: 12.8 x 5.8 x 5.4 cm = volume: 209 mL. Echogenicity within normal limits. No mass or  hydronephrosis visualized. Bladder: Unable to evaluate due to collapse configuration. Other: None. IMPRESSION: No significant sonographic abnormality of the kidneys. Electronically Signed   By: Acquanetta Belling M.D.   On: 10/30/2023 18:38   DG CHEST PORT 1 VIEW Result Date: 10/30/2023 CLINICAL DATA:  Shortness of breath.  Weakness. EXAM: PORTABLE CHEST 1 VIEW COMPARISON:  Chest pain and dated 10/29/2023. FINDINGS: There is mild cardiomegaly and mild vascular congestion. No focal consolidation, pleural effusion or pneumothorax. Acute osseous pathology. IMPRESSION: Mild cardiomegaly and mild vascular congestion. Electronically Signed   By: Elgie Collard M.D.   On: 10/30/2023 16:24   DG Chest Port 1 View Result Date: 10/29/2023 CLINICAL DATA:  Shortness of breath EXAM: PORTABLE CHEST 1 VIEW COMPARISON:  None Available. FINDINGS: Mild cardiomegaly with mild central vascular congestion. No focal consolidation, pleural effusion, pneumothorax. Osseous pathology. IMPRESSION: Mild cardiomegaly with mild central vascular congestion. Electronically Signed   By: Elgie Collard M.D.   On: 10/29/2023 13:54     SIGNED: Kendell Bane, MD, FHM. FAAFP Critical care time spent 55 minutes seeing evaluating patient, reviewing medications, labs, drawn plan of care   [facility],  Pager (please use [email or web address] to page/text)  Please use Epic Secure Chat for non-urgent communication (7AM-7PM) If 7PM-7AM, please contact night-coverage [email or web address],  11/03/2023, 11:42 AM  11/03/2023, 11:42 AM   LOS: 5 days

## 2023-11-03 NOTE — Progress Notes (Signed)
 11/03/2023 2130 Received pt to room 321 from ICU. Pt is Alert, not following commands, wants to go home.  Oriented pt and husband to room, call light and bed.  Call bell in reach, husband at bedside.  Kathryne Hitch

## 2023-11-03 NOTE — Progress Notes (Signed)
 Went in room to assist with cleaning patient and getting her up out of bed. Patient cussed at staff even when teaching and verbal cueing of what was going to happen/be done. Patient stated she wanted to get up out of bed but when we attempted to assist her, and sat her on the side of the bed, she refused to stand and no longer wanted to get up out of bed. She yelled and cussed at staff. She was laid back down with clean sheets now on bed and patient cleaned up. Husband at bedside the whole time and also attempted to calm/reason with patient which was unsuccessful.

## 2023-11-03 NOTE — Progress Notes (Signed)
 Nephrology Follow-Up note   Assessment/Recommendations: Melanie Spencer is a/an 63 y.o. female with a past medical history significant for HTN, HLD, DM2, CVA who present w/ sepsis c/b AKI on CKD3a    Non-Oliguric AKI on CKD 3a:  - baseline creatinine around 1.3.likely ATN in the setting of shock.  Also with ACE inhibitor at home as well as possible NSAID use.  - Improving with supportive care  - Avoid NSAID use- stop naproxen   Septic shock:required pressors during this admission.  Has received IV fluids.  Likely urinary source.  - blood pressure is improved  -  abx per primary team   Acute hypoxic respiratory failure: Likely multifactorial with some volume overload.  Now resolved   Hypertension:  - Holding home ACE inhibitor and would continue to hold on discharge for now - she got amlodipine 5 mg yesterday and this was just increased to 10 mg daily dosing starting today (hasn't received the dose yet) - note that she is also to start labetalol today which appears to be a new medication   Normocytic Anemia: Multifactorial.  Transfuse PRBC's per primary team.  Improved   Uncontrolled Diabetes Mellitus Type 2 with Hyperglycemia: management per primary team - note that she has been off of metformin and would remain off for now due to AKI   Disposition  - Nephrology will sign off.  I will set up follow-up in our [facility] office in one month      _____________________________________________  CC: Confusion and weakness  Interval History/Subjective:  She had 2.2 liters UOP over 4/9.  She has been in the ICU.  Spoke with her husband at bedside.  They are both glad her kidneys are getting a little better.  Hasn't been as mobile at home after a fall a couple of years ago - they are hoping for SNF/rehab soon.  She was on aleve at home and is willing to stop.   Review of systems:   Denies shortness of breath or chest pain  Denies n/v   Medications:  Current  Facility-Administered Medications  Medication Dose Route Frequency Provider Last Rate Last Admin   acetaminophen (TYLENOL) tablet 650 mg  650 mg Oral Q6H PRN Tat, Onalee Hua, MD   650 mg at 11/03/23 2956   Or   acetaminophen (TYLENOL) suppository 650 mg  650 mg Rectal Q6H PRN Tat, Onalee Hua, MD       acidophilus (RISAQUAD) capsule 2 capsule  2 capsule Oral TID Nevin Bloodgood A, MD   2 capsule at 11/02/23 2051   amLODipine (NORVASC) tablet 10 mg  10 mg Oral Daily Shahmehdi, Seyed A, MD       aspirin EC tablet 81 mg  81 mg Oral Daily Shahmehdi, Seyed A, MD       Chlorhexidine Gluconate Cloth 2 % PADS 6 each  6 each Topical Q0600 Tat, David, MD   6 each at 11/03/23 0507   heparin injection 5,000 Units  5,000 Units Subcutaneous Q8H Shahmehdi, Seyed A, MD   5,000 Units at 11/03/23 0507   hydrALAZINE (APRESOLINE) injection 10 mg  10 mg Intravenous Q4H PRN Shahmehdi, Seyed A, MD       insulin aspart (novoLOG) injection 0-5 Units  0-5 Units Subcutaneous QHS Catarina Hartshorn, MD   2 Units at 11/02/23 2151   insulin aspart (novoLOG) injection 0-9 Units  0-9 Units Subcutaneous TID WC Catarina Hartshorn, MD   2 Units at 11/02/23 1604   insulin aspart (novoLOG) injection 5 Units  5 Units Subcutaneous TID WC Shahmehdi, Seyed A, MD   5 Units at 11/02/23 1604   insulin glargine-yfgn (SEMGLEE) injection 35 Units  35 Units Subcutaneous Daily Shahmehdi, Seyed A, MD       labetalol (NORMODYNE) tablet 200 mg  200 mg Oral BID Shahmehdi, Seyed A, MD       lidocaine (LIDODERM) 5 % 1 patch  1 patch Transdermal Q24H Shahmehdi, Seyed A, MD   1 patch at 11/02/23 1708   meropenem (MERREM) 1 g in sodium chloride 0.9 % 100 mL IVPB  1 g Intravenous Q12H Earnie Larsson, RPH 200 mL/hr at 11/02/23 2050 1 g at 11/02/23 2050   ondansetron (ZOFRAN) tablet 4 mg  4 mg Oral Q6H PRN Tat, Onalee Hua, MD       Or   ondansetron (ZOFRAN) injection 4 mg  4 mg Intravenous Q6H PRN Tat, Onalee Hua, MD   4 mg at 10/31/23 1954   Oral care mouth rinse  15 mL Mouth Rinse 4  times per day Catarina Hartshorn, MD   15 mL at 11/02/23 2152   Oral care mouth rinse  15 mL Mouth Rinse PRN Tat, Onalee Hua, MD       pantoprazole (PROTONIX) EC tablet 40 mg  40 mg Oral Daily Tat, David, MD   40 mg at 11/02/23 1002   simvastatin (ZOCOR) tablet 40 mg  40 mg Oral QHS Catarina Hartshorn, MD   40 mg at 11/02/23 2051   sodium chloride (OCEAN) 0.65 % nasal spray 1 spray  1 spray Each Nare PRN Adefeso, Oladapo, DO   1 spray at 10/30/23 2103   vitamin B-12 (CYANOCOBALAMIN) tablet 500 mcg  500 mcg Oral Daily Tat, Onalee Hua, MD   500 mcg at 11/02/23 1003      Physical Exam: Vitals:   11/03/23 0600 11/03/23 0732  BP: (!) 185/81   Pulse: 73   Resp: 17   Temp:  98.1 F (36.7 C)  SpO2: 94%    No intake/output data recorded.  Intake/Output Summary (Last 24 hours) at 11/03/2023 0911 Last data filed at 11/03/2023 0443 Gross per 24 hour  Intake 296.67 ml  Output 2150 ml  Net -1853.33 ml   General adult female in bed in no acute distress HEENT normocephalic atraumatic extraocular movements intact sclera anicteric Neck supple trachea midline Lungs clear to auscultation bilaterally normal work of breathing at rest  Heart S1S2 no rub Abdomen soft nontender obese habitus Extremities 1+ edema  Psych normal mood and affect Neuro alert and oriented; provides hx and follows commands    Test Results I personally reviewed new and old clinical labs and radiology tests Lab Results  Component Value Date   NA 140 11/03/2023   K 3.6 11/03/2023   CL 106 11/03/2023   CO2 24 11/03/2023   BUN 53 (H) 11/03/2023   CREATININE 2.30 (H) 11/03/2023   CALCIUM 8.3 (L) 11/03/2023   ALBUMIN 2.0 (L) 11/01/2023   PHOS 3.1 10/17/2021    CBC Recent Labs  Lab 11/01/23 0912 11/02/23 0455 11/03/23 0527  WBC 14.2* 8.8 6.8  NEUTROABS 12.4*  --   --   HGB 9.3* 8.4* 9.6*  HCT 30.5* 27.8* 31.2*  MCV 82.2 82.2 81.7  PLT 101* 78* 117*      Estanislado Emms, MD 9:32 AM 11/03/2023

## 2023-11-03 NOTE — TOC Progression Note (Signed)
 Transition of Care [facility]) - Progression Note    Patient Details  Name: Melanie Spencer MRN: [id number] Date of Birth: [birth year]  Transition of Care [facility]) CM/SW Contact  Leitha Bleak, RN Phone Number: 11/03/2023, 9:58 AM  Clinical Narrative:   Discussed bed offers with her spouse, They want PNC. TOC CMA starting INS AUTH, DC possibly tomorrow. PNC updated.    Expected Discharge Plan: Skilled Nursing Facility Barriers to Discharge: Continued Medical Work up  Expected Discharge Plan and Services      Living arrangements for the past 2 months: Single Family Home                       Social Determinants of Health (SDOH) Interventions SDOH Screenings   Food Insecurity: No Food Insecurity (10/29/2023)  Housing: Low Risk  (10/29/2023)  Transportation Needs: No Transportation Needs (10/29/2023)  Utilities: Not At Risk (10/29/2023)  Alcohol Screen: Low Risk  (07/13/2021)  Depression (PHQ2-9): Low Risk  (07/13/2021)  Financial Resource Strain: Low Risk  (11/23/2022)   [facility]  Physical Activity: Inactive (07/14/2023)   Received from [facility]  Social Connections: Moderately Integrated (07/14/2023)   Received from [facility]  Stress: No Stress Concern Present (07/14/2023)   Received from [facility]  Tobacco Use: Medium Risk (10/31/2023)   Received from [facility] Literacy: Low Risk  (07/14/2023)   Received from [facility]    Readmission Risk Interventions    10/16/2021    3:21 PM  Readmission Risk Prevention Plan  Post Dischage Appt Complete  Medication Screening Complete  Transportation Screening Complete

## 2023-11-03 NOTE — Inpatient Diabetes Management (Signed)
 Inpatient Diabetes Program Recommendations  AACE/ADA: New Consensus Statement on Inpatient Glycemic Control  Target Ranges:  Prepandial:   less than 140 mg/dL      Peak postprandial:   less than 180 mg/dL (1-2 hours)      Critically ill patients:  140 - 180 mg/dL    Latest Reference Range & Units 11/03/23 05:27  Glucose 70 - 99 mg/dL 161 (H)    Latest Reference Range & Units 11/02/23 08:04 11/02/23 11:25 11/02/23 15:47 11/02/23 21:30  Glucose-Capillary 70 - 99 mg/dL 096 (H) 045 (H) 409 (H) 236 (H)   Review of Glycemic Control  Diabetes history: DM2 Outpatient Diabetes medications: Lantus 30-45 units at bedtime, Metformin 1000 mg BID, Trulicity 4.5 mg Qweek (Sunday) Current orders for Inpatient glycemic control: Semglee 30 units daily, Novolog 0-9 units TID with meals, Novolog 0-5 units at bedtime, Novolog 5 units TID with meals   Inpatient Diabetes Program Recommendations:    Insulin: Noted Semglee increased to 30 units daily. Please consider increasing meal coverage to Novolog 8 units TID with meals.  Thanks, Orlando Penner, RN, MSN, CDCES Diabetes Coordinator Inpatient Diabetes Program [phone] (Team Pager from 8am to 5pm)

## 2023-11-04 DIAGNOSIS — G9341 Metabolic encephalopathy: Secondary | ICD-10-CM | POA: Diagnosis not present

## 2023-11-04 LAB — BASIC METABOLIC PANEL WITH GFR
Anion gap: 9 (ref 5–15)
BUN: 40 mg/dL — ABNORMAL HIGH (ref 8–23)
CO2: 24 mmol/L (ref 22–32)
Calcium: 8 mg/dL — ABNORMAL LOW (ref 8.9–10.3)
Chloride: 106 mmol/L (ref 98–111)
Creatinine, Ser: 1.73 mg/dL — ABNORMAL HIGH (ref 0.44–1.00)
GFR, Estimated: 33 mL/min — ABNORMAL LOW (ref >60)
Glucose, Bld: 202 mg/dL — ABNORMAL HIGH (ref 70–99)
Potassium: 3.2 mmol/L — ABNORMAL LOW (ref 3.5–5.1)
Sodium: 139 mmol/L (ref 135–145)

## 2023-11-04 LAB — GLUCOSE, CAPILLARY
Glucose-Capillary: 215 mg/dL — ABNORMAL HIGH (ref 70–99)
Glucose-Capillary: 268 mg/dL — ABNORMAL HIGH (ref 70–99)
Glucose-Capillary: 219 mg/dL — ABNORMAL HIGH (ref 70–99)
Glucose-Capillary: 168 mg/dL — ABNORMAL HIGH (ref 70–99)

## 2023-11-04 LAB — CBC
HCT: 28 % — ABNORMAL LOW (ref 36.0–46.0)
Hemoglobin: 8.9 g/dL — ABNORMAL LOW (ref 12.0–15.0)
MCH: 25.9 pg — ABNORMAL LOW (ref 26.0–34.0)
MCHC: 31.8 g/dL (ref 30.0–36.0)
MCV: 81.4 fL (ref 80.0–100.0)
Platelets: 172 10*3/uL (ref 150–400)
RBC: 3.44 MIL/uL — ABNORMAL LOW (ref 3.87–5.11)
RDW: 15.5 % (ref 11.5–15.5)
WBC: 7.7 10*3/uL (ref 4.0–10.5)
nRBC: 0 % (ref 0.0–0.2)

## 2023-11-04 MED ORDER — AMOXICILLIN-POT CLAVULANATE 500-125 MG PO TABS
1.0000 | ORAL_TABLET | Freq: Two times a day (BID) | ORAL | Status: AC
  Administered 2023-11-04 (×2): 1 via ORAL
  Filled 2023-11-04 (×2): qty 1

## 2023-11-04 MED ORDER — POTASSIUM CHLORIDE 20 MEQ PO PACK
40.0000 meq | PACK | Freq: Once | ORAL | Status: AC
  Administered 2023-11-04: 40 meq via ORAL
  Filled 2023-11-04: qty 2

## 2023-11-04 NOTE — Inpatient Diabetes Management (Signed)
 Inpatient Diabetes Program Recommendations  AACE/ADA: New Consensus Statement on Inpatient Glycemic Control (2015)  Target Ranges:  Prepandial:   less than 140 mg/dL      Peak postprandial:   less than 180 mg/dL (1-2 hours)      Critically ill patients:  140 - 180 mg/dL    Latest Reference Range & Units 11/03/23 08:50 11/03/23 12:22 11/03/23 16:14 11/03/23 20:54  Glucose-Capillary 70 - 99 mg/dL 161 (H)  8 units Novolog  35 units Semglee @1007   269 (H)  10 units Novolog  242 (H)  8 units Novolog  153 (H)  (H): Data is abnormally high  Latest Reference Range & Units 11/04/23 07:25  Glucose-Capillary 70 - 99 mg/dL 096 (H)  8 units Novolog  35 units Semglee  (H): Data is abnormally high   Home DM Meds: Lantus 30-45 units at bedtime Metformin 1000 mg BID Trulicity 4.5 mg Qweek (Sunday)    Current Orders: Semglee 35 units daily Novolog 5 units TID with meals Novolog Sensitive Correction Scale/ SSI (0-9 units) TID AC + HS    MD- Please consider:  1. Increase Semglee to 38 units Daily  2. Increase Novolog Meal Coverage to 8 units TID with meals    --Will follow patient during hospitalization--  Ambrose Finland RN, MSN, CDCES Diabetes Coordinator Inpatient Glycemic Control Team Team Pager: [phone] (8a-5p)

## 2023-11-04 NOTE — Care Management Important Message (Signed)
 Important Message  Patient Details  Name: Melanie Spencer MRN: [id number] Date of Birth: Apr 19, 1961   Important Message Given:  Yes - Medicare IM     Corey Harold 11/04/2023, 3:05 PM

## 2023-11-04 NOTE — Progress Notes (Signed)
 Pt Currently has no IV access and one attempt was made and was unsuccessful. The SWAT nurse attempted and pt refused and sated " I will hit you if you try and stick me". MD notified and IV meds changed to oral.

## 2023-11-04 NOTE — Progress Notes (Signed)
 Progress Note   Patient: Melanie Spencer ZOX:[id number] DOB: [birth year] DOA: 10/29/2023     6 DOS: the patient was seen and examined on 11/04/2023   Brief hospital course: 63 year old female with a history of hypertension, hyperlipidemia, diabetes mellitus type 2, stroke with residual left hemiparesis presenting with generalized weakness and confusion that began on 10/25/2023.  The patient has underlying cognitive impairment.  History is obtained from speaking with the patient and spouse at the bedside.  At baseline, the patient is able to ambulate with a cane and standby assist.  However spouse has noted that the patient has had increasing weakness as the week has progressed.  On the evening of 10/28/2023, the patient was not able to get out of bed because of worsening weakness.  Spouse has also endorsed increasing somnolence.  At baseline, the patient speaks fluently, but is pleasantly confused and A&O x 1.  Spouse states that she has been started on Lyrica about a week prior to this admission.  She was previously taking gabapentin.  There is no other new medications. There is been no complaints of chest pain, shortness breath, coughing, hemoptysis, vomiting, diarrhea, abdominal pain.  Oral intake has been decreased this week.  Patient has approximately 20-pack-year history of tobacco after quitting smoking in 2007. In the ED, the patient was afebrile and hemodynamically stable with oxygen saturation 94% room air.  WBC 10.9, hemoglobin 10.1, platelets 233.  Sodium 138, potassium 4.2, bicarbonate 20, serum creatinine 1.59.  Lactic acid 3.7.  UA >50 WBC, >50 RBC.  The patient was started on cefepime.  She was admitted for further evaluation and treatment.  She became hypotension and she was started on levophed.  Her antibiotic coverage was broadened with zyvox and merrem.  She was weaned off of levophed.  Assessment and Plan: Acute metabolic encephalopathy due to severe sepsis due to UTI  - Augmentin 500 mg PO  bid  - Acidophilus 2 capsule PO tid   Acute resp failure with hypoxia  - Resolved   Acute on chronic CKD3a  - Monitor   DM2 w/ hyperglycemia  - Novolog SS tid and at bedtime  - Novolog 5 units sq tid - Semglee 35 units sq daily   HTN  - Norvasc 10 mg PO daily  - Labetalol 200 mg PO bid   HLD - Zocor 40 mg PO at bedtime   Hx of stroke  - ASA 81 mg PO daily  - Zocor 40 mg PO at bedtime   Morbid obesity  - BMI 43.14 kg/m2; complicating care   Severe debility  - Awaiting SNF; complicating care   Subjective: Pt seen and examined at the bedside. Awaiting insurance auth for facility placement. Antibx changed to PO today.  Physical Exam: Vitals:   11/03/23 2133 11/04/23 0349 11/04/23 0900 11/04/23 1259  BP: (!) 144/65 (!) 148/76 (!) 168/62 (!) 146/59  Pulse: 68 69 75 73  Resp: 20 20 18    Temp:  98.6 F (37 C) 99.4 F (37.4 C) 98.9 F (37.2 C)  TempSrc: Oral Oral Axillary Oral  SpO2: 93% 96% 96% 98%  Weight:      Height:       Physical Exam HENT:     Head: Normocephalic.     Mouth/Throat:     Mouth: Mucous membranes are moist.  Cardiovascular:     Rate and Rhythm: Normal rate.  Pulmonary:     Effort: Pulmonary effort is normal.  Abdominal:     Palpations: Abdomen  is soft.  Skin:    General: Skin is warm.  Neurological:     Mental Status: She is alert. Mental status is at baseline.  Psychiatric:        Mood and Affect: Mood normal.        Disposition: Status is: Inpatient Remains inpatient appropriate because: awaiting safe disposition/placement   Planned Discharge Destination: Rehab    Time spent: 35 minutes  Author: Baron Hamper , MD 11/04/2023 2:45 PM  For on call review [email or web address].

## 2023-11-04 NOTE — Progress Notes (Signed)
 PT requested to stand up at the side of the bed however was able to stand even with 5 other staff members attempting to help. Pt began to become verbally abusive to staff and husband that is bedside. Pt was then cleaned up and helped back to the bed.

## 2023-11-04 NOTE — TOC Progression Note (Signed)
 Transition of Care [facility]) - Progression Note    Patient Details  Name: Melanie Spencer MRN: [id number] Date of Birth: [birth year]  Transition of Care [facility]) CM/SW Contact  Isabella Bowens, [place] Phone Number: 11/04/2023, 2:57 PM  Clinical Narrative:     Patient insurance requested for a new updated PT notes since the last note on 4/9 stated that patient was not cooperating. PT updated as well as PNC. TOC to follow  Expected Discharge Plan: Skilled Nursing Facility Barriers to Discharge: Insurance Authorization  Expected Discharge Plan and Services       Living arrangements for the past 2 months: Single Family Home                                       Social Determinants of Health (SDOH) Interventions SDOH Screenings   Food Insecurity: No Food Insecurity (10/29/2023)  Housing: Low Risk  (10/29/2023)  Transportation Needs: No Transportation Needs (10/29/2023)  Utilities: Not At Risk (10/29/2023)  Alcohol Screen: Low Risk  (07/13/2021)  Depression (PHQ2-9): Low Risk  (07/13/2021)  Financial Resource Strain: Low Risk  (11/23/2022)   Received [facility], [facility]  Physical Activity: Inactive (07/14/2023)   Received from [facility]  Social Connections: Moderately Integrated (07/14/2023)   Received from [facility]  Stress: No Stress Concern Present (07/14/2023)   Received from [facility]  Tobacco Use: Medium Risk (10/31/2023)   Received from [facility] Literacy: Low Risk  (07/14/2023)   Received from [facility]    Readmission Risk Interventions    11/04/2023    2:57 PM 10/16/2021    3:21 PM  Readmission Risk Prevention Plan  Post Dischage Appt  Complete  Medication Screening Complete Complete  Transportation Screening Complete Complete

## 2023-11-05 DIAGNOSIS — G9341 Metabolic encephalopathy: Secondary | ICD-10-CM | POA: Diagnosis not present

## 2023-11-05 LAB — CBC
HCT: 28.1 % — ABNORMAL LOW (ref 36.0–46.0)
Hemoglobin: 8.4 g/dL — ABNORMAL LOW (ref 12.0–15.0)
MCH: 24.9 pg — ABNORMAL LOW (ref 26.0–34.0)
MCHC: 29.9 g/dL — ABNORMAL LOW (ref 30.0–36.0)
MCV: 83.4 fL (ref 80.0–100.0)
Platelets: 220 10*3/uL (ref 150–400)
RBC: 3.37 MIL/uL — ABNORMAL LOW (ref 3.87–5.11)
RDW: 15.5 % (ref 11.5–15.5)
WBC: 9.5 10*3/uL (ref 4.0–10.5)
nRBC: 0.2 % (ref 0.0–0.2)

## 2023-11-05 LAB — GLUCOSE, CAPILLARY
Glucose-Capillary: 157 mg/dL — ABNORMAL HIGH (ref 70–99)
Glucose-Capillary: 139 mg/dL — ABNORMAL HIGH (ref 70–99)
Glucose-Capillary: 97 mg/dL (ref 70–99)
Glucose-Capillary: 64 mg/dL — ABNORMAL LOW (ref 70–99)
Glucose-Capillary: 57 mg/dL — ABNORMAL LOW (ref 70–99)
Glucose-Capillary: 85 mg/dL (ref 70–99)

## 2023-11-05 LAB — BASIC METABOLIC PANEL WITH GFR
Anion gap: 11 (ref 5–15)
BUN: 33 mg/dL — ABNORMAL HIGH (ref 8–23)
CO2: 26 mmol/L (ref 22–32)
Calcium: 8 mg/dL — ABNORMAL LOW (ref 8.9–10.3)
Chloride: 105 mmol/L (ref 98–111)
Creatinine, Ser: 1.51 mg/dL — ABNORMAL HIGH (ref 0.44–1.00)
GFR, Estimated: 39 mL/min — ABNORMAL LOW (ref >60)
Glucose, Bld: 136 mg/dL — ABNORMAL HIGH (ref 70–99)
Potassium: 3.4 mmol/L — ABNORMAL LOW (ref 3.5–5.1)
Sodium: 142 mmol/L (ref 135–145)

## 2023-11-05 LAB — MAGNESIUM: Magnesium: 1.5 mg/dL — ABNORMAL LOW (ref 1.7–2.4)

## 2023-11-05 MED ORDER — PREGABALIN 50 MG PO CAPS
50.0000 mg | ORAL_CAPSULE | Freq: Two times a day (BID) | ORAL | Status: DC
  Administered 2023-11-05 – 2023-11-08 (×7): 50 mg via ORAL
  Filled 2023-11-05 (×7): qty 1

## 2023-11-05 MED ORDER — KETOROLAC TROMETHAMINE 15 MG/ML IJ SOLN
7.5000 mg | Freq: Once | INTRAMUSCULAR | Status: AC
  Administered 2023-11-05: 7.5 mg via INTRAMUSCULAR
  Filled 2023-11-05: qty 1

## 2023-11-05 MED ORDER — POTASSIUM CHLORIDE 20 MEQ PO PACK
40.0000 meq | PACK | Freq: Once | ORAL | Status: AC
  Administered 2023-11-05: 40 meq via ORAL
  Filled 2023-11-05: qty 2

## 2023-11-05 MED ORDER — KETOROLAC TROMETHAMINE 15 MG/ML IJ SOLN: 7.5000 mg | Freq: Once | INTRAMUSCULAR | Status: DC

## 2023-11-05 MED ORDER — GLUCOSE 40 % PO GEL
ORAL | Status: AC
  Filled 2023-11-05: qty 1.21

## 2023-11-05 MED ORDER — GERHARDT'S BUTT CREAM
TOPICAL_CREAM | Freq: Every day | CUTANEOUS | Status: DC
  Administered 2023-11-08: 1 via TOPICAL
  Filled 2023-11-05: qty 60

## 2023-11-05 MED ORDER — MAGNESIUM OXIDE -MG SUPPLEMENT 400 (240 MG) MG PO TABS
400.0000 mg | ORAL_TABLET | Freq: Every day | ORAL | Status: DC
  Administered 2023-11-05: 400 mg via ORAL
  Filled 2023-11-05: qty 1

## 2023-11-05 MED ORDER — GLUCOSE 40 % PO GEL: 1.0000 | ORAL | Status: DC

## 2023-11-05 NOTE — Progress Notes (Signed)
 Progress Note   Patient: Melanie Spencer VOZ:[id number] DOB: [birth year] DOA: 10/29/2023     7 DOS: the patient was seen and examined on 11/05/2023   Brief hospital course: 63 year old female with a history of hypertension, hyperlipidemia, diabetes mellitus type 2, stroke with residual left hemiparesis presenting with generalized weakness and confusion that began on 10/25/2023.  The patient has underlying cognitive impairment.  History is obtained from speaking with the patient and spouse at the bedside.  At baseline, the patient is able to ambulate with a cane and standby assist.  However spouse has noted that the patient has had increasing weakness as the week has progressed.  On the evening of 10/28/2023, the patient was not able to get out of bed because of worsening weakness.  Spouse has also endorsed increasing somnolence.  At baseline, the patient speaks fluently, but is pleasantly confused and A&O x 1.  Spouse states that she has been started on Lyrica about a week prior to this admission.  She was previously taking gabapentin.  There is no other new medications. There is been no complaints of chest pain, shortness breath, coughing, hemoptysis, vomiting, diarrhea, abdominal pain.  Oral intake has been decreased this week.  Patient has approximately 20-pack-year history of tobacco after quitting smoking in 2007. In the ED, the patient was afebrile and hemodynamically stable with oxygen saturation 94% room air.  WBC 10.9, hemoglobin 10.1, platelets 233.  Sodium 138, potassium 4.2, bicarbonate 20, serum creatinine 1.59.  Lactic acid 3.7.  UA >50 WBC, >50 RBC.  The patient was started on cefepime.  She was admitted for further evaluation and treatment.  She became hypotension and she was started on levophed.  Her antibiotic coverage was broadened with zyvox and merrem.  She was weaned off of levophed.  Assessment and Plan: Acute metabolic encephalopathy due to severe sepsis due to UTI  - Antibx course  complete    Acute resp failure with hypoxia  - Resolved    Acute on chronic CKD3a  - Monitor    DM2 w/ hyperglycemia  - Novolog SS tid and at bedtime  - Novolog 5 units sq tid - Semglee 35 units sq daily    HTN  - Norvasc 10 mg PO daily  - Labetalol 200 mg PO bid    HLD - Zocor 40 mg PO at bedtime    Hx of stroke  - ASA 81 mg PO daily  - Zocor 40 mg PO at bedtime    Morbid obesity  - BMI 43.14 kg/m2; complicating care    Severe debility  - Awaiting SNF; complicating care   Subjective: Pt seen and examined at the bedside. Awaiting SNF. Advised pt's spouse to ensure that the pt is co-operative w/ PT/OT.  Physical Exam: Vitals:   11/04/23 1259 11/04/23 2103 11/05/23 0411 11/05/23 0830  BP: (!) 146/59 (!) 151/51 (!) 153/53 (!) 176/67  Pulse: 73 75 75 67  Resp:   20 20  Temp: 98.9 F (37.2 C) 99.2 F (37.3 C) 98.7 F (37.1 C)   TempSrc: Oral Oral Oral   SpO2: 98% 92% 94%   Weight:      Height:       HENT:     Head: Normocephalic.     Mouth/Throat:     Mouth: Mucous membranes are moist.  Cardiovascular:     Rate and Rhythm: Normal rate.  Pulmonary:     Effort: Pulmonary effort is normal.  Abdominal:     Palpations: Abdomen  is soft.  Skin:    General: Skin is warm.  Neurological:     Mental Status: She is alert. Mental status is at baseline.  Psychiatric:        Mood and Affect: Mood normal.      Disposition: Status is: Inpatient Remains inpatient appropriate because: awaiting SNF  Planned Discharge Destination: Skilled nursing facility    Time spent: 35 minutes  Author: Aziyah Provencal , MD 11/05/2023 11:38 AM  For on call review [email or web address].

## 2023-11-05 NOTE — Plan of Care (Signed)
   Problem: Education: Goal: Knowledge of General Education information will improve Description: Including pain rating scale, medication(s)/side effects and non-pharmacologic comfort measures Outcome: Progressing   Problem: Clinical Measurements: Goal: Diagnostic test results will improve Outcome: Progressing   Problem: Activity: Goal: Risk for activity intolerance will decrease Outcome: Progressing

## 2023-11-05 NOTE — Progress Notes (Signed)
 Pt refused oral soda and glucose gel. No IV in place. After some education by this care RN and family at bedside. Pt willing to drink soda at 2138

## 2023-11-06 DIAGNOSIS — G9341 Metabolic encephalopathy: Secondary | ICD-10-CM | POA: Diagnosis not present

## 2023-11-06 LAB — BASIC METABOLIC PANEL WITH GFR
Anion gap: 10 (ref 5–15)
BUN: 26 mg/dL — ABNORMAL HIGH (ref 8–23)
CO2: 25 mmol/L (ref 22–32)
Calcium: 8 mg/dL — ABNORMAL LOW (ref 8.9–10.3)
Chloride: 107 mmol/L (ref 98–111)
Creatinine, Ser: 1.3 mg/dL — ABNORMAL HIGH (ref 0.44–1.00)
GFR, Estimated: 46 mL/min — ABNORMAL LOW (ref >60)
Glucose, Bld: 92 mg/dL (ref 70–99)
Potassium: 3.8 mmol/L (ref 3.5–5.1)
Sodium: 142 mmol/L (ref 135–145)

## 2023-11-06 LAB — GLUCOSE, CAPILLARY
Glucose-Capillary: 98 mg/dL (ref 70–99)
Glucose-Capillary: 102 mg/dL — ABNORMAL HIGH (ref 70–99)
Glucose-Capillary: 231 mg/dL — ABNORMAL HIGH (ref 70–99)
Glucose-Capillary: 332 mg/dL — ABNORMAL HIGH (ref 70–99)
Glucose-Capillary: 177 mg/dL — ABNORMAL HIGH (ref 70–99)

## 2023-11-06 LAB — CBC
HCT: 26.6 % — ABNORMAL LOW (ref 36.0–46.0)
Hemoglobin: 8 g/dL — ABNORMAL LOW (ref 12.0–15.0)
MCH: 25 pg — ABNORMAL LOW (ref 26.0–34.0)
MCHC: 30.1 g/dL (ref 30.0–36.0)
MCV: 83.1 fL (ref 80.0–100.0)
Platelets: 263 10*3/uL (ref 150–400)
RBC: 3.2 MIL/uL — ABNORMAL LOW (ref 3.87–5.11)
RDW: 15.6 % — ABNORMAL HIGH (ref 11.5–15.5)
WBC: 8.5 10*3/uL (ref 4.0–10.5)
nRBC: 0 % (ref 0.0–0.2)

## 2023-11-06 LAB — MAGNESIUM: Magnesium: 1.6 mg/dL — ABNORMAL LOW (ref 1.7–2.4)

## 2023-11-06 MED ORDER — MAGNESIUM OXIDE -MG SUPPLEMENT 400 (240 MG) MG PO TABS
400.0000 mg | ORAL_TABLET | Freq: Two times a day (BID) | ORAL | Status: DC
  Administered 2023-11-06 – 2023-11-07 (×3): 400 mg via ORAL
  Filled 2023-11-06 (×3): qty 1

## 2023-11-06 NOTE — Plan of Care (Signed)
  Problem: Education: Goal: Knowledge of General Education information will improve Description: Including pain rating scale, medication(s)/side effects and non-pharmacologic comfort measures Outcome: Progressing   Problem: Clinical Measurements: Goal: Ability to maintain clinical measurements within normal limits will improve Outcome: Progressing   Problem: Activity: Goal: Risk for activity intolerance will decrease Outcome: Progressing   Problem: Nutrition: Goal: Adequate nutrition will be maintained Outcome: Progressing   Problem: Pain Managment: Goal: General experience of comfort will improve and/or be controlled Outcome: Progressing

## 2023-11-06 NOTE — Plan of Care (Signed)
 Pt had a hypoglycemic even, see provider note. Provider d/c'ed sch meal coverage insulin and long acting. Leaving only SSI in place for now. 3AM CBG started.   Pt refused turns stating she is comfortable. Encouraging bed mobility.  Problem: Education: Goal: Knowledge of General Education information will improve Description: Including pain rating scale, medication(s)/side effects and non-pharmacologic comfort measures Outcome: Progressing   Problem: Clinical Measurements: Goal: Ability to maintain clinical measurements within normal limits will improve Outcome: Progressing Goal: Diagnostic test results will improve Outcome: Progressing Goal: Respiratory complications will improve Outcome: Progressing   Problem: Activity: Goal: Risk for activity intolerance will decrease Outcome: Progressing   Problem: Coping: Goal: Level of anxiety will decrease Outcome: Progressing

## 2023-11-06 NOTE — Progress Notes (Signed)
 Blood sugars-64> 57> 85. Questionable oral intake. Recheck CBG. If low will start Dextrose fluids. D/c long acting insulin 35u and Novolog 5u TID for now. Continue SSI.   E.Breanna Mcdaniel, MD. TRH. 1:25 AM 11/06/23.

## 2023-11-06 NOTE — Progress Notes (Signed)
 Progress Note   Patient: Melanie Spencer ZOX:[id number] DOB: [birth year] DOA: 10/29/2023     8 DOS: the patient was seen and examined on 11/06/2023   Brief hospital course: 63 year old female with a history of hypertension, hyperlipidemia, diabetes mellitus type 2, stroke with residual left hemiparesis presenting with generalized weakness and confusion that began on 10/25/2023.  The patient has underlying cognitive impairment.  History is obtained from speaking with the patient and spouse at the bedside.  At baseline, the patient is able to ambulate with a cane and standby assist.  However spouse has noted that the patient has had increasing weakness as the week has progressed.  On the evening of 10/28/2023, the patient was not able to get out of bed because of worsening weakness.  Spouse has also endorsed increasing somnolence.  At baseline, the patient speaks fluently, but is pleasantly confused and A&O x 1.  Spouse states that she has been started on Lyrica about a week prior to this admission.  She was previously taking gabapentin.  There is no other new medications. There is been no complaints of chest pain, shortness breath, coughing, hemoptysis, vomiting, diarrhea, abdominal pain.  Oral intake has been decreased this week.  Patient has approximately 20-pack-year history of tobacco after quitting smoking in 2007. In the ED, the patient was afebrile and hemodynamically stable with oxygen saturation 94% room air.  WBC 10.9, hemoglobin 10.1, platelets 233.  Sodium 138, potassium 4.2, bicarbonate 20, serum creatinine 1.59.  Lactic acid 3.7.  UA >50 WBC, >50 RBC.  The patient was started on cefepime.  She was admitted for further evaluation and treatment.  She became hypotension and she was started on levophed.  Her antibiotic coverage was broadened with zyvox and merrem.  She was weaned off of levophed.  Assessment and Plan:  Acute metabolic encephalopathy due to severe sepsis due to UTI  - Antibx course  complete    Acute resp failure with hypoxia  - Resolved    Acute on chronic CKD3a  - Monitor    DM2 w/ hyperglycemia  - Novolog SS tid and at bedtime  - Lyrica 50 mg PO bid    HTN  - Norvasc 10 mg PO daily  - Labetalol 200 mg PO bid    HLD - Zocor 40 mg PO at bedtime    Hx of stroke  - ASA 81 mg PO daily  - Zocor 40 mg PO at bedtime    Morbid obesity  - BMI 43.14 kg/m2; complicating care    Severe debility  - Awaiting SNF; complicating care   Subjective: Pt seen and examined at the bedside. Awaiting SNF placement.  Physical Exam: Vitals:   11/05/23 1327 11/05/23 2109 11/06/23 0445 11/06/23 0915  BP: (!) 144/53 (!) 138/57 (!) 146/72 (!) 162/60  Pulse: 63 62 72 79  Resp:  16 16   Temp: 97.7 F (36.5 C) 98.4 F (36.9 C) 99.3 F (37.4 C)   TempSrc: Oral  Oral   SpO2: 98% 99% 93%   Weight:      Height:       HENT:     Head: Normocephalic.     Mouth/Throat:     Mouth: Mucous membranes are moist.  Cardiovascular:     Rate and Rhythm: Normal rate.  Pulmonary:     Effort: Pulmonary effort is normal.  Abdominal:     Palpations: Abdomen is soft.  Skin:    General: Skin is warm.  Neurological:  Mental Status: She is alert. Mental status is at baseline.  Psychiatric:        Mood and Affect: Mood normal.     Disposition: Status is: Inpatient Remains inpatient appropriate because: awaiting SNF  Planned Discharge Destination: Skilled nursing facility    Time spent: 35 minutes  Author: Kourtney Terriquez , MD 11/06/2023 10:31 AM  For on call review [email or web address].

## 2023-11-07 DIAGNOSIS — G9341 Metabolic encephalopathy: Secondary | ICD-10-CM | POA: Diagnosis not present

## 2023-11-07 DIAGNOSIS — R531 Weakness: Secondary | ICD-10-CM | POA: Diagnosis not present

## 2023-11-07 DIAGNOSIS — N1831 Chronic kidney disease, stage 3a: Secondary | ICD-10-CM | POA: Diagnosis not present

## 2023-11-07 LAB — GLUCOSE, CAPILLARY
Glucose-Capillary: 158 mg/dL — ABNORMAL HIGH (ref 70–99)
Glucose-Capillary: 226 mg/dL — ABNORMAL HIGH (ref 70–99)
Glucose-Capillary: 280 mg/dL — ABNORMAL HIGH (ref 70–99)
Glucose-Capillary: 320 mg/dL — ABNORMAL HIGH (ref 70–99)

## 2023-11-07 LAB — MAGNESIUM: Magnesium: 1.6 mg/dL — ABNORMAL LOW (ref 1.7–2.4)

## 2023-11-07 MED ORDER — AMLODIPINE BESYLATE 10 MG PO TABS
10.0000 mg | ORAL_TABLET | Freq: Every day | ORAL | 1 refills | Status: DC
Start: 2023-11-08 — End: 2023-11-30

## 2023-11-07 MED ORDER — GERHARDT'S BUTT CREAM: 1.0000 | TOPICAL_CREAM | Freq: Every day | CUTANEOUS | 0 refills | Status: AC

## 2023-11-07 MED ORDER — LIDOCAINE 5 % EX PTCH: 1.0000 | MEDICATED_PATCH | CUTANEOUS | 0 refills | Status: DC

## 2023-11-07 MED ORDER — MAGNESIUM OXIDE -MG SUPPLEMENT 400 (240 MG) MG PO TABS
800.0000 mg | ORAL_TABLET | Freq: Two times a day (BID) | ORAL | Status: DC
  Administered 2023-11-07 – 2023-11-08 (×2): 800 mg via ORAL
  Filled 2023-11-07 (×2): qty 2

## 2023-11-07 MED ORDER — MAGNESIUM OXIDE -MG SUPPLEMENT 400 (240 MG) MG PO TABS: 400.0000 mg | ORAL_TABLET | Freq: Two times a day (BID) | ORAL | 2 refills | Status: DC

## 2023-11-07 MED ORDER — MAGNESIUM SULFATE 2 GM/50ML IV SOLN: 2.0000 g | Freq: Once | INTRAVENOUS | Status: DC

## 2023-11-07 MED ORDER — CYANOCOBALAMIN 500 MCG PO TABS
500.0000 ug | ORAL_TABLET | Freq: Every day | ORAL | 0 refills | Status: DC
Start: 2023-11-08 — End: 2023-12-03

## 2023-11-07 MED ORDER — LABETALOL HCL 200 MG PO TABS
200.0000 mg | ORAL_TABLET | Freq: Two times a day (BID) | ORAL | 1 refills | Status: DC
Start: 2023-11-07 — End: 2023-11-30

## 2023-11-07 NOTE — Plan of Care (Signed)
 ?  Problem: Clinical Measurements: ?Goal: Will remain free from infection ?Outcome: Progressing ?  ?

## 2023-11-07 NOTE — Progress Notes (Signed)
 Physical Therapy Treatment Patient Details Name: Melanie Spencer MRN: [id number] DOB: [birth year] Today's Date: 11/07/2023   History of Present Illness Melanie Spencer is a 63 year old female with a history of hypertension, hyperlipidemia, diabetes mellitus type 2, stroke with residual left hemiparesis presenting with generalized weakness and confusion that began on 10/25/2023.  The patient has underlying cognitive impairment.  History is obtained from speaking with the patient and spouse at the bedside.  At baseline, the patient is able to ambulate with a cane and standby assist.  However spouse has noted that the patient has had increasing weakness as the week has progressed.  On the evening of 10/28/2023, the patient was not able to get out of bed because of worsening weakness.  Spouse has also endorsed increasing somnolence.  At baseline, the patient speaks fluently, but is pleasantly confused and A&O x 1.  Spouse states that she has been started on Lyrica about a week prior to this admission.  She was previously taking gabapentin.  There is no other new medications.    PT Comments  Patient present seated at bedside with her spouse present.  Patient agreeable and motivated for therapy.  Patient requires Max assist for partially lifting bottom off bed, unable to maintain standing balance due to BLE weakness with frequent sliding of LLE forward, slow labored movement for scooting laterally at bedside and requires frequent rest breaks due to fatigue.  Patient tolerated sitting up at bedside after therapy with her spouse present. Patient will benefit from continued skilled physical therapy in hospital and recommended venue below to increase strength, balance, endurance for safe ADLs and gait.     If plan is discharge home, recommend the following: A lot of help with bathing/dressing/bathroom;A lot of help with walking and/or transfers;Help with stairs or ramp for entrance;Assistance with cooking/housework   Can  travel by private vehicle     No  Equipment Recommendations  None recommended by PT    Recommendations for Other Services       Precautions / Restrictions Precautions Precautions: Fall Restrictions Weight Bearing Restrictions Per Provider Order: No     Mobility  Bed Mobility               General bed mobility comments: Patient presents seated at EOB with spouse present    Transfers Overall transfer level: Needs assistance Equipment used: 1 person hand held assist Transfers: Sit to/from Stand Sit to Stand: Max assist           General transfer comment: Max assist for partial sit to stand, able lift bottom off bed leaning on armrest of chair with RUE    Ambulation/Gait                   Stairs             Wheelchair Mobility     Tilt Bed    Modified Rankin (Stroke Patients Only)       Balance Overall balance assessment: Needs assistance Sitting-balance support: Feet supported, No upper extremity supported Sitting balance-Leahy Scale: Fair Sitting balance - Comments: seated at EOB   Standing balance support: During functional activity, Single extremity supported Standing balance-Leahy Scale: Poor Standing balance comment: leaning on armrest of chair with RUE                            Communication Communication Communication: No apparent difficulties  Cognition Arousal: Alert Behavior During Therapy: [facility]  for tasks assessed/performed   PT - Cognitive impairments: No apparent impairments                         Following commands: Intact      Cueing Cueing Techniques: Verbal cues, Tactile cues  Exercises General Exercises - Lower Extremity Long Arc Quad: Seated, AROM, Strengthening, Both, 10 reps Hip Flexion/Marching: Seated, AROM, Strengthening, Both, 10 reps Toe Raises: Seated, AROM, Strengthening, Both, 10 reps Heel Raises: Seated, AROM, Strengthening, Both, 10 reps    General Comments         Pertinent Vitals/Pain Pain Assessment Pain Assessment: No/denies pain    Home Living                          Prior Function            PT Goals (current goals can now be found in the care plan section) Acute Rehab PT Goals Patient Stated Goal: return home PT Goal Formulation: With patient/family Time For Goal Achievement: 11/16/23 Potential to Achieve Goals: Good Progress towards PT goals: Progressing toward goals    Frequency    Min 3X/week      PT Plan      Co-evaluation              AM-PAC PT "6 Clicks" Mobility   Outcome Measure  Help needed turning from your back to your side while in a flat bed without using bedrails?: A Lot Help needed moving from lying on your back to sitting on the side of a flat bed without using bedrails?: A Lot Help needed moving to and from a bed to a chair (including a wheelchair)?: A Lot Help needed standing up from a chair using your arms (e.g., wheelchair or bedside chair)?: A Lot Help needed to walk in hospital room?: Total Help needed climbing 3-5 steps with a railing? : Total 6 Click Score: 10    End of Session   Activity Tolerance: Patient tolerated treatment well;Patient limited by fatigue Patient left: in bed;with call bell/phone within reach;with family/visitor present Nurse Communication: Mobility status PT Visit Diagnosis: Unsteadiness on feet (R26.81);Other abnormalities of gait and mobility (R26.89);Muscle weakness (generalized) (M62.81)     Time: 2841-3244 PT Time Calculation (min) (ACUTE ONLY): 26 min  Charges:    $Therapeutic Exercise: 8-22 mins $Therapeutic Activity: 8-22 mins PT General Charges $$ ACUTE PT VISIT: 1 Visit                     11:32 AM, 11/07/23 Walton Guppy, MPT Physical Therapist with [facility] [phone] office [phone] mobile phone

## 2023-11-07 NOTE — Discharge Summary (Signed)
 Physician Discharge Summary   Patient: Melanie Spencer MRN: [id number] DOB: October 13, 1960  Admit date:     10/29/2023  Discharge date: 11/07/23  Discharge Physician: Kendell Bane   PCP: No primary care provider on file.   Recommendations at discharge:    Follow with the PCP in 1 week Strict diabetic diet Continue monitoring blood sugar closely Current medication subject to change by PCP CBC CMP in 1 week results to PCP  Discharge Diagnoses: Active Problems:   Morbid obesity (HCC)   Acute metabolic encephalopathy   UTI (urinary tract infection)   Sepsis due to undetermined organism (HCC)   Chronic kidney disease, stage 3a (HCC)   Generalized weakness  Resolved Problems:   * No resolved hospital problems. *  Hospital Course: 63 year old female with a history of hypertension, hyperlipidemia, diabetes mellitus type 2, stroke with residual left hemiparesis presenting with generalized weakness and confusion that began on 10/25/2023.  The patient has underlying cognitive impairment.  History is obtained from speaking with the patient and spouse at the bedside.  At baseline, the patient is able to ambulate with a cane and standby assist.  However spouse has noted that the patient has had increasing weakness as the week has progressed.  On the evening of 10/28/2023, the patient was not able to get out of bed because of worsening weakness.  Spouse has also endorsed increasing somnolence.  At baseline, the patient speaks fluently, but is pleasantly confused and A&O x 1.  Spouse states that she has been started on Lyrica about a week prior to this admission.  She was previously taking gabapentin.  There is no other new medications. There is been no complaints of chest pain, shortness breath, coughing, hemoptysis, vomiting, diarrhea, abdominal pain.  Oral intake has been decreased this week.  Patient has approximately 20-pack-year history of tobacco after quitting smoking in 2007. In the ED, the patient  was afebrile and hemodynamically stable with oxygen saturation 94% room air.  WBC 10.9, hemoglobin 10.1, platelets 233.  Sodium 138, potassium 4.2, bicarbonate 20, serum creatinine 1.59.  Lactic acid 3.7.  UA >50 WBC, >50 RBC.  The patient was started on cefepime.  She was admitted for further evaluation and treatment.  She became hypotension and she was started on levophed.  Her antibiotic coverage was broadened with zyvox and merrem.  She was weaned off of levophed.   Acute metabolic encephalopathy due to severe sepsis due to UTI  - Antibx course complete    Acute resp failure with hypoxia  - Resolved    Acute on chronic CKD3a  - Monitor -avoid nephrotoxins including high-dose NSAIDs   DM2 w/ hyperglycemia  - Novolog SS tid and at bedtime  -Resume home regiment with exception of metformin due to acute on CKD - Lyrica 50 mg PO bid    HTN  - Norvasc 10 mg PO daily  - Labetalol 200 mg PO bid    HLD - Zocor 40 mg PO at bedtime    Hx of stroke  - ASA 81 mg PO daily  - Zocor 40 mg PO at bedtime    Morbid obesity  - BMI 43.14 kg/m2; complicating care  Is close follow-up with PCP outpatient, with weight loss regimen, candidate for surgical invention versus medications---    Severe debility  With dense left-sided weakness due previous stroke - Awaiting SNF;   Disposition: Skilled nursing facility Diet recommendation:  Discharge Diet Orders (From admission, onward)     Start  Ordered   11/07/23 0000  Diet - low sodium heart healthy        11/07/23 1041           Cardiac and Carb modified diet DISCHARGE MEDICATION: Allergies as of 11/07/2023   No Known Allergies      Medication List     STOP taking these medications    metFORMIN 1000 MG tablet Commonly known as: GLUCOPHAGE   naproxen sodium 220 MG tablet Commonly known as: ALEVE   ramipril 10 MG capsule Commonly known as: ALTACE       TAKE these medications    Accu-Chek Guide test strip Generic  drug: glucose blood TEST FOUR TIMES A DAY   amLODipine 10 MG tablet Commonly known as: NORVASC Take 1 tablet (10 mg total) by mouth daily. Start taking on: November 08, 2023   aspirin 325 MG tablet Take 325 mg by mouth daily.   cholecalciferol 1000 units tablet Commonly known as: VITAMIN D Take 1,000 Units by mouth daily.   cyanocobalamin 500 MCG tablet Commonly known as: VITAMIN B12 Take 1 tablet (500 mcg total) by mouth daily. Start taking on: November 08, 2023   fesoterodine 8 MG Tb24 tablet Commonly known as: TOVIAZ Take 1 tablet (8 mg total) by mouth daily. What changed:  how much to take when to take this   fluticasone 50 MCG/ACT nasal spray Commonly known as: FLONASE SHAKE LIQUID AND USE 2 SPRAYS IN EACH NOSTRIL EVERY DAY What changed: See the new instructions.   Gerhardt's butt cream Crea Apply 1 Application topically daily. Start taking on: November 08, 2023   Insulin Pen Needle 29G X 12.7MM Misc 1 Units by Does not apply route daily at 12 noon.   labetalol 200 MG tablet Commonly known as: NORMODYNE Take 1 tablet (200 mg total) by mouth 2 (two) times daily.   Lantus SoloStar 100 UNIT/ML Solostar Pen Generic drug: insulin glargine ADMINISTER 40 UNITS UNDER THE SKIN AT BEDTIME What changed:  how much to take how to take this when to take this additional instructions   lidocaine 5 % Commonly known as: LIDODERM Place 1 patch onto the skin daily. Remove & Discard patch within 12 hours or as directed by MD   magnesium oxide 400 (240 Mg) MG tablet Commonly known as: MAG-OX Take 1 tablet (400 mg total) by mouth 2 (two) times daily.   omeprazole 20 MG capsule Commonly known as: PRILOSEC Take 1 capsule by mouth daily.   pregabalin 50 MG capsule Commonly known as: LYRICA Take 50 mg by mouth 2 (two) times daily.   simvastatin 40 MG tablet Commonly known as: ZOCOR Take 1 tablet by mouth at bedtime.   Trulicity 4.5 MG/0.5ML Soaj Generic drug:  Dulaglutide Inject 4.5 mg as directed once a week. What changed: additional instructions        Contact information for after-discharge care     Destination     [facility] .   Service: Skilled Nursing Contact information: [address] [place]  [address] [phone]                    Discharge Exam: Cleavon Curls Weights   10/29/23 0248 11/02/23 0610  Weight: 113.4 kg 117.6 kg        General:  AAO x 3,  cooperative, no distress;   HEENT:  Normocephalic, PERRL, otherwise with in Normal limits   Neuro:  Dense left-sided weakness CNII-XII intact. , normal motor and  sensation, reflexes intact   Lungs:   Clear to auscultation BL, Respirations unlabored,  No wheezes / crackles  Cardio:    S1/S2, RRR, No murmure, No Rubs or Gallops   Abdomen:  soft, non-tender, bowel sounds active all four quadrants, no guarding or peritoneal signs.  Muscular  skeletal:  Left-sided weakness, gait instability  Limited exam -global generalized weaknesses - in bed, able to move all 4 extremities,   2+ pulses,  symmetric, No pitting edema  Skin:  Dry, warm to touch, negative for any Rashes,  Wounds: Please see nursing documentation          Condition at discharge: fair  The results of significant diagnostics from this hospitalization (including imaging, microbiology, ancillary and laboratory) are listed below for reference.   Imaging Studies: US RENAL Result Date: 10/30/2023 CLINICAL DATA:  Acute kidney injury EXAM: RENAL / URINARY TRACT ULTRASOUND COMPLETE COMPARISON:  Renal stone protocol CT 08/04/2022 FINDINGS: Right Kidney: Renal measurements: 12.0 x 5.8 x 4.9 cm = volume: 120 mL. Echogenicity within normal limits. No mass or hydronephrosis visualized. Trace right perinephric fluid, of doubtful clinical significance. Left Kidney: Renal measurements: 12.8 x 5.8 x 5.4 cm = volume: 209 mL. Echogenicity within normal limits. No mass or  hydronephrosis visualized. Bladder: Unable to evaluate due to collapse configuration. Other: None. IMPRESSION: No significant sonographic abnormality of the kidneys. Electronically Signed   By: Acquanetta Belling M.D.   On: 10/30/2023 18:38   DG CHEST PORT 1 VIEW Result Date: 10/30/2023 CLINICAL DATA:  Shortness of breath.  Weakness. EXAM: PORTABLE CHEST 1 VIEW COMPARISON:  Chest pain and dated 10/29/2023. FINDINGS: There is mild cardiomegaly and mild vascular congestion. No focal consolidation, pleural effusion or pneumothorax. Acute osseous pathology. IMPRESSION: Mild cardiomegaly and mild vascular congestion. Electronically Signed   By: Elgie Collard M.D.   On: 10/30/2023 16:24   DG Chest Port 1 View Result Date: 10/29/2023 CLINICAL DATA:  Shortness of breath EXAM: PORTABLE CHEST 1 VIEW COMPARISON:  None Available. FINDINGS: Mild cardiomegaly with mild central vascular congestion. No focal consolidation, pleural effusion, pneumothorax. Osseous pathology. IMPRESSION: Mild cardiomegaly with mild central vascular congestion. Electronically Signed   By: Elgie Collard M.D.   On: 10/29/2023 13:54    Microbiology: Results for orders placed or performed during the hospital encounter of 10/29/23  Blood culture (routine x 2)     Status: None   Collection Time: 10/29/23  4:34 AM   Specimen: Left Antecubital; Blood  Result Value Ref Range Status   Specimen Description LEFT ANTECUBITAL  Final   Special Requests   Final    BOTTLES DRAWN AEROBIC AND ANAEROBIC Blood Culture adequate volume   Culture   Final    NO GROWTH 5 DAYS Performed at [facility], [address], [place], [place] [address]    Report Status 11/03/2023 FINAL  Final  Blood culture (routine x 2)     Status: None   Collection Time: 10/29/23  4:56 AM   Specimen: BLOOD RIGHT HAND  Result Value Ref Range Status   Specimen Description BLOOD RIGHT HAND  Final   Special Requests   Final    BOTTLES DRAWN AEROBIC ONLY Blood Culture adequate  volume   Culture   Final    NO GROWTH 5 DAYS Performed at [facility], [address], [place], [place] [address]    Report Status 11/03/2023 FINAL  Final  Resp panel by RT-PCR (RSV, Flu A&B, Covid) Anterior Nasal Swab     Status:  None   Collection Time: 10/29/23  5:58 AM   Specimen: Anterior Nasal Swab  Result Value Ref Range Status   SARS Coronavirus 2 by RT PCR NEGATIVE NEGATIVE Final    Comment: (NOTE) SARS-CoV-2 target nucleic acids are NOT DETECTED.  The SARS-CoV-2 RNA is generally detectable in upper respiratory specimens during the acute phase of infection. The lowest concentration of SARS-CoV-2 viral copies this assay can detect is 138 copies/mL. A negative result does not preclude SARS-Cov-2 infection and should not be used as the sole basis for treatment or other patient management decisions. A negative result may occur with  improper specimen collection/handling, submission of specimen other than nasopharyngeal swab, presence of viral mutation(s) within the areas targeted by this assay, and inadequate number of viral copies(<138 copies/mL). A negative result must be combined with clinical observations, patient history, and epidemiological information. The expected result is Negative.  Fact Sheet for Patients:  BloggerCourse.com  Fact Sheet for Healthcare Providers:  SeriousBroker.it  This test is no t yet approved or cleared by the [place]  FDA and  has been authorized for detection and/or diagnosis of SARS-CoV-2 by FDA under an Emergency Use Authorization (EUA). This EUA will remain  in effect (meaning this test can be used) for the duration of the COVID-19 declaration under Section 564(b)(1) of the Act, 21 U.S.C.section 360bbb-3(b)(1), unless the authorization is terminated  or revoked sooner.       Influenza A by PCR NEGATIVE NEGATIVE Final   Influenza B by PCR NEGATIVE NEGATIVE Final    Comment:  (NOTE) The Xpert Xpress SARS-CoV-2/FLU/RSV plus assay is intended as an aid in the diagnosis of influenza from Nasopharyngeal swab specimens and should not be used as a sole basis for treatment. Nasal washings and aspirates are unacceptable for Xpert Xpress SARS-CoV-2/FLU/RSV testing.  Fact Sheet for Patients: BloggerCourse.com  Fact Sheet for Healthcare Providers: SeriousBroker.it  This test is not yet approved or cleared by the [place]  FDA and has been authorized for detection and/or diagnosis of SARS-CoV-2 by FDA under an Emergency Use Authorization (EUA). This EUA will remain in effect (meaning this test can be used) for the duration of the COVID-19 declaration under Section 564(b)(1) of the Act, 21 U.S.C. section 360bbb-3(b)(1), unless the authorization is terminated or revoked.     Resp Syncytial Virus by PCR NEGATIVE NEGATIVE Final    Comment: (NOTE) Fact Sheet for Patients: BloggerCourse.com  Fact Sheet for Healthcare Providers: SeriousBroker.it  This test is not yet approved or cleared by the [place]  FDA and has been authorized for detection and/or diagnosis of SARS-CoV-2 by FDA under an Emergency Use Authorization (EUA). This EUA will remain in effect (meaning this test can be used) for the duration of the COVID-19 declaration under Section 564(b)(1) of the Act, 21 U.S.C. section 360bbb-3(b)(1), unless the authorization is terminated or revoked.  Performed at [facility], [address] [place], [place] [address]   Urine Culture     Status: None   Collection Time: 10/29/23  1:15 PM   Specimen: Urine, Clean Catch  Result Value Ref Range Status   Specimen Description   Final    URINE, CLEAN CATCH Performed at [facility], [address] [place], [place] [address]    Special Requests   Final    NONE Performed at [facility], [address],  [place], [place] [address]    Culture   Final    NO GROWTH Performed at [facility], [address]  [place], [place] [address]    Report Status 10/30/2023 FINAL  Final  MRSA Next Gen by PCR, Nasal     Status: None   Collection Time: 10/29/23  1:21 PM   Specimen: Nasal Mucosa; Nasal Swab  Result Value Ref Range Status   MRSA by PCR Next Gen NOT DETECTED NOT DETECTED Final    Comment: (NOTE) The GeneXpert MRSA Assay (FDA approved for NASAL specimens only), is one component of a comprehensive MRSA colonization surveillance program. It is not intended to diagnose MRSA infection nor to guide or monitor treatment for MRSA infections. Test performance is not FDA approved in patients less than 58 years old. Performed at [facility], [address], [place], [place] [address]   C Difficile Quick Screen w PCR reflex     Status: None   Collection Time: 10/30/23 12:11 PM   Specimen: STOOL  Result Value Ref Range Status   C Diff antigen NEGATIVE NEGATIVE Final   C Diff toxin NEGATIVE NEGATIVE Final   C Diff interpretation No C. difficile detected.  Final    Comment: Performed at [facility], [address], [place], [place] [address]    Labs: CBC: Recent Labs  Lab 11/01/23 0912 11/02/23 0455 11/03/23 0527 11/04/23 0509 11/05/23 0406 11/06/23 0452  WBC 14.2* 8.8 6.8 7.7 9.5 8.5  NEUTROABS 12.4*  --   --   --   --   --   HGB 9.3* 8.4* 9.6* 8.9* 8.4* 8.0*  HCT 30.5* 27.8* 31.2* 28.0* 28.1* 26.6*  MCV 82.2 82.2 81.7 81.4 83.4 83.1  PLT 101* 78* 117* 172 220 263   Basic Metabolic Panel: Recent Labs  Lab 11/02/23 0455 11/03/23 0527 11/04/23 0509 11/05/23 0406 11/06/23 0452 11/07/23 0435  NA 137 140 139 142 142  --   K 3.6 3.6 3.2* 3.4* 3.8  --   CL 104 106 106 105 107  --   CO2 21* 24 24 26 25   --   GLUCOSE 258* 202* 202* 136* 92  --   BUN 62* 53* 40* 33* 26*  --   CREATININE 2.94* 2.30* 1.73* 1.51* 1.30*  --   CALCIUM 7.9* 8.3* 8.0* 8.0* 8.0*  --   MG  --   --   --   1.5* 1.6* 1.6*   Liver Function Tests: Recent Labs  Lab 11/01/23 0912  AST 14*  ALT 27  ALKPHOS 179*  BILITOT 0.6  PROT 6.3*  ALBUMIN 2.0*   CBG: Recent Labs  Lab 11/06/23 0708 11/06/23 1129 11/06/23 1643 11/06/23 1955 11/07/23 0718  GLUCAP 102* 231* 332* 177* 158*    Discharge time spent: greater than 30 minutes.  Signed: Bobbetta Burnet, MD [facility] 11/07/2023

## 2023-11-07 NOTE — Plan of Care (Signed)

## 2023-11-08 DIAGNOSIS — N179 Acute kidney failure, unspecified: Secondary | ICD-10-CM | POA: Diagnosis not present

## 2023-11-08 DIAGNOSIS — R6521 Severe sepsis with septic shock: Secondary | ICD-10-CM | POA: Diagnosis not present

## 2023-11-08 DIAGNOSIS — A419 Sepsis, unspecified organism: Secondary | ICD-10-CM | POA: Diagnosis not present

## 2023-11-08 LAB — GLUCOSE, CAPILLARY
Glucose-Capillary: 224 mg/dL — ABNORMAL HIGH (ref 70–99)
Glucose-Capillary: 226 mg/dL — ABNORMAL HIGH (ref 70–99)

## 2023-11-08 LAB — MAGNESIUM: Magnesium: 1.7 mg/dL (ref 1.7–2.4)

## 2023-11-08 NOTE — Progress Notes (Signed)
 Pt report called to [facility]. Per nursing staff at [facility], OK to bring pt over after lunch between 12;30 - 1:30pm.

## 2023-11-08 NOTE — Care Management Important Message (Signed)
 Important Message  Patient Details  Name: Melanie Spencer MRN: [id number] Date of Birth: 12-08-1960   Important Message Given:  Yes - Medicare IM     Kunal Levario L Shirley Decamp 11/08/2023, 10:36 AM

## 2023-11-08 NOTE — TOC Transition Note (Signed)
 Transition of Care [facility]) - Discharge Note   Patient Details  Name: Melanie Spencer MRN: [id number] Date of Birth: 1961/03/07  Transition of Care [facility]) CM/SW Contact:  Orelia Binet, RN Phone Number: 11/08/2023, 8:58 AM   Clinical Narrative:    Peer to Peer completed - APPROVED, 4/11 - 4/15. nrd 4/15,navi auth id [id number], plan auth id [id number]. Kerri updated and ready for patient, RN to call report. CM spoke with her husband to updated him with DC plan and advise Maudine Sos would be calling soon.     Final next level of care: Skilled Nursing Facility Barriers to Discharge: Insurance Authorization   Patient Goals and CMS Choice Patient states their goals for this hospitalization and ongoing recovery are:: agreeable to SNF CMS Medicare.gov Compare Post Acute Care list provided to:: Patient Represenative (must comment) Choice offered to / list presented to : Spouse     Discharge Placement               Patient to be transferred to facility by: staff Name of family member notified: Husband Patient and family notified of of transfer: 11/08/23  Discharge Plan and Services Additional resources added to the After Visit Summary for        Social Drivers of Health (SDOH) Interventions SDOH Screenings   Food Insecurity: No Food Insecurity (10/29/2023)  Housing: Low Risk  (10/29/2023)  Transportation Needs: No Transportation Needs (10/29/2023)  Utilities: Not At Risk (10/29/2023)  Alcohol Screen: Low Risk  (07/13/2021)  Depression (PHQ2-9): Low Risk  (07/13/2021)  Financial Resource Strain: Low Risk  (11/23/2022)   [facility] from [facility]  Physical Activity: Inactive (07/14/2023)   Received from [facility]  Social Connections: Moderately Integrated (07/14/2023)   Received from [facility]  Stress: No Stress Concern Present (07/14/2023)   Received from [facility]  Tobacco Use: Medium Risk (10/31/2023)   [facility] from [facility] Literacy:  Low Risk  (07/14/2023)   Received from [facility]    Readmission Risk Interventions    11/04/2023    2:57 PM 10/16/2021    3:21 PM  Readmission Risk Prevention Plan  Post Dischage Appt  Complete  Medication Screening Complete Complete  Transportation Screening Complete Complete

## 2023-11-08 NOTE — Discharge Summary (Signed)
 Physician Discharge Summary   Patient: Melanie Spencer MRN: [id number] DOB: [birth year]  Admit date:     10/29/2023  Discharge date: 11/08/23  Discharge Physician: Kendell Bane   PCP: No primary care provider on file.   Patient was seen and examined this morning, stable to be discharged to SNF  No further changes at this discharge summary   Recommendations at discharge:    Follow with the PCP in 1 week Strict diabetic diet Continue monitoring blood sugar closely Current medication subject to change by PCP CBC CMP in 1 week results to PCP  Discharge Diagnoses: Active Problems:   Morbid obesity (HCC)   Acute metabolic encephalopathy   UTI (urinary tract infection)   Sepsis due to undetermined organism (HCC)   Chronic kidney disease, stage 3a (HCC)   Generalized weakness  Resolved Problems:   * No resolved hospital problems. *  Hospital Course: 63 year old female with a history of hypertension, hyperlipidemia, diabetes mellitus type 2, stroke with residual left hemiparesis presenting with generalized weakness and confusion that began on 10/25/2023.  The patient has underlying cognitive impairment.  History is obtained from speaking with the patient and spouse at the bedside.  At baseline, the patient is able to ambulate with a cane and standby assist.  However spouse has noted that the patient has had increasing weakness as the week has progressed.  On the evening of 10/28/2023, the patient was not able to get out of bed because of worsening weakness.  Spouse has also endorsed increasing somnolence.  At baseline, the patient speaks fluently, but is pleasantly confused and A&O x 1.  Spouse states that she has been started on Lyrica about a week prior to this admission.  She was previously taking gabapentin.  There is no other new medications. There is been no complaints of chest pain, shortness breath, coughing, hemoptysis, vomiting, diarrhea, abdominal pain.  Oral intake has been  decreased this week.  Patient has approximately 20-pack-year history of tobacco after quitting smoking in 2007. In the ED, the patient was afebrile and hemodynamically stable with oxygen saturation 94% room air.  WBC 10.9, hemoglobin 10.1, platelets 233.  Sodium 138, potassium 4.2, bicarbonate 20, serum creatinine 1.59.  Lactic acid 3.7.  UA >50 WBC, >50 RBC.  The patient was started on cefepime.  She was admitted for further evaluation and treatment.  She became hypotension and she was started on levophed.  Her antibiotic coverage was broadened with zyvox and merrem.  She was weaned off of levophed.   Acute metabolic encephalopathy due to severe sepsis due to UTI  - Antibx course complete    Acute resp failure with hypoxia  - Resolved    Acute on chronic CKD3a  - Monitor -avoid nephrotoxins including high-dose NSAIDs   DM2 w/ hyperglycemia  - Novolog SS tid and at bedtime  -Resume home regiment with exception of metformin due to acute on CKD - Lyrica 50 mg PO bid    HTN  - Norvasc 10 mg PO daily  - Labetalol 200 mg PO bid    HLD - Zocor 40 mg PO at bedtime    Hx of stroke  - ASA 81 mg PO daily  - Zocor 40 mg PO at bedtime    Morbid obesity  - BMI 43.14 kg/m2; complicating care  Is close follow-up with PCP outpatient, with weight loss regimen, candidate for surgical invention versus medications---    Severe debility  With dense left-sided weakness due previous stroke -  Awaiting SNF;   Disposition: Skilled nursing facility Diet recommendation:  Discharge Diet Orders (From admission, onward)     Start     Ordered   11/07/23 0000  Diet - low sodium heart healthy        11/07/23 1041           Cardiac and Carb modified diet DISCHARGE MEDICATION: Allergies as of 11/08/2023   No Known Allergies      Medication List     STOP taking these medications    metFORMIN 1000 MG tablet Commonly known as: GLUCOPHAGE   naproxen sodium 220 MG tablet Commonly known as:  ALEVE   ramipril 10 MG capsule Commonly known as: ALTACE       TAKE these medications    Accu-Chek Guide test strip Generic drug: glucose blood TEST FOUR TIMES A DAY   amLODipine 10 MG tablet Commonly known as: NORVASC Take 1 tablet (10 mg total) by mouth daily.   aspirin 325 MG tablet Take 325 mg by mouth daily.   cholecalciferol 1000 units tablet Commonly known as: VITAMIN D Take 1,000 Units by mouth daily.   cyanocobalamin 500 MCG tablet Commonly known as: VITAMIN B12 Take 1 tablet (500 mcg total) by mouth daily.   fesoterodine 8 MG Tb24 tablet Commonly known as: TOVIAZ Take 1 tablet (8 mg total) by mouth daily. What changed:  how much to take when to take this   fluticasone 50 MCG/ACT nasal spray Commonly known as: FLONASE SHAKE LIQUID AND USE 2 SPRAYS IN EACH NOSTRIL EVERY DAY What changed: See the new instructions.   Gerhardt's butt cream Crea Apply 1 Application topically daily.   Insulin Pen Needle 29G X 12.7MM Misc 1 Units by Does not apply route daily at 12 noon.   labetalol 200 MG tablet Commonly known as: NORMODYNE Take 1 tablet (200 mg total) by mouth 2 (two) times daily.   Lantus SoloStar 100 UNIT/ML Solostar Pen Generic drug: insulin glargine ADMINISTER 40 UNITS UNDER THE SKIN AT BEDTIME What changed:  how much to take how to take this when to take this additional instructions   lidocaine 5 % Commonly known as: LIDODERM Place 1 patch onto the skin daily. Remove & Discard patch within 12 hours or as directed by MD   magnesium oxide 400 (240 Mg) MG tablet Commonly known as: MAG-OX Take 1 tablet (400 mg total) by mouth 2 (two) times daily.   omeprazole 20 MG capsule Commonly known as: PRILOSEC Take 1 capsule by mouth daily.   pregabalin 50 MG capsule Commonly known as: LYRICA Take 50 mg by mouth 2 (two) times daily.   simvastatin 40 MG tablet Commonly known as: ZOCOR Take 1 tablet by mouth at bedtime.   Trulicity 4.5 MG/0.5ML  Soaj Generic drug: Dulaglutide Inject 4.5 mg as directed once a week. What changed: additional instructions        Contact information for after-discharge care     Destination     [facility] Preferred SNF .   Service: Skilled Nursing Contact information: [address] [place]  [address] [phone]                    Discharge Exam: Cleavon Curls Weights   10/29/23 0248 11/02/23 0610  Weight: 113.4 kg 117.6 kg        General:  AAO x 3,  cooperative, no distress;   HEENT:  Normocephalic, PERRL, otherwise with in Normal limits   Neuro:  Dense  left-sided weakness CNII-XII intact. , normal motor and sensation, reflexes intact   Lungs:   Clear to auscultation BL, Respirations unlabored,  No wheezes / crackles  Cardio:    S1/S2, RRR, No murmure, No Rubs or Gallops   Abdomen:  soft, non-tender, bowel sounds active all four quadrants, no guarding or peritoneal signs.  Muscular  skeletal:  Left-sided weakness, gait instability  Limited exam -global generalized weaknesses - in bed, able to move all 4 extremities,   2+ pulses,  symmetric, No pitting edema  Skin:  Dry, warm to touch, negative for any Rashes,  Wounds: Please see nursing documentation          Condition at discharge: fair  The results of significant diagnostics from this hospitalization (including imaging, microbiology, ancillary and laboratory) are listed below for reference.   Imaging Studies: US RENAL Result Date: 10/30/2023 CLINICAL DATA:  Acute kidney injury EXAM: RENAL / URINARY TRACT ULTRASOUND COMPLETE COMPARISON:  Renal stone protocol CT 08/04/2022 FINDINGS: Right Kidney: Renal measurements: 12.0 x 5.8 x 4.9 cm = volume: 120 mL. Echogenicity within normal limits. No mass or hydronephrosis visualized. Trace right perinephric fluid, of doubtful clinical significance. Left Kidney: Renal measurements: 12.8 x 5.8 x 5.4 cm = volume: 209 mL. Echogenicity within normal  limits. No mass or hydronephrosis visualized. Bladder: Unable to evaluate due to collapse configuration. Other: None. IMPRESSION: No significant sonographic abnormality of the kidneys. Electronically Signed   By: Acquanetta Belling M.D.   On: 10/30/2023 18:38   DG CHEST PORT 1 VIEW Result Date: 10/30/2023 CLINICAL DATA:  Shortness of breath.  Weakness. EXAM: PORTABLE CHEST 1 VIEW COMPARISON:  Chest pain and dated 10/29/2023. FINDINGS: There is mild cardiomegaly and mild vascular congestion. No focal consolidation, pleural effusion or pneumothorax. Acute osseous pathology. IMPRESSION: Mild cardiomegaly and mild vascular congestion. Electronically Signed   By: Elgie Collard M.D.   On: 10/30/2023 16:24   DG Chest Port 1 View Result Date: 10/29/2023 CLINICAL DATA:  Shortness of breath EXAM: PORTABLE CHEST 1 VIEW COMPARISON:  None Available. FINDINGS: Mild cardiomegaly with mild central vascular congestion. No focal consolidation, pleural effusion, pneumothorax. Osseous pathology. IMPRESSION: Mild cardiomegaly with mild central vascular congestion. Electronically Signed   By: Elgie Collard M.D.   On: 10/29/2023 13:54    Microbiology: Results for orders placed or performed during the hospital encounter of 10/29/23  Blood culture (routine x 2)     Status: None   Collection Time: 10/29/23  4:34 AM   Specimen: Left Antecubital; Blood  Result Value Ref Range Status   Specimen Description LEFT ANTECUBITAL  Final   Special Requests   Final    BOTTLES DRAWN AEROBIC AND ANAEROBIC Blood Culture adequate volume   Culture   Final    NO GROWTH 5 DAYS Performed at [facility], [address], [place], [place] [address]    Report Status 11/03/2023 FINAL  Final  Blood culture (routine x 2)     Status: None   Collection Time: 10/29/23  4:56 AM   Specimen: BLOOD RIGHT HAND  Result Value Ref Range Status   Specimen Description BLOOD RIGHT HAND  Final   Special Requests   Final    BOTTLES DRAWN AEROBIC ONLY Blood  Culture adequate volume   Culture   Final    NO GROWTH 5 DAYS Performed at [facility], [address] [place], [place] [address]    Report Status 11/03/2023 FINAL  Final  Resp panel by RT-PCR (RSV, Flu A&B, Covid)  Anterior Nasal Swab     Status: None   Collection Time: 10/29/23  5:58 AM   Specimen: Anterior Nasal Swab  Result Value Ref Range Status   SARS Coronavirus 2 by RT PCR NEGATIVE NEGATIVE Final    Comment: (NOTE) SARS-CoV-2 target nucleic acids are NOT DETECTED.  The SARS-CoV-2 RNA is generally detectable in upper respiratory specimens during the acute phase of infection. The lowest concentration of SARS-CoV-2 viral copies this assay can detect is 138 copies/mL. A negative result does not preclude SARS-Cov-2 infection and should not be used as the sole basis for treatment or other patient management decisions. A negative result may occur with  improper specimen collection/handling, submission of specimen other than nasopharyngeal swab, presence of viral mutation(s) within the areas targeted by this assay, and inadequate number of viral copies(<138 copies/mL). A negative result must be combined with clinical observations, patient history, and epidemiological information. The expected result is Negative.  Fact Sheet for Patients:  BloggerCourse.com  Fact Sheet for Healthcare Providers:  SeriousBroker.it  This test is no t yet approved or cleared by the [place]  FDA and  has been authorized for detection and/or diagnosis of SARS-CoV-2 by FDA under an Emergency Use Authorization (EUA). This EUA will remain  in effect (meaning this test can be used) for the duration of the COVID-19 declaration under Section 564(b)(1) of the Act, 21 U.S.C.section 360bbb-3(b)(1), unless the authorization is terminated  or revoked sooner.       Influenza A by PCR NEGATIVE NEGATIVE Final   Influenza B by PCR NEGATIVE NEGATIVE Final     Comment: (NOTE) The Xpert Xpress SARS-CoV-2/FLU/RSV plus assay is intended as an aid in the diagnosis of influenza from Nasopharyngeal swab specimens and should not be used as a sole basis for treatment. Nasal washings and aspirates are unacceptable for Xpert Xpress SARS-CoV-2/FLU/RSV testing.  Fact Sheet for Patients: BloggerCourse.com  Fact Sheet for Healthcare Providers: SeriousBroker.it  This test is not yet approved or cleared by the [place]  FDA and has been authorized for detection and/or diagnosis of SARS-CoV-2 by FDA under an Emergency Use Authorization (EUA). This EUA will remain in effect (meaning this test can be used) for the duration of the COVID-19 declaration under Section 564(b)(1) of the Act, 21 U.S.C. section 360bbb-3(b)(1), unless the authorization is terminated or revoked.     Resp Syncytial Virus by PCR NEGATIVE NEGATIVE Final    Comment: (NOTE) Fact Sheet for Patients: BloggerCourse.com  Fact Sheet for Healthcare Providers: SeriousBroker.it  This test is not yet approved or cleared by the [place]  FDA and has been authorized for detection and/or diagnosis of SARS-CoV-2 by FDA under an Emergency Use Authorization (EUA). This EUA will remain in effect (meaning this test can be used) for the duration of the COVID-19 declaration under Section 564(b)(1) of the Act, 21 U.S.C. section 360bbb-3(b)(1), unless the authorization is terminated or revoked.  Performed at [facility], [address] [place], [place] [address]   Urine Culture     Status: None   Collection Time: 10/29/23  1:15 PM   Specimen: Urine, Clean Catch  Result Value Ref Range Status   Specimen Description   Final    URINE, CLEAN CATCH Performed at [facility], [address] [place], [place] [address]    Special Requests   Final    NONE Performed at [facility], [address] [place], [place] [address]    Culture   Final    NO GROWTH Performed at  [facility], [address], [place], [place] [address]    Report Status 10/30/2023 FINAL  Final  MRSA Next Gen by PCR, Nasal     Status: None   Collection Time: 10/29/23  1:21 PM   Specimen: Nasal Mucosa; Nasal Swab  Result Value Ref Range Status   MRSA by PCR Next Gen NOT DETECTED NOT DETECTED Final    Comment: (NOTE) The GeneXpert MRSA Assay (FDA approved for NASAL specimens only), is one component of a comprehensive MRSA colonization surveillance program. It is not intended to diagnose MRSA infection nor to guide or monitor treatment for MRSA infections. Test performance is not FDA approved in patients less than 76 years old. Performed at [facility], [address] [place], [place] [address]   C Difficile Quick Screen w PCR reflex     Status: None   Collection Time: 10/30/23 12:11 PM   Specimen: STOOL  Result Value Ref Range Status   C Diff antigen NEGATIVE NEGATIVE Final   C Diff toxin NEGATIVE NEGATIVE Final   C Diff interpretation No C. difficile detected.  Final    Comment: Performed at [facility], [address] [place], [place] [address]    Labs: CBC: Recent Labs  Lab 11/02/23 0455 11/03/23 0527 11/04/23 0509 11/05/23 0406 11/06/23 0452  WBC 8.8 6.8 7.7 9.5 8.5  HGB 8.4* 9.6* 8.9* 8.4* 8.0*  HCT 27.8* 31.2* 28.0* 28.1* 26.6*  MCV 82.2 81.7 81.4 83.4 83.1  PLT 78* 117* 172 220 263   Basic Metabolic Panel: Recent Labs  Lab 11/02/23 0455 11/03/23 0527 11/04/23 0509 11/05/23 0406 11/06/23 0452 11/07/23 0435 11/08/23 0419  NA 137 140 139 142 142  --   --   K 3.6 3.6 3.2* 3.4* 3.8  --   --   CL 104 106 106 105 107  --   --   CO2 21* 24 24 26 25   --   --   GLUCOSE 258* 202* 202* 136* 92  --   --   BUN 62* 53* 40* 33* 26*  --   --   CREATININE 2.94* 2.30* 1.73* 1.51* 1.30*  --   --   CALCIUM 7.9* 8.3* 8.0* 8.0* 8.0*  --   --   MG  --   --   --  1.5* 1.6* 1.6* 1.7    Liver Function Tests: No results for input(s): "AST", "ALT", "ALKPHOS", "BILITOT", "PROT", "ALBUMIN" in the last 168 hours.  CBG: Recent Labs  Lab 11/07/23 0718 11/07/23 1107 11/07/23 1630 11/07/23 2102 11/08/23 0710  GLUCAP 158* 226* 280* 320* 224*    Discharge time spent: greater than 30 minutes.  Signed: Bobbetta Burnet, MD [facility] 11/08/2023

## 2023-11-09 ENCOUNTER — Other Ambulatory Visit: Payer: Self-pay | Admitting: Adult Health

## 2023-11-09 ENCOUNTER — Encounter: Payer: Self-pay | Admitting: Adult Health

## 2023-11-09 ENCOUNTER — Non-Acute Institutional Stay (SKILLED_NURSING_FACILITY): Payer: Self-pay | Admitting: Adult Health

## 2023-11-09 DIAGNOSIS — Z8673 Personal history of transient ischemic attack (TIA), and cerebral infarction without residual deficits: Secondary | ICD-10-CM

## 2023-11-09 DIAGNOSIS — E1169 Type 2 diabetes mellitus with other specified complication: Secondary | ICD-10-CM | POA: Diagnosis not present

## 2023-11-09 DIAGNOSIS — I700 Atherosclerosis of aorta: Secondary | ICD-10-CM

## 2023-11-09 DIAGNOSIS — I129 Hypertensive chronic kidney disease with stage 1 through stage 4 chronic kidney disease, or unspecified chronic kidney disease: Secondary | ICD-10-CM

## 2023-11-09 DIAGNOSIS — E1122 Type 2 diabetes mellitus with diabetic chronic kidney disease: Secondary | ICD-10-CM

## 2023-11-09 DIAGNOSIS — D696 Thrombocytopenia, unspecified: Secondary | ICD-10-CM

## 2023-11-09 DIAGNOSIS — N1831 Chronic kidney disease, stage 3a: Secondary | ICD-10-CM

## 2023-11-09 DIAGNOSIS — K2100 Gastro-esophageal reflux disease with esophagitis, without bleeding: Secondary | ICD-10-CM

## 2023-11-09 DIAGNOSIS — E441 Mild protein-calorie malnutrition: Secondary | ICD-10-CM | POA: Diagnosis not present

## 2023-11-09 DIAGNOSIS — D631 Anemia in chronic kidney disease: Secondary | ICD-10-CM

## 2023-11-09 DIAGNOSIS — E785 Hyperlipidemia, unspecified: Secondary | ICD-10-CM

## 2023-11-09 DIAGNOSIS — N3941 Urge incontinence: Secondary | ICD-10-CM | POA: Diagnosis not present

## 2023-11-09 DIAGNOSIS — N183 Chronic kidney disease, stage 3 unspecified: Secondary | ICD-10-CM

## 2023-11-09 DIAGNOSIS — I69354 Hemiplegia and hemiparesis following cerebral infarction affecting left non-dominant side: Secondary | ICD-10-CM

## 2023-11-09 DIAGNOSIS — E1142 Type 2 diabetes mellitus with diabetic polyneuropathy: Secondary | ICD-10-CM

## 2023-11-09 DIAGNOSIS — E669 Obesity, unspecified: Secondary | ICD-10-CM

## 2023-11-09 MED ORDER — PREGABALIN 50 MG PO CAPS
50.0000 mg | ORAL_CAPSULE | Freq: Two times a day (BID) | ORAL | 0 refills | Status: DC
Start: 2023-11-09 — End: 2023-11-30

## 2023-11-09 NOTE — Progress Notes (Signed)
 Location:  [facility] Room Number: 128 Place of Service:  SNF (31)   CODE STATUS: full   No Known Allergies  Chief Complaint  Patient presents with   Hospitalization Follow-up    HPI:   She is a 63 year old woman who has been hospitalized from 10-29-23 through 11-08-23. Her past medical history includes: diabetes mellitus type 2; hypertension; hyperlipidemia; CVA with left side hemiparesis. She presented to the ED after several days of generalized weakness and confusion. She is normally cognitively impaired her spouse is her care provider. At her baseline she is able to ambulate with a cane and standby assist. Her weakness got worse until she was unable to get out of bed, with increased somnolence. Her UA was indicative of a UTI and was started on cefepime . She became hypotensive and was started on levophed . Her antibiotic coverage was then broadened to include zyvox  and merrum. Her urine culture was negative for growth.  She has completed her antibiotics. She is here for short term rehab with her goal to return back home. She will continue to be followed for her chronic illnesses including: Hypertension associated with stage 3a chronic kidney disease due to type 2 diabetes mellitus:  Hyperlipidemia associated with type 2 diabetes mellitus: Stage 3a chronic kidney disease due to type 2 diabetes mellitus:  Urge incontinence    Past Medical History:  Diagnosis Date   Cerebrovascular disease 05/23/2019   Essential hypertension, benign 05/23/2019   History of stroke 2007   left leg weakness   HLD (hyperlipidemia) 05/23/2019   Morbid obesity (HCC) 05/23/2019   Type II diabetes mellitus, uncontrolled 05/23/2019   UTI (urinary tract infection)    Vitamin D  deficiency     Past Surgical History:  Procedure Laterality Date   AMPUTATION Right 11/09/2019   Procedure: 5TH AMPUTATION RAY;  Surgeon: Charity Conch, DPM;  Location: [facility] OR;  Service: Podiatry;  Laterality:  Right;   CHOLECYSTECTOMY     COLONOSCOPY N/A 11/12/2020   Procedure: COLONOSCOPY;  Surgeon: Suzette Espy, MD;  Location: [facility];  Service: Endoscopy;  Laterality: N/A;  ASA III / PM procedure   POLYPECTOMY  11/12/2020   Procedure: POLYPECTOMY;  Surgeon: Suzette Espy, MD;  Location: [facility];  Service: Endoscopy;;    Social History   Socioeconomic History   Marital status: Married    Spouse name: Not on file   Number of children: Not on file   Years of education: 13   Highest education level: Some college, no degree  Occupational History   Occupation: Disability    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  Tobacco Use   Smoking status: Former    Current packs/day: 0.00    Average packs/day: 2.0 packs/day for 20.0 years (40.0 ttl pk-yrs)    Types: Cigarettes    Start date: 07/26/1985    Quit date: 07/26/2005    Years since quitting: 18.3   Smokeless tobacco: Never  Vaping Use   Vaping status: Never Used  Substance and Sexual Activity   Alcohol use: No    Alcohol/week: 0.0 standard drinks of alcohol   Drug use: No   Sexual activity: Not Currently    Birth control/protection: Post-menopausal  Other Topics Concern   Not on file  Social History Narrative   Married for 31 yrs.On disability secondary to CVA.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   Social Drivers of Health   Financial Resource Strain: Low Risk  (11/23/2022)  Received [facility]   Overall Financial Resource Strain (CARDIA)    Difficulty of Paying Living Expenses: Not very hard  Food Insecurity: No Food Insecurity (10/29/2023)   Hunger Vital Sign    Worried About Running Out of Food in the Last Year: Never true    Ran Out of Food in the Last Year: Never true  Transportation Needs: No Transportation Needs (10/29/2023)   PRAPARE - Administrator, Civil Service (Medical): No    Lack of Transportation (Non-Medical): No  Physical Activity: Inactive (07/14/2023)    Received from [facility]   Exercise Vital Sign    Days of Exercise per Week: 0 days    Minutes of Exercise per Session: 0 min  Stress: No Stress Concern Present (07/14/2023)   Received from [facility] - Occupational Stress Questionnaire    Feeling of Stress : Only a little  Social Connections: Moderately Integrated (07/14/2023)   Received from [facility]   Social Connection and Isolation Panel [NHANES]    Frequency of Communication with Friends and Family: Three times a week    Frequency of Social Gatherings with Friends and Family: Three times a week    Attends Religious Services: More than 4 times per year    Active Member of Clubs or Organizations: No    Attends Banker Meetings: Never    Marital Status: Married  Catering manager Violence: Not At Risk (10/29/2023)   Humiliation, Afraid, Rape, and Kick questionnaire    Fear of Current or Ex-Partner: No    Emotionally Abused: No    Physically Abused: No    Sexually Abused: No   Family History  Problem Relation Age of Onset   Heart disease Mother    Diabetes Mother    Hypertension Mother    Cancer Father        Lung   Hypertension Sister    Macular degeneration Sister    Appendicitis Brother    Hypertension Sister    Diabetes Sister       VITAL SIGNS BP 129/85   Pulse 78   Temp 98.6 F (37 C)   Resp (!) 22   Ht 5' 5.5" (1.664 m)   Wt 259 lb (117.5 kg) Comment: from hospital  LMP 03/02/2012   SpO2 93%   BMI 42.44 kg/m   Outpatient Encounter Medications as of 11/09/2023  Medication Sig   fesoterodine  (TOVIAZ ) 8 MG TB24 tablet Take 1 tablet (8 mg total) by mouth daily.   LANTUS  SOLOSTAR 100 UNIT/ML Solostar Pen ADMINISTER 40 UNITS UNDER THE SKIN AT BEDTIME (Patient taking differently: Inject 40 Units into the skin at bedtime. ADMINISTER 40 UNITS UNDER THE SKIN AT BEDTIME)   amLODipine  (NORVASC ) 10 MG tablet Take 1 tablet (10 mg total) by mouth  daily.   aspirin  325 MG tablet Take 325 mg by mouth daily.   cholecalciferol (VITAMIN D ) 1000 UNITS tablet Take 1,000 Units by mouth daily.   Dulaglutide  (TRULICITY ) 4.5 MG/0.5ML SOPN Inject 4.5 mg as directed once a week. (Patient taking differently: Inject 4.5 mg as directed once a week. Sundays)   fluticasone  (FLONASE ) 50 MCG/ACT nasal spray SHAKE LIQUID AND USE 2 SPRAYS IN EACH NOSTRIL EVERY DAY (Patient taking differently: Place 2 sprays into both nostrils daily as needed for allergies.)   glucose blood (ACCU-CHEK GUIDE) test strip TEST FOUR TIMES A DAY   Insulin  Pen Needle 29G  X 12. MISC 1 Units by Does not apply route daily at 12 noon.   labetalol  (NORMODYNE ) 200 MG tablet Take 1 tablet (200 mg total) by mouth 2 (two) times daily.   lidocaine  (LIDODERM ) 5 % Place 1 patch onto the skin daily. Remove & Discard patch within 12 hours or as directed by MD   magnesium  oxide (MAG-OX) 400 (240 Mg) MG tablet Take 1 tablet (400 mg total) by mouth 2 (two) times daily.   Nystatin  (GERHARDT'S BUTT CREAM) CREA Apply 1 Application topically daily.   omeprazole (PRILOSEC) 20 MG capsule Take 1 capsule by mouth daily.   pregabalin  (LYRICA ) 50 MG capsule Take 1 capsule (50 mg total) by mouth 2 (two) times daily.   simvastatin  (ZOCOR ) 40 MG tablet Take 1 tablet by mouth at bedtime.   vitamin B-12 (VITAMIN B12) 500 MCG tablet Take 1 tablet (500 mcg total) by mouth daily. (Patient taking differently: Take 1,000 mcg by mouth daily.)   No facility-administered encounter medications on file as of 11/09/2023.     SIGNIFICANT DIAGNOSTIC EXAMS  LABS REVIEWED  10-29-23: wbc 12.2; hgb 9.3; hct 29.5; mcv 82.6 plt 152; glucose 163; bun 37; creat 1.99; k+ 3.5; na++ 138; ca 8.3 gfr 28; protein 5.6 albumin 2.5; vitamin B12: 169; folate 10.4; hgb A1c 8.5 11-03-23; wbc 6.8; hgb 9.6; hct 31.2; mcv 81.7 plt 117; glucose 202; bun 53; creat 2.30; k+ 3.6; na++ 140; ca 8.3 gfr 23   Review of Systems  Constitutional:   Negative for malaise/fatigue.  Respiratory:  Negative for cough and shortness of breath.   Cardiovascular:  Positive for leg swelling. Negative for chest pain and palpitations.  Gastrointestinal:  Negative for abdominal pain, constipation and heartburn.  Musculoskeletal:  Positive for myalgias. Negative for back pain and joint pain.  Skin: Negative.   Neurological:  Negative for dizziness.  Psychiatric/Behavioral:  The patient is not nervous/anxious.    Physical Exam Constitutional:      General: She is not in acute distress.    Appearance: She is well-developed. She is obese. She is not diaphoretic.  Neck:     Thyroid : No thyromegaly.  Cardiovascular:     Rate and Rhythm: Normal rate and regular rhythm.     Pulses: Normal pulses.     Heart sounds: Murmur heard.  Pulmonary:     Effort: Pulmonary effort is normal. No respiratory distress.     Breath sounds: Normal breath sounds.  Abdominal:     General: Bowel sounds are normal. There is no distension.     Palpations: Abdomen is soft.     Tenderness: There is no abdominal tenderness.  Musculoskeletal:     Cervical back: Neck supple.     Right lower leg: Edema present.     Left lower leg: Edema present.     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  Skin:    General: Skin is warm and dry.  Neurological:     Mental Status: She is alert. Mental status is at baseline.  Psychiatric:        Mood and Affect: Mood normal.     ASSESSMENT/ PLAN:  TODAY  Hypertension associated with stage 3a chronic kidney disease due to type 2 diabetes mellitus: b/p 129/85 will continue norvasc  10 mg daily labetolol 200 mg twice daily asa 325 mg daily   2. Hyperlipidemia associated with type 2 diabetes mellitus: will continue zocor  40 mg daily  3. Stage 3a chronic kidney disease due to type 2 diabetes mellitus: bun 53; creat 2.30; gfr 23  4. Urge incontinence: will continue  toviaz  8 mg daily   5. Mild protein calorie malnutrition: protein 5.6 albumin 2.5 will continue supplements as directed  6. Morbid obesity: BMI 43.72 has diabetes; hypertension; hyperlipidemia  7. Hypomagnesemia is on magox 400 mg twice daily   8. Diabetic peripheral neuropathy associated with type 2 diabetes mellitus: will continue lyrica  50 mg twice daily and lidoderm  patch to left elbow daily   9. Type 2 diabetes mellitus with obesity: hgb A1c 8.5 will continue trulicity  4.5 mg weekly; lantus  40 units nightly   10. History of CVA (cerebrovascular accident) is on asa 325 mg daily   11. Anemia, chronic renal failure stage 3: hgb 9.6 will begin ferrous sulfate  325 mg three times weekly   12. Thrombocytopenia: plt 117  13. Hemiparesis of left nondominant side as late effect of cerebrovascular accident   56. Aortic atherosclerosis (ct 08-04-22) is on asa and statin  15. GERD without esophagitis: will continue prilosec 20 mg daily    Britt Candle NP [facility]   call [phone]

## 2023-11-10 ENCOUNTER — Encounter: Payer: Self-pay | Admitting: Internal Medicine

## 2023-11-10 ENCOUNTER — Non-Acute Institutional Stay (SKILLED_NURSING_FACILITY): Admitting: Internal Medicine

## 2023-11-10 DIAGNOSIS — E46 Unspecified protein-calorie malnutrition: Secondary | ICD-10-CM | POA: Insufficient documentation

## 2023-11-10 DIAGNOSIS — D649 Anemia, unspecified: Secondary | ICD-10-CM | POA: Diagnosis not present

## 2023-11-10 DIAGNOSIS — N3000 Acute cystitis without hematuria: Secondary | ICD-10-CM

## 2023-11-10 DIAGNOSIS — E1169 Type 2 diabetes mellitus with other specified complication: Secondary | ICD-10-CM

## 2023-11-10 DIAGNOSIS — A419 Sepsis, unspecified organism: Secondary | ICD-10-CM | POA: Diagnosis not present

## 2023-11-10 DIAGNOSIS — E669 Obesity, unspecified: Secondary | ICD-10-CM

## 2023-11-10 DIAGNOSIS — G9341 Metabolic encephalopathy: Secondary | ICD-10-CM | POA: Diagnosis not present

## 2023-11-10 DIAGNOSIS — E441 Mild protein-calorie malnutrition: Secondary | ICD-10-CM

## 2023-11-10 DIAGNOSIS — I10 Essential (primary) hypertension: Secondary | ICD-10-CM

## 2023-11-10 LAB — GLUCOSE, CAPILLARY: Glucose-Capillary: 290 mg/dL — ABNORMAL HIGH (ref 70–99)

## 2023-11-10 NOTE — Assessment & Plan Note (Addendum)
 While hospitalized glucoses ranged from a low of 92 up to high of 319; both were outliers.  A1c was 8.5% which is suboptimal control.  Metformin was discontinued during the hospitalization because of AKI. FBS monitor at SNF; 4/16 FBS 202; today value 160.  Continue to monitor.  A1c goal would be less than 8% as long as no associated hypoglycemia.

## 2023-11-10 NOTE — Assessment & Plan Note (Addendum)
 Significant systolic blood pressure elevation present today.  Average will be verified and medications adjusted.  She does have significant peripheral edema in the context of amlodipine therapy.  Consideration could be given to weaning amlodipine to other antihypertensives although  CKD will impact antihypertensive regimen potentially.  She is also on beta-blocker.

## 2023-11-10 NOTE — Assessment & Plan Note (Signed)
 She appears to be back in her baseline. BCAT MMSE will be performed at the SNF.

## 2023-11-10 NOTE — Patient Instructions (Signed)
 See assessment and plan under each diagnosis in the problem list and acutely for this visit

## 2023-11-10 NOTE — Assessment & Plan Note (Signed)
 Initial H/H was 10.1/33.6; values at discharge were 8.0/26.6.  No bleeding dyscrasias reported.  Continue to monitor.

## 2023-11-10 NOTE — Progress Notes (Signed)
 NURSING HOME LOCATION:  [facility] ROOM NUMBER:  128P  CODE STATUS: Full Code    PCP: She states that she is in the process of establishing with a new physician as her prior PCP recently died.  This is a comprehensive admission note to this SNFperformed on this date less than 30 days from date of admission. Included are preadmission medical/surgical history; reconciled medication list; family history; social history and comprehensive review of systems.  Corrections and additions to the records were documented. Comprehensive physical exam was also performed. Additionally a clinical summary was entered for each active diagnosis pertinent to this admission in the Problem List to enhance continuity of care.  HPI: She was hospitalized 4/5 - 11/08/2023 presenting with generalized weakness and confusion which had begun 4/1.  This was in the context of baseline cognitive impairment with history of stroke with residual left hemiparesis. From 4/1 there was progressive weakness noted by her spouse.  On the evening of 4/4 patient was not able to get out of bed because of severe weakness associated with increasing somnolence.  Apparently at baseline she speaks fluently but is pleasantly confused and oriented x 1.  Although the initial white count was only 10,900; lactic acid level was 3.7.  Lactic acid level peaked at 5.2.  Subsequently white count peaked at 27,800.  Urinalysis revealed greater than 50 WBCs and greater than 50 RBCs.  Cefepime was initiated empirically. In the ED she became hypotensive and was started on Levophed. Antibiotic therapy was broadened to Zyvox and Merrem and she was able to be weaned off the Levophed.  Urine culture 4/5 subsequently revealed no growth.  Final white count was normal at 8500. Hospital course was complicated by acute metabolic encephalopathy superimposed on baseline neurocognitive deficits; acute respiratory failure with hypoxia; and acute on chronic  CKD stage IIIa.  The latter prompted discontinuation of metformin. While hospitalized creatinine ranged from initial value 1.59 up to a peak of 3.22.  Final value was 1.30.  Initial GFR was 36; nadir value was 16.  Final GFR was 46 indicating CKD low stage IIIa. A1c was 8.5%; glucoses ranged from a low of 92 up to a high of 319.  Protein/caloric malnutrition suggested by albumin of 2.0 and total protein of 6.3.  TSH was low normal at 0.751.  Initial H/H was 10.1/33.6; final values were 8.0/26.6. PT/OT consulted and recommended SNF placement for rehab.  Past medical and surgical history: Includes essential hypertension, history of stroke, dyslipidemia, morbid obesity, vitamin D deficiency, and diabetes with vascular complications and CKD.  Family history: extensive history reviewed.  Social history: Nondrinker; former smoker.   Review of systems: Clinical neurocognitive deficits made validity of responses questionable , compromising ROS completion.  She initially could not tell me the reason for her admission to the hospital.  She did validate her friend's diagnosis of urinary tract infection.  She stated that this "was driving me crazy."  She began by telling me that she had been "disabled for a few years" and "sick last month."  She validated the stroke in 2007 which has resulted in "my memory not great."  She states she is here "learning and being trained."  When asked about her glucoses at home her only description was "doing good."  She  does describe polydipsia.  When asked about neuropathic symptoms she stated "neuropathy is my middle name."  She does validate some dysphagia and occasional dyspepsia.  She also describes constipation.  Constitutional: No fever,  significant weight change, fatigue  Eyes: No redness, discharge, pain, vision change ENT/mouth: No nasal congestion, purulent discharge, earache, change in hearing, sore throat  Cardiovascular: No chest pain, palpitations, paroxysmal  nocturnal dyspnea, claudication, edema  Respiratory: No cough, sputum production, hemoptysis, DOE, significant snoring, apnea Gastrointestinal: No abdominal pain, nausea /vomiting, rectal bleeding, melena Genitourinary: No dysuria, hematuria, pyuria, incontinence, nocturia Musculoskeletal: No joint stiffness, joint swelling, weakness, pain Dermatologic: No rash, pruritus, change in appearance of skin Neurologic: No dizziness, headache, syncope, seizures Psychiatric: No significant anxiety, depression, insomnia, anorexia Endocrine: No change in hair/skin/nails, excessive hunger, excessive urination  Hematologic/lymphatic: No significant bruising, lymphadenopathy, abnormal bleeding Allergy/immunology: No itchy/watery eyes, significant sneezing, urticaria, angioedema  Physical exam:  Pertinent or positive findings: Hair is thin and disheveled.  There is marked exotropia of the left eye.  She has an upper plate & a lower partial.  Breath sounds are markedly decreased.  Grade 1 Systolic murmur is present at the right base.  Central obesity is present with protuberance of the abdomen.  There is 1+ pedal edema at the sock line.  There is tense edema of the lower extremities below the knee. Pedal pulses are not palpable.  There is no range of motion of the left upper extremity.  The right lower extremity is stronger to opposition than the left lower extremity.IOW suggested.  General appearance:  no acute distress, increased work of breathing is present.   Lymphatic: No lymphadenopathy about the head, neck, axilla. Eyes: No conjunctival inflammation or lid edema is present. There is no scleral icterus. Ears:  External ear exam shows no significant lesions or deformities.   Nose:  External nasal examination shows no deformity or inflammation. Nasal mucosa are pink and moist without lesions, exudates Neck:  No thyromegaly, masses, tenderness noted.    Heart:  Normal rate and regular rhythm. S1 and S2 normal  without gallop, click, rub.  Lungs:without wheezes, rhonchi, rales, rubs. Abdomen: Bowel sounds are normal.  Abdomen is soft and nontender with no organomegaly, hernias, masses. GU: Deferred  Extremities:  No cyanosis, clubbing Neurologic exam:Balance, Rhomberg, finger to nose testing could not be completed due to clinical state Skin: Warm & dry w/o tenting. No significant lesions or rash.  See clinical summary under each active problem in the Problem List with associated updated therapeutic plan

## 2023-11-10 NOTE — Assessment & Plan Note (Signed)
 She remains afebrile and asymptomatic at this time.  Continue to monitor.

## 2023-11-10 NOTE — Assessment & Plan Note (Addendum)
 Although urinalysis revealed greater than 50 white cells and greater than 50 red blood cells; urine culture 4/5 revealed no growth. Initial cefepime was broadened to Zyvox and Merrem.  She has completed antibiotic course.  She has no GU symptoms at this time.  Continue to monitor.

## 2023-11-10 NOTE — Assessment & Plan Note (Signed)
 Albumin 2.0 and total protein 6.3.  Interosseous wasting of the hands suggested.  Monitor nutritional intake and weights at the SNF.

## 2023-11-14 DIAGNOSIS — E1122 Type 2 diabetes mellitus with diabetic chronic kidney disease: Secondary | ICD-10-CM | POA: Insufficient documentation

## 2023-11-14 DIAGNOSIS — D631 Anemia in chronic kidney disease: Secondary | ICD-10-CM | POA: Insufficient documentation

## 2023-11-14 DIAGNOSIS — G8194 Hemiplegia, unspecified affecting left nondominant side: Secondary | ICD-10-CM | POA: Insufficient documentation

## 2023-11-14 DIAGNOSIS — Z8673 Personal history of transient ischemic attack (TIA), and cerebral infarction without residual deficits: Secondary | ICD-10-CM | POA: Insufficient documentation

## 2023-11-14 DIAGNOSIS — E1169 Type 2 diabetes mellitus with other specified complication: Secondary | ICD-10-CM | POA: Insufficient documentation

## 2023-11-14 DIAGNOSIS — I700 Atherosclerosis of aorta: Secondary | ICD-10-CM | POA: Insufficient documentation

## 2023-11-14 DIAGNOSIS — E1142 Type 2 diabetes mellitus with diabetic polyneuropathy: Secondary | ICD-10-CM | POA: Insufficient documentation

## 2023-11-14 DIAGNOSIS — D696 Thrombocytopenia, unspecified: Secondary | ICD-10-CM | POA: Insufficient documentation

## 2023-11-17 ENCOUNTER — Other Ambulatory Visit (HOSPITAL_COMMUNITY)
Admission: RE | Admit: 2023-11-17 | Discharge: 2023-11-17 | Disposition: A | Discharge status: Home / Self Care | Source: Skilled Nursing Facility | Attending: Adult Health | Admitting: Adult Health

## 2023-11-17 DIAGNOSIS — E1159 Type 2 diabetes mellitus with other circulatory complications: Secondary | ICD-10-CM | POA: Insufficient documentation

## 2023-11-17 LAB — COMPREHENSIVE METABOLIC PANEL WITH GFR
ALT: 10 U/L (ref 0–44)
AST: 10 U/L — ABNORMAL LOW (ref 15–41)
Albumin: 2.8 g/dL — ABNORMAL LOW (ref 3.5–5.0)
Alkaline Phosphatase: 76 U/L (ref 38–126)
Anion gap: 11 (ref 5–15)
BUN: 21 mg/dL (ref 8–23)
CO2: 26 mmol/L (ref 22–32)
Calcium: 8.6 mg/dL — ABNORMAL LOW (ref 8.9–10.3)
Chloride: 101 mmol/L (ref 98–111)
Creatinine, Ser: 1.04 mg/dL — ABNORMAL HIGH (ref 0.44–1.00)
GFR, Estimated: >60 mL/min (ref >60)
Glucose, Bld: 126 mg/dL — ABNORMAL HIGH (ref 70–99)
Potassium: 3.4 mmol/L — ABNORMAL LOW (ref 3.5–5.1)
Sodium: 138 mmol/L (ref 135–145)
Total Bilirubin: 0.3 mg/dL (ref 0.0–1.2)
Total Protein: 6.8 g/dL (ref 6.5–8.1)

## 2023-11-17 LAB — CBC
HCT: 25.8 % — ABNORMAL LOW (ref 36.0–46.0)
Hemoglobin: 7.5 g/dL — ABNORMAL LOW (ref 12.0–15.0)
MCH: 24.5 pg — ABNORMAL LOW (ref 26.0–34.0)
MCHC: 29.1 g/dL — ABNORMAL LOW (ref 30.0–36.0)
MCV: 84.3 fL (ref 80.0–100.0)
Platelets: 347 10*3/uL (ref 150–400)
RBC: 3.06 MIL/uL — ABNORMAL LOW (ref 3.87–5.11)
RDW: 14.6 % (ref 11.5–15.5)
WBC: 7.7 10*3/uL (ref 4.0–10.5)
nRBC: 0 % (ref 0.0–0.2)

## 2023-11-17 LAB — MAGNESIUM: Magnesium: 1.8 mg/dL (ref 1.7–2.4)

## 2023-11-21 ENCOUNTER — Other Ambulatory Visit (HOSPITAL_COMMUNITY)
Admission: RE | Admit: 2023-11-21 | Discharge: 2023-11-21 | Disposition: A | Discharge status: Home / Self Care | Source: Skilled Nursing Facility | Attending: Adult Health | Admitting: Adult Health

## 2023-11-21 DIAGNOSIS — I129 Hypertensive chronic kidney disease with stage 1 through stage 4 chronic kidney disease, or unspecified chronic kidney disease: Secondary | ICD-10-CM | POA: Insufficient documentation

## 2023-11-21 LAB — BASIC METABOLIC PANEL WITH GFR
Anion gap: 10 (ref 5–15)
BUN: 14 mg/dL (ref 8–23)
CO2: 25 mmol/L (ref 22–32)
Calcium: 8.9 mg/dL (ref 8.9–10.3)
Chloride: 102 mmol/L (ref 98–111)
Creatinine, Ser: 1.2 mg/dL — ABNORMAL HIGH (ref 0.44–1.00)
GFR, Estimated: 51 mL/min — ABNORMAL LOW (ref >60)
Glucose, Bld: 152 mg/dL — ABNORMAL HIGH (ref 70–99)
Potassium: 4 mmol/L (ref 3.5–5.1)
Sodium: 137 mmol/L (ref 135–145)

## 2023-11-21 LAB — HEMOGLOBIN AND HEMATOCRIT, BLOOD
HCT: 29.6 % — ABNORMAL LOW (ref 36.0–46.0)
Hemoglobin: 8.8 g/dL — ABNORMAL LOW (ref 12.0–15.0)

## 2023-11-25 ENCOUNTER — Encounter: Payer: Self-pay | Admitting: Adult Health

## 2023-11-25 ENCOUNTER — Non-Acute Institutional Stay (SKILLED_NURSING_FACILITY): Payer: Self-pay | Admitting: Adult Health

## 2023-11-25 DIAGNOSIS — E1122 Type 2 diabetes mellitus with diabetic chronic kidney disease: Secondary | ICD-10-CM | POA: Diagnosis not present

## 2023-11-25 DIAGNOSIS — I129 Hypertensive chronic kidney disease with stage 1 through stage 4 chronic kidney disease, or unspecified chronic kidney disease: Secondary | ICD-10-CM

## 2023-11-25 DIAGNOSIS — I700 Atherosclerosis of aorta: Secondary | ICD-10-CM | POA: Diagnosis not present

## 2023-11-25 DIAGNOSIS — N1831 Chronic kidney disease, stage 3a: Secondary | ICD-10-CM

## 2023-11-25 DIAGNOSIS — D696 Thrombocytopenia, unspecified: Secondary | ICD-10-CM

## 2023-11-25 NOTE — Progress Notes (Signed)
 Location:  [facility] Room Number: [id number] Place of Service:  SNF (31)   CODE STATUS: full   No Known Allergies  Chief Complaint  Patient presents with   Acute Visit    Care plan meeting     HPI:  We have come together for her care plan meeting. BCAT 28/30;BIMS 15/15 mood 3/30: nervous at times, decline weights; declines care; calls staff names; did threaten to hit staff. Uses wheelchair without falls. She requires moderate to dependent assist with her adl care. Dietary: regular diet; setup for meals; appetite 26-75%.  She is frequently incontinent of bladder and bowel. Therapy: ambulate 20 feet with hemiwalker with min assist; upper and lower body mod assist; transfers min assist; no steps; BRP with assist.  She will continue to be followed for her chronic illnesses including:  Aortic atherosclerosis  Hypertension associated with stage 3 chronic kidney disease due to type 2 diabetes mellitus Thrombocytopenia  Past Medical History:  Diagnosis Date   Cerebrovascular disease 05/23/2019   Essential hypertension, benign 05/23/2019   History of stroke 2007   left leg weakness   HLD (hyperlipidemia) 05/23/2019   Morbid obesity (HCC) 05/23/2019   Type II diabetes mellitus, uncontrolled 05/23/2019   UTI (urinary tract infection)    Vitamin D  deficiency     Past Surgical History:  Procedure Laterality Date   AMPUTATION Right 11/09/2019   Procedure: 5TH AMPUTATION RAY;  Surgeon: Charity Conch, DPM;  [facility]: [facility] OR;  Service: Podiatry;  Laterality: Right;   CHOLECYSTECTOMY     COLONOSCOPY N/A 11/12/2020   Procedure: COLONOSCOPY;  Surgeon: Suzette Espy, MD;  [facility]: [facility];  Service: Endoscopy;  Laterality: N/A;  ASA III / PM procedure   POLYPECTOMY  11/12/2020   Procedure: POLYPECTOMY;  Surgeon: Suzette Espy, MD;  [facility]: [facility];  Service: Endoscopy;;    Social History   Socioeconomic History   Marital status: Married    Spouse name:  Not on file   Number of children: Not on file   Years of education: 13   Highest education level: Some college, no degree  Occupational History   Occupation: Disability    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  Tobacco Use   Smoking status: Former    Current packs/day: 0.00    Average packs/day: 2.0 packs/day for 20.0 years (40.0 ttl pk-yrs)    Types: Cigarettes    Start date: 07/26/1985    Quit date: 07/26/2005    Years since quitting: 18.3   Smokeless tobacco: Never  Vaping Use   Vaping status: Never Used  Substance and Sexual Activity   Alcohol use: No    Alcohol/week: 0.0 standard drinks of alcohol   Drug use: No   Sexual activity: Not Currently    Birth control/protection: Post-menopausal  Other Topics Concern   Not on file  Social History Narrative   Married for 31 yrs.On disability secondary to CVA.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   Social Drivers of Corporate investment banker Strain: Low Risk  (11/23/2022)   Received from [facility]   Overall Financial Resource Strain (CARDIA)    Difficulty of Paying Living Expenses: Not very hard  Food Insecurity: No Food Insecurity (10/29/2023)   Hunger Vital Sign    Worried About Running Out of Food in the Last Year: Never true    Ran Out of Food in the Last Year: Never true  Transportation Needs: No Transportation Needs (  10/29/2023)   PRAPARE - Administrator, Civil Service (Medical): No    Lack of Transportation (Non-Medical): No  Physical Activity: Inactive (07/14/2023)   Received from [facility]   Exercise Vital Sign    Days of Exercise per Week: 0 days    Minutes of Exercise per Session: 0 min  Stress: No Stress Concern Present (07/14/2023)   Received from [facility] of Occupational Health - Occupational Stress Questionnaire    Feeling of Stress : Only a little  Social Connections: Moderately Integrated (07/14/2023)   Received from [facility]    Social Connection and Isolation Panel [NHANES]    Frequency of Communication with Friends and Family: Three times a week    Frequency of Social Gatherings with Friends and Family: Three times a week    Attends Religious Services: More than 4 times per year    Active Member of Clubs or Organizations: No    Attends Banker Meetings: Never    Marital Status: Married  Catering manager Violence: Not At Risk (10/29/2023)   Humiliation, Afraid, Rape, and Kick questionnaire    Fear of Current or Ex-Partner: No    Emotionally Abused: No    Physically Abused: No    Sexually Abused: No   Family History  Problem Relation Age of Onset   Heart disease Mother    Diabetes Mother    Hypertension Mother    Cancer Father        Lung   Hypertension Sister    Macular degeneration Sister    Appendicitis Brother    Hypertension Sister    Diabetes Sister       VITAL SIGNS BP 132/78   Pulse 69   Temp (!) 97 F (36.1 C)   Resp 20   Ht 5' 5.5" (1.664 m)   Wt 240 lb 12.8 oz (109.2 kg)   LMP 03/02/2012   SpO2 95%   BMI 39.46 kg/m   Outpatient Encounter Medications as of 11/25/2023  Medication Sig   amLODipine  (NORVASC ) 10 MG tablet Take 1 tablet (10 mg total) by mouth daily.   aspirin  325 MG tablet Take 325 mg by mouth daily.   cholecalciferol (VITAMIN D ) 1000 UNITS tablet Take 1,000 Units by mouth daily.   Dulaglutide  (TRULICITY ) 4.5 MG/0.5ML SOPN Inject 4.5 mg as directed once a week. (Patient taking differently: Inject 4.5 mg as directed once a week. Sundays)   fesoterodine  (TOVIAZ ) 8 MG TB24 tablet Take 1 tablet (8 mg total) by mouth daily.   fluticasone  (FLONASE ) 50 MCG/ACT nasal spray SHAKE LIQUID AND USE 2 SPRAYS IN EACH NOSTRIL EVERY DAY (Patient taking differently: Place 2 sprays into both nostrils daily as needed for allergies.)   glucose blood (ACCU-CHEK GUIDE) test strip TEST FOUR TIMES A DAY   Insulin  Pen Needle 29G X 12.7MM MISC 1 Units by Does not apply route daily at  12 noon.   labetalol  (NORMODYNE ) 200 MG tablet Take 1 tablet (200 mg total) by mouth 2 (two) times daily.   LANTUS  SOLOSTAR 100 UNIT/ML Solostar Pen ADMINISTER 40 UNITS UNDER THE SKIN AT BEDTIME (Patient taking differently: Inject 40 Units into the skin at bedtime. ADMINISTER 40 UNITS UNDER THE SKIN AT BEDTIME)   lidocaine  (LIDODERM ) 5 % Place 1 patch onto the skin daily. Remove & Discard patch within 12 hours or as directed by MD   magnesium  oxide (MAG-OX) 400 (240 Mg) MG tablet Take 1 tablet (400  mg total) by mouth 2 (two) times daily.   Nystatin  (GERHARDT'S BUTT CREAM) CREA Apply 1 Application topically daily.   omeprazole (PRILOSEC) 20 MG capsule Take 1 capsule by mouth daily.   pregabalin  (LYRICA ) 50 MG capsule Take 1 capsule (50 mg total) by mouth 2 (two) times daily.   simvastatin  (ZOCOR ) 40 MG tablet Take 1 tablet by mouth at bedtime.   vitamin B-12 (VITAMIN B12) 500 MCG tablet Take 1 tablet (500 mcg total) by mouth daily. (Patient taking differently: Take 1,000 mcg by mouth daily.)   No facility-administered encounter medications on file as of 11/25/2023.     SIGNIFICANT DIAGNOSTIC EXAMS  LABS REVIEWED  10-29-23: wbc 12.2; hgb 9.3; hct 29.5; mcv 82.6 plt 152; glucose 163; bun 37; creat 1.99; k+ 3.5; na++ 138; ca 8.3 gfr 28; protein 5.6 albumin 2.5; vitamin B12: 169; folate 10.4; hgb A1c 8.5 11-03-23; wbc 6.8; hgb 9.6; hct 31.2; mcv 81.7 plt 117; glucose 202; bun 53; creat 2.30; k+ 3.6; na++ 140; ca 8.3 gfr 23   Review of Systems  Constitutional:  Negative for malaise/fatigue.  Respiratory:  Negative for cough and shortness of breath.   Cardiovascular:  Negative for chest pain, palpitations and leg swelling.  Gastrointestinal:  Negative for abdominal pain, constipation and heartburn.  Musculoskeletal:  Negative for back pain, joint pain and myalgias.  Skin: Negative.   Neurological:  Negative for dizziness.  Psychiatric/Behavioral:  The patient is not nervous/anxious.    Physical  Exam Constitutional:      General: She is not in acute distress.    Appearance: She is well-developed. She is not diaphoretic.  Neck:     Thyroid : No thyromegaly.  Cardiovascular:     Rate and Rhythm: Normal rate and regular rhythm.     Heart sounds: Normal heart sounds.  Pulmonary:     Effort: Pulmonary effort is normal. No respiratory distress.     Breath sounds: Normal breath sounds.  Abdominal:     General: Bowel sounds are normal. There is no distension.     Palpations: Abdomen is soft.     Tenderness: There is no abdominal tenderness.  Musculoskeletal:        General: Normal range of motion.     Cervical back: Neck supple.     Right lower leg: No edema.     Left lower leg: No edema.  Lymphadenopathy:     Cervical: No cervical adenopathy.  Skin:    General: Skin is warm and dry.  Neurological:     Mental Status: She is alert. Mental status is at baseline.  Psychiatric:        Mood and Affect: Mood normal.      ASSESSMENT/ PLAN:  TODAY  Aortic atherosclerosis Hypertension associated with stage 3 chronic kidney disease due to type 2 diabetes mellitus Thrombocytopenia  Will continue current medications Will continue therapy as directed Will continue to monitor her status  Goal is to return back home. Spouse is caregiver.   Time spent with patient: 40 minutes: medications; therapy; goals of care.    Britt Candle NP [facility]   call [phone]

## 2023-11-30 ENCOUNTER — Other Ambulatory Visit: Payer: Self-pay | Admitting: Adult Health

## 2023-11-30 ENCOUNTER — Encounter: Payer: Self-pay | Admitting: Adult Health

## 2023-11-30 ENCOUNTER — Non-Acute Institutional Stay (SKILLED_NURSING_FACILITY): Payer: Self-pay | Admitting: Adult Health

## 2023-11-30 DIAGNOSIS — I700 Atherosclerosis of aorta: Secondary | ICD-10-CM | POA: Diagnosis not present

## 2023-11-30 DIAGNOSIS — E1122 Type 2 diabetes mellitus with diabetic chronic kidney disease: Secondary | ICD-10-CM

## 2023-11-30 DIAGNOSIS — I69354 Hemiplegia and hemiparesis following cerebral infarction affecting left non-dominant side: Secondary | ICD-10-CM

## 2023-11-30 DIAGNOSIS — N3281 Overactive bladder: Secondary | ICD-10-CM

## 2023-11-30 DIAGNOSIS — I129 Hypertensive chronic kidney disease with stage 1 through stage 4 chronic kidney disease, or unspecified chronic kidney disease: Secondary | ICD-10-CM | POA: Diagnosis not present

## 2023-11-30 DIAGNOSIS — N1831 Chronic kidney disease, stage 3a: Secondary | ICD-10-CM

## 2023-11-30 MED ORDER — LANTUS SOLOSTAR 100 UNIT/ML ~~LOC~~ SOPN: PEN_INJECTOR | SUBCUTANEOUS | 0 refills | Status: AC

## 2023-11-30 MED ORDER — PREGABALIN 50 MG PO CAPS: 50.0000 mg | ORAL_CAPSULE | Freq: Two times a day (BID) | ORAL | 0 refills | Status: AC

## 2023-11-30 MED ORDER — TRULICITY 4.5 MG/0.5ML ~~LOC~~ SOAJ: 4.5000 mg | SUBCUTANEOUS | 0 refills | Status: AC

## 2023-11-30 MED ORDER — LABETALOL HCL 200 MG PO TABS: 200.0000 mg | ORAL_TABLET | Freq: Two times a day (BID) | ORAL | 0 refills | Status: AC

## 2023-11-30 MED ORDER — FESOTERODINE FUMARATE ER 8 MG PO TB24: 8.0000 mg | ORAL_TABLET | Freq: Every day | ORAL | 0 refills | Status: AC

## 2023-11-30 MED ORDER — MAGNESIUM OXIDE -MG SUPPLEMENT 400 (240 MG) MG PO TABS: 400.0000 mg | ORAL_TABLET | Freq: Two times a day (BID) | ORAL | 0 refills | Status: AC

## 2023-11-30 MED ORDER — LIDOCAINE 5 % EX PTCH: 1.0000 | MEDICATED_PATCH | CUTANEOUS | 0 refills | Status: AC

## 2023-11-30 MED ORDER — SIMVASTATIN 40 MG PO TABS: 40.0000 mg | ORAL_TABLET | Freq: Every day | ORAL | 0 refills | Status: AC

## 2023-11-30 MED ORDER — AMLODIPINE BESYLATE 10 MG PO TABS: 10.0000 mg | ORAL_TABLET | Freq: Every day | ORAL | 0 refills | Status: AC

## 2023-11-30 NOTE — Progress Notes (Signed)
 Location:  [facility] Room Number: [id number] Place of Service:  SNF (31)   CODE STATUS: full   No Known Allergies  Chief Complaint  Patient presents with   Discharge Note    HPI:  She is being discharged to home with home health for pt/ot. She will need a hemiwalker. She will need her prescriptions written and will need to follow up with her medical provider. She had been hospitalized for increased weakness. She was admitted to this facility for short term rehab: ambulate 12 feet with hemiwalker and min assist; upper body min to mod assist; lower body min to mod assist; stand/pivot min assist; BCAT 29/50; brp mod assist   Past Medical History:  Diagnosis Date   Cerebrovascular disease 05/23/2019   Essential hypertension, benign 05/23/2019   History of stroke 2007   left leg weakness   HLD (hyperlipidemia) 05/23/2019   Morbid obesity (HCC) 05/23/2019   Type II diabetes mellitus, uncontrolled 05/23/2019   UTI (urinary tract infection)    Vitamin D  deficiency     Past Surgical History:  Procedure Laterality Date   AMPUTATION Right 11/09/2019   Procedure: 5TH AMPUTATION RAY;  Surgeon: Charity Conch, DPM;  Location: [facility] OR;  Service: Podiatry;  Laterality: Right;   CHOLECYSTECTOMY     COLONOSCOPY N/A 11/12/2020   Procedure: COLONOSCOPY;  Surgeon: Suzette Espy, MD;  [facility];  Service: Endoscopy;  Laterality: N/A;  ASA III / PM procedure   POLYPECTOMY  11/12/2020   Procedure: POLYPECTOMY;  Surgeon: Suzette Espy, MD;  [facility];  Service: Endoscopy;;    Social History   Socioeconomic History   Marital status: Married    Spouse name: Not on file   Number of children: Not on file   Years of education: 13   Highest education level: Some college, no degree  Occupational History   Occupation: Disability    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  Tobacco Use   Smoking status: Former    Current packs/day: 0.00     Average packs/day: 2.0 packs/day for 20.0 years (40.0 ttl pk-yrs)    Types: Cigarettes    Start date: 07/26/1985    Quit date: 07/26/2005    Years since quitting: 18.3   Smokeless tobacco: Never  Vaping Use   Vaping status: Never Used  Substance and Sexual Activity   Alcohol use: No    Alcohol/week: 0.0 standard drinks of alcohol   Drug use: No   Sexual activity: Not Currently    Birth control/protection: Post-menopausal  Other Topics Concern   Not on file  Social History Narrative   Married for 31 yrs.On disability secondary to CVA.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   Social Drivers of Corporate investment banker Strain: Low Risk  (11/23/2022)   Received from [facility]   Overall Financial Resource Strain (CARDIA)    Difficulty of Paying Living Expenses: Not very hard  Food Insecurity: No Food Insecurity (12/04/2023)   Hunger Vital Sign    Worried About Running Out of Food in the Last Year: Never true    Ran Out of Food in the Last Year: Never true  Transportation Needs: No Transportation Needs (12/04/2023)   PRAPARE - Administrator, Civil Service (Medical): No    Lack of Transportation (Non-Medical): No  Physical Activity: Inactive (07/14/2023)   Received from [facility]   Exercise Vital Sign    Days  of Exercise per Week: 0 days    Minutes of Exercise per Session: 0 min  Stress: No Stress Concern Present (07/14/2023)   Received from [facility] of Occupational Health - Occupational Stress Questionnaire    Feeling of Stress : Only a little  Social Connections: Moderately Integrated (07/14/2023)   Received from [facility]   Social Connection and Isolation Panel [NHANES]    Frequency of Communication with Friends and Family: Three times a week    Frequency of Social Gatherings with Friends and Family: Three times a week    Attends Religious Services: More than 4 times per year    Active Member of Clubs or  Organizations: No    Attends Banker Meetings: Never    Marital Status: Married  Catering manager Violence: Not At Risk (12/04/2023)   Humiliation, Afraid, Rape, and Kick questionnaire    Fear of Current or Ex-Partner: No    Emotionally Abused: No    Physically Abused: No    Sexually Abused: No   Family History  Problem Relation Age of Onset   Heart disease Mother    Diabetes Mother    Hypertension Mother    Cancer Father        Lung   Hypertension Sister    Macular degeneration Sister    Appendicitis Brother    Hypertension Sister    Diabetes Sister       VITAL SIGNS BP 132/78   Pulse (!) 55   Temp 98 F (36.7 C)   Resp 18   Ht 5' 5.5" (1.664 m)   Wt 235 lb (106.6 kg)   LMP 03/02/2012   SpO2 96%   BMI 38.51 kg/m   No facility-administered encounter medications on file as of 11/30/2023.   Outpatient Encounter Medications as of 11/30/2023  Medication Sig   aspirin  325 MG tablet Take 325 mg by mouth daily.   cholecalciferol (VITAMIN D ) 1000 UNITS tablet Take 1,000 Units by mouth daily.   fluticasone  (FLONASE ) 50 MCG/ACT nasal spray SHAKE LIQUID AND USE 2 SPRAYS IN EACH NOSTRIL EVERY DAY (Patient taking differently: Place 2 sprays into both nostrils daily as needed for allergies.)   Nystatin  (GERHARDT'S BUTT CREAM) CREA Apply 1 Application topically daily. (Patient taking differently: Apply 1 Application topically daily. For rash on lower extremities)   omeprazole (PRILOSEC) 20 MG capsule Take 20 mg by mouth daily.   [DISCONTINUED] amLODipine  (NORVASC ) 10 MG tablet Take 1 tablet (10 mg total) by mouth daily.   [DISCONTINUED] Dulaglutide  (TRULICITY ) 4.5 MG/0.5ML SOPN Inject 4.5 mg as directed once a week. (Patient taking differently: Inject 4.5 mg as directed once a week. Sundays)   [DISCONTINUED] fesoterodine  (TOVIAZ ) 8 MG TB24 tablet Take 1 tablet (8 mg total) by mouth daily.   [DISCONTINUED] glucose blood (ACCU-CHEK GUIDE) test strip TEST FOUR TIMES A DAY    [DISCONTINUED] Insulin  Pen Needle 29G X 12.7MM MISC 1 Units by Does not apply route daily at 12 noon.   [DISCONTINUED] labetalol  (NORMODYNE ) 200 MG tablet Take 1 tablet (200 mg total) by mouth 2 (two) times daily.   [DISCONTINUED] LANTUS  SOLOSTAR 100 UNIT/ML Solostar Pen ADMINISTER 40 UNITS UNDER THE SKIN AT BEDTIME (Patient taking differently: Inject 40 Units into the skin at bedtime. ADMINISTER 40 UNITS UNDER THE SKIN AT BEDTIME)   [DISCONTINUED] lidocaine  (LIDODERM ) 5 % Place 1 patch onto the skin daily. Remove & Discard patch within 12 hours or as directed by MD   [  DISCONTINUED] magnesium  oxide (MAG-OX) 400 (240 Mg) MG tablet Take 1 tablet (400 mg total) by mouth 2 (two) times daily.   [DISCONTINUED] pregabalin  (LYRICA ) 50 MG capsule Take 1 capsule (50 mg total) by mouth 2 (two) times daily.   [DISCONTINUED] simvastatin  (ZOCOR ) 40 MG tablet Take 1 tablet by mouth at bedtime.   [DISCONTINUED] vitamin B-12 (VITAMIN B12) 500 MCG tablet Take 1 tablet (500 mcg total) by mouth daily. (Patient taking differently: Take 1,000 mcg by mouth daily.)     SIGNIFICANT DIAGNOSTIC EXAMS  LABS REVIEWED  10-29-23: wbc 12.2; hgb 9.3; hct 29.5; mcv 82.6 plt 152; glucose 163; bun 37; creat 1.99; k+ 3.5; na++ 138; ca 8.3 gfr 28; protein 5.6 albumin 2.5; vitamin B12: 169; folate 10.4; hgb A1c 8.5 11-03-23; wbc 6.8; hgb 9.6; hct 31.2; mcv 81.7 plt 117; glucose 202; bun 53; creat 2.30; k+ 3.6; na++ 140; ca 8.3 gfr 23   Review of Systems  Constitutional:  Negative for malaise/fatigue.  Respiratory:  Negative for cough and shortness of breath.   Cardiovascular:  Negative for chest pain, palpitations and leg swelling.  Gastrointestinal:  Negative for abdominal pain, constipation and heartburn.  Musculoskeletal:  Negative for back pain, joint pain and myalgias.  Skin: Negative.   Neurological:  Negative for dizziness.  Psychiatric/Behavioral:  The patient is not nervous/anxious.    Physical Exam Constitutional:       General: She is not in acute distress.    Appearance: She is well-developed. She is obese. She is not diaphoretic.  Neck:     Thyroid : No thyromegaly.  Cardiovascular:     Rate and Rhythm: Normal rate and regular rhythm.     Pulses: Normal pulses.     Heart sounds: Normal heart sounds.  Pulmonary:     Effort: Pulmonary effort is normal. No respiratory distress.     Breath sounds: Normal breath sounds.  Abdominal:     General: Bowel sounds are normal. There is no distension.     Palpations: Abdomen is soft.     Tenderness: There is no abdominal tenderness.  Musculoskeletal:        General: Normal range of motion.     Cervical back: Neck supple.     Right lower leg: No edema.     Left lower leg: No edema.  Lymphadenopathy:     Cervical: No cervical adenopathy.  Skin:    General: Skin is warm and dry.  Neurological:     Mental Status: She is alert. Mental status is at baseline.  Psychiatric:        Mood and Affect: Mood normal.      ASSESSMENT/ PLAN:   Patient is being discharged with the following home health services:  pt/ot to evaluate and treat as indicated for gait balance strength adl training.   Patient is being discharged with the following durable medical equipment:  hemiwalker   Patient has been advised to f/u with their PCP in 1-2 weeks to for a transitions of care visit.  Social services at their facility was responsible for arranging this appointment.  Pt was provided with adequate prescriptions of noncontrolled medications to reach the scheduled appointment .  For controlled substances, a limited supply was provided as appropriate for the individual patient.  If the pt normally receives these medications from a pain clinic or has a contract with another physician, these medications should be received from that clinic or physician only).    A 30 day supply of her prescription medications  have been sent to [facility]   Time spent with patient: 40 minutes:  medications; dme; home health   Britt Candle NP [facility]   call [phone]

## 2023-12-02 ENCOUNTER — Observation Stay (HOSPITAL_COMMUNITY)
Admission: EM | Admit: 2023-12-02 | Discharge: 2023-12-07 | Disposition: A | Discharge status: Home / Self Care | Attending: Internal Medicine | Admitting: Internal Medicine

## 2023-12-02 ENCOUNTER — Other Ambulatory Visit: Payer: Self-pay

## 2023-12-02 ENCOUNTER — Encounter (HOSPITAL_COMMUNITY): Payer: Self-pay

## 2023-12-02 DIAGNOSIS — S8000XA Contusion of unspecified knee, initial encounter: Secondary | ICD-10-CM

## 2023-12-02 DIAGNOSIS — Y9289 Other specified places as the place of occurrence of the external cause: Secondary | ICD-10-CM | POA: Diagnosis not present

## 2023-12-02 DIAGNOSIS — Z79899 Other long term (current) drug therapy: Secondary | ICD-10-CM | POA: Diagnosis not present

## 2023-12-02 DIAGNOSIS — Z7982 Long term (current) use of aspirin: Secondary | ICD-10-CM | POA: Insufficient documentation

## 2023-12-02 DIAGNOSIS — E66812 Obesity, class 2: Secondary | ICD-10-CM | POA: Diagnosis present

## 2023-12-02 DIAGNOSIS — E785 Hyperlipidemia, unspecified: Secondary | ICD-10-CM | POA: Diagnosis not present

## 2023-12-02 DIAGNOSIS — Z7984 Long term (current) use of oral hypoglycemic drugs: Secondary | ICD-10-CM | POA: Diagnosis not present

## 2023-12-02 DIAGNOSIS — Z89421 Acquired absence of other right toe(s): Secondary | ICD-10-CM | POA: Insufficient documentation

## 2023-12-02 DIAGNOSIS — Z6837 Body mass index (BMI) 37.0-37.9, adult: Secondary | ICD-10-CM | POA: Diagnosis not present

## 2023-12-02 DIAGNOSIS — N3000 Acute cystitis without hematuria: Secondary | ICD-10-CM

## 2023-12-02 DIAGNOSIS — I10 Essential (primary) hypertension: Secondary | ICD-10-CM | POA: Diagnosis not present

## 2023-12-02 DIAGNOSIS — E1169 Type 2 diabetes mellitus with other specified complication: Secondary | ICD-10-CM | POA: Diagnosis present

## 2023-12-02 DIAGNOSIS — W19XXXA Unspecified fall, initial encounter: Principal | ICD-10-CM | POA: Diagnosis present

## 2023-12-02 DIAGNOSIS — Z8673 Personal history of transient ischemic attack (TIA), and cerebral infarction without residual deficits: Secondary | ICD-10-CM

## 2023-12-02 DIAGNOSIS — Z794 Long term (current) use of insulin: Secondary | ICD-10-CM | POA: Diagnosis not present

## 2023-12-02 DIAGNOSIS — R8281 Pyuria: Secondary | ICD-10-CM | POA: Insufficient documentation

## 2023-12-02 DIAGNOSIS — R531 Weakness: Principal | ICD-10-CM | POA: Insufficient documentation

## 2023-12-02 DIAGNOSIS — I69354 Hemiplegia and hemiparesis following cerebral infarction affecting left non-dominant side: Secondary | ICD-10-CM | POA: Insufficient documentation

## 2023-12-02 DIAGNOSIS — Z87891 Personal history of nicotine dependence: Secondary | ICD-10-CM | POA: Insufficient documentation

## 2023-12-02 LAB — CBG MONITORING, ED: Glucose-Capillary: 305 mg/dL — ABNORMAL HIGH (ref 70–99)

## 2023-12-02 NOTE — ED Triage Notes (Signed)
 Pt arrives EMS from home with reports of gorund level fall. Pt reports she was d/c from [facility] center today and has weakness to legs and is unable to ambulate well. Pt went to stand up and fell to the ground. Pt reports pain to right knee and left arm. Hx of CVA with left side deficits. Pt alert and oriented, denies hitting head.

## 2023-12-03 ENCOUNTER — Emergency Department (HOSPITAL_COMMUNITY)

## 2023-12-03 DIAGNOSIS — Z8673 Personal history of transient ischemic attack (TIA), and cerebral infarction without residual deficits: Secondary | ICD-10-CM | POA: Diagnosis not present

## 2023-12-03 DIAGNOSIS — E1169 Type 2 diabetes mellitus with other specified complication: Secondary | ICD-10-CM

## 2023-12-03 DIAGNOSIS — I10 Essential (primary) hypertension: Secondary | ICD-10-CM

## 2023-12-03 DIAGNOSIS — W19XXXA Unspecified fall, initial encounter: Secondary | ICD-10-CM | POA: Diagnosis not present

## 2023-12-03 DIAGNOSIS — E785 Hyperlipidemia, unspecified: Secondary | ICD-10-CM

## 2023-12-03 DIAGNOSIS — N390 Urinary tract infection, site not specified: Secondary | ICD-10-CM | POA: Insufficient documentation

## 2023-12-03 DIAGNOSIS — E66812 Obesity, class 2: Secondary | ICD-10-CM

## 2023-12-03 LAB — CBC WITH DIFFERENTIAL/PLATELET
Abs Immature Granulocytes: 0.06 10*3/uL (ref 0.00–0.07)
Basophils Absolute: 0 10*3/uL (ref 0.0–0.1)
Basophils Relative: 0 %
Eosinophils Absolute: 0.4 10*3/uL (ref 0.0–0.5)
Eosinophils Relative: 3 %
HCT: 28 % — ABNORMAL LOW (ref 36.0–46.0)
Hemoglobin: 8.3 g/dL — ABNORMAL LOW (ref 12.0–15.0)
Immature Granulocytes: 1 %
Lymphocytes Relative: 6 %
Lymphs Abs: 0.7 10*3/uL (ref 0.7–4.0)
MCH: 24.8 pg — ABNORMAL LOW (ref 26.0–34.0)
MCHC: 29.6 g/dL — ABNORMAL LOW (ref 30.0–36.0)
MCV: 83.6 fL (ref 80.0–100.0)
Monocytes Absolute: 0.7 10*3/uL (ref 0.1–1.0)
Monocytes Relative: 5 %
Neutro Abs: 10.9 10*3/uL — ABNORMAL HIGH (ref 1.7–7.7)
Neutrophils Relative %: 85 %
Platelets: 248 10*3/uL (ref 150–400)
RBC: 3.35 MIL/uL — ABNORMAL LOW (ref 3.87–5.11)
RDW: 14.6 % (ref 11.5–15.5)
WBC: 12.8 10*3/uL — ABNORMAL HIGH (ref 4.0–10.5)
nRBC: 0 % (ref 0.0–0.2)

## 2023-12-03 LAB — BASIC METABOLIC PANEL WITH GFR
Anion gap: 8 (ref 5–15)
BUN: 26 mg/dL — ABNORMAL HIGH (ref 8–23)
CO2: 23 mmol/L (ref 22–32)
Calcium: 8.5 mg/dL — ABNORMAL LOW (ref 8.9–10.3)
Chloride: 103 mmol/L (ref 98–111)
Creatinine, Ser: 1.37 mg/dL — ABNORMAL HIGH (ref 0.44–1.00)
GFR, Estimated: 43 mL/min — ABNORMAL LOW (ref >60)
Glucose, Bld: 309 mg/dL — ABNORMAL HIGH (ref 70–99)
Potassium: 5 mmol/L (ref 3.5–5.1)
Sodium: 134 mmol/L — ABNORMAL LOW (ref 135–145)

## 2023-12-03 LAB — URINALYSIS, W/ REFLEX TO CULTURE (INFECTION SUSPECTED)
Bilirubin Urine: NEGATIVE
Glucose, UA: >=500 mg/dL — AB
Hgb urine dipstick: NEGATIVE
Ketones, ur: NEGATIVE mg/dL
Nitrite: NEGATIVE
Protein, ur: 30 mg/dL — AB
Specific Gravity, Urine: 1.013 (ref 1.005–1.030)
pH: 5 (ref 5.0–8.0)

## 2023-12-03 LAB — GLUCOSE, CAPILLARY: Glucose-Capillary: 263 mg/dL — ABNORMAL HIGH (ref 70–99)

## 2023-12-03 LAB — CBG MONITORING, ED
Glucose-Capillary: 228 mg/dL — ABNORMAL HIGH (ref 70–99)
Glucose-Capillary: 303 mg/dL — ABNORMAL HIGH (ref 70–99)

## 2023-12-03 LAB — LACTIC ACID, PLASMA: Lactic Acid, Venous: 1.1 mmol/L (ref 0.5–1.9)

## 2023-12-03 MED ORDER — AMLODIPINE BESYLATE 5 MG PO TABS
10.0000 mg | ORAL_TABLET | Freq: Every day | ORAL | Status: DC
  Administered 2023-12-04 – 2023-12-07 (×4): 10 mg via ORAL
  Filled 2023-12-03 (×4): qty 2

## 2023-12-03 MED ORDER — ENOXAPARIN SODIUM 60 MG/0.6ML IJ SOSY
0.5000 mg/kg | PREFILLED_SYRINGE | INTRAMUSCULAR | Status: DC
  Administered 2023-12-03: 52.5 mg via SUBCUTANEOUS
  Filled 2023-12-03: qty 0.6

## 2023-12-03 MED ORDER — LIDOCAINE VISCOUS HCL 2 % MT SOLN
15.0000 mL | Freq: Once | OROMUCOSAL | Status: AC
  Administered 2023-12-03: 15 mL via ORAL
  Filled 2023-12-03: qty 15

## 2023-12-03 MED ORDER — ONDANSETRON HCL 4 MG PO TABS: 4.0000 mg | ORAL_TABLET | Freq: Four times a day (QID) | ORAL | Status: DC | PRN

## 2023-12-03 MED ORDER — ACETAMINOPHEN 650 MG RE SUPP: 650.0000 mg | Freq: Four times a day (QID) | RECTAL | Status: DC | PRN

## 2023-12-03 MED ORDER — ONDANSETRON HCL 4 MG/2ML IJ SOLN: 4.0000 mg | Freq: Four times a day (QID) | INTRAMUSCULAR | Status: DC | PRN

## 2023-12-03 MED ORDER — CEPHALEXIN 500 MG PO CAPS
500.0000 mg | ORAL_CAPSULE | Freq: Once | ORAL | Status: AC
  Administered 2023-12-03: 500 mg via ORAL
  Filled 2023-12-03: qty 1

## 2023-12-03 MED ORDER — RAMIPRIL 5 MG PO CAPS
10.0000 mg | ORAL_CAPSULE | Freq: Two times a day (BID) | ORAL | Status: DC
  Administered 2023-12-03 – 2023-12-07 (×8): 10 mg via ORAL
  Filled 2023-12-03 (×8): qty 2

## 2023-12-03 MED ORDER — SODIUM CHLORIDE 0.9 % IV SOLN
1.0000 g | Freq: Once | INTRAVENOUS | Status: AC
  Administered 2023-12-03: 1 g via INTRAVENOUS
  Filled 2023-12-03: qty 10

## 2023-12-03 MED ORDER — HYDROCODONE-ACETAMINOPHEN 5-325 MG PO TABS
2.0000 | ORAL_TABLET | Freq: Once | ORAL | Status: AC
  Administered 2023-12-03: 2 via ORAL
  Filled 2023-12-03: qty 2

## 2023-12-03 MED ORDER — LABETALOL HCL 200 MG PO TABS
200.0000 mg | ORAL_TABLET | Freq: Two times a day (BID) | ORAL | Status: DC
  Administered 2023-12-04 – 2023-12-07 (×7): 200 mg via ORAL
  Filled 2023-12-03 (×8): qty 1

## 2023-12-03 MED ORDER — FESOTERODINE FUMARATE ER 4 MG PO TB24
8.0000 mg | ORAL_TABLET | Freq: Every day | ORAL | Status: DC
  Administered 2023-12-04 – 2023-12-07 (×4): 8 mg via ORAL
  Filled 2023-12-03: qty 1
  Filled 2023-12-03 (×3): qty 2
  Filled 2023-12-03: qty 1

## 2023-12-03 MED ORDER — PANTOPRAZOLE SODIUM 40 MG PO TBEC
40.0000 mg | DELAYED_RELEASE_TABLET | Freq: Every day | ORAL | Status: DC
  Administered 2023-12-04 – 2023-12-07 (×4): 40 mg via ORAL
  Filled 2023-12-03 (×4): qty 1

## 2023-12-03 MED ORDER — SIMVASTATIN 20 MG PO TABS
40.0000 mg | ORAL_TABLET | Freq: Every day | ORAL | Status: DC
  Administered 2023-12-03 – 2023-12-06 (×4): 40 mg via ORAL
  Filled 2023-12-03 (×4): qty 2

## 2023-12-03 MED ORDER — ASPIRIN 325 MG PO TABS
325.0000 mg | ORAL_TABLET | Freq: Every day | ORAL | Status: DC
Start: 2023-12-04 — End: 2023-12-07
  Administered 2023-12-04 – 2023-12-07 (×4): 325 mg via ORAL
  Filled 2023-12-03 (×4): qty 1

## 2023-12-03 MED ORDER — FLUTICASONE PROPIONATE 50 MCG/ACT NA SUSP: 2.0000 | Freq: Every day | NASAL | Status: DC | PRN

## 2023-12-03 MED ORDER — ALUM & MAG HYDROXIDE-SIMETH 200-200-20 MG/5ML PO SUSP
30.0000 mL | Freq: Once | ORAL | Status: AC
  Administered 2023-12-03: 30 mL via ORAL
  Filled 2023-12-03: qty 30

## 2023-12-03 MED ORDER — PREGABALIN 50 MG PO CAPS
50.0000 mg | ORAL_CAPSULE | Freq: Two times a day (BID) | ORAL | Status: DC
  Administered 2023-12-03 – 2023-12-07 (×8): 50 mg via ORAL
  Filled 2023-12-03 (×8): qty 1

## 2023-12-03 MED ORDER — ACETAMINOPHEN 325 MG PO TABS
650.0000 mg | ORAL_TABLET | Freq: Four times a day (QID) | ORAL | Status: DC | PRN
  Administered 2023-12-04: 650 mg via ORAL
  Filled 2023-12-03: qty 2

## 2023-12-03 MED ORDER — INSULIN ASPART 100 UNIT/ML IJ SOLN
0.0000 [IU] | Freq: Three times a day (TID) | INTRAMUSCULAR | Status: DC
  Administered 2023-12-04: 11 [IU] via SUBCUTANEOUS
  Administered 2023-12-04: 3 [IU] via SUBCUTANEOUS
  Administered 2023-12-04 – 2023-12-05 (×2): 5 [IU] via SUBCUTANEOUS
  Administered 2023-12-05: 3 [IU] via SUBCUTANEOUS
  Administered 2023-12-06: 5 [IU] via SUBCUTANEOUS
  Administered 2023-12-06: 3 [IU] via SUBCUTANEOUS
  Administered 2023-12-06: 8 [IU] via SUBCUTANEOUS
  Administered 2023-12-07: 3 [IU] via SUBCUTANEOUS

## 2023-12-03 MED ORDER — SODIUM CHLORIDE 0.9 % IV SOLN: 1.0000 g | Freq: Three times a day (TID) | INTRAVENOUS | Status: DC

## 2023-12-03 MED ORDER — INSULIN GLARGINE-YFGN 100 UNIT/ML ~~LOC~~ SOLN
20.0000 [IU] | Freq: Every day | SUBCUTANEOUS | Status: DC
  Administered 2023-12-04: 20 [IU] via SUBCUTANEOUS
  Filled 2023-12-03 (×2): qty 0.2

## 2023-12-03 MED ORDER — SODIUM CHLORIDE 0.9 % IV BOLUS
500.0000 mL | Freq: Once | INTRAVENOUS | Status: AC
  Administered 2023-12-03: 500 mL via INTRAVENOUS

## 2023-12-03 NOTE — Assessment & Plan Note (Addendum)
 Continue glucose cover and monitoring with insulin  sliding scale.  Will resume basal insulin  at a lower dose to avoid hypoglycemia.  Hold on metformin  for now.   Continue statin.   Diabetic neuropathy, continue pregabalin 

## 2023-12-03 NOTE — ED Notes (Signed)
 Round check is complete at this time. Pt is resting with no s/s of distress. Recliner was provided for the pt's husband.

## 2023-12-03 NOTE — ED Provider Notes (Signed)
 [place] [facility]ERGENCY DEPARTMENT AT [facility] Provider Note   CSN: [id number] Arrival date & time: 12/02/23  2336     History  Chief Complaint  Patient presents with   Melanie Spencer is a 63 y.o. female.  Patient is a 63 year old female with history of prior CVA in 2007, diabetes, hypertension, obesity.  She also had a recent admission for urosepsis and was subsequently discharged to an extended care facility for rehab.  Patient was discharged from the rehab facility earlier today.  This evening, patient was attempting to ambulate from the couch to the bedside commode when she apparently fell and was too weak to get up.  EMS was called and patient was transported here.  Patient denies significant injury from the fall, but does describe some discomfort to her left knee.  She denies having struck her head, loss consciousness.  No neck pain, no chest pain, or difficulty breathing.       Home Medications Prior to Admission medications   Medication Sig Start Date End Date Taking? Authorizing Provider  amLODipine  (NORVASC ) 10 MG tablet Take 1 tablet (10 mg total) by mouth daily. 11/30/23   Marilyne Shu, NP  aspirin  325 MG tablet Take 325 mg by mouth daily.    [provider]  cholecalciferol (VITAMIN D ) 1000 UNITS tablet Take 1,000 Units by mouth daily.    [provider]  Dulaglutide  (TRULICITY ) 4.5 MG/0.5ML SOAJ Inject 4.5 mg as directed once a week. Sundays 11/30/23   Marilyne Shu, NP  fesoterodine  (TOVIAZ ) 8 MG TB24 tablet Take 1 tablet (8 mg total) by mouth daily. 11/30/23   Marilyne Shu, NP  fluticasone  (FLONASE ) 50 MCG/ACT nasal spray SHAKE LIQUID AND USE 2 SPRAYS IN EACH NOSTRIL EVERY DAY Patient taking differently: Place 2 sprays into both nostrils daily as needed for allergies. 04/17/21 11/30/23  Zorita Hiss, NP  glucose blood (ACCU-CHEK GUIDE) test strip TEST FOUR TIMES A DAY 12/27/20   Heloise Lobo C, MD  Insulin  Pen Needle 29G X 12.7MM  MISC 1 Units by Does not apply route daily at 12 noon. 12/02/20   Gosrani, Nimish C, MD  labetalol  (NORMODYNE ) 200 MG tablet Take 1 tablet (200 mg total) by mouth 2 (two) times daily. 11/30/23   Marilyne Shu, NP  LANTUS  SOLOSTAR 100 UNIT/ML Solostar Pen ADMINISTER 40 UNITS UNDER THE SKIN AT BEDTIME 11/30/23   Marilyne Shu, NP  lidocaine  (LIDODERM ) 5 % Place 1 patch onto the skin daily. Remove & Discard patch within 12 hours or as directed by MD 11/30/23   Marilyne Shu, NP  magnesium  oxide (MAG-OX) 400 (240 Mg) MG tablet Take 1 tablet (400 mg total) by mouth 2 (two) times daily. 11/30/23   Marilyne Shu, NP  Nystatin  (GERHARDT'S BUTT CREAM) CREA Apply 1 Application topically daily. 11/08/23   Shahmehdi, Seyed A, MD  omeprazole (PRILOSEC) 20 MG capsule Take 1 capsule by mouth daily. 07/03/21   [provider]  pregabalin  (LYRICA ) 50 MG capsule Take 1 capsule (50 mg total) by mouth 2 (two) times daily. 11/30/23   Marilyne Shu, NP  simvastatin  (ZOCOR ) 40 MG tablet Take 1 tablet (40 mg total) by mouth at bedtime. 11/30/23   Marilyne Shu, NP  vitamin B-12 (VITAMIN B12) 500 MCG tablet Take 1 tablet (500 mcg total) by mouth daily. Patient taking differently: Take 1,000 mcg by mouth daily. 11/08/23   Bobbetta Burnet, MD  Allergies    Patient has no known allergies.    Review of Systems   Review of Systems  All other systems reviewed and are negative.   Physical Exam Updated Vital Signs BP (!) 146/61   Pulse 77   Temp 98.9 F (37.2 C) (Oral)   Resp 19   LMP 03/02/2012   SpO2 96%  Physical Exam Vitals and nursing note reviewed.  Constitutional:      General: She is not in acute distress.    Appearance: She is well-developed. She is not diaphoretic.  HENT:     Head: Normocephalic and atraumatic.  Eyes:     Extraocular Movements: Extraocular movements intact.     Pupils: Pupils are equal, round, and reactive to light.  Cardiovascular:     Rate and Rhythm: Normal  rate and regular rhythm.     Heart sounds: No murmur heard.    No friction rub. No gallop.  Pulmonary:     Effort: Pulmonary effort is normal. No respiratory distress.     Breath sounds: Normal breath sounds. No wheezing.  Abdominal:     General: Bowel sounds are normal. There is no distension.     Palpations: Abdomen is soft.     Tenderness: There is no abdominal tenderness.  Musculoskeletal:        General: Normal range of motion.     Cervical back: Normal range of motion and neck supple.     Right lower leg: Edema present.     Left lower leg: Edema present.  Skin:    General: Skin is warm and dry.  Neurological:     General: No focal deficit present.     Mental Status: She is alert and oriented to person, place, and time.     Cranial Nerves: No cranial nerve deficit.     ED Results / Procedures / Treatments   Labs (all labs ordered are listed, but only abnormal results are displayed) Labs Reviewed  CBG MONITORING, ED - Abnormal; Notable for the following components:      Result Value   Glucose-Capillary 305 (*)    All other components within normal limits    EKG EKG Interpretation Date/Time:  Saturday Dec 03 2023 00:14:33 EDT Ventricular Rate:  76 PR Interval:  154 QRS Duration:  106 QT Interval:  407 QTC Calculation: 458 R Axis:   -6  Text Interpretation: Sinus rhythm Probable LVH with secondary repol abnrm Confirmed by Orvilla Blander ([id number]) on 12/03/2023 12:34:25 AM  Radiology No results found.  Procedures Procedures    Medications Ordered in ED Medications  sodium chloride  0.9 % bolus 500 mL (has no administration in time range)    ED Course/ Medical Decision Making/ A&P  Patient is a 63 year old female with extensive past medical history as per HPI.  Patient presenting with weakness and fall.  She was just discharged from her rehab facility today after a recent admission to the hospital for urosepsis.  Patient arrives here with stable vital signs and  is afebrile.  She complains of pain in the knees, but has has no other complaints.  Laboratory studies obtained including CBC, BMP, lactate and UA.She has a slight leukocytosis with WBC of 12.8, but studies basically unremarkable otherwise.  Lactate is normal at 1.1.  Urinalysis somewhat suggestive of a UTI.  X-rays of both knees obtained showing no acute fracture.  There is a chronic appearing deformity along the medial aspect of the right distal right femur on the  right knee, but I believe this to be chronic.  Chest x-ray is clear.  Patient hydrated with normal saline.  She was also given hydrocodone  for pain and Rocephin  for UTI.  In discussion with patient and her husband, he does not feel as though she is safe to be at home.  She has only been home a few hours and has already fallen once and he is uncomfortable with returning her there.  Patient will remain in the emergency department and undergo TOC consultation in the morning.  Final Clinical Impression(s) / ED Diagnoses Final diagnoses:  None    Rx / DC Orders ED Discharge Orders     None         Orvilla Blander, MD 12/03/23 [phone]

## 2023-12-03 NOTE — Assessment & Plan Note (Signed)
Calculated BMI is 37,9

## 2023-12-03 NOTE — ED Notes (Signed)
Hospitalist at bedside,

## 2023-12-03 NOTE — Assessment & Plan Note (Signed)
 Continue blood pressure control with ramipril , labetalol  and amlodipine 

## 2023-12-03 NOTE — ED Notes (Signed)
Round check is complete at this time. Pt is resting with no s/s of distress.

## 2023-12-03 NOTE — H&P (Signed)
 History and Physical    Patient: Melanie Spencer QMV:[id number] DOB: [birth year] DOA: 12/02/2023 DOS: the patient was seen and examined on 12/03/2023 PCP: H[facility] Mackint[facility]  Patient coming from: Home  Chief Complaint:  Chief Complaint  Patient presents with   Fall   HPI: Melanie Spencer is a 63 y.o. female with medical history significant of prior CVA with left hemiparesis, hypertension, T2DM, hyperlipidemia, recurrent urinary tract infections and obesity class 3 who presented after a mechanical fall.  Recent hospitalization 10/29/23 to 11/08/23 due to severe urinary tract infection complicated with septic shock and metabolic encephalopathy.  She was treated with linezolid  and meropenem . She was discharged to SNF, by then she had completed her antibiotic therapy.  05/09 she discharged from SNF to home. Her husband noted her to be very weak and not yet able to ambulate independently.   The day prior she was noted to feel hot but not frank fevers or chills, denied any dysuria, increased urinary frequency or pelvic abdominal pain.  The night of 05/09 she was trying to reach her beside commode, when her legs gave up and she fell, landing on her buttocks, no head trauma or loss of consciousness.  Because severe bilateral knee pain post fall, her husband called EMS and she was transported to the ED.   At the time of my examination her knee pain has improved with analgesics, she continue very weak and deconditioned.   Review of Systems: As mentioned in the history of present illness. All other systems reviewed and are negative. Past Medical History:  Diagnosis Date   Cerebrovascular disease 05/23/2019   Essential hypertension, benign 05/23/2019   History of stroke 2007   left leg weakness   HLD (hyperlipidemia) 05/23/2019   Morbid obesity (HCC) 05/23/2019   Type II diabetes mellitus, uncontrolled 05/23/2019   UTI (urinary tract infection)    Vitamin D  deficiency    Past Surgical History:   Procedure Laterality Date   AMPUTATION Right 11/09/2019   Procedure: 5TH AMPUTATION RAY;  Surgeon: Charity Conch, DPM;  Location: [facility] OR;  Service: Podiatry;  Laterality: Right;   CHOLECYSTECTOMY     COLONOSCOPY N/A 11/12/2020   Procedure: COLONOSCOPY;  Surgeon: Suzette Espy, MD;  Location: [facility] SU[facility];  Service: Endoscopy;  Laterality: N/A;  ASA III / PM procedure   POLYPECTOMY  11/12/2020   Procedure: POLYPECTOMY;  Surgeon: Suzette Espy, MD;  Location: [facility];  Service: Endoscopy;;   Social History:  reports that she quit smoking about 18 years ago. Her smoking use included cigarettes. She started smoking about 38 years ago. She has a 40 pack-year smoking history. She has never used smokeless tobacco. She reports that she does not drink alcohol and does not use drugs.  No Known Allergies  Family History  Problem Relation Age of Onset   Heart disease Mother    Diabetes Mother    Hypertension Mother    Cancer Father        Lung   Hypertension Sister    Macular degeneration Sister    Appendicitis Brother    Hypertension Sister    Diabetes Sister     Prior to Admission medications   Medication Sig Start Date End Date Taking? Authorizing Provider  amLODipine  (NORVASC ) 10 MG tablet Take 1 tablet (10 mg total) by mouth daily. 11/30/23  Yes Marilyne Shu, NP  aspirin  325 MG tablet Take 325 mg by mouth daily.   Yes [provider]  cholecalciferol (VITAMIN D ) 1000 UNITS tablet Take 1,000 Units by mouth daily.   Yes [provider]  Dulaglutide  (TRULICITY ) 4.5 MG/0.5ML SOAJ Inject 4.5 mg as directed once a week. Sundays Patient taking differently: Inject 4.5 mg into the skin every Sunday. Evening 11/30/23  Yes Marilyne Shu, NP  fesoterodine  (TOVIAZ ) 8 MG TB24 tablet Take 1 tablet (8 mg total) by mouth daily. 11/30/23  Yes Marilyne Shu, NP  fluticasone  (FLONASE ) 50 MCG/ACT nasal spray SHAKE LIQUID AND USE 2 SPRAYS IN EACH NOSTRIL EVERY DAY Patient  taking differently: Place 2 sprays into both nostrils daily as needed for allergies. 04/17/21 12/03/23 Yes Zorita Hiss, NP  labetalol  (NORMODYNE ) 200 MG tablet Take 1 tablet (200 mg total) by mouth 2 (two) times daily. 11/30/23  Yes Marilyne Shu, NP  LANTUS  SOLOSTAR 100 UNIT/ML Solostar Pen ADMINISTER 40 UNITS UNDER THE SKIN AT BEDTIME Patient taking differently: Inject 40 Units into the skin at bedtime. 11/30/23  Yes Marilyne Shu, NP  lidocaine  (LIDODERM ) 5 % Place 1 patch onto the skin daily. Remove & Discard patch within 12 hours or as directed by MD Patient taking differently: Place 1 patch onto the skin daily. Remove & Discard patch within 12 hours or as directed by MD *FOR LEFT ARM 11/30/23  Yes Marilyne Shu, NP  magnesium  oxide (MAG-OX) 400 (240 Mg) MG tablet Take 1 tablet (400 mg total) by mouth 2 (two) times daily. 11/30/23  Yes Marilyne Shu, NP  metFORMIN  (GLUCOPHAGE ) 1000 MG tablet Take 1,000 mg by mouth 2 (two) times daily with a meal.   Yes [provider]  Nystatin  (GERHARDT'S BUTT CREAM) CREA Apply 1 Application topically daily. Patient taking differently: Apply 1 Application topically daily. For rash on lower extremities 11/08/23  Yes Shahmehdi, Seyed A, MD  omeprazole (PRILOSEC) 20 MG capsule Take 20 mg by mouth daily. 07/03/21  Yes [provider]  pregabalin  (LYRICA ) 50 MG capsule Take 1 capsule (50 mg total) by mouth 2 (two) times daily. 11/30/23  Yes Marilyne Shu, NP  ramipril  (ALTACE ) 10 MG capsule Take 10 mg by mouth 2 (two) times daily.   Yes [provider]  simvastatin  (ZOCOR ) 40 MG tablet Take 1 tablet (40 mg total) by mouth at bedtime. 11/30/23  Yes Marilyne Shu, NP    Physical Exam: Vitals:   12/03/23 1900 12/03/23 2000 12/03/23 2009 12/03/23 2128  BP: (!) 156/63 (!) 154/65  (!) 168/66  Pulse:    77  Resp:  20  20  Temp:   98.7 F (37.1 C) 99 F (37.2 C)  TempSrc:   Oral Oral  SpO2:    96%  Weight:    105 kg   Neurology  awake and alert, deconditioned, left sided hemiparesis (chronic) ENT with mild pallor Cardiovascular with S1 and S2 present and regular with no gallops rubs or murmurs Respiratory with no rales or wheezing, no rhonchi  Abdomen with no distention  Positive non pitting mild lower extremity edema.   Data Reviewed:   Na 134, K 5.0 Cl 103 bicarbonate 23 glucose 309 bun 26 cr 1,37  Lactic acid 1.1  Wbc 12.8 hgb 8.3 plt 246   Urine analysis SG 1,013, protein 30, glucose >500, leukocytes small, hgb negative, 21-50 wbc   Chest radiograph with hypoinflation with cardiomegaly with no infiltrates or effusions.   EKG 76 bpm, left axis deviation, normal intervals, qtc 458, sinus rhythm with no significant ST segment or T wave  changes.   Left knee radiograph with tricompartmental degenerative changes.  Very small suprapatellar effusion  Right knee radiograph with no acute fracture or dislocation.  Chronic appearing deformity along the medial aspect of the distal right femur.  Tricompartmental degenerative changes.   Assessment and Plan: * Fall Patient with chronic ambulatory dysfunction.  Recent hospitalization for septic shock Knees radiographs with no acute fractures.  Consult PT and OT.  She has been discharged from SNF within last 24  hrs.   Her urine analysis has mild pyuria with 21-50 wbc, she has not urinary symptoms and no fever.  Her wbc is mildly elevated.  She has received antibiotic in the ED, ceftriaxone , cephalexin  and meropenem . 2024 urine culture with Klebsiella pneumonia resistant to 1st, 2nd and 3rd generation cephalosporins.  Will hold on further antibiotic therapy for now and follow up repeat urine culture from today, follow up cell count and temperature curve.   Essential hypertension, benign Continue blood pressure control with ramipril , labetalol  and amlodipine    Type 2 diabetes mellitus with hyperlipidemia (HCC) Continue glucose cover and monitoring with insulin   sliding scale.  Will resume basal insulin  at a lower dose to avoid hypoglycemia.  Hold on metformin  for now.   Continue statin.   Diabetic neuropathy, continue pregabalin   History of CVA (cerebrovascular accident) Continue blood pressure control, aspirin  and statin   Obesity, class 2 Calculated BMI is 37,9     Advance Care Planning:   Code Status: Full Code   Consults: none   Family Communication: I spoke with patient's husband at the bedside, we talked in detail about patient's condition, plan of care and prognosis and all questions were addressed.   Severity of Illness: The appropriate patient status for this patient is OBSERVATION. Observation status is judged to be reasonable and necessary in order to provide the required intensity of service to ensure the patient's safety. The patient's presenting symptoms, physical exam findings, and initial radiographic and laboratory data in the context of their medical condition is felt to place them at decreased risk for further clinical deterioration. Furthermore, it is anticipated that the patient will be medically stable for discharge from the hospital within 2 midnights of admission.   Author: Albertus Alt, MD 12/03/2023 9:45 PM  For on call review [email or web address].

## 2023-12-03 NOTE — ED Notes (Signed)
 Emptied Pt urine from [facility] - 

## 2023-12-03 NOTE — Assessment & Plan Note (Addendum)
 Patient with chronic ambulatory dysfunction.  Recent hospitalization for septic shock Knees radiographs with no acute fractures.  Consult PT and OT.  She has been discharged from SNF within last 24  hrs.   Her urine analysis has mild pyuria with 21-50 wbc, she has not urinary symptoms and no fever.  Her wbc is mildly elevated.  She has received antibiotic in the ED, ceftriaxone , cephalexin  and meropenem . 2024 urine culture with Klebsiella pneumonia resistant to 1st, 2nd and 3rd generation cephalosporins.  Will hold on further antibiotic therapy for now and follow up repeat urine culture from today, follow up cell count and temperature curve.

## 2023-12-03 NOTE — Assessment & Plan Note (Signed)
 Continue blood pressure control, aspirin  and statin

## 2023-12-03 NOTE — ED Notes (Addendum)
 ED TO INPATIENT HANDOFF REPORT  ED Nurse Name and Phone #:   S Name/Age/Gender Melanie Spencer 63 y.o. female Room/Bed: APA07/APA07  Code Status   Code Status: Full Code  Home/SNF/Other Skilled nursing facility Patient oriented to: self, place, time, and situation Is this baseline? Yes   Triage Complete: Triage complete  Chief Complaint Complicated UTI (urinary tract infection) [N39.0]  Triage Note Pt arrives EMS from home with reports of gorund level fall. Pt reports she was d/c from [facility] center today and has weakness to legs and is unable to ambulate well. Pt went to stand up and fell to the ground. Pt reports pain to right knee and left arm. Hx of CVA with left side deficits. Pt alert and oriented, denies hitting head.    Allergies No Known Allergies  Level of Care/Admitting Diagnosis ED Disposition     ED Disposition  Admit   Condition  --   Comment  Hospital Area: [facility] [[id number]]  Level of Care: Med-Surg [16]  Covid Evaluation: Asymptomatic - no recent exposure (last 10 days) testing not required  Diagnosis: Complicated UTI (urinary tract infection) [161096]  Admitting Physician: ARRIEN, MAURICIO DANIEL [[id number]]  Attending Physician: ARRIEN, MAURICIO DANIEL [[id number]]  Certification:: I certify this patient will need inpatient services for at least 2 midnights  Expected Medical Readiness: 12/05/2023          B Medical/Surgery History Past Medical History:  Diagnosis Date   Cerebrovascular disease 05/23/2019   Essential hypertension, benign 05/23/2019   History of stroke 2007   left leg weakness   HLD (hyperlipidemia) 05/23/2019   Morbid obesity (HCC) 05/23/2019   Type II diabetes mellitus, uncontrolled 05/23/2019   UTI (urinary tract infection)    Vitamin D  deficiency    Past Surgical History:  Procedure Laterality Date   AMPUTATION Right 11/09/2019   Procedure: 5TH AMPUTATION RAY;  Surgeon: Charity Conch, DPM;  Location: [facility] OR;   Service: Podiatry;  Laterality: Right;   CHOLECYSTECTOMY     COLONOSCOPY N/A 11/12/2020   Procedure: COLONOSCOPY;  Surgeon: Suzette Espy, MD;  Location: [facility] ENDO SUITE;  Service: Endoscopy;  Laterality: N/A;  ASA III / PM procedure   POLYPECTOMY  11/12/2020   Procedure: POLYPECTOMY;  Surgeon: Suzette Espy, MD;  Location: [facility];  Service: Endoscopy;;     A IV Location/Drains/Wounds Patient Lines/Drains/Airways Status     Active Line/Drains/Airways     Name Placement date Placement time Site Days   Peripheral IV 12/03/23 20 G Right Antecubital 12/03/23  0003  Antecubital  less than 1   External Urinary Catheter 12/03/23  0220  --  less than 1   Wound / Incision (Open or Dehisced) 11/09/19 Diabetic ulcer Toe (Comment  which one) Right 11/09/19  1436  Toe (Comment  which one)  1485            Intake/Output Last 24 hours  Intake/Output Summary (Last 24 hours) at 12/03/2023 2025 Last data filed at 12/03/2023 2006 Gross per 24 hour  Intake --  Output 850 ml  Net -850 ml    Labs/Imaging Results for orders placed or performed during the hospital encounter of 12/02/23 (from the past 48 hours)  CBG monitoring, ED     Status: Abnormal   Collection Time: 12/02/23 11:43 PM  Result Value Ref Range   Glucose-Capillary 305 (H) 70 - 99 mg/dL    Comment: Glucose reference range applies only to samples taken after fasting for at  least 8 hours.  Basic metabolic panel     Status: Abnormal   Collection Time: 12/03/23 12:19 AM  Result Value Ref Range   Sodium 134 (L) 135 - 145 mmol/L   Potassium 5.0 3.5 - 5.1 mmol/L   Chloride 103 98 - 111 mmol/L   CO2 23 22 - 32 mmol/L   Glucose, Bld 309 (H) 70 - 99 mg/dL    Comment: Glucose reference range applies only to samples taken after fasting for at least 8 hours.   BUN 26 (H) 8 - 23 mg/dL   Creatinine, Ser 1.91 (H) 0.44 - 1.00 mg/dL   Calcium 8.5 (L) 8.9 - 10.3 mg/dL   GFR, Estimated 43 (L) >60 mL/min    Comment: (NOTE) Calculated  using the CKD-EPI Creatinine Equation (2021)    Anion gap 8 5 - 15    Comment: Performed at [facility], [address], [place], [place] [address]  CBC with Differential     Status: Abnormal   Collection Time: 12/03/23 12:19 AM  Result Value Ref Range   WBC 12.8 (H) 4.0 - 10.5 K/uL   RBC 3.35 (L) 3.87 - 5.11 MIL/uL   Hemoglobin 8.3 (L) 12.0 - 15.0 g/dL   HCT 56.2 (L) 13.0 - 86.5 %   MCV 83.6 80.0 - 100.0 fL   MCH 24.8 (L) 26.0 - 34.0 pg   MCHC 29.6 (L) 30.0 - 36.0 g/dL   RDW 78.4 69.6 - 29.5 %   Platelets 248 150 - 400 K/uL   nRBC 0.0 0.0 - 0.2 %   Neutrophils Relative % 85 %   Neutro Abs 10.9 (H) 1.7 - 7.7 K/uL   Lymphocytes Relative 6 %   Lymphs Abs 0.7 0.7 - 4.0 K/uL   Monocytes Relative 5 %   Monocytes Absolute 0.7 0.1 - 1.0 K/uL   Eosinophils Relative 3 %   Eosinophils Absolute 0.4 0.0 - 0.5 K/uL   Basophils Relative 0 %   Basophils Absolute 0.0 0.0 - 0.1 K/uL   Immature Granulocytes 1 %   Abs Immature Granulocytes 0.06 0.00 - 0.07 K/uL    Comment: Performed at [facility], [address], [place], [place] [address]  Lactic acid, plasma     Status: None   Collection Time: 12/03/23 12:19 AM  Result Value Ref Range   Lactic Acid, Venous 1.1 0.5 - 1.9 mmol/L    Comment: Performed at [facility], [address], [place], [place] [address]  Urinalysis, w/ Reflex to Culture (Infection Suspected) -Urine, Clean Catch     Status: Abnormal   Collection Time: 12/03/23  3:29 AM  Result Value Ref Range   Specimen Source URINE, CLEAN CATCH    Color, Urine YELLOW YELLOW   APPearance HAZY (A) CLEAR   Specific Gravity, Urine 1.013 1.005 - 1.030   pH 5.0 5.0 - 8.0   Glucose, UA >=500 (A) NEGATIVE mg/dL   Hgb urine dipstick NEGATIVE NEGATIVE   Bilirubin Urine NEGATIVE NEGATIVE   Ketones, ur NEGATIVE NEGATIVE mg/dL   Protein, ur 30 (A) NEGATIVE mg/dL   Nitrite NEGATIVE NEGATIVE   Leukocytes,Ua SMALL (A) NEGATIVE   RBC / HPF 0-5 0 - 5 RBC/hpf   WBC, UA 21-50 0 - 5 WBC/hpf     Comment:        Reflex urine culture not performed if WBC <=10, OR if Squamous epithelial cells >5. If Squamous epithelial cells >5 suggest recollection.    Bacteria, UA MANY (A) NONE SEEN   Squamous  Epithelial / HPF 0-5 0 - 5 /HPF    Comment: Performed at [facility], [address], [place], [place] [address]  CBG monitoring, ED     Status: Abnormal   Collection Time: 12/03/23  8:05 AM  Result Value Ref Range   Glucose-Capillary 228 (H) 70 - 99 mg/dL    Comment: Glucose reference range applies only to samples taken after fasting for at least 8 hours.  CBG monitoring, ED     Status: Abnormal   Collection Time: 12/03/23  2:05 PM  Result Value Ref Range   Glucose-Capillary 303 (H) 70 - 99 mg/dL    Comment: Glucose reference range applies only to samples taken after fasting for at least 8 hours.   DG Knee Complete 4 Views Right Result Date: 12/03/2023 CLINICAL DATA:  Ground level fall. EXAM: RIGHT KNEE - COMPLETE 4+ VIEW COMPARISON:  None Available. FINDINGS: No evidence of acute fracture or dislocation. A chronic appearing deformity is seen along the medial aspect of the distal right femur, at the level of the medial femoral condyle. Tricompartmental degenerative changes are present. A very small suprapatellar effusion is suspected. IMPRESSION: 1. No acute fracture or dislocation. 2. Chronic appearing deformity along the medial aspect of the distal right femur. Correlation with physical examination is recommended to determine the presence of point tenderness. 3. Tricompartmental degenerative changes. Electronically Signed   By: Virgle Grime M.D.   On: 12/03/2023 03:05   DG Knee Complete 4 Views Left Result Date: 12/03/2023 CLINICAL DATA:  Ground level fall. EXAM: LEFT KNEE - COMPLETE 4+ VIEW COMPARISON:  None Available. FINDINGS: No evidence of an acute fracture or dislocation. Tricompartmental degenerative changes are noted. A very small suprapatellar effusion is suspected. IMPRESSION:  1. Tricompartmental degenerative changes. 2. Very small suprapatellar effusion. Electronically Signed   By: Virgle Grime M.D.   On: 12/03/2023 03:05   DG Chest Port 1 View Result Date: 12/03/2023 CLINICAL DATA:  Ground level fall. EXAM: PORTABLE CHEST 1 VIEW COMPARISON:  October 30, 2023 FINDINGS: The cardiac silhouette is mildly enlarged and unchanged in size. Mild atelectasis is seen within the left lung base. No pleural effusion or pneumothorax is identified. Chronic and degenerative changes are seen involving the left shoulder. IMPRESSION: Mild left basilar atelectasis. Electronically Signed   By: Virgle Grime M.D.   On: 12/03/2023 03:01    Pending Labs Unresulted Labs (From admission, onward)     Start     Ordered   12/10/23 0500  Creatinine, serum  (enoxaparin  (LOVENOX )    CrCl >/= 30 ml/min)  Weekly,   R     Comments: while on enoxaparin  therapy    12/03/23 2018   12/04/23 0500  Basic metabolic panel  Tomorrow morning,   R        12/03/23 2018   12/04/23 0500  CBC  Tomorrow morning,   R        12/03/23 2018   12/03/23 2017  CBC  (enoxaparin  (LOVENOX )    CrCl >/= 30 ml/min)  Once,   R       Comments: Baseline for enoxaparin  therapy IF NOT ALREADY DRAWN.  Notify MD if PLT < 100 K.    12/03/23 2018   12/03/23 2017  Creatinine, serum  (enoxaparin  (LOVENOX )    CrCl >/= 30 ml/min)  Once,   R       Comments: Baseline for enoxaparin  therapy IF NOT ALREADY DRAWN.    12/03/23 2018   12/03/23 0329  Urine  Culture  Once,   R        12/03/23 0329            Vitals/Pain Today's Vitals   12/03/23 1500 12/03/23 1900 12/03/23 2000 12/03/23 2009  BP: 136/60 (!) 156/63 (!) 154/65   Pulse:      Resp: 17  20   Temp:    98.7 F (37.1 C)  TempSrc:    Oral  SpO2:      PainSc:        Isolation Precautions No active isolations  Medications Medications  meropenem  (MERREM ) 1 g in sodium chloride  0.9 % 100 mL IVPB (has no administration in time range)  enoxaparin  (LOVENOX )  injection 52.5 mg (has no administration in time range)  acetaminophen  (TYLENOL ) tablet 650 mg (has no administration in time range)    Or  acetaminophen  (TYLENOL ) suppository 650 mg (has no administration in time range)  ondansetron  (ZOFRAN ) tablet 4 mg (has no administration in time range)    Or  ondansetron  (ZOFRAN ) injection 4 mg (has no administration in time range)  sodium chloride  0.9 % bolus 500 mL (0 mLs Intravenous Stopping previously hung infusion 12/03/23 0220)  alum & mag hydroxide-simeth (MAALOX/MYLANTA) 200-200-20 MG/5ML suspension 30 mL (30 mLs Oral Given 12/03/23 0146)    And  lidocaine  (XYLOCAINE ) 2 % viscous mouth solution 15 mL (15 mLs Oral Given 12/03/23 0146)  HYDROcodone -acetaminophen  (NORCO/VICODIN) 5-325 MG per tablet 2 tablet (2 tablets Oral Given 12/03/23 0320)  cefTRIAXone  (ROCEPHIN ) 1 g in sodium chloride  0.9 % 100 mL IVPB (0 g Intravenous Stopped 12/03/23 0630)  cephALEXin  (KEFLEX ) capsule 500 mg (500 mg Oral Given 12/03/23 1548)    Mobility walks with device     Focused Assessments             R Recommendations: See Admitting Provider Note  Report given to:   Additional Notes:

## 2023-12-03 NOTE — ED Notes (Signed)
 Took out breakfast tray from pt room. Pt family member at bedside. Pt states no needs at this time.

## 2023-12-03 NOTE — ED Notes (Signed)
 X-Ray at bedside.

## 2023-12-03 NOTE — ED Notes (Signed)
 Spoke with pt and spouse who remains at bedside, spouse reports that he was under the impression that pt was being admitted due to UTI, RN spoke with Dr Vira Grieves who advised that pt would need admission for UTI.  Pt has redness noted to bilateral lower extremities, skin warm to touch, spouse and pt report that it is normal for pt to have redness but her legs are usually cool to touch,

## 2023-12-03 NOTE — Progress Notes (Signed)
 PHARMACIST - PHYSICIAN COMMUNICATION  CONCERNING:  Enoxaparin  (Lovenox ) for DVT Prophylaxis   ASSESSMENT: Patient was prescribed enoxaparin  40 mg subcutaneously every 24 hours for VTE prophylaxis.   Wt 106.6 kg, Ht 166.4 cm, BMI 38.51 kg/m^2  Estimated Creatinine Clearance: 51.5 mL/min (A) (by C-G formula based on SCr of 1.37 mg/dL (H)).  Based on [facility] policy, patient qualifies for enoxaparin  dosing of 0.5 mg per kilogram of total body weight every 24 hours because their body mass index is >30 kg/m2.  PLAN: Pharmacy has adjusted enoxaparin  dose per [facility] policy.  Description: Patient is now receiving enoxaparin  0.5 mg/kg subcutaneously every 24 hours.  Will M. Alva Jewels, PharmD Clinical Pharmacist 12/03/2023 8:20 PM

## 2023-12-04 DIAGNOSIS — Z8673 Personal history of transient ischemic attack (TIA), and cerebral infarction without residual deficits: Secondary | ICD-10-CM | POA: Diagnosis not present

## 2023-12-04 DIAGNOSIS — W19XXXA Unspecified fall, initial encounter: Secondary | ICD-10-CM | POA: Diagnosis not present

## 2023-12-04 DIAGNOSIS — E1169 Type 2 diabetes mellitus with other specified complication: Secondary | ICD-10-CM | POA: Diagnosis not present

## 2023-12-04 DIAGNOSIS — I10 Essential (primary) hypertension: Secondary | ICD-10-CM | POA: Diagnosis not present

## 2023-12-04 LAB — GLUCOSE, CAPILLARY
Glucose-Capillary: 228 mg/dL — ABNORMAL HIGH (ref 70–99)
Glucose-Capillary: 302 mg/dL — ABNORMAL HIGH (ref 70–99)
Glucose-Capillary: 183 mg/dL — ABNORMAL HIGH (ref 70–99)
Glucose-Capillary: 119 mg/dL — ABNORMAL HIGH (ref 70–99)

## 2023-12-04 LAB — URINE CULTURE: Culture: >=100000 — AB

## 2023-12-04 LAB — CBC
HCT: 27.3 % — ABNORMAL LOW (ref 36.0–46.0)
Hemoglobin: 8.3 g/dL — ABNORMAL LOW (ref 12.0–15.0)
MCH: 24.9 pg — ABNORMAL LOW (ref 26.0–34.0)
MCHC: 30.4 g/dL (ref 30.0–36.0)
MCV: 81.7 fL (ref 80.0–100.0)
Platelets: 252 10*3/uL (ref 150–400)
RBC: 3.34 MIL/uL — ABNORMAL LOW (ref 3.87–5.11)
RDW: 14.5 % (ref 11.5–15.5)
WBC: 8 10*3/uL (ref 4.0–10.5)
nRBC: 0 % (ref 0.0–0.2)

## 2023-12-04 LAB — BASIC METABOLIC PANEL WITH GFR
Anion gap: 13 (ref 5–15)
BUN: 15 mg/dL (ref 8–23)
CO2: 22 mmol/L (ref 22–32)
Calcium: 9.1 mg/dL (ref 8.9–10.3)
Chloride: 103 mmol/L (ref 98–111)
Creatinine, Ser: 1.14 mg/dL — ABNORMAL HIGH (ref 0.44–1.00)
GFR, Estimated: 54 mL/min — ABNORMAL LOW (ref >60)
Glucose, Bld: 243 mg/dL — ABNORMAL HIGH (ref 70–99)
Potassium: 4 mmol/L (ref 3.5–5.1)
Sodium: 138 mmol/L (ref 135–145)

## 2023-12-04 MED ORDER — LIDOCAINE 5 % EX PTCH
1.0000 | MEDICATED_PATCH | CUTANEOUS | Status: DC
  Administered 2023-12-04 – 2023-12-07 (×4): 1 via TRANSDERMAL
  Filled 2023-12-04 (×4): qty 1

## 2023-12-04 MED ORDER — INSULIN GLARGINE-YFGN 100 UNIT/ML ~~LOC~~ SOLN: 20.0000 [IU] | Freq: Every day | SUBCUTANEOUS | Status: DC

## 2023-12-04 MED ORDER — SENNOSIDES-DOCUSATE SODIUM 8.6-50 MG PO TABS
1.0000 | ORAL_TABLET | Freq: Two times a day (BID) | ORAL | Status: DC | PRN
Start: 2023-12-05 — End: 2023-12-07
  Administered 2023-12-06 – 2023-12-07 (×2): 1 via ORAL
  Filled 2023-12-04 (×2): qty 1

## 2023-12-04 MED ORDER — INSULIN ASPART 100 UNIT/ML IJ SOLN
5.0000 [IU] | Freq: Three times a day (TID) | INTRAMUSCULAR | Status: DC
  Administered 2023-12-04 – 2023-12-07 (×8): 5 [IU] via SUBCUTANEOUS

## 2023-12-04 MED ORDER — POLYETHYLENE GLYCOL 3350 17 G PO PACK
17.0000 g | PACK | Freq: Two times a day (BID) | ORAL | Status: AC
  Administered 2023-12-05: 17 g via ORAL
  Filled 2023-12-04 (×2): qty 1

## 2023-12-04 MED ORDER — ENOXAPARIN SODIUM 40 MG/0.4ML IJ SOSY
40.0000 mg | PREFILLED_SYRINGE | INTRAMUSCULAR | Status: DC
  Administered 2023-12-04 – 2023-12-06 (×3): 40 mg via SUBCUTANEOUS
  Filled 2023-12-04 (×3): qty 0.4

## 2023-12-04 MED ORDER — INSULIN GLARGINE-YFGN 100 UNIT/ML ~~LOC~~ SOLN
20.0000 [IU] | Freq: Every day | SUBCUTANEOUS | Status: AC
  Administered 2023-12-04: 20 [IU] via SUBCUTANEOUS
  Filled 2023-12-04: qty 0.2

## 2023-12-04 MED ORDER — HYDRALAZINE HCL 25 MG PO TABS: 25.0000 mg | ORAL_TABLET | Freq: Four times a day (QID) | ORAL | Status: DC | PRN

## 2023-12-04 MED ORDER — DICLOFENAC SODIUM 1 % EX GEL
2.0000 g | Freq: Four times a day (QID) | CUTANEOUS | Status: DC
  Administered 2023-12-04 – 2023-12-07 (×11): 2 g via TOPICAL
  Filled 2023-12-04 (×3): qty 100

## 2023-12-04 MED ORDER — SENNOSIDES-DOCUSATE SODIUM 8.6-50 MG PO TABS
2.0000 | ORAL_TABLET | Freq: Two times a day (BID) | ORAL | Status: AC
  Administered 2023-12-05: 2 via ORAL
  Filled 2023-12-04 (×2): qty 2

## 2023-12-04 MED ORDER — POLYETHYLENE GLYCOL 3350 17 G PO PACK
17.0000 g | PACK | Freq: Two times a day (BID) | ORAL | Status: DC | PRN
  Administered 2023-12-07: 17 g via ORAL
  Filled 2023-12-04 (×2): qty 1

## 2023-12-04 MED ORDER — INSULIN GLARGINE-YFGN 100 UNIT/ML ~~LOC~~ SOLN
30.0000 [IU] | Freq: Every day | SUBCUTANEOUS | Status: DC
  Administered 2023-12-05 – 2023-12-06 (×2): 30 [IU] via SUBCUTANEOUS
  Filled 2023-12-04 (×4): qty 0.3

## 2023-12-04 NOTE — Care Management Obs Status (Signed)
 MEDICARE OBSERVATION STATUS NOTIFICATION   Patient Details  Name: ZIANN SKUBIC MRN: [id number] Date of Birth: 04/07/1961   Medicare Observation Status Notification Given:   Yes.    Lynda Sands, RN 12/04/2023, 8:03 AM

## 2023-12-04 NOTE — Plan of Care (Signed)

## 2023-12-04 NOTE — Progress Notes (Signed)
   12/04/23 0838  TOC Assessment  Once discharged, how will the patient get to their discharge location? Family/Friend - Partnered Transport  Barriers to Discharge Continued Medical Work up  Patient states their goals for this hospitalization and ongoing recovery are: SNF  [place] ownership interest in [facility].provided to: Patient  Post Acute Care Choice Skilled Nursing Facility  Living arrangements for the past 2 months Single Family Home  Lives with: Spouse  Permission sought to share information with  Family Supports  Share Information with NAME Arieana Crego  Permission granted to share info w Relationship Spouse  Permission granted to share info w Contact Information [phone]  Patient language and need for interpreter reviewed: No  Need for Family Participation in Patient Care Y  Care giver support system in place? Y  Appearance: Appears stated age  Attitude/Demeanor/Rapport Engaged  Affect (typically observed) Stable  Orientation:  Oriented to Self;Oriented to Place;Oriented to  Time;Oriented to Situation   Observation Fall. Transition of Care Department [facility]) has reviewed patient. TOC will continue to monitor patient advancement through interdisciplinary progression rounds.

## 2023-12-04 NOTE — TOC Initial Note (Signed)
 Transition of Care [facility]) - Initial/Assessment Note    Patient Details  Name: Melanie Spencer MRN: [id number] Date of Birth: 1960-10-04  [facility]) CM/SW Contact:    Lynda Sands, RN Phone Number: 12/04/2023, 8:53 AM  Clinical Narrative:     CM met with patient and spouse at bedside. Patient needs assistance with adl's. Patient owns DME: a one hand walker and wheelchair.  Patient's spouse has been providing care. Patient reports she was admitted form home. Patient reports she just discharge from the [facility]. Patient and spouse would like to go back to the [facility] if her insurance will pay for care.                Barriers to Discharge: Continued Medical Work up   Patient Goals and CMS Choice Patient states their goals for this hospitalization and ongoing recovery are:: SNF      ownership interest in [facility].provided to:: Patient    Expected Discharge Plan and Services     Post Acute Care Choice: Skilled Nursing Facility Living arrangements for the past 2 months: Single Family Home                                      Prior Living Arrangements/Services Living arrangements for the past 2 months: Single Family Home Lives with:: Spouse Patient language and need for interpreter reviewed:: No        Need for Family Participation in Patient Care: Yes (Comment) Care giver support system in place?: Yes (comment)      Activities of Daily Living   ADL Screening (condition at time of admission) Independently performs ADLs?: No Does the patient have a NEW difficulty with bathing/dressing/toileting/self-feeding that is expected to last >3 days?: No Does the patient have a NEW difficulty with getting in/out of bed, walking, or climbing stairs that is expected to last >3 days?: No Does the patient have a NEW difficulty with communication that is expected to last >3 days?: No Is the patient deaf or have difficulty hearing?:  No Does the patient have difficulty seeing, even when wearing glasses/contacts?: No Does the patient have difficulty concentrating, remembering, or making decisions?: No  Permission Sought/Granted Permission sought to share information with : Family Supports    Share Information with NAME: Tajanae Sare     Permission granted to share info w Relationship: Spouse  Permission granted to share info w Contact Information: [phone]  Emotional Assessment Appearance:: Appears stated age Attitude/Demeanor/Rapport: Engaged Affect (typically observed): Stable Orientation: : Oriented to Self, Oriented to Place, Oriented to  Time, Oriented to Situation      Admission diagnosis:  Complicated UTI (urinary tract infection) [N39.0] Fall [W19.XXXA] Patient Active Problem List   Diagnosis Date Noted   Complicated UTI (urinary tract infection) 12/03/2023   Fall 12/03/2023   Hypertension associated with stage 3a chronic kidney disease due to type 2 diabetes mellitus (HCC) 11/14/2023   Hyperlipidemia associated with type 2 diabetes mellitus (HCC) 11/14/2023   Stage 3a chronic kidney disease due to type 2 diabetes mellitus (HCC) 11/14/2023   Hypomagnesemia 11/14/2023   Diabetic peripheral neuropathy associated with type 2 diabetes mellitus (HCC) 11/14/2023   History of CVA (cerebrovascular accident) 11/14/2023   Anemia, chronic renal failure, stage 3 (moderate) (HCC) 11/14/2023   Thrombocytopenia (HCC) 11/14/2023   Hemiparesis of left nondominant side (HCC) 11/14/2023   Aortic atherosclerosis (  HCC) 11/14/2023   Unspecified protein-calorie malnutrition (HCC) 11/10/2023   Generalized weakness 10/30/2023   UTI (urinary tract infection) 10/29/2023   Sepsis due to undetermined organism (HCC) 10/29/2023   Chronic kidney disease, stage 3a (HCC) 10/29/2023   Urge incontinence 07/26/2023   OAB (overactive bladder) 07/26/2023   Risk for falls 07/14/2023   Primary osteoarthritis of both hips 11/23/2022    Obesity (BMI 30-39.9) 10/15/2021   Normocytic anemia 10/15/2021   Acute metabolic encephalopathy 10/14/2021   GERD (gastroesophageal reflux disease) 10/14/2021   Hyperlipidemia 10/14/2021   Left leg pain 10/14/2021   Type 2 diabetes mellitus with hyperlipidemia (HCC) 11/09/2019   Osteomyelitis (HCC) 11/09/2019   Essential hypertension, benign 05/23/2019   Obesity, class 2 05/23/2019   Cerebrovascular disease 05/23/2019   Stroke [facility])    PCP:  [facility] Of Pharmacy:   [facility] [phone] - [place], [place] - [address] AT [facility] OF [address] [place] [address] Phone: [phone] Fax: [phone]  [facility] - [place], [place] - [address] [place] [address] Phone: [phone] Fax: [phone]     Social Drivers of Health (SDOH) Social History: SDOH Screenings   Food Insecurity: No Food Insecurity (10/29/2023)  Housing: Low Risk  (10/29/2023)  Transportation Needs: No Transportation Needs (10/29/2023)  Utilities: Not At Risk (10/29/2023)  Alcohol Screen: Low Risk  (07/13/2021)  Depression (PHQ2-9): Low Risk  (07/13/2021)  Financial Resource Strain: Low Risk  (11/23/2022)   [facility]  Physical Activity: Inactive (07/14/2023)   Received from [facility]  Social Connections: Moderately Integrated (07/14/2023)   Received from [facility]  Stress: No Stress Concern Present (07/14/2023)   Received from [facility]  Tobacco Use: Medium Risk (12/02/2023)  Health Literacy: Low Risk  (07/14/2023)   [facility] from [facility]   SDOH Interventions:     Readmission Risk Interventions    11/04/2023    2:57 PM 10/16/2021    3:21 PM  Readmission Risk Prevention Plan  Post Dischage Appt  Complete  Medication Screening Complete Complete  Transportation Screening Complete Complete

## 2023-12-04 NOTE — Care Management CC44 (Signed)
 Condition Code 44 Documentation Completed  Patient Details  Name: Melanie Spencer MRN: [id number] Date of Birth: [birth year]   Condition Code 44 given:    Patient signature on Condition Code 44 notice:   Yes Documentation of 2 MD's agreement:   Yes Code 44 added to claim: Yes      Lynda Sands, RN 12/04/2023, 3:17 PM

## 2023-12-04 NOTE — Progress Notes (Signed)
 PROGRESS NOTE  Melanie Spencer:[id number] DOB: 1961/07/19   PCP: Hoy Mackintosh, [facility]  Patient is from: Home  DOA: 12/02/2023 LOS: 1  Chief complaints Chief Complaint  Patient presents with   Fall     Brief Narrative / Interim history: 63 year old F with PMH of CVA with left hemiparesis, DM-2, HTN, HLD, "recurrent UTI" and recent hospitalization from 4/5-4/15 with septic shock due to UTI with encephalopathy for which she was treated with Zyvox  and meropenem  and discharged to SNF.  She was discharged home from SNF and presented to ED the next day due to accidental fall on her bottom when her legs gave up as she tried to reach to bedside commode the night of 5/9.  Did not hit her head or lose consciousness.  No prodromes.    In ED, stable vitals.  CBG 305.  Na 135. Cr 1.37 (1.2 on 4/28).  BUN 26.  WBC 12.8 with left shift.  Hgb 8.3 (baseline).  Lactic acid 1.1.  Portable CXR with mild left basilar atelectasis.  Bilateral knee x-ray without acute finding but degenerative changes.  UA with many bacteria and small LE.  EKG normal sinus rhythm.  Urine culture obtained.  Received ceftriaxone , and admitted for overnight observation.  Subjective: Seen and examined earlier this morning.  No major events overnight or this morning.  Patient's husband at bedside.  No specific complaints.  She denies new UTI symptoms such as dysuria, frequency, urgency or suprapubic pain.  Per husband, they were told to have fever to 100.6 in ED although this was not charted.  Objective: Vitals:   12/03/23 2000 12/03/23 2009 12/03/23 2128 12/04/23 0548  BP: (!) 154/65  (!) 168/66 (!) 147/54  Pulse:   77 74  Resp: 20  20 20   Temp:  98.7 F (37.1 C) 99 F (37.2 C) (!) 97.4 F (36.3 C)  TempSrc:  Oral Oral Oral  SpO2:   96% 97%  Weight:   105 kg     Examination:  GENERAL: No apparent distress.  Nontoxic. HEENT: MMM.  Vision and hearing grossly intact.  NECK: Supple.  No apparent JVD.  RESP:  No IWOB.   Fair aeration bilaterally. CVS:  RRR. Heart sounds normal.  ABD/GI/GU: BS+. Abd soft, NTND.  MSK/EXT:  Moves extremities. No apparent deformity. No edema.  SKIN: no apparent skin lesion or wound NEURO: Awake, alert and oriented appropriately.  LUE paresis.  Brace over left ankle. PSYCH: Calm. Normal affect.   Consultants:  None  Procedures: None  Microbiology summarized: Urine culture pending  Assessment and plan: Mechanical/accidental fall at home: Reportedly fell on her buttocks after her legs gave out.  No prodromes.  Did not strike her head or lose consciousness.  No traumatic injury.  She was sent home from SNF the day prior.  She has chronic ambulatory dysfunction probably from prior stroke. -Fall precaution -PT/OT eval   Bacteriuria with pyuria: Patient has no UTI symptoms.  Recently treated for septic shock due to UTI.  UA with many bacteria and small leukocytes.  Had mild leukocytosis on CBC that has resolved.  Received IV ceftriaxone  in ED -Observe off antibiotics  History of CVA with left hemiparesis and ambulatory dysfunction: No new focal neuro symptoms or deficit. -Continue home meds -PT/OT eval  IDDM-2 with hyperglycemia: A1c 8.5% on 4/5. Recent Labs  Lab 12/03/23 0805 12/03/23 1405 12/03/23 2235 12/04/23 0725 12/04/23 1121  GLUCAP 228* 303* 263* 228* 302*  -Semglee  20 units now.  Increase  to 30 units tomorrow -Continue SSI-moderate -Add NovoLog  5 units 3 times daily with meals -Further adjustment as appropriate - Hold home metformin   Essential hypertension: BP slightly elevated. - Continue home amlodipine , labetalol  and ramipril  -Hydralazine  as needed  Overactive bladder -Continue home Toviaz    Diabetic neuropathy,  -continue pregabalin    Obesity, class 2 Body mass index is 37.93 kg/m.          DVT prophylaxis:  SCDs Start: 12/03/23 2017 Patient is on full dose aspirin  that can serve VTE prophylaxis. Code Status: Full code Family  Communication: Updated patient's husband at bedside Level of care: Med-Surg Status is: Observation The patient will require care spanning > 2 midnights and should be moved to inpatient because: Safe disposition   Final disposition: To be determined   55 minutes with more than 50% spent in reviewing records, counseling patient/family and coordinating care.   Sch Meds:  Scheduled Meds:  amLODipine   10 mg Oral Daily   aspirin   325 mg Oral Daily   diclofenac  Sodium  2 g Topical QID   enoxaparin  (LOVENOX ) injection  0.5 mg/kg Subcutaneous Q24H   fesoterodine   8 mg Oral Daily   insulin  aspart  0-15 Units Subcutaneous TID WC   insulin  glargine-yfgn  20 Units Subcutaneous QHS   labetalol   200 mg Oral BID   lidocaine   1 patch Transdermal Q24H   pantoprazole   40 mg Oral Daily   pregabalin   50 mg Oral BID   ramipril   10 mg Oral BID   simvastatin   40 mg Oral QHS   Continuous Infusions: PRN Meds:.acetaminophen  **OR** acetaminophen , fluticasone , ondansetron  **OR** ondansetron  (ZOFRAN ) IV  Antimicrobials: Anti-infectives (From admission, onward)    Start     Dose/Rate Route Frequency Ordered Stop   12/03/23 2200  meropenem  (MERREM ) 1 g in sodium chloride  0.9 % 100 mL IVPB  Status:  Discontinued        1 g 200 mL/hr over 30 Minutes Intravenous Every 8 hours 12/03/23 2001 12/03/23 2218   12/03/23 1530  cephALEXin  (KEFLEX ) capsule 500 mg        500 mg Oral  Once 12/03/23 1520 12/03/23 1548   12/03/23 0400  cefTRIAXone  (ROCEPHIN ) 1 g in sodium chloride  0.9 % 100 mL IVPB        1 g 200 mL/hr over 30 Minutes Intravenous  Once 12/03/23 0358 12/03/23 0630        I have personally reviewed the following labs and images: CBC: Recent Labs  Lab 12/03/23 0019 12/04/23 0453  WBC 12.8* 8.0  NEUTROABS 10.9*  --   HGB 8.3* 8.3*  HCT 28.0* 27.3*  MCV 83.6 81.7  PLT 248 252   BMP &GFR Recent Labs  Lab 12/03/23 0019 12/04/23 0453  NA 134* 138  K 5.0 4.0  CL 103 103  CO2 23 22   GLUCOSE 309* 243*  BUN 26* 15  CREATININE 1.37* 1.14*  CALCIUM 8.5* 9.1   Estimated Creatinine Clearance: 61.3 mL/min (A) (by C-G formula based on SCr of 1.14 mg/dL (H)). Liver & Pancreas: No results for input(s): "AST", "ALT", "ALKPHOS", "BILITOT", "PROT", "ALBUMIN" in the last 168 hours. No results for input(s): "LIPASE", "AMYLASE" in the last 168 hours. No results for input(s): "AMMONIA" in the last 168 hours. Diabetic: No results for input(s): "HGBA1C" in the last 72 hours. Recent Labs  Lab 12/02/23 2343 12/03/23 0805 12/03/23 1405 12/03/23 2235 12/04/23 0725  GLUCAP 305* 228* 303* 263* 228*   Cardiac Enzymes: No results for input(s): "  CKTOTAL", "CKMB", "CKMBINDEX", "TROPONINI" in the last 168 hours. No results for input(s): "PROBNP" in the last 8760 hours. Coagulation Profile: No results for input(s): "INR", "PROTIME" in the last 168 hours. Thyroid  Function Tests: No results for input(s): "TSH", "T4TOTAL", "FREET4", "T3FREE", "THYROIDAB" in the last 72 hours. Lipid Profile: No results for input(s): "CHOL", "HDL", "LDLCALC", "TRIG", "CHOLHDL", "LDLDIRECT" in the last 72 hours. Anemia Panel: No results for input(s): "VITAMINB12", "FOLATE", "FERRITIN", "TIBC", "IRON", "RETICCTPCT" in the last 72 hours. Urine analysis:    Component Value Date/Time   COLORURINE YELLOW 12/03/2023 0329   APPEARANCEUR HAZY (A) 12/03/2023 0329   APPEARANCEUR Cloudy (A) 11/25/2022 1655   LABSPEC 1.013 12/03/2023 0329   PHURINE 5.0 12/03/2023 0329   GLUCOSEU >=500 (A) 12/03/2023 0329   HGBUR NEGATIVE 12/03/2023 0329   BILIRUBINUR NEGATIVE 12/03/2023 0329   BILIRUBINUR Negative 11/25/2022 1655   KETONESUR NEGATIVE 12/03/2023 0329   PROTEINUR 30 (A) 12/03/2023 0329   NITRITE NEGATIVE 12/03/2023 0329   LEUKOCYTESUR SMALL (A) 12/03/2023 0329   Sepsis Labs: Invalid input(s): "PROCALCITONIN", "LACTICIDVEN"  Microbiology: Recent Results (from the past 240 hours)  Urine Culture     Status:  None (Preliminary result)   Collection Time: 12/03/23  3:29 AM   Specimen: Urine, Random  Result Value Ref Range Status   Specimen Description   Final    URINE, RANDOM Performed at [facility], [address] [place], [place] [address]    Special Requests   Final    NONE Reflexed from [id number] Performed at [facility], [address], [place], [place] [address]    Culture   Final    CULTURE REINCUBATED FOR BETTER GROWTH Performed at [facility], [address] [place], [place] [address]    Report Status PENDING  Incomplete    Radiology Studies: No results found.    Chirag Krueger T. Medina Degraffenreid [facility]  If 7PM-7AM, please contact night-coverage [email or web address] 12/04/2023, 11:08 AM

## 2023-12-04 NOTE — Tx Team (Signed)
 Patient a&o. Denies any pain. No respiratory distress noted on room air. Redness noted to Bilateral Lower Legs. Redness to Bilateral Buttocks, skin intact, non-blanching.

## 2023-12-04 NOTE — Plan of Care (Signed)
   Problem: Education: Goal: Knowledge of General Education information will improve Description Including pain rating scale, medication(s)/side effects and non-pharmacologic comfort measures Outcome: Progressing

## 2023-12-05 DIAGNOSIS — W19XXXA Unspecified fall, initial encounter: Secondary | ICD-10-CM | POA: Diagnosis not present

## 2023-12-05 DIAGNOSIS — Z8673 Personal history of transient ischemic attack (TIA), and cerebral infarction without residual deficits: Secondary | ICD-10-CM | POA: Diagnosis not present

## 2023-12-05 DIAGNOSIS — I10 Essential (primary) hypertension: Secondary | ICD-10-CM | POA: Diagnosis not present

## 2023-12-05 DIAGNOSIS — E1169 Type 2 diabetes mellitus with other specified complication: Secondary | ICD-10-CM | POA: Diagnosis not present

## 2023-12-05 LAB — CBC
HCT: 28.1 % — ABNORMAL LOW (ref 36.0–46.0)
Hemoglobin: 8.4 g/dL — ABNORMAL LOW (ref 12.0–15.0)
MCH: 24.9 pg — ABNORMAL LOW (ref 26.0–34.0)
MCHC: 29.9 g/dL — ABNORMAL LOW (ref 30.0–36.0)
MCV: 83.4 fL (ref 80.0–100.0)
Platelets: 278 10*3/uL (ref 150–400)
RBC: 3.37 MIL/uL — ABNORMAL LOW (ref 3.87–5.11)
RDW: 14.5 % (ref 11.5–15.5)
WBC: 8.2 10*3/uL (ref 4.0–10.5)
nRBC: 0 % (ref 0.0–0.2)

## 2023-12-05 LAB — GLUCOSE, CAPILLARY
Glucose-Capillary: 188 mg/dL — ABNORMAL HIGH (ref 70–99)
Glucose-Capillary: 214 mg/dL — ABNORMAL HIGH (ref 70–99)
Glucose-Capillary: 104 mg/dL — ABNORMAL HIGH (ref 70–99)
Glucose-Capillary: 226 mg/dL — ABNORMAL HIGH (ref 70–99)

## 2023-12-05 LAB — RENAL FUNCTION PANEL
Albumin: 3.1 g/dL — ABNORMAL LOW (ref 3.5–5.0)
Anion gap: 7 (ref 5–15)
BUN: 18 mg/dL (ref 8–23)
CO2: 24 mmol/L (ref 22–32)
Calcium: 9 mg/dL (ref 8.9–10.3)
Chloride: 105 mmol/L (ref 98–111)
Creatinine, Ser: 1.27 mg/dL — ABNORMAL HIGH (ref 0.44–1.00)
GFR, Estimated: 48 mL/min — ABNORMAL LOW (ref >60)
Glucose, Bld: 181 mg/dL — ABNORMAL HIGH (ref 70–99)
Phosphorus: 4.3 mg/dL (ref 2.5–4.6)
Potassium: 4 mmol/L (ref 3.5–5.1)
Sodium: 136 mmol/L (ref 135–145)

## 2023-12-05 LAB — MAGNESIUM: Magnesium: 2 mg/dL (ref 1.7–2.4)

## 2023-12-05 MED ORDER — DICLOFENAC SODIUM 1 % EX GEL: 2.0000 g | Freq: Four times a day (QID) | CUTANEOUS | 2 refills | Status: AC

## 2023-12-05 NOTE — Evaluation (Signed)
 Physical Therapy Evaluation Patient Details Name: Melanie Spencer MRN: [id number] DOB: 08/30/1960 Today's Date: 12/05/2023  History of Present Illness  Melanie Spencer is a 63 y.o. female with medical history significant of prior CVA with left hemiparesis, hypertension, T2DM, hyperlipidemia, recurrent urinary tract infections and obesity class 3 who presented after a mechanical fall.   Recent hospitalization 10/29/23 to 11/08/23 due to severe urinary tract infection complicated with septic shock and metabolic encephalopathy.  She was treated with linezolid  and meropenem . She was discharged to SNF, by then she had completed her antibiotic therapy.   05/09 she discharged from SNF to home. Her husband noted her to be very weak and not yet able to ambulate independently.    The day prior she was noted to feel hot but not frank fevers or chills, denied any dysuria, increased urinary frequency or pelvic abdominal pain.   The night of 05/09 she was trying to reach her beside commode, when her legs gave up and she fell, landing on her buttocks, no head trauma or loss of consciousness.   Because severe bilateral knee pain post fall, her husband called EMS and she was transported to the ED.      At the time of my examination her knee pain has improved with analgesics, she continue very weak and deconditioned.   Clinical Impression  Patient demonstrates slow labored movement for getting into/out of bed, transfers and able to take a few steps at bedside leaning on armrest of chair, bed rail before having to sit due to weakness and had most difficulty completing sit to stands due to posterior leaning. Patient tolerated staying up in chair after therapy. Patient will benefit from continued skilled physical therapy in hospital and recommended venue below to increase strength, balance, endurance for safe ADLs and gait.           If plan is discharge home, recommend the following: A lot of help with  bathing/dressing/bathroom;A lot of help with walking and/or transfers;Help with stairs or ramp for entrance;Assistance with cooking/housework   Can travel by private vehicle   No    Equipment Recommendations None recommended by PT  Recommendations for Other Services       Functional Status Assessment Patient has had a recent decline in their functional status and demonstrates the ability to make significant improvements in function in a reasonable and predictable amount of time.     Precautions / Restrictions Precautions Precautions: Fall Recall of Precautions/Restrictions: Intact Restrictions Weight Bearing Restrictions Per Provider Order: No      Mobility  Bed Mobility Overal bed mobility: Needs Assistance Bed Mobility: Sidelying to Sit, Sit to Sidelying   Sidelying to sit: Mod assist, Used rails, Max assist     Sit to sidelying: Mod assist, Max assist General bed mobility comments: increased time, labored movement    Transfers Overall transfer level: Needs assistance Equipment used: 1 person hand held assist Transfers: Sit to/from Stand, Bed to chair/wheelchair/BSC Sit to Stand: Max assist   Step pivot transfers: Max assist       General transfer comment: had to lean on armrest of chair/bed rail with RUE for completing sit to stands    Ambulation/Gait   Gait Distance (Feet): 3 Feet Assistive device: 1 person hand held assist Gait Pattern/deviations: Decreased step length - left, Decreased stance time - right, Decreased stance time - left, Decreased stride length, Trunk flexed Gait velocity: slow     General Gait Details: limited to a few unsteady labored  steps at bedside mostly due to posterior leaning, poor standing balance, left sided weakness  Stairs            Wheelchair Mobility     Tilt Bed    Modified Rankin (Stroke Patients Only)       Balance Overall balance assessment: Needs assistance Sitting-balance support: Feet supported, No  upper extremity supported Sitting balance-Leahy Scale: Fair Sitting balance - Comments: seated at EOB   Standing balance support: During functional activity, Single extremity supported Standing balance-Leahy Scale: Poor Standing balance comment: using bed rail and grasping onto therapist                             Pertinent Vitals/Pain Pain Assessment Pain Assessment: No/denies pain    Home Living Family/patient expects to be discharged to:: Private residence Living Arrangements: Spouse/significant other Available Help at Discharge: Family;Available PRN/intermittently Type of Home: House Home Access: Ramped entrance       Home Layout: One level Home Equipment: Agricultural consultant (2 wheels);Cane - single point;BSC/3in1;Other (comment);Wheelchair - manual      Prior Function Prior Level of Function : Needs assist       Physical Assist : Mobility (physical);ADLs (physical) Mobility (physical): Bed mobility;Transfers;Gait ADLs (physical): Dressing;IADLs;Bathing Mobility Comments: Husband provides assist while pt ambulates short distance in the house. SPC used as well ADLs Comments: Assist for shower transfer; dressing; and IADL's.     Extremity/Trunk Assessment   Upper Extremity Assessment Upper Extremity Assessment: Defer to OT evaluation RUE Deficits / Details: General weakness LUE Deficits / Details: Significantly limited at baseline due to prior stroke. Pt reported shoulder clicking since fall at SNF. P/ROM no attempted due to this report. Significant tone noted in hand. LUE Coordination: decreased fine motor;decreased gross motor    Lower Extremity Assessment Lower Extremity Assessment: Generalized weakness;LLE deficits/detail LLE Deficits / Details: grossly 3+/5, except left ankle dorsiflexion -2/5 LLE Sensation: decreased proprioception LLE Coordination: decreased gross motor    Cervical / Trunk Assessment Cervical / Trunk Assessment: Kyphotic   Communication   Communication Communication: No apparent difficulties    Cognition Arousal: Alert Behavior During Therapy: WFL for tasks assessed/performed                             Following commands: Intact       Cueing Cueing Techniques: Verbal cues, Tactile cues     General Comments      Exercises     Assessment/Plan    PT Assessment Patient needs continued PT services  PT Problem List Decreased strength;Decreased activity tolerance;Decreased balance;Decreased mobility       PT Treatment Interventions DME instruction;Functional mobility training;Gait training;Therapeutic activities;Therapeutic exercise;Balance training;Patient/family education;Wheelchair mobility training    PT Goals (Current goals can be found in the Care Plan section)  Acute Rehab PT Goals Patient Stated Goal: return home with family to assist PT Goal Formulation: With patient Time For Goal Achievement: 12/19/23 Potential to Achieve Goals: Good    Frequency Min 3X/week     Co-evaluation PT/OT/SLP Co-Evaluation/Treatment: Yes Reason for Co-Treatment: To address functional/ADL transfers PT goals addressed during session: Mobility/safety with mobility;Balance;Proper use of DME OT goals addressed during session: ADL's and self-care       AM-PAC PT "6 Clicks" Mobility  Outcome Measure Help needed turning from your back to your side while in a flat bed without using bedrails?: A Lot Help needed moving from  lying on your back to sitting on the side of a flat bed without using bedrails?: A Lot Help needed moving to and from a bed to a chair (including a wheelchair)?: A Lot Help needed standing up from a chair using your arms (e.g., wheelchair or bedside chair)?: A Lot Help needed to walk in hospital room?: A Lot Help needed climbing 3-5 steps with a railing? : Total 6 Click Score: 11    End of Session   Activity Tolerance: Patient tolerated treatment well;Patient limited by  fatigue Patient left: in chair;with call bell/phone within reach Nurse Communication: Mobility status PT Visit Diagnosis: Unsteadiness on feet (R26.81);Other abnormalities of gait and mobility (R26.89);Muscle weakness (generalized) (M62.81)    Time: 2956-2130 PT Time Calculation (min) (ACUTE ONLY): 30 min   Charges:   PT Evaluation $PT Eval Moderate Complexity: 1 Mod PT Treatments $Therapeutic Activity: 23-37 mins PT General Charges $$ ACUTE PT VISIT: 1 Visit         12:02 PM, 12/05/23 Walton Guppy, MPT Physical Therapist with [facility] [phone] office [phone] mobile phone

## 2023-12-05 NOTE — [place] FL2 (Signed)
   MEDICAID FL2 LEVEL OF CARE FORM     IDENTIFICATION  Patient Name: Melanie Spencer Birthdate: Oct 22, 1960 Sex: female Admission Date (Current Location): 12/02/2023  [facility] and [place] Number:  [employer] and Address:  [facility],  [address] [name] [address]      Provider Number: [id number]  Attending Physician Name and Address:  Melanie Salinas, MD  Relative Name and Phone Number:  Melanie, Spencer (Spouse)  [phone]    Current Level of Care: Hospital Recommended Level of Care: Skilled Nursing Facility Prior Approval Number:    Date Approved/Denied:   PASRR Number: [id number]  Discharge Plan: SNF    Current Diagnoses: Patient Active Problem List   Diagnosis Date Noted   Complicated UTI (urinary tract infection) 12/03/2023   Fall 12/03/2023   Hypertension associated with stage 3a chronic kidney disease due to type 2 diabetes mellitus (HCC) 11/14/2023   Hyperlipidemia associated with type 2 diabetes mellitus (HCC) 11/14/2023   Stage 3a chronic kidney disease due to type 2 diabetes mellitus (HCC) 11/14/2023   Hypomagnesemia 11/14/2023   Diabetic peripheral neuropathy associated with type 2 diabetes mellitus (HCC) 11/14/2023   History of CVA (cerebrovascular accident) 11/14/2023   Anemia, chronic renal failure, stage 3 (moderate) (HCC) 11/14/2023   Thrombocytopenia (HCC) 11/14/2023   Hemiparesis of left nondominant side (HCC) 11/14/2023   Aortic atherosclerosis (HCC) 11/14/2023   Unspecified protein-calorie malnutrition (HCC) 11/10/2023   Generalized weakness 10/30/2023   UTI (urinary tract infection) 10/29/2023   Sepsis due to undetermined organism (HCC) 10/29/2023   Chronic kidney disease, stage 3a (HCC) 10/29/2023   Urge incontinence 07/26/2023   OAB (overactive bladder) 07/26/2023   Risk for falls 07/14/2023   Primary osteoarthritis of both hips 11/23/2022   Obesity (BMI 30-39.9) 10/15/2021   Normocytic anemia 10/15/2021    Acute metabolic encephalopathy 10/14/2021   GERD (gastroesophageal reflux disease) 10/14/2021   Hyperlipidemia 10/14/2021   Left leg pain 10/14/2021   Type 2 diabetes mellitus with hyperlipidemia (HCC) 11/09/2019   Osteomyelitis (HCC) 11/09/2019   Essential hypertension, benign 05/23/2019   Obesity, class 2 05/23/2019   Cerebrovascular disease 05/23/2019    Orientation RESPIRATION BLADDER Height & Weight     Self, Time, Situation, Place  Normal Continent Weight: 231 lb 7.7 oz (105 kg) Height:     BEHAVIORAL SYMPTOMS/MOOD NEUROLOGICAL BOWEL NUTRITION STATUS      Continent Diet (Regular)  AMBULATORY STATUS COMMUNICATION OF NEEDS Skin   Extensive Assist Verbally Normal                       Personal Care Assistance Level of Assistance  Bathing, Feeding, Dressing Bathing Assistance: Limited assistance Feeding assistance: Independent Dressing Assistance: Limited assistance     Functional Limitations Info  Sight, Hearing, Speech Sight Info: Adequate Hearing Info: Adequate Speech Info: Adequate    SPECIAL CARE FACTORS FREQUENCY  PT (By licensed PT), OT (By licensed OT)     PT Frequency: 5 times weekly OT Frequency: 5 times weekly            Contractures Contractures Info: Not present    Additional Factors Info  Code Status, Allergies Code Status Info: FULL Allergies Info: NKA           Current Medications (12/05/2023):  This is the current hospital active medication list Current Facility-Administered Medications  Medication Dose Route Frequency Provider Last Rate Last Admin   acetaminophen  (TYLENOL ) tablet 650 mg  650 mg Oral Q6H PRN  Arrien, Curlee Doss, MD   650 mg at 12/04/23 0325   Or   acetaminophen  (TYLENOL ) suppository 650 mg  650 mg Rectal Q6H PRN Arrien, Curlee Doss, MD       amLODipine  (NORVASC ) tablet 10 mg  10 mg Oral Daily Arrien, Curlee Doss, MD   10 mg at 12/05/23 0848   aspirin  tablet 325 mg  325 mg Oral Daily Arrien, Curlee Doss, MD   325 mg at 12/05/23 2355   diclofenac  Sodium (VOLTAREN ) 1 % topical gel 2 g  2 g Topical QID Gonfa, Taye T, MD   2 g at 12/05/23 7322   enoxaparin  (LOVENOX ) injection 40 mg  40 mg Subcutaneous Q24H Olin Bertin, RPH   40 mg at 12/04/23 2205   fesoterodine  (TOVIAZ ) tablet 8 mg  8 mg Oral Daily Gonfa, Taye T, MD   8 mg at 12/05/23 0851   fluticasone  (FLONASE ) 50 MCG/ACT nasal spray 2 spray  2 spray Each Nare Daily PRN Arrien, Mauricio Daniel, MD       hydrALAZINE  (APRESOLINE ) tablet 25 mg  25 mg Oral Q6H PRN Gonfa, Taye T, MD       insulin  aspart (novoLOG ) injection 0-15 Units  0-15 Units Subcutaneous TID WC Arrien, Mauricio Daniel, MD   5 Units at 12/05/23 1250   insulin  aspart (novoLOG ) injection 5 Units  5 Units Subcutaneous TID WC Gonfa, Taye T, MD   5 Units at 12/05/23 1251   insulin  glargine-yfgn (SEMGLEE ) injection 30 Units  30 Units Subcutaneous Daily Gonfa, Taye T, MD   30 Units at 12/05/23 0936   labetalol  (NORMODYNE ) tablet 200 mg  200 mg Oral BID Arrien, Mauricio Daniel, MD   200 mg at 12/05/23 0849   lidocaine  (LIDODERM ) 5 % 1 patch  1 patch Transdermal Q24H Arrien, Mauricio Daniel, MD   1 patch at 12/05/23 0436   ondansetron  (ZOFRAN ) tablet 4 mg  4 mg Oral Q6H PRN Arrien, Mauricio Daniel, MD       Or   ondansetron  (ZOFRAN ) injection 4 mg  4 mg Intravenous Q6H PRN Arrien, Mauricio Daniel, MD       pantoprazole  (PROTONIX ) EC tablet 40 mg  40 mg Oral Daily Arrien, Curlee Doss, MD   40 mg at 12/05/23 0850   polyethylene glycol (MIRALAX  / GLYCOLAX ) packet 17 g  17 g Oral BID Gonfa, Taye T, MD   17 g at 12/05/23 0850   Followed by   polyethylene glycol (MIRALAX  / GLYCOLAX ) packet 17 g  17 g Oral BID PRN Gonfa, Taye T, MD       pregabalin  (LYRICA ) capsule 50 mg  50 mg Oral BID Arrien, Mauricio Daniel, MD   50 mg at 12/05/23 0254   ramipril  (ALTACE ) capsule 10 mg  10 mg Oral BID Arrien, Mauricio Daniel, MD   10 mg at 12/05/23 0849   senna-docusate (Senokot-S) tablet 2  tablet  2 tablet Oral BID Gonfa, Taye T, MD   2 tablet at 12/05/23 0848   Followed by   senna-docusate (Senokot-S) tablet 1 tablet  1 tablet Oral BID PRN Gonfa, Taye T, MD       simvastatin  (ZOCOR ) tablet 40 mg  40 mg Oral QHS Arrien, Curlee Doss, MD   40 mg at 12/04/23 2205     Discharge Medications: Please see discharge summary for a list of discharge medications.  Relevant Imaging Results:  Relevant Lab Results:   Additional Information SSN: [id number], LCSWA

## 2023-12-05 NOTE — Evaluation (Signed)
 Occupational Therapy Evaluation Patient Details Name: Melanie Spencer MRN: [id number] DOB: 06/01/1961 Today's Date: 12/05/2023   History of Present Illness   MCKENA Spencer is a 63 y.o. female with medical history significant of prior CVA with left hemiparesis, hypertension, T2DM, hyperlipidemia, recurrent urinary tract infections and obesity class 3 who presented after a mechanical fall.   Recent hospitalization 10/29/23 to 11/08/23 due to severe urinary tract infection complicated with septic shock and metabolic encephalopathy.  She was treated with linezolid  and meropenem . She was discharged to SNF, by then she had completed her antibiotic therapy.   05/09 she discharged from SNF to home. Her husband noted her to be very weak and not yet able to ambulate independently.    The day prior she was noted to feel hot but not frank fevers or chills, denied any dysuria, increased urinary frequency or pelvic abdominal pain.   The night of 05/09 she was trying to reach her beside commode, when her legs gave up and she fell, landing on her buttocks, no head trauma or loss of consciousness.   Because severe bilateral knee pain post fall, her husband called EMS and she was transported to the ED.      At the time of my examination her knee pain has improved with analgesics, she continue very weak and deconditioned. (per MD)     Clinical Impressions Pt agreeable to OT and PT co-evaluation. Pt home for less than 24/7 after returning from SNF. Pt reports no ambulation at the SNF. Pt required mod to max A for bed mobility and max A for anterior to posterior transfer to and from chair today. Pt R UE generally weak. L UE limited at baseline from previous stroke. Much assist for lower body ADL's and some assist for upper body. Pt left in the chair with chair alarm set and call bell within reach. Pt will benefit from continued OT in the hospital and recommended venue below to increase strength, balance, and endurance for safe  ADL's.        If plan is discharge home, recommend the following:   A lot of help with walking and/or transfers;Two people to help with walking and/or transfers;A lot of help with bathing/dressing/bathroom;Assistance with cooking/housework;Assistance with feeding;Assist for transportation;Help with stairs or ramp for entrance     Functional Status Assessment   Patient has had a recent decline in their functional status and demonstrates the ability to make significant improvements in function in a reasonable and predictable amount of time.     Equipment Recommendations   None recommended by OT             Precautions/Restrictions   Precautions Precautions: Fall Recall of Precautions/Restrictions: Intact Restrictions Weight Bearing Restrictions Per Provider Order: No     Mobility Bed Mobility Overal bed mobility: Needs Assistance Bed Mobility: Sidelying to Sit, Sit to Sidelying   Sidelying to sit: Mod assist, Used rails, Max assist     Sit to sidelying: Mod assist, Max assist General bed mobility comments: labored movement; assist to move B LE into bed and to pull trunk to sit.    Transfers Overall transfer level: Needs assistance   Transfers: Sit to/from Stand, Bed to chair/wheelchair/BSC Sit to Stand: Max assist       Anterior-Posterior transfers: Max assist   General transfer comment: fearful of fall; using rails and chair arms to push to stand and guid to bed/chair      Balance Overall balance assessment: Needs assistance Sitting-balance  support: Single extremity supported, Feet supported Sitting balance-Leahy Scale: Fair Sitting balance - Comments: seated at EOB   Standing balance support: Single extremity supported, During functional activity Standing balance-Leahy Scale: Poor Standing balance comment: using bed rail and grasping onto therapist                           ADL either performed or assessed with clinical judgement    ADL Overall ADL's : Needs assistance/impaired Eating/Feeding: Set up;Sitting   Grooming: Sitting;Minimal assistance   Upper Body Bathing: Set up;Sitting   Lower Body Bathing: Total assistance;Sitting/lateral leans   Upper Body Dressing : Minimal assistance;Moderate assistance;Sitting   Lower Body Dressing: Total assistance;Sitting/lateral leans   Toilet Transfer: Maximal assistance;Anterior/posterior Toilet Transfer Details (indicate cue type and reason): Simulated chair to EOB and back. Toileting- Clothing Manipulation and Hygiene: Total assistance;Bed level               Vision Baseline Vision/History: 1 Wears glasses Ability to See in Adequate Light: 1 Impaired Patient Visual Report: No change from baseline Vision Assessment?: No apparent visual deficits     Perception Perception: Not tested       Praxis Praxis: Not tested       Pertinent Vitals/Pain Pain Assessment Pain Assessment: No/denies pain     Extremity/Trunk Assessment Upper Extremity Assessment Upper Extremity Assessment: RUE deficits/detail;LUE deficits/detail RUE Deficits / Details: General weakness LUE Deficits / Details: Significantly limited at baseline due to prior stroke. Pt reported shoulder clicking since fall at SNF. P/ROM no attempted due to this report. Significant tone noted in hand. LUE Coordination: decreased fine motor;decreased gross motor   Lower Extremity Assessment Lower Extremity Assessment: Defer to PT evaluation   Cervical / Trunk Assessment Cervical / Trunk Assessment: Kyphotic   Communication Communication Communication: No apparent difficulties   Cognition Arousal: Alert Behavior During Therapy: WFL for tasks assessed/performed Cognition: No apparent impairments                               Following commands: Intact       Cueing  General Comments   Cueing Techniques: Verbal cues;Tactile cues                 Home Living  Family/patient expects to be discharged to:: Private residence Living Arrangements: Spouse/significant other Available Help at Discharge: Family;Available PRN/intermittently Type of Home: House Home Access: Ramped entrance     Home Layout: One level     Bathroom Shower/Tub: Producer, television/film/video: Standard Bathroom Accessibility: No   Home Equipment: Agricultural consultant (2 wheels);Cane - single point;BSC/3in1;Other (comment);Wheelchair - manual (shower stool)          Prior Functioning/Environment Prior Level of Function : Needs assist       Physical Assist : Mobility (physical);ADLs (physical) Mobility (physical): Bed mobility;Transfers;Gait ADLs (physical): Dressing;IADLs;Bathing Mobility Comments: Husband provides assist while pt ambulates short distance in the house. SPC used as well. Pt reported no ambulation at SNF. ADLs Comments: Assist for shower transfer; dressing; and IADL's.    OT Problem List: Decreased strength;Decreased range of motion;Decreased activity tolerance;Impaired balance (sitting and/or standing);Obesity;Impaired UE functional use   OT Treatment/Interventions: Self-care/ADL training;Therapeutic exercise;DME and/or AE instruction;Therapeutic activities;Patient/family education;Balance training      OT Goals(Current goals can be found in the care plan section)   Acute Rehab OT Goals Patient Stated Goal: improve function OT Goal Formulation:  With patient Time For Goal Achievement: 12/19/23 Potential to Achieve Goals: Good   OT Frequency:  Min 2X/week    Co-evaluation PT/OT/SLP Co-Evaluation/Treatment: Yes Reason for Co-Treatment: To address functional/ADL transfers   OT goals addressed during session: ADL's and self-care      AM-PAC OT "6 Clicks" Daily Activity     Outcome Measure Help from another person eating meals?: A Little Help from another person taking care of personal grooming?: A Little Help from another person toileting,  which includes using toliet, bedpan, or urinal?: A Lot Help from another person bathing (including washing, rinsing, drying)?: A Lot Help from another person to put on and taking off regular upper body clothing?: A Little Help from another person to put on and taking off regular lower body clothing?: A Lot 6 Click Score: 15   End of Session Equipment Utilized During Treatment: Gait belt Nurse Communication:  (NT present during transfers)  Activity Tolerance: Patient tolerated treatment well Patient left: in chair;with call bell/phone within reach;with chair alarm set  OT Visit Diagnosis: Unsteadiness on feet (R26.81);Other abnormalities of gait and mobility (R26.89);Muscle weakness (generalized) (M62.81);History of falling (Z91.81)                Time: 1610-9604 OT Time Calculation (min): 23 min Charges:  OT General Charges $OT Visit: 1 Visit OT Evaluation $OT Eval Low Complexity: 1 Low  Aidenjames Heckmann OT, MOT   Thurnell Floss 12/05/2023, 10:55 AM

## 2023-12-05 NOTE — TOC Progression Note (Signed)
 Transition of Care [facility]) - Progression Note    Patient Details  Name: Melanie Spencer MRN: [id number] Date of Birth: [birth year]  Transition of Care [facility]) CM/SW Contact  Grandville Lax, [place] Phone Number: 12/05/2023, 2:11 PM  Clinical Narrative:    CSW updated by RN that pt and spouse now are agreeable to SNF placement and would like PNC if possible. CSW explained that pt will likely be in copay days, pts spouse is understanding. CSW to complete referral and send to [facility]. Insurance auth to be started also. TOC to follow.    Expected Discharge Plan: Home w Home Health Services Barriers to Discharge: Barriers Resolved  Expected Discharge Plan and Services In-house Referral: Clinical Social Work Discharge Planning Services: CM Consult Post Acute Care Choice: Home Health Living arrangements for the past 2 months: Single Family Home Expected Discharge Date: 12/05/23                         HH Arranged: PT, OT, Nurse's Aide HH Agency: [facility] Date [facility] Agency Contacted: 12/05/23   Representative spoke with at [facility] Agency: Randel Buss   Social Determinants of Health (SDOH) Interventions SDOH Screenings   Food Insecurity: No Food Insecurity (12/04/2023)  Housing: Low Risk  (12/04/2023)  Transportation Needs: No Transportation Needs (12/04/2023)  Utilities: Not At Risk (12/04/2023)  Alcohol Screen: Low Risk  (07/13/2021)  Depression (PHQ2-9): Low Risk  (07/13/2021)  Financial Resource Strain: Low Risk  (11/23/2022)   [facility] from [facility]  Physical Activity: Inactive (07/14/2023)   Received from [facility]  Social Connections: Moderately Integrated (07/14/2023)   Received from [facility]  Stress: No Stress Concern Present (07/14/2023)   [facility]  Tobacco Use: Medium Risk (12/02/2023)  Health Literacy: Low Risk  (07/14/2023)   Received from [facility]    Readmission Risk Interventions    11/04/2023    2:57 PM  10/16/2021    3:21 PM  Readmission Risk Prevention Plan  Post Dischage Appt  Complete  Medication Screening Complete Complete  Transportation Screening Complete Complete

## 2023-12-05 NOTE — Care Management Obs Status (Signed)
 MEDICARE OBSERVATION STATUS NOTIFICATION   Patient Details  Name: Melanie Spencer MRN: [id number] Date of Birth: Aug 25, 1960   Medicare Observation Status Notification Given:  Yes (letter given by Lynda Sands on 12/04/23)    Neila Bally 12/05/2023, 11:03 AM

## 2023-12-05 NOTE — Progress Notes (Signed)
 Mobility Specialist Progress Note:    12/05/23 1425  Mobility  Activity Transferred from chair to bed  Level of Assistance Dependent, patient does less than 25%  Assistive Device None  Range of Motion/Exercises Active Assistive;All extremities  Activity Response Tolerated well  Mobility Referral No  Mobility visit 1 Mobility  Mobility Specialist Start Time (ACUTE ONLY) 1405  Mobility Specialist Stop Time (ACUTE ONLY) 1425  Mobility Specialist Time Calculation (min) (ACUTE ONLY) 20 min   Nursing staff needing assistance to transfer. Pt dependent requiring multiple people to assist with transfer with no AD. Tolerated well, asx throughout. Left pt supine with nursing staff, all needs met.  Glinda Lapping Mobility Specialist Please contact via Special educational needs teacher or  Rehab office at [phone]

## 2023-12-05 NOTE — Plan of Care (Signed)
  Problem: Acute Rehab PT Goals(only PT should resolve) Goal: Pt Will Go Supine/Side To Sit Outcome: Progressing Flowsheets (Taken 12/05/2023 1204) Pt will go Supine/Side to Sit: with minimal assist Goal: Patient Will Transfer Sit To/From Stand Outcome: Progressing Flowsheets (Taken 12/05/2023 1204) Patient will transfer sit to/from stand: with moderate assist Goal: Pt Will Transfer Bed To Chair/Chair To Bed Outcome: Progressing Flowsheets (Taken 12/05/2023 1204) Pt will Transfer Bed to Chair/Chair to Bed: with mod assist Goal: Pt Will Ambulate Outcome: Progressing Flowsheets (Taken 12/05/2023 1204) Pt will Ambulate:  10 feet  with moderate assist  with cane Note: Hem-walker, quad-cane   12:05 PM, 12/05/23 Walton Guppy, MPT Physical Therapist with [facility] 336 [phone] office [phone] mobile phone

## 2023-12-05 NOTE — Plan of Care (Signed)
  Problem: Acute Rehab OT Goals (only OT should resolve) Goal: Pt. Will Perform Grooming Flowsheets (Taken 12/05/2023 1100) Pt Will Perform Grooming:  with set-up  sitting Goal: Pt. Will Perform Upper Body Bathing Flowsheets (Taken 12/05/2023 1100) Pt Will Perform Upper Body Bathing:  with set-up  sitting Goal: Pt. Will Perform Upper Body Dressing Flowsheets (Taken 12/05/2023 1100) Pt Will Perform Upper Body Dressing:  with set-up  sitting Goal: Pt. Will Perform Lower Body Dressing Flowsheets (Taken 12/05/2023 1100) Pt Will Perform Lower Body Dressing:  with mod assist  sitting/lateral leans  with adaptive equipment Goal: Pt. Will Transfer To Toilet Flowsheets (Taken 12/05/2023 1100) Pt Will Transfer to Toilet:  with min assist  with mod assist  anterior/posterior transfer Goal: Pt. Will Perform Toileting-Clothing Manipulation Flowsheets (Taken 12/05/2023 1100) Pt Will Perform Toileting - Clothing Manipulation and hygiene:  with mod assist  sitting/lateral leans Goal: Pt/Caregiver Will Perform Home Exercise Program Flowsheets (Taken 12/05/2023 1100) Pt/caregiver will Perform Home Exercise Program:  Increased strength  Increased ROM  Right Upper extremity  Left upper extremity  With minimal assist  Nadir Vasques OT, MOT

## 2023-12-05 NOTE — Discharge Summary (Signed)
 Physician Discharge Summary   Patient: Melanie Spencer MRN: [id number] DOB: 1961-03-07  Admit date:     12/02/2023  Discharge date: 12/05/23  Discharge Physician: Luna Salinas   PC[facility] Mackint[facility]   Recommendations at discharge:  Please obtain CBC and BMP on follow-up Follow-up with primary care provider Follow-up with neurology  Discharge Diagnoses: Principal Problem:   Fall Active Problems:   Essential hypertension, benign   Type 2 diabetes mellitus with hyperlipidemia (HCC)   History of CVA (cerebrovascular accident)   Obesity, class 2   Hospital Course: 63 year old F with PMH of CVA with left hemiparesis, DM-2, HTN, HLD, "recurrent UTI" and recent hospitalization from 4/5-4/15 with septic shock due to UTI with encephalopathy for which she was treated with Zyvox  and meropenem  and discharged to SNF.  She was discharged home from SNF and presented to ED the next day due to accidental fall on her bottom when her legs gave up as she tried to reach to bedside commode the night of 5/9.  Did not hit her head or lose consciousness.  No prodromes.    In ED, stable vitals.  CBG 305.  Na 135. Cr 1.37 (1.2 on 4/28).  BUN 26.  WBC 12.8 with left shift.  Hgb 8.3 (baseline).  Lactic acid 1.1.  Portable CXR with mild left basilar atelectasis.  Bilateral knee x-ray without acute finding but degenerative changes.  UA with many bacteria and small LE.  EKG normal sinus rhythm.  Urine culture growing lactobacillus.  Patient did receive ceftriaxone  in hospital and there is no need to continue antibiotics.  Leukocytosis resolved and patient did not had any urinary symptoms.  Due to her weakness and falls, she was evaluated by PT and OT and initially they recommend rehab.  As patient is recently discharged back home from rehab she does not want to go to a nursing facility at this time and requesting to go home with home health.  [facility] was [facility].  Patient with no other  significant new complaints and seems to be at baseline.  She does has class II obesity with BMI of 37.93 and need to encourage weight loss.  No new deficit noted.  She will continue with her home medications and need to have a close follow-up with her providers for further assistance.   Consultants: None Procedures performed: None Disposition: Home health Diet recommendation:  Discharge Diet Orders (From admission, onward)     Start     Ordered   12/05/23 0000  Diet - low sodium heart healthy        12/05/23 1207           Cardiac and Carb modified diet DISCHARGE MEDICATION: Allergies as of 12/05/2023   No Known Allergies      Medication List     TAKE these medications    amLODipine  10 MG tablet Commonly known as: NORVASC  Take 1 tablet (10 mg total) by mouth daily.   aspirin  325 MG tablet Take 325 mg by mouth daily.   cholecalciferol 1000 units tablet Commonly known as: VITAMIN D  Take 1,000 Units by mouth daily.   diclofenac  Sodium 1 % Gel Commonly known as: VOLTAREN  Apply 2 g topically 4 (four) times daily.   fesoterodine  8 MG Tb24 tablet Commonly known as: TOVIAZ  Take 1 tablet (8 mg total) by mouth daily.   fluticasone  50 MCG/ACT nasal spray Commonly known as: FLONASE  SHAKE LIQUID AND USE 2 SPRAYS IN EACH NOSTRIL EVERY DAY What  changed: See the new instructions.   Gerhardt's butt cream Crea Apply 1 Application topically daily. What changed: additional instructions   labetalol  200 MG tablet Commonly known as: NORMODYNE  Take 1 tablet (200 mg total) by mouth 2 (two) times daily.   Lantus  SoloStar 100 UNIT/ML Solostar Pen Generic drug: insulin  glargine ADMINISTER 40 UNITS UNDER THE SKIN AT BEDTIME What changed:  how much to take how to take this when to take this additional instructions   lidocaine  5 % Commonly known as: LIDODERM  Place 1 patch onto the skin daily. Remove & Discard patch within 12 hours or as directed by MD What changed:  additional instructions   magnesium  oxide 400 (240 Mg) MG tablet Commonly known as: MAG-OX Take 1 tablet (400 mg total) by mouth 2 (two) times daily.   metFORMIN  1000 MG tablet Commonly known as: GLUCOPHAGE  Take 1,000 mg by mouth 2 (two) times daily with a meal.   omeprazole 20 MG capsule Commonly known as: PRILOSEC Take 20 mg by mouth daily.   pregabalin  50 MG capsule Commonly known as: LYRICA  Take 1 capsule (50 mg total) by mouth 2 (two) times daily.   ramipril  10 MG capsule Commonly known as: ALTACE  Take 10 mg by mouth 2 (two) times daily.   simvastatin  40 MG tablet Commonly known as: ZOCOR  Take 1 tablet (40 mg total) by mouth at bedtime.   Trulicity  4.5 MG/0.5ML Soaj Generic drug: Dulaglutide  Inject 4.5 mg as directed once a week. Sundays What changed:  how to take this when to take this additional instructions        Follow-up Information     [facility], [facility]. Schedule an appointment as soon as possible for a visit in 1 week(s).   Specialty: Internal Medicine Contact information: [address] [place] [address] [phone]                Discharge Exam: Melanie Spencer Weights   12/03/23 2128  Weight: 105 kg   General.  Obese lady, in no acute distress. Pulmonary.  Lungs clear bilaterally, normal respiratory effort. CV.  Regular rate and rhythm, no JVD, rub or murmur. Abdomen.  Soft, nontender, nondistended, BS positive. CNS.  Alert and oriented .  Left residual hemiparesis, no new focal neurologic deficit. Extremities.  No edema, no cyanosis, pulses intact and symmetrical. Psychiatry.  Judgment and insight appears normal.   Condition at discharge: stable  The results of significant diagnostics from this hospitalization (including imaging, microbiology, ancillary and laboratory) are listed below for reference.   Imaging Studies: DG Knee Complete 4 Views Right Result Date: 12/03/2023 CLINICAL DATA:  Ground level fall. EXAM: RIGHT  KNEE - COMPLETE 4+ VIEW COMPARISON:  None Available. FINDINGS: No evidence of acute fracture or dislocation. A chronic appearing deformity is seen along the medial aspect of the distal right femur, at the level of the medial femoral condyle. Tricompartmental degenerative changes are present. A very small suprapatellar effusion is suspected. IMPRESSION: 1. No acute fracture or dislocation. 2. Chronic appearing deformity along the medial aspect of the distal right femur. Correlation with physical examination is recommended to determine the presence of point tenderness. 3. Tricompartmental degenerative changes. Electronically Signed   By: Virgle Grime M.D.   On: 12/03/2023 03:05   DG Knee Complete 4 Views Left Result Date: 12/03/2023 CLINICAL DATA:  Ground level fall. EXAM: LEFT KNEE - COMPLETE 4+ VIEW COMPARISON:  None Available. FINDINGS: No evidence of an acute fracture or dislocation. Tricompartmental degenerative changes are  noted. A very small suprapatellar effusion is suspected. IMPRESSION: 1. Tricompartmental degenerative changes. 2. Very small suprapatellar effusion. Electronically Signed   By: Virgle Grime M.D.   On: 12/03/2023 03:05   DG Chest Port 1 View Result Date: 12/03/2023 CLINICAL DATA:  Ground level fall. EXAM: PORTABLE CHEST 1 VIEW COMPARISON:  October 30, 2023 FINDINGS: The cardiac silhouette is mildly enlarged and unchanged in size. Mild atelectasis is seen within the left lung base. No pleural effusion or pneumothorax is identified. Chronic and degenerative changes are seen involving the left shoulder. IMPRESSION: Mild left basilar atelectasis. Electronically Signed   By: Virgle Grime M.D.   On: 12/03/2023 03:01    Microbiology: Results for orders placed or performed during the hospital encounter of 12/02/23  Urine Culture     Status: Abnormal   Collection Time: 12/03/23  3:29 AM   Specimen: Urine, Random  Result Value Ref Range Status   Specimen Description   Final     URINE, RANDOM Performed at [facility], [address] [place], [place] [address]    Special Requests   Final    NONE Reflexed from [id number] Performed at [facility], [address], [place], [place] [address]    Culture (A)  Final    >=100,000 COLONIES/mL LACTOBACILLUS SPECIES Standardized susceptibility testing for this organism is not available. Performed at [facility], [address], [place], [place] [address]    Report Status 12/04/2023 FINAL  Final    Labs: CBC: Recent Labs  Lab 12/03/23 0019 12/04/23 0453 12/05/23 0458  WBC 12.8* 8.0 8.2  NEUTROABS 10.9*  --   --   HGB 8.3* 8.3* 8.4*  HCT 28.0* 27.3* 28.1*  MCV 83.6 81.7 83.4  PLT 248 252 278   Basic Metabolic Panel: Recent Labs  Lab 12/03/23 0019 12/04/23 0453 12/05/23 0458  NA 134* 138 136  K 5.0 4.0 4.0  CL 103 103 105  CO2 23 22 24   GLUCOSE 309* 243* 181*  BUN 26* 15 18  CREATININE 1.37* 1.14* 1.27*  CALCIUM 8.5* 9.1 9.0  MG  --   --  2.0  PHOS  --   --  4.3   Liver Function Tests: Recent Labs  Lab 12/05/23 0458  ALBUMIN 3.1*   CBG: Recent Labs  Lab 12/04/23 0725 12/04/23 1121 12/04/23 1623 12/04/23 2147 12/05/23 0754  GLUCAP 228* 302* 183* 119* 188*    Discharge time spent: greater than 30 minutes.  This record has been created using Conservation officer, historic buildings. Errors have been sought and corrected,but may not always be located. Such creation errors do not reflect on the standard of care.   Signed: Luna Salinas, MD [facility] 12/05/2023

## 2023-12-05 NOTE — Plan of Care (Signed)

## 2023-12-05 NOTE — Plan of Care (Signed)
 Patient was discharged with home health as she wants initially.  Now patient and husband wants to go to rehab. TOC is trying to find a place.

## 2023-12-05 NOTE — TOC Initial Note (Signed)
 Transition of Care [facility]) - Initial/Assessment Note    Patient Details  Name: Melanie Spencer MRN: [id number] Date of Birth: [birth year]  Transition of Care [facility]) CM/SW Contact:    Grandville Lax, LCSWA Phone Number: 12/05/2023, 11:54 AM  Clinical Narrative:                 CSW spoke with pt at bedside to review PT recommendation for SNF. Pt states that she has been to [facility] in the past and does not wish to return. Pt states that she would like to return home with [facility]. CSW explained that [facility] is short term and they will not stay the day nor be there daily. Pt is understanding and states she will not go to SNF and will be returning home with [facility]. Pt does not have an agency preference. CSW spoke to [place] with [facility] who states they can accept referral for [facility] PT/OT/Aide services, CSW requested that MD place [facility] orders. TOC to follow.   Expected Discharge Plan: Home w Home Health Services Barriers to Discharge: Barriers Resolved   Patient Goals and CMS Choice Patient states their goals for this hospitalization and ongoing recovery are:: go home CMS Medicare.gov Compare Post Acute Care list provided to:: Patient Choice offered to / list presented to : Patient [place] ownership interest in [facility].provided to:: Patient    Expected Discharge Plan and Services In-house Referral: Clinical Social Work Discharge Planning Services: CM Consult Post Acute Care Choice: Home Health Living arrangements for the past 2 months: Single Family Home                           HH Arranged: PT, OT, Nurse's Aide HH Agency: [facility] Date [facility] Agency Contacted: 12/05/23   Representative spoke with at [facility] Agency: Randel Buss  Prior Living Arrangements/Services Living arrangements for the past 2 months: Single Family Home Lives with:: Spouse Patient language and need for interpreter reviewed:: Yes Do you feel safe going back to the place where you live?: Yes      Need for Family  Participation in Patient Care: Yes (Comment) Care giver support system in place?: Yes (comment) Current home services: DME Criminal Activity/Legal Involvement Pertinent to Current Situation/Hospitalization: No - Comment as needed  Activities of Daily Living   ADL Screening (condition at time of admission) Independently performs ADLs?: No Does the patient have a NEW difficulty with bathing/dressing/toileting/self-feeding that is expected to last >3 days?: No Does the patient have a NEW difficulty with getting in/out of bed, walking, or climbing stairs that is expected to last >3 days?: No Does the patient have a NEW difficulty with communication that is expected to last >3 days?: No Is the patient deaf or have difficulty hearing?: No Does the patient have difficulty seeing, even when wearing glasses/contacts?: No Does the patient have difficulty concentrating, remembering, or making decisions?: No  Permission Sought/Granted Permission sought to share information with : Family Supports    Share Information with NAME: Crystall Montour     Permission granted to share info w Relationship: Spouse  Permission granted to share info w Contact Information: [phone]  Emotional Assessment Appearance:: Appears stated age Attitude/Demeanor/Rapport: Engaged Affect (typically observed): Accepting Orientation: : Oriented to Self, Oriented to Place, Oriented to Situation, Oriented to  Time Alcohol / Substance Use: Not Applicable Psych Involvement: No (comment)  Admission diagnosis:  Complicated UTI (urinary tract infection) [N39.0] Fall [W19.XXXA] Patient Active Problem  List   Diagnosis Date Noted   Complicated UTI (urinary tract infection) 12/03/2023   Fall 12/03/2023   Hypertension associated with stage 3a chronic kidney disease due to type 2 diabetes mellitus (HCC) 11/14/2023   Hyperlipidemia associated with type 2 diabetes mellitus (HCC) 11/14/2023   Stage 3a chronic kidney disease due to  type 2 diabetes mellitus (HCC) 11/14/2023   Hypomagnesemia 11/14/2023   Diabetic peripheral neuropathy associated with type 2 diabetes mellitus (HCC) 11/14/2023   History of CVA (cerebrovascular accident) 11/14/2023   Anemia, chronic renal failure, stage 3 (moderate) (HCC) 11/14/2023   Thrombocytopenia (HCC) 11/14/2023   Hemiparesis of left nondominant side (HCC) 11/14/2023   Aortic atherosclerosis (HCC) 11/14/2023   Unspecified protein-calorie malnutrition (HCC) 11/10/2023   Generalized weakness 10/30/2023   UTI (urinary tract infection) 10/29/2023   Sepsis due to undetermined organism (HCC) 10/29/2023   Chronic kidney disease, stage 3a (HCC) 10/29/2023   Urge incontinence 07/26/2023   OAB (overactive bladder) 07/26/2023   Risk for falls 07/14/2023   Primary osteoarthritis of both hips 11/23/2022   Obesity (BMI 30-39.9) 10/15/2021   Normocytic anemia 10/15/2021   Acute metabolic encephalopathy 10/14/2021   GERD (gastroesophageal reflux disease) 10/14/2021   Hyperlipidemia 10/14/2021   Left leg pain 10/14/2021   Type 2 diabetes mellitus with hyperlipidemia (HCC) 11/09/2019   Osteomyelitis (HCC) 11/09/2019   Essential hypertension, benign 05/23/2019   Obesity, class 2 05/23/2019   Cerebrovascular disease 05/23/2019   Stroke [facility])    PCP:  [facility] Pharmacy:   [facility] [phone] - [place], [place] - [address] AT [facility] OF [address] [place] [address] Phone: [phone] Fax: [phone]  [facility] - [place], [place] - [address] [place] [address] Phone: [phone] Fax: [phone]     Social Drivers of Health (SDOH) Social History: SDOH Screenings   Food Insecurity: No Food Insecurity (12/04/2023)  Housing: Low Risk  (12/04/2023)  Transportation Needs: No Transportation Needs (12/04/2023)  Utilities: Not At Risk (12/04/2023)  Alcohol Screen: Low  Risk  (07/13/2021)  Depression (PHQ2-9): Low Risk  (07/13/2021)  Financial Resource Strain: Low Risk  (11/23/2022)   [facility] from [facility]  Physical Activity: Inactive (07/14/2023)   Received from [facility]  Social Connections: Moderately Integrated (07/14/2023)   Received from [facility]  Stress: No Stress Concern Present (07/14/2023)   [facility] from [facility]  Tobacco Use: Medium Risk (12/02/2023)  Health Literacy: Low Risk  (07/14/2023)   [facility] from [facility]   SDOH Interventions:     Readmission Risk Interventions    11/04/2023    2:57 PM 10/16/2021    3:21 PM  Readmission Risk Prevention Plan  Post Dischage Appt  Complete  Medication Screening Complete Complete  Transportation Screening Complete Complete

## 2023-12-06 DIAGNOSIS — I10 Essential (primary) hypertension: Secondary | ICD-10-CM | POA: Diagnosis not present

## 2023-12-06 DIAGNOSIS — W19XXXA Unspecified fall, initial encounter: Secondary | ICD-10-CM | POA: Diagnosis not present

## 2023-12-06 DIAGNOSIS — S8000XA Contusion of unspecified knee, initial encounter: Secondary | ICD-10-CM

## 2023-12-06 DIAGNOSIS — N3000 Acute cystitis without hematuria: Secondary | ICD-10-CM | POA: Diagnosis not present

## 2023-12-06 LAB — GLUCOSE, CAPILLARY
Glucose-Capillary: 191 mg/dL — ABNORMAL HIGH (ref 70–99)
Glucose-Capillary: 298 mg/dL — ABNORMAL HIGH (ref 70–99)
Glucose-Capillary: 226 mg/dL — ABNORMAL HIGH (ref 70–99)
Glucose-Capillary: 206 mg/dL — ABNORMAL HIGH (ref 70–99)

## 2023-12-06 NOTE — Progress Notes (Signed)
 PROGRESS NOTE  Melanie Spencer:[id number] DOB: Sep 08, 1960   PCP: Hoy Mackintosh[facility]  Patient is from: Home  DOA: 12/02/2023 LOS: 0  Chief complaints Chief Complaint  Patient presents with   Fall     Brief Narrative / Interim history: 63 year old F with PMH of CVA with left hemiparesis, DM-2, HTN, HLD, "recurrent UTI" and recent hospitalization from 4/5-4/15 with septic shock due to UTI with encephalopathy for which she was treated with Zyvox  and meropenem  and discharged to SNF.  She was discharged home from SNF and presented to ED the next day due to accidental fall on her bottom when her legs gave up as she tried to reach to bedside commode the night of 5/9.  Did not hit her head or lose consciousness.  No prodromes.    In ED, stable vitals.  CBG 305.  Na 135. Cr 1.37 (1.2 on 4/28).  BUN 26.  WBC 12.8 with left shift.  Hgb 8.3 (baseline).  Lactic acid 1.1.  Portable CXR with mild left basilar atelectasis.  Bilateral knee x-ray without acute finding but degenerative changes.  UA with many bacteria and small LE.  EKG normal sinus rhythm.  Urine culture obtained.  Received ceftriaxone , and admitted for overnight observation.  5/13: Hemodynamically stable, PT recommended SNF, patient was discharged home yesterday with home health at her request but later decided to go to rehab.  Pending insurance authorization  Subjective: Patient was sitting comfortably in chair when seen today.  No new concern.  Objective: Vitals:   12/05/23 0848 12/05/23 1512 12/05/23 2147 12/06/23 0344  BP: 109/79 (!) 133/50 (!) 144/52 (!) 151/55  Pulse: 65 64 70 65  Resp:  20 20 18   Temp:  99.1 F (37.3 C) 98 F (36.7 C) 98.2 F (36.8 C)  TempSrc:  Oral Oral Oral  SpO2:  100% 95% 98%  Weight:        Examination:  General.  Obese lady, in no acute distress. Pulmonary.  Lungs clear bilaterally, normal respiratory effort. CV.  Regular rate and rhythm, no JVD, rub or murmur. Abdomen.  Soft,  nontender, nondistended, BS positive. CNS.  Alert and oriented .  Left hemiparesis, more prominent in upper extremity, no new focal neurologic deficit. Extremities.  No edema, no cyanosis, pulses intact and symmetrical. Psychiatry.  Judgment and insight appears normal.    Consultants:  None  Procedures: None  Assessment and plan: Mechanical/accidental fall at home: Reportedly fell on her buttocks after her legs gave out.  No prodromes.  Did not strike her head or lose consciousness.  No traumatic injury.  She was sent home from SNF the day prior.  She has chronic ambulatory dysfunction probably from prior stroke. -Fall precaution -PT/OT eval-recommending SNF   Bacteriuria with pyuria: Patient has no UTI symptoms.  Recently treated for septic shock due to UTI.  UA with many bacteria and small leukocytes.  Had mild leukocytosis on CBC that has resolved.  Received IV ceftriaxone  in ED - Urine cultures with lactobacillus -Holding further antibiotics  History of CVA with left hemiparesis and ambulatory dysfunction: No new focal neuro symptoms or deficit. -Continue home meds -PT/OT eval-recommending SNF  IDDM-2 with hyperglycemia: A1c 8.5% on 4/5. -Semglee   30 units  -Continue SSI-moderate -Add NovoLog  5 units 3 times daily with meals -Further adjustment as appropriate - Hold home metformin   Essential hypertension: BP slightly elevated. - Continue home amlodipine , labetalol  and ramipril  -Hydralazine  as needed  Overactive bladder -Continue home Toviaz    Diabetic  neuropathy,  -continue pregabalin    Obesity, class 2 Body mass index is 37.93 kg/m.  DVT prophylaxis:  enoxaparin  (LOVENOX ) injection 40 mg Start: 12/04/23 2200 SCDs Start: 12/03/23 2017 Patient is on full dose aspirin  that can serve VTE prophylaxis. Code Status: Full code Family Communication:  Level of care: Med-Surg Status is: Observation The patient will require care spanning > 2 midnights and should be moved  to inpatient because: Safe disposition   Final disposition: SNF  45 minutes with more than 50% spent in reviewing records, counseling patient/family and coordinating care.   Sch Meds:  Scheduled Meds:  amLODipine   10 mg Oral Daily   aspirin   325 mg Oral Daily   diclofenac  Sodium  2 g Topical QID   enoxaparin  (LOVENOX ) injection  40 mg Subcutaneous Q24H   fesoterodine   8 mg Oral Daily   insulin  aspart  0-15 Units Subcutaneous TID WC   insulin  aspart  5 Units Subcutaneous TID WC   insulin  glargine-yfgn  30 Units Subcutaneous Daily   labetalol   200 mg Oral BID   lidocaine   1 patch Transdermal Q24H   pantoprazole   40 mg Oral Daily   pregabalin   50 mg Oral BID   ramipril   10 mg Oral BID   simvastatin   40 mg Oral QHS   Continuous Infusions: PRN Meds:.acetaminophen  **OR** acetaminophen , fluticasone , hydrALAZINE , ondansetron  **OR** ondansetron  (ZOFRAN ) IV, [EXPIRED] polyethylene glycol **FOLLOWED BY** polyethylene glycol, [EXPIRED] senna-docusate **FOLLOWED BY** senna-docusate  Antimicrobials: Anti-infectives (From admission, onward)    Start     Dose/Rate Route Frequency Ordered Stop   12/03/23 2200  meropenem  (MERREM ) 1 g in sodium chloride  0.9 % 100 mL IVPB  Status:  Discontinued        1 g 200 mL/hr over 30 Minutes Intravenous Every 8 hours 12/03/23 2001 12/03/23 2218   12/03/23 1530  cephALEXin  (KEFLEX ) capsule 500 mg        500 mg Oral  Once 12/03/23 1520 12/03/23 1548   12/03/23 0400  cefTRIAXone  (ROCEPHIN ) 1 g in sodium chloride  0.9 % 100 mL IVPB        1 g 200 mL/hr over 30 Minutes Intravenous  Once 12/03/23 0358 12/03/23 0630        I have personally reviewed the following labs and images: CBC: Recent Labs  Lab 12/03/23 0019 12/04/23 0453 12/05/23 0458  WBC 12.8* 8.0 8.2  NEUTROABS 10.9*  --   --   HGB 8.3* 8.3* 8.4*  HCT 28.0* 27.3* 28.1*  MCV 83.6 81.7 83.4  PLT 248 252 278   BMP &GFR Recent Labs  Lab 12/03/23 0019 12/04/23 0453 12/05/23 0458  NA  134* 138 136  K 5.0 4.0 4.0  CL 103 103 105  CO2 23 22 24   GLUCOSE 309* 243* 181*  BUN 26* 15 18  CREATININE 1.37* 1.14* 1.27*  CALCIUM 8.5* 9.1 9.0  MG  --   --  2.0  PHOS  --   --  4.3   Estimated Creatinine Clearance: 55 mL/min (A) (by C-G formula based on SCr of 1.27 mg/dL (H)). Liver & Pancreas: Recent Labs  Lab 12/05/23 0458  ALBUMIN 3.1*   No results for input(s): "LIPASE", "AMYLASE" in the last 168 hours. No results for input(s): "AMMONIA" in the last 168 hours. Diabetic: No results for input(s): "HGBA1C" in the last 72 hours. Recent Labs  Lab 12/05/23 1146 12/05/23 1653 12/05/23 2136 12/06/23 0808 12/06/23 1300  GLUCAP 214* 104* 226* 191* 298*   Cardiac Enzymes: No results  for input(s): "CKTOTAL", "CKMB", "CKMBINDEX", "TROPONINI" in the last 168 hours. No results for input(s): "PROBNP" in the last 8760 hours. Coagulation Profile: No results for input(s): "INR", "PROTIME" in the last 168 hours. Thyroid  Function Tests: No results for input(s): "TSH", "T4TOTAL", "FREET4", "T3FREE", "THYROIDAB" in the last 72 hours. Lipid Profile: No results for input(s): "CHOL", "HDL", "LDLCALC", "TRIG", "CHOLHDL", "LDLDIRECT" in the last 72 hours. Anemia Panel: No results for input(s): "VITAMINB12", "FOLATE", "FERRITIN", "TIBC", "IRON", "RETICCTPCT" in the last 72 hours. Urine analysis:    Component Value Date/Time   COLORURINE YELLOW 12/03/2023 0329   APPEARANCEUR HAZY (A) 12/03/2023 0329   APPEARANCEUR Cloudy (A) 11/25/2022 1655   LABSPEC 1.013 12/03/2023 0329   PHURINE 5.0 12/03/2023 0329   GLUCOSEU >=500 (A) 12/03/2023 0329   HGBUR NEGATIVE 12/03/2023 0329   BILIRUBINUR NEGATIVE 12/03/2023 0329   BILIRUBINUR Negative 11/25/2022 1655   KETONESUR NEGATIVE 12/03/2023 0329   PROTEINUR 30 (A) 12/03/2023 0329   NITRITE NEGATIVE 12/03/2023 0329   LEUKOCYTESUR SMALL (A) 12/03/2023 0329   Sepsis Labs: Invalid input(s): "PROCALCITONIN",  "LACTICIDVEN"  Microbiology: Recent Results (from the past 240 hours)  Urine Culture     Status: Abnormal   Collection Time: 12/03/23  3:29 AM   Specimen: Urine, Random  Result Value Ref Range Status   Specimen Description   Final    URINE, RANDOM Performed at [facility], [address] [place], [place] [address]    Special Requests   Final    NONE Reflexed from [id number] Performed at [facility], [address], [place], [place] [address]    Culture (A)  Final    >=100,000 COLONIES/mL LACTOBACILLUS SPECIES Standardized susceptibility testing for this organism is not available. Performed at [facility], [address], [place], [place] [address]    Report Status 12/04/2023 FINAL  Final    Radiology Studies: No results found.  This record has been created using Conservation officer, historic buildings. Errors have been sought and corrected,but may not always be located. Such creation errors do not reflect on the standard of care.   Luna Salinas, MD [facility]  If 7PM-7AM, please contact night-coverage [email or web address] 12/06/2023, 2:37 PM

## 2023-12-06 NOTE — Progress Notes (Incomplete)
 Husband left this morning for work and asked for dayshift nurse or case management to call him as soon as the transfer process for rehab facility starts in the morning. He will have his cell phone [phone].

## 2023-12-06 NOTE — Plan of Care (Signed)

## 2023-12-06 NOTE — Progress Notes (Signed)
 Physical Therapy Treatment Patient Details Name: Melanie Spencer MRN: [id number] DOB: [birth year] Today's Date: 12/06/2023   History of Present Illness Melanie Spencer is a 63 y.o. female with medical history significant of prior CVA with left hemiparesis, hypertension, T2DM, hyperlipidemia, recurrent urinary tract infections and obesity class 3 who presented after a mechanical fall.   Recent hospitalization 10/29/23 to 11/08/23 due to severe urinary tract infection complicated with septic shock and metabolic encephalopathy.  She was treated with linezolid  and meropenem . She was discharged to SNF, by then she had completed her antibiotic therapy.   05/09 she discharged from SNF to home. Her husband noted her to be very weak and not yet able to ambulate independently.    The day prior she was noted to feel hot but not frank fevers or chills, denied any dysuria, increased urinary frequency or pelvic abdominal pain.   The night of 05/09 she was trying to reach her beside commode, when her legs gave up and she fell, landing on her buttocks, no head trauma or loss of consciousness.   Because severe bilateral knee pain post fall, her husband called EMS and she was transported to the ED.      At the time of my examination her knee pain has improved with analgesics, she continue very weak and deconditioned.    PT Comments  Patient tolerated treatment session well. Patient was received seated in recliner. Today's session included LE exercises in seated position for general muscular strengthening. See details below under Exercise section. 3 STS at end of session with Max A. See details below under transfer section. Patient requested to be left in recliner as husband was on his way to visit. Chair alarm set. Patient will benefit from continued skilled physical therapy acutely and in recommended venue in order to address remaining deficits to improve function and QOL.    If plan is discharge home, recommend the following: A  lot of help with bathing/dressing/bathroom;A lot of help with walking and/or transfers;Help with stairs or ramp for entrance;Assistance with cooking/housework   Can travel by private vehicle     No  Equipment Recommendations  None recommended by PT    Recommendations for Other Services       Precautions / Restrictions Precautions Precautions: Fall Recall of Precautions/Restrictions: Intact Restrictions Weight Bearing Restrictions Per Provider Order: No     Mobility  Bed Mobility       General bed mobility comments: Not assessed this treatment session. Pt received sitting in recliner. Exercises performed in seated/standing position.    Transfers Overall transfer level: Needs assistance Equipment used: 1 person hand held assist Transfers: Sit to/from Stand Sit to Stand: Max assist       General transfer comment: Max A for 3 STS during exercise program. Unable to utilize LUE, push off with RUE from arm rest. During last STS, L ankle begins to roll laterally. Ends trial there d/t fatigue.    Ambulation/Gait       General Gait Details: Not assessed this treatment session. Pt received sitting in recliner. Exercises performed in seated/standing position.   Stairs         Wheelchair Mobility     Tilt Bed    Modified Rankin (Stroke Patients Only)       Balance Overall balance assessment: Needs assistance Sitting-balance support: No upper extremity supported, Feet supported, Bilateral upper extremity supported Sitting balance-Leahy Scale: Fair Sitting balance - Comments: Fair seated balance observed sitting in recliner with varying UE  support from RUE or no UE   Standing balance support: During functional activity, Single extremity supported Standing balance-Leahy Scale: Poor Standing balance comment: During STS, RUE remains in contact with arm rest.        Communication Communication Communication: No apparent difficulties  Cognition Arousal:  Alert Behavior During Therapy: WFL for tasks assessed/performed         Following commands: Intact      Cueing Cueing Techniques: Verbal cues, Tactile cues  Exercises General Exercises - Lower Extremity Long Arc Quad: AROM, Strengthening, Both, 10 reps, Seated Hip Flexion/Marching: AROM, Strengthening, Both, 10 reps, Seated Toe Raises: AROM, Strengthening, Both, 10 reps, Seated Heel Raises: AROM, Strengthening, Both, 10 reps, Seated Other Exercises Other Exercises: 3 Sit to Stands from recliner, Max A, blocking pt L foot and knee at times, unable to complete full stands 2/2 weakness, imbalance, fear    General Comments        Pertinent Vitals/Pain Pain Assessment Pain Assessment: No/denies pain    Home Living                          Prior Function            PT Goals (current goals can now be found in the care plan section) Acute Rehab PT Goals Patient Stated Goal: return home with family to assist PT Goal Formulation: With patient Time For Goal Achievement: 12/19/23 Potential to Achieve Goals: Good Progress towards PT goals: Progressing toward goals    Frequency    Min 3X/week      PT Plan      Co-evaluation              AM-PAC PT "6 Clicks" Mobility   Outcome Measure  Help needed turning from your back to your side while in a flat bed without using bedrails?: A Lot Help needed moving from lying on your back to sitting on the side of a flat bed without using bedrails?: A Lot Help needed moving to and from a bed to a chair (including a wheelchair)?: A Lot Help needed standing up from a chair using your arms (e.g., wheelchair or bedside chair)?: A Lot Help needed to walk in hospital room?: A Lot Help needed climbing 3-5 steps with a railing? : Total 6 Click Score: 11    End of Session Equipment Utilized During Treatment: Gait belt Activity Tolerance: Patient tolerated treatment well;Patient limited by fatigue Patient left: in  chair Nurse Communication: Mobility status PT Visit Diagnosis: Unsteadiness on feet (R26.81);Other abnormalities of gait and mobility (R26.89);Muscle weakness (generalized) (M62.81)     Time: 1543-1600 PT Time Calculation (min) (ACUTE ONLY): 17 min  Charges:    $Therapeutic Exercise: 8-22 mins PT General Charges $$ ACUTE PT VISIT: 1 Visit                     4:33 PM, 12/06/23 Kaniel Kiang Powell-Butler, PT, DPT [place] with [facility]

## 2023-12-06 NOTE — TOC Progression Note (Signed)
 Transition of Care [facility]) - Progression Note    Patient Details  Name: Melanie Spencer MRN: [id number] Date of Birth: [birth year]  Transition of Care [facility]) CM/SW Contact  Grandville Lax, [place] Phone Number: 12/06/2023, 10:47 AM  Clinical Narrative:    CSW spoke with pts spouse to review bed offers, he accepts bed at [facility] at this time, requesting a private room. CSW spoke to Destiny at [facility] who states they have a private room and can offer a bed to pt. CSW updated HUB of bed choice. CSW added facility of choice to pts insurance auth. [employer] is still pending at this time. TOC to follow.   Expected Discharge Plan: Home w Home Health Services Barriers to Discharge: Barriers Resolved  Expected Discharge Plan and Services In-house Referral: Clinical Social Work Discharge Planning Services: CM Consult Post Acute Care Choice: Home Health Living arrangements for the past 2 months: Single Family Home Expected Discharge Date: 12/05/23                         HH Arranged: PT, OT, Nurse's Aide HH Agency: [facility] Date [facility] Agency Contacted: 12/05/23   Representative spoke with at [facility] Agency: Randel Buss   Social Determinants of Health (SDOH) Interventions SDOH Screenings   Food Insecurity: No Food Insecurity (12/04/2023)  Housing: Low Risk  (12/04/2023)  Transportation Needs: No Transportation Needs (12/04/2023)  Utilities: Not At Risk (12/04/2023)  Alcohol Screen: Low Risk  (07/13/2021)  Depression (PHQ2-9): Low Risk  (07/13/2021)  Financial Resource Strain: Low Risk  (11/23/2022)   Received from [facility]  Physical Activity: Inactive (07/14/2023)   Received from [facility]  Social Connections: Moderately Integrated (07/14/2023)   Received from [facility]  Stress: No Stress Concern Present (07/14/2023)   Received from [facility]  Tobacco Use: Medium Risk (12/02/2023)  Health Literacy: Low Risk  (07/14/2023)   Received from [facility]     Readmission Risk Interventions    11/04/2023    2:57 PM 10/16/2021    3:21 PM  Readmission Risk Prevention Plan  Post Dischage Appt  Complete  Medication Screening Complete Complete  Transportation Screening Complete Complete

## 2023-12-06 NOTE — Progress Notes (Signed)
 Mobility Specialist Progress Note:    12/06/23 1010  Mobility  Activity Transferred from bed to chair  Level of Assistance Maximum assist, patient does 25-49%  Assistive Device None  Range of Motion/Exercises Active Assistive;All extremities  Activity Response Tolerated well  Mobility Referral No  Mobility visit 1 Mobility  Mobility Specialist Start Time (ACUTE ONLY) 0950  Mobility Specialist Stop Time (ACUTE ONLY) 1010  Mobility Specialist Time Calculation (min) (ACUTE ONLY) 20 min   Pt received requesting assistance to chair. PT Derwood Flor assisted mobility for transfer. Required MaxA to stand and transfer with no AD. Tolerated well, asx throughout. Left pt in chair, call bell in hand. All needs met.   Glinda Lapping Mobility Specialist Please contact via Special educational needs teacher or  Rehab office at [phone]

## 2023-12-07 DIAGNOSIS — W19XXXA Unspecified fall, initial encounter: Secondary | ICD-10-CM | POA: Diagnosis not present

## 2023-12-07 DIAGNOSIS — I10 Essential (primary) hypertension: Secondary | ICD-10-CM | POA: Diagnosis not present

## 2023-12-07 LAB — GLUCOSE, CAPILLARY: Glucose-Capillary: 171 mg/dL — ABNORMAL HIGH (ref 70–99)

## 2023-12-07 NOTE — TOC Transition Note (Signed)
 Transition of Care [facility]) - Discharge Note   Patient Details  Name: Melanie Spencer MRN: [id number] Date of Birth: 06-Feb-1961  Transition of Care [facility]) CM/SW Contact:  Grandville Lax, LCSWA Phone Number: 12/07/2023, 10:23 AM  Clinical Narrative:    CSW updated that MD completed peer to peer and SNF insurance [employer] has been approved at this time. CSW updated Destiny in admissions who states they can accept pt at this time. MD completed D/C at this time. D/C clinicals sent via HUB to facility. CSW provided RN with room and report numbers. CSW updated pts spouse at this time. Med necessity completed and printed to floor for RN. EMS called for transport. TOC signing off.   Final next level of care: Skilled Nursing Facility Barriers to Discharge: Barriers Resolved   Patient Goals and CMS Choice Patient states their goals for this hospitalization and ongoing recovery are:: return home CMS Medicare.gov Compare Post Acute Care list provided to:: Patient Choice offered to / list presented to : Patient [place] ownership interest in [facility].provided to:: Patient    Discharge Placement              Patient chooses bed at: [facility] Patient to be transferred to facility by: EMS Name of family member notified: Spouse Patient and family notified of of transfer: 12/07/23  Discharge Plan and Services Additional resources added to the After Visit Summary for   In-house Referral: Clinical Social Work Discharge Planning Services: CM Consult Post Acute Care Choice: Home Health                    HH Arranged: PT, OT, Nurse's Aide HH Agency: [facility] Date [facility] Agency Contacted: 12/05/23   Representative spoke with at [facility] Agency: Randel Buss  Social Drivers of Health (SDOH) Interventions SDOH Screenings   Food Insecurity: No Food Insecurity (12/04/2023)  Housing: Low Risk  (12/04/2023)  Transportation Needs: No Transportation Needs (12/04/2023)  Utilities:  Not At Risk (12/04/2023)  Alcohol Screen: Low Risk  (07/13/2021)  Depression (PHQ2-9): Low Risk  (07/13/2021)  Financial Resource Strain: Low Risk  (11/23/2022)   Received from [facility]  Physical Activity: Inactive (07/14/2023)   Received from [facility]  Social Connections: Moderately Integrated (07/14/2023)   Received from [facility]  Stress: No Stress Concern Present (07/14/2023)   Received from [facility]  Tobacco Use: Medium Risk (12/02/2023)  Health Literacy: Low Risk  (07/14/2023)   Received from [facility]     Readmission Risk Interventions    11/04/2023    2:57 PM 10/16/2021    3:21 PM  Readmission Risk Prevention Plan  Post Dischage Appt  Complete  Medication Screening Complete Complete  Transportation Screening Complete Complete

## 2023-12-07 NOTE — Discharge Summary (Signed)
 Physician Discharge Summary  Melanie Spencer:[id number] DOB: 1961-01-05 DOA: 12/02/2023  PCP: [facility] Mackintosh, [facility]  Admit date: 12/02/2023  Discharge date: 12/07/2023  Admitted From:Home  Disposition:  SNF  Recommendations for Outpatient Follow-up:  Follow up with PCP in 1-2 weeks Continue on medications as noted below  Home Health: None  Equipment/Devices: None  Discharge Condition:Stable  CODE STATUS: Full  Diet recommendation: Heart Healthy/carb modified  Brief/Interim Summary: 63 year old F with PMH of CVA with left hemiparesis, DM-2, HTN, HLD, "recurrent UTI" and recent hospitalization from 4/5-4/15 with septic shock due to UTI with encephalopathy for which she was treated with Zyvox  and meropenem  and discharged to SNF. She was discharged home from SNF and presented to ED the next day due to accidental fall on her bottom when her legs gave up as she tried to reach to bedside commode the night of 5/9. Did not hit her head or lose consciousness. No prodromes.   Patient was admitted with mechanical fall at home with no significant injury noted.  She was also noted to have some bacteriuria with pyuria, but this was monitored and not treated due to no significant UTI symptoms noted.  She was seen by PT/OT recommending SNF and has been approved for this at this time and is now eligible for discharge.  She was awaiting insurance authorization and approval for several days and required peer to peer review on the day of discharge.  No other acute events or concerns noted throughout this admission.  Discharge Diagnoses:  Principal Problem:   Fall Active Problems:   Essential hypertension, benign   Type 2 diabetes mellitus with hyperlipidemia (HCC)   History of CVA (cerebrovascular accident)   Obesity, class 2   Contusion of knee  Principal discharge diagnosis: Mechanical fall at home with worsening weakness and prior history of left-sided hemiparesis with CVA.  Discharge  Instructions  Discharge Instructions     Diet - low sodium heart healthy   Complete by: As directed    Diet - low sodium heart healthy   Complete by: As directed    Discharge instructions   Complete by: As directed    It was pleasure taking care of you. You have been given some Voltaren  gel to use as needed on affected areas. Please keep yourself well-hydrated. Please continue with your home medications and follow-up closely with your doctors for further assistance   Increase activity slowly   Complete by: As directed    Increase activity slowly   Complete by: As directed       Allergies as of 12/07/2023   No Known Allergies      Medication List     TAKE these medications    amLODipine  10 MG tablet Commonly known as: NORVASC  Take 1 tablet (10 mg total) by mouth daily.   aspirin  325 MG tablet Take 325 mg by mouth daily.   cholecalciferol 1000 units tablet Commonly known as: VITAMIN D  Take 1,000 Units by mouth daily.   diclofenac  Sodium 1 % Gel Commonly known as: VOLTAREN  Apply 2 g topically 4 (four) times daily.   fesoterodine  8 MG Tb24 tablet Commonly known as: TOVIAZ  Take 1 tablet (8 mg total) by mouth daily.   fluticasone  50 MCG/ACT nasal spray Commonly known as: FLONASE  SHAKE LIQUID AND USE 2 SPRAYS IN EACH NOSTRIL EVERY DAY What changed: See the new instructions.   Gerhardt's butt cream Crea Apply 1 Application topically daily. What changed: additional instructions   labetalol  200 MG tablet  Commonly known as: NORMODYNE  Take 1 tablet (200 mg total) by mouth 2 (two) times daily.   Lantus  SoloStar 100 UNIT/ML Solostar Pen Generic drug: insulin  glargine ADMINISTER 40 UNITS UNDER THE SKIN AT BEDTIME What changed:  how much to take how to take this when to take this additional instructions   lidocaine  5 % Commonly known as: LIDODERM  Place 1 patch onto the skin daily. Remove & Discard patch within 12 hours or as directed by MD What changed:  additional instructions   magnesium  oxide 400 (240 Mg) MG tablet Commonly known as: MAG-OX Take 1 tablet (400 mg total) by mouth 2 (two) times daily.   metFORMIN  1000 MG tablet Commonly known as: GLUCOPHAGE  Take 1,000 mg by mouth 2 (two) times daily with a meal.   omeprazole 20 MG capsule Commonly known as: PRILOSEC Take 20 mg by mouth daily.   pregabalin  50 MG capsule Commonly known as: LYRICA  Take 1 capsule (50 mg total) by mouth 2 (two) times daily.   ramipril  10 MG capsule Commonly known as: ALTACE  Take 10 mg by mouth 2 (two) times daily.   simvastatin  40 MG tablet Commonly known as: ZOCOR  Take 1 tablet (40 mg total) by mouth at bedtime.   Trulicity  4.5 MG/0.5ML Soaj Generic drug: Dulaglutide  Inject 4.5 mg as directed once a week. Sundays What changed:  how to take this when to take this additional instructions        Contact information for follow-up providers     [facility], [facility]. Schedule an appointment as soon as possible for a visit in 1 week(s).   Specialty: Internal Medicine Contact information: [address] [place] [address] [phone]         [facility] Mackintosh, [facility]. Schedule an appointment as soon as possible for a visit in 1 week(s).   Specialty: Internal Medicine Contact information: [address] [place] [address] [phone]              Contact information for after-discharge care     Destination     [facility] Preferred SNF .   Service: Skilled Nursing Contact information: [address] [facility] [place]  [address] [phone]                    No Known Allergies  Consultations: None   Procedures/Studies: DG Knee Complete 4 Views Right Result Date: 12/03/2023 CLINICAL DATA:  Ground level fall. EXAM: RIGHT KNEE - COMPLETE 4+ VIEW COMPARISON:  None Available. FINDINGS: No evidence of acute fracture or dislocation. A chronic appearing deformity is seen  along the medial aspect of the distal right femur, at the level of the medial femoral condyle. Tricompartmental degenerative changes are present. A very small suprapatellar effusion is suspected. IMPRESSION: 1. No acute fracture or dislocation. 2. Chronic appearing deformity along the medial aspect of the distal right femur. Correlation with physical examination is recommended to determine the presence of point tenderness. 3. Tricompartmental degenerative changes. Electronically Signed   By: Virgle Grime M.D.   On: 12/03/2023 03:05   DG Knee Complete 4 Views Left Result Date: 12/03/2023 CLINICAL DATA:  Ground level fall. EXAM: LEFT KNEE - COMPLETE 4+ VIEW COMPARISON:  None Available. FINDINGS: No evidence of an acute fracture or dislocation. Tricompartmental degenerative changes are noted. A very small suprapatellar effusion is suspected. IMPRESSION: 1. Tricompartmental degenerative changes. 2. Very small suprapatellar effusion. Electronically Signed   By: Elizabeth Gulling.D.  On: 12/03/2023 03:05   DG Chest Port 1 View Result Date: 12/03/2023 CLINICAL DATA:  Ground level fall. EXAM: PORTABLE CHEST 1 VIEW COMPARISON:  October 30, 2023 FINDINGS: The cardiac silhouette is mildly enlarged and unchanged in size. Mild atelectasis is seen within the left lung base. No pleural effusion or pneumothorax is identified. Chronic and degenerative changes are seen involving the left shoulder. IMPRESSION: Mild left basilar atelectasis. Electronically Signed   By: Virgle Grime M.D.   On: 12/03/2023 03:01     Discharge Exam: Vitals:   12/06/23 2128 12/07/23 0424  BP: (!) 142/43 (!) 128/42  Pulse: 62 65  Resp: 20 20  Temp: 98.3 F (36.8 C) 98.8 F (37.1 C)  SpO2: 96% 96%   Vitals:   12/06/23 1447 12/06/23 1448 12/06/23 2128 12/07/23 0424  BP: (!) 114/42 (!) 129/52 (!) 142/43 (!) 128/42  Pulse: (!) 50  62 65  Resp: 20  20 20   Temp: 97.6 F (36.4 C)  98.3 F (36.8 C) 98.8 F (37.1 C)  TempSrc:  Oral  Oral Oral  SpO2: 100%  96% 96%  Weight:        General: Pt is alert, awake, not in acute distress Cardiovascular: RRR, S1/S2 +, no rubs, no gallops Respiratory: CTA bilaterally, no wheezing, no rhonchi Abdominal: Soft, NT, ND, bowel sounds + Extremities: no edema, no cyanosis    The results of significant diagnostics from this hospitalization (including imaging, microbiology, ancillary and laboratory) are listed below for reference.     Microbiology: Recent Results (from the past 240 hours)  Urine Culture     Status: Abnormal   Collection Time: 12/03/23  3:29 AM   Specimen: Urine, Random  Result Value Ref Range Status   Specimen Description   Final    URINE, RANDOM Performed at [facility], [address] [place], [place] [address]    Special Requests   Final    NONE Reflexed from [id number] Performed at [facility], [address], [place], [place] [address]    Culture (A)  Final    >=100,000 COLONIES/mL LACTOBACILLUS SPECIES Standardized susceptibility testing for this organism is not available. Performed at [facility], [address] [place], [place] [address]    Report Status 12/04/2023 FINAL  Final     Labs: BNP (last 3 results) Recent Labs    10/30/23 1253  BNP 533.0*   Basic Metabolic Panel: Recent Labs  Lab 12/03/23 0019 12/04/23 0453 12/05/23 0458  NA 134* 138 136  K 5.0 4.0 4.0  CL 103 103 105  CO2 23 22 24   GLUCOSE 309* 243* 181*  BUN 26* 15 18  CREATININE 1.37* 1.14* 1.27*  CALCIUM 8.5* 9.1 9.0  MG  --   --  2.0  PHOS  --   --  4.3   Liver Function Tests: Recent Labs  Lab 12/05/23 0458  ALBUMIN 3.1*   No results for input(s): "LIPASE", "AMYLASE" in the last 168 hours. No results for input(s): "AMMONIA" in the last 168 hours. CBC: Recent Labs  Lab 12/03/23 0019 12/04/23 0453 12/05/23 0458  WBC 12.8* 8.0 8.2  NEUTROABS 10.9*  --   --   HGB 8.3* 8.3* 8.4*  HCT 28.0* 27.3* 28.1*  MCV 83.6 81.7 83.4  PLT 248 252 278    Cardiac Enzymes: No results for input(s): "CKTOTAL", "CKMB", "CKMBINDEX", "TROPONINI" in the last 168 hours. BNP: Invalid input(s): "POCBNP" CBG: Recent Labs  Lab 12/06/23 0808 12/06/23 1300 12/06/23 1702 12/06/23 2121  12/07/23 0803  GLUCAP 191* 298* 226* 206* 171*   D-Dimer No results for input(s): "DDIMER" in the last 72 hours. Hgb A1c No results for input(s): "HGBA1C" in the last 72 hours. Lipid Profile No results for input(s): "CHOL", "HDL", "LDLCALC", "TRIG", "CHOLHDL", "LDLDIRECT" in the last 72 hours. Thyroid  function studies No results for input(s): "TSH", "T4TOTAL", "T3FREE", "THYROIDAB" in the last 72 hours.  Invalid input(s): "FREET3" Anemia work up No results for input(s): "VITAMINB12", "FOLATE", "FERRITIN", "TIBC", "IRON", "RETICCTPCT" in the last 72 hours. Urinalysis    Component Value Date/Time   COLORURINE YELLOW 12/03/2023 0329   APPEARANCEUR HAZY (A) 12/03/2023 0329   APPEARANCEUR Cloudy (A) 11/25/2022 1655   LABSPEC 1.013 12/03/2023 0329   PHURINE 5.0 12/03/2023 0329   GLUCOSEU >=500 (A) 12/03/2023 0329   HGBUR NEGATIVE 12/03/2023 0329   BILIRUBINUR NEGATIVE 12/03/2023 0329   BILIRUBINUR Negative 11/25/2022 1655   KETONESUR NEGATIVE 12/03/2023 0329   PROTEINUR 30 (A) 12/03/2023 0329   NITRITE NEGATIVE 12/03/2023 0329   LEUKOCYTESUR SMALL (A) 12/03/2023 0329   Sepsis Labs Recent Labs  Lab 12/03/23 0019 12/04/23 0453 12/05/23 0458  WBC 12.8* 8.0 8.2   Microbiology Recent Results (from the past 240 hours)  Urine Culture     Status: Abnormal   Collection Time: 12/03/23  3:29 AM   Specimen: Urine, Random  Result Value Ref Range Status   Specimen Description   Final    URINE, RANDOM Performed at [facility], [address] [place], [place] [address]    Special Requests   Final    NONE Reflexed from [id number] Performed at [facility], [address] [place], [place] [address]    Culture (A)  Final    >=100,000 COLONIES/mL LACTOBACILLUS  SPECIES Standardized susceptibility testing for this organism is not available. Performed at [facility], [address], [place], [place] [address]    Report Status 12/04/2023 FINAL  Final     Time coordinating discharge: 35 minutes  SIGNED:   Cornelius Dill, DO [facility] 12/07/2023, 9:50 AM  If 7PM-7AM, please contact night-coverage [email or web address]

## 2023-12-07 NOTE — Plan of Care (Signed)
 Problem: Education: Goal: Knowledge of General Education information will improve Description: Including pain rating scale, medication(s)/side effects and non-pharmacologic comfort measures 12/07/2023 1023 by Goldie Later, RN Outcome: Adequate for Discharge 12/07/2023 0752 by Goldie Later, RN Outcome: Progressing   Problem: Health Behavior/Discharge Planning: Goal: Ability to manage health-related needs will improve 12/07/2023 1023 by Goldie Later, RN Outcome: Adequate for Discharge 12/07/2023 0752 by Goldie Later, RN Outcome: Progressing   Problem: Clinical Measurements: Goal: Ability to maintain clinical measurements within normal limits will improve 12/07/2023 1023 by Goldie Later, RN Outcome: Adequate for Discharge 12/07/2023 0752 by Goldie Later, RN Outcome: Progressing Goal: Will remain free from infection 12/07/2023 1023 by Goldie Later, RN Outcome: Adequate for Discharge 12/07/2023 0752 by Goldie Later, RN Outcome: Progressing Goal: Diagnostic test results will improve 12/07/2023 1023 by Goldie Later, RN Outcome: Adequate for Discharge 12/07/2023 0752 by Goldie Later, RN Outcome: Progressing Goal: Respiratory complications will improve 12/07/2023 1023 by Goldie Later, RN Outcome: Adequate for Discharge 12/07/2023 0752 by Goldie Later, RN Outcome: Progressing Goal: Cardiovascular complication will be avoided 12/07/2023 1023 by Goldie Later, RN Outcome: Adequate for Discharge 12/07/2023 0752 by Goldie Later, RN Outcome: Progressing   Problem: Activity: Goal: Risk for activity intolerance will decrease 12/07/2023 1023 by Goldie Later, RN Outcome: Adequate for Discharge 12/07/2023 0752 by Goldie Later, RN Outcome: Progressing   Problem: Nutrition: Goal: Adequate nutrition will be maintained 12/07/2023 1023 by Goldie Later, RN Outcome: Adequate for Discharge 12/07/2023 0752 by Goldie Later, RN Outcome: Progressing   Problem: Coping: Goal: Level of anxiety  will decrease 12/07/2023 1023 by Goldie Later, RN Outcome: Adequate for Discharge 12/07/2023 0752 by Goldie Later, RN Outcome: Progressing   Problem: Elimination: Goal: Will not experience complications related to bowel motility 12/07/2023 1023 by Goldie Later, RN Outcome: Adequate for Discharge 12/07/2023 0752 by Goldie Later, RN Outcome: Progressing Goal: Will not experience complications related to urinary retention 12/07/2023 1023 by Goldie Later, RN Outcome: Adequate for Discharge 12/07/2023 0752 by Goldie Later, RN Outcome: Progressing   Problem: Pain Managment: Goal: General experience of comfort will improve and/or be controlled 12/07/2023 1023 by Goldie Later, RN Outcome: Adequate for Discharge 12/07/2023 0752 by Goldie Later, RN Outcome: Progressing   Problem: Safety: Goal: Ability to remain free from injury will improve 12/07/2023 1023 by Goldie Later, RN Outcome: Adequate for Discharge 12/07/2023 0752 by Goldie Later, RN Outcome: Progressing   Problem: Skin Integrity: Goal: Risk for impaired skin integrity will decrease 12/07/2023 1023 by Goldie Later, RN Outcome: Adequate for Discharge 12/07/2023 0752 by Goldie Later, RN Outcome: Progressing   Problem: Education: Goal: Ability to describe self-care measures that may prevent or decrease complications (Diabetes Survival Skills Education) will improve 12/07/2023 1023 by Goldie Later, RN Outcome: Adequate for Discharge 12/07/2023 0752 by Goldie Later, RN Outcome: Progressing Goal: Individualized Educational Video(s) 12/07/2023 1023 by Goldie Later, RN Outcome: Adequate for Discharge 12/07/2023 0752 by Goldie Later, RN Outcome: Progressing   Problem: Coping: Goal: Ability to adjust to condition or change in health will improve 12/07/2023 1023 by Goldie Later, RN Outcome: Adequate for Discharge 12/07/2023 0752 by Goldie Later, RN Outcome: Progressing   Problem: Fluid Volume: Goal: Ability to maintain  a balanced intake and output will improve 12/07/2023 1023 by Goldie Later, RN Outcome: Adequate for Discharge 12/07/2023  4782 by Goldie Later, RN Outcome: Progressing   Problem: Health Behavior/Discharge Planning: Goal: Ability to identify and utilize available resources and services will improve 12/07/2023 1023 by Goldie Later, RN Outcome: Adequate for Discharge 12/07/2023 0752 by Goldie Later, RN Outcome: Progressing Goal: Ability to manage health-related needs will improve 12/07/2023 1023 by Goldie Later, RN Outcome: Adequate for Discharge 12/07/2023 0752 by Goldie Later, RN Outcome: Progressing   Problem: Metabolic: Goal: Ability to maintain appropriate glucose levels will improve 12/07/2023 1023 by Goldie Later, RN Outcome: Adequate for Discharge 12/07/2023 0752 by Goldie Later, RN Outcome: Progressing   Problem: Nutritional: Goal: Maintenance of adequate nutrition will improve 12/07/2023 1023 by Goldie Later, RN Outcome: Adequate for Discharge 12/07/2023 0752 by Goldie Later, RN Outcome: Progressing Goal: Progress toward achieving an optimal weight will improve 12/07/2023 1023 by Goldie Later, RN Outcome: Adequate for Discharge 12/07/2023 0752 by Goldie Later, RN Outcome: Progressing   Problem: Skin Integrity: Goal: Risk for impaired skin integrity will decrease 12/07/2023 1023 by Goldie Later, RN Outcome: Adequate for Discharge 12/07/2023 0752 by Goldie Later, RN Outcome: Progressing   Problem: Tissue Perfusion: Goal: Adequacy of tissue perfusion will improve 12/07/2023 1023 by Goldie Later, RN Outcome: Adequate for Discharge 12/07/2023 0752 by Goldie Later, RN Outcome: Progressing

## 2023-12-07 NOTE — Plan of Care (Signed)

## 2023-12-07 NOTE — Plan of Care (Signed)

## 2023-12-07 NOTE — Progress Notes (Signed)
 Report called to [facility] and received by Ivette Marks. Discharge pending transport.

## 2023-12-29 ENCOUNTER — Other Ambulatory Visit: Payer: Self-pay | Admitting: Adult Health

## 2023-12-29 DIAGNOSIS — N3281 Overactive bladder: Secondary | ICD-10-CM

## 2024-01-12 ENCOUNTER — Ambulatory Visit: Admitting: Podiatry

## 2024-05-28 ENCOUNTER — Other Ambulatory Visit: Payer: Self-pay | Admitting: Adult Health
